# Patient Record
Sex: Male | Born: 1965
Health system: Southern US, Community
[De-identification: ages and names within clinical notes are randomized; demographics above are authoritative.]

## PROBLEM LIST (undated history)

## (undated) DIAGNOSIS — F329 Major depressive disorder, single episode, unspecified: Secondary | ICD-10-CM

## (undated) DIAGNOSIS — M1990 Unspecified osteoarthritis, unspecified site: Secondary | ICD-10-CM

## (undated) DIAGNOSIS — F1910 Other psychoactive substance abuse, uncomplicated: Secondary | ICD-10-CM

## (undated) DIAGNOSIS — C801 Malignant (primary) neoplasm, unspecified: Secondary | ICD-10-CM

## (undated) DIAGNOSIS — Z9889 Other specified postprocedural states: Secondary | ICD-10-CM

## (undated) DIAGNOSIS — F319 Bipolar disorder, unspecified: Secondary | ICD-10-CM

## (undated) DIAGNOSIS — G9340 Encephalopathy, unspecified: Secondary | ICD-10-CM

## (undated) DIAGNOSIS — F1011 Alcohol abuse, in remission: Secondary | ICD-10-CM

## (undated) DIAGNOSIS — R112 Nausea with vomiting, unspecified: Secondary | ICD-10-CM

## (undated) DIAGNOSIS — D696 Thrombocytopenia, unspecified: Secondary | ICD-10-CM

## (undated) DIAGNOSIS — K469 Unspecified abdominal hernia without obstruction or gangrene: Secondary | ICD-10-CM

## (undated) DIAGNOSIS — E785 Hyperlipidemia, unspecified: Secondary | ICD-10-CM

## (undated) DIAGNOSIS — R569 Unspecified convulsions: Secondary | ICD-10-CM

## (undated) DIAGNOSIS — I8500 Esophageal varices without bleeding: Secondary | ICD-10-CM

## (undated) DIAGNOSIS — D649 Anemia, unspecified: Secondary | ICD-10-CM

## (undated) DIAGNOSIS — F32A Depression, unspecified: Secondary | ICD-10-CM

## (undated) DIAGNOSIS — S62109A Fracture of unspecified carpal bone, unspecified wrist, initial encounter for closed fracture: Secondary | ICD-10-CM

## (undated) HISTORY — DX: Wilson's disease: E83.01

## (undated) HISTORY — DX: Unspecified abdominal hernia without obstruction or gangrene: K46.9

## (undated) HISTORY — DX: Other psychoactive substance abuse, uncomplicated: F19.10

## (undated) HISTORY — DX: Alcohol abuse, in remission: F10.11

## (undated) HISTORY — DX: Hyperlipidemia, unspecified: E78.5

## (undated) HISTORY — PX: OTHER SURGICAL HISTORY: SHX169

## (undated) HISTORY — DX: Thrombocytopenia, unspecified: D69.6

## (undated) HISTORY — DX: Depression, unspecified: F32.A

## (undated) HISTORY — DX: Encephalopathy, unspecified: G93.40

## (undated) HISTORY — DX: Bipolar disorder, unspecified: F31.9

## (undated) HISTORY — DX: Major depressive disorder, single episode, unspecified: F32.9

## (undated) HISTORY — DX: Unspecified convulsions: R56.9

## (undated) HISTORY — PX: VASCULAR SURGERY: SHX849

## (undated) HISTORY — PX: ESOPHAGOGASTRODUODENOSCOPY: SHX1529

---

## 2007-12-24 ENCOUNTER — Emergency Department (HOSPITAL_COMMUNITY): Admission: EM | Admit: 2007-12-24 | Discharge: 2007-12-25 | Payer: Self-pay | Admitting: Emergency Medicine

## 2008-11-29 ENCOUNTER — Emergency Department (HOSPITAL_COMMUNITY): Admission: EM | Admit: 2008-11-29 | Discharge: 2008-11-29 | Payer: Self-pay | Admitting: Emergency Medicine

## 2009-06-10 DIAGNOSIS — K7460 Unspecified cirrhosis of liver: Secondary | ICD-10-CM | POA: Insufficient documentation

## 2009-06-10 DIAGNOSIS — K769 Liver disease, unspecified: Secondary | ICD-10-CM | POA: Insufficient documentation

## 2010-09-16 ENCOUNTER — Inpatient Hospital Stay (HOSPITAL_COMMUNITY)
Admission: EM | Admit: 2010-09-16 | Discharge: 2010-09-18 | Payer: Self-pay | Source: Home / Self Care | Attending: Internal Medicine | Admitting: Internal Medicine

## 2010-09-19 LAB — DIFFERENTIAL
Basophils Absolute: 0 10*3/uL (ref 0.0–0.1)
Basophils Absolute: 0 10*3/uL (ref 0.0–0.1)
Basophils Relative: 0 % (ref 0–1)
Basophils Relative: 0 % (ref 0–1)
Eosinophils Absolute: 0 10*3/uL (ref 0.0–0.7)
Eosinophils Absolute: 0.1 10*3/uL (ref 0.0–0.7)
Eosinophils Relative: 0 % (ref 0–5)
Eosinophils Relative: 1 % (ref 0–5)
Lymphocytes Relative: 12 % (ref 12–46)
Lymphocytes Relative: 5 % — ABNORMAL LOW (ref 12–46)
Lymphs Abs: 0.7 10*3/uL (ref 0.7–4.0)
Lymphs Abs: 1.2 10*3/uL (ref 0.7–4.0)
Monocytes Absolute: 1.2 10*3/uL — ABNORMAL HIGH (ref 0.1–1.0)
Monocytes Absolute: 1.6 10*3/uL — ABNORMAL HIGH (ref 0.1–1.0)
Monocytes Relative: 12 % (ref 3–12)
Monocytes Relative: 13 % — ABNORMAL HIGH (ref 3–12)
Neutro Abs: 11.1 10*3/uL — ABNORMAL HIGH (ref 1.7–7.7)
Neutro Abs: 7 10*3/uL (ref 1.7–7.7)
Neutrophils Relative %: 74 % (ref 43–77)
Neutrophils Relative %: 83 % — ABNORMAL HIGH (ref 43–77)

## 2010-09-19 LAB — CBC
HCT: 38.4 % — ABNORMAL LOW (ref 39.0–52.0)
HCT: 42.9 % (ref 39.0–52.0)
Hemoglobin: 13 g/dL (ref 13.0–17.0)
Hemoglobin: 14.6 g/dL (ref 13.0–17.0)
MCH: 33.9 pg (ref 26.0–34.0)
MCH: 34 pg (ref 26.0–34.0)
MCHC: 33.9 g/dL (ref 30.0–36.0)
MCHC: 34 g/dL (ref 30.0–36.0)
MCV: 100 fL (ref 78.0–100.0)
MCV: 100.3 fL — ABNORMAL HIGH (ref 78.0–100.0)
Platelets: 67 10*3/uL — ABNORMAL LOW (ref 150–400)
Platelets: 68 10*3/uL — ABNORMAL LOW (ref 150–400)
RBC: 3.83 MIL/uL — ABNORMAL LOW (ref 4.22–5.81)
RBC: 4.29 MIL/uL (ref 4.22–5.81)
RDW: 15.6 % — ABNORMAL HIGH (ref 11.5–15.5)
RDW: 15.7 % — ABNORMAL HIGH (ref 11.5–15.5)
WBC: 13.4 10*3/uL — ABNORMAL HIGH (ref 4.0–10.5)
WBC: 9.5 10*3/uL (ref 4.0–10.5)

## 2010-09-19 LAB — COMPREHENSIVE METABOLIC PANEL
ALT: 36 U/L (ref 0–53)
AST: 46 U/L — ABNORMAL HIGH (ref 0–37)
Albumin: 2.8 g/dL — ABNORMAL LOW (ref 3.5–5.2)
Alkaline Phosphatase: 111 U/L (ref 39–117)
BUN: 9 mg/dL (ref 6–23)
CO2: 24 mEq/L (ref 19–32)
Calcium: 9.5 mg/dL (ref 8.4–10.5)
Chloride: 106 mEq/L (ref 96–112)
Creatinine, Ser: 0.83 mg/dL (ref 0.4–1.5)
GFR calc Af Amer: >60 mL/min (ref >60)
GFR calc non Af Amer: >60 mL/min (ref >60)
Glucose, Bld: 112 mg/dL — ABNORMAL HIGH (ref 70–99)
Potassium: 3.9 mEq/L (ref 3.5–5.1)
Sodium: 137 mEq/L (ref 135–145)
Total Bilirubin: 3.9 mg/dL — ABNORMAL HIGH (ref 0.3–1.2)
Total Protein: 7.4 g/dL (ref 6.0–8.3)

## 2010-09-19 LAB — URINALYSIS, ROUTINE W REFLEX MICROSCOPIC
Hgb urine dipstick: NEGATIVE
Ketones, ur: 15 mg/dL — AB
Nitrite: POSITIVE — AB
Protein, ur: NEGATIVE mg/dL
Specific Gravity, Urine: 1.038 — ABNORMAL HIGH (ref 1.005–1.030)
Urine Glucose, Fasting: NEGATIVE mg/dL
Urobilinogen, UA: 1 mg/dL (ref 0.0–1.0)
pH: 6 (ref 5.0–8.0)

## 2010-09-19 LAB — URINE MICROSCOPIC-ADD ON

## 2010-09-19 LAB — BASIC METABOLIC PANEL
BUN: 10 mg/dL (ref 6–23)
CO2: 25 mEq/L (ref 19–32)
Calcium: 9 mg/dL (ref 8.4–10.5)
Chloride: 104 mEq/L (ref 96–112)
Creatinine, Ser: 0.9 mg/dL (ref 0.4–1.5)
GFR calc Af Amer: >60 mL/min (ref >60)
GFR calc non Af Amer: >60 mL/min (ref >60)
Glucose, Bld: 112 mg/dL — ABNORMAL HIGH (ref 70–99)
Potassium: 4.7 mEq/L (ref 3.5–5.1)
Sodium: 135 mEq/L (ref 135–145)

## 2010-09-19 LAB — PROTIME-INR
INR: 1.48 (ref 0.00–1.49)
Prothrombin Time: 18.1 seconds — ABNORMAL HIGH (ref 11.6–15.2)

## 2010-09-19 LAB — OCCULT BLOOD, POC DEVICE: Fecal Occult Bld: NEGATIVE

## 2010-09-19 LAB — LIPASE, BLOOD: Lipase: 40 U/L (ref 11–59)

## 2010-09-19 LAB — APTT: aPTT: 32 seconds (ref 24–37)

## 2010-09-23 NOTE — Discharge Summary (Signed)
[name], [name]                ACCOUNT NO.:  [id number]  MEDICAL RECORD NO.:  [id number]          PATIENT TYPE:  INP  LOCATION:  1345                         FACILITY:  [facility]  PHYSICIAN:  Ruthy Dick, MD    DATE OF BIRTH:  02-Sep-1966  DATE OF ADMISSION:  09/16/2010 DATE OF DISCHARGE:  09/18/2010                              DISCHARGE SUMMARY   PRIMARY CARE PHYSICIAN:  The patient does not have primary care physician in the area but the patient's gastroenterologist is in [facility] with the name Dr. Woodfin Ganja.  REASON FOR ADMISSION:  Abdominal pain, nausea, and vomiting.  FINAL DISCHARGE DIAGNOSES: 1. Abdominal pain, nausea and vomiting, likely secondary to ileus     versus incarcerated inguinal hernia. 2. Left inguinal hernia, reducible. 3. Ascites. 4. Wilson's disease. 5. Thrombocytopenia secondary to cirrhosis and a previous medical     treatment of penicillamine. 6. Seizure disorder. 7. Urinary tract infection.  PROCEDURES DONE DURING THIS ADMISSION:  CT scan of the abdomen and pelvis which showed cirrhosis with splenomegaly and significant ascites as well as presence of spontaneous splenorenal shunt, cholelithiasis, and left inguinal hernia.  Abdominal x-ray was read as having air-fluid levels in the nondilated small bowel in the right, questionable for local ileus.  CONSULTS DURING THIS ADMISSION:  None, specifically I offered this to have consult for Gastroenterology and also surgical consult but he refused, and said since his symptoms have improved that he will rather follow with his appointment at [facility], so at this admission, I did not go for this consult.  HISTORY OF PRESENT ILLNESS:  This is a 45 year old gentleman with a history of Wilson's disease, who presented to the Emergency Room with abdominal pain and vomiting.  There was also pain in the left inguinal hernia area.  The patient was admitted and was  treated empirically.  CT scan of the abdomen did not show any particular obstruction.  However, because there was a concern for incarceration of hernia, we offered to have Surgery see this patient, but again he refused because by this time his symptoms were much improved.  He also mentioned the fact that because of ascites, he is not going to be a good candidate for surgery which is what he has been told from [facility] [place] [place].  According to him, he is on the transplant list for his liver; and according to him, the idea was for him to have his ascites either reduced before going for surgery.  Again, as noted his symptoms have improved.  He is able to tolerate diet.  He has been having bowel movements and he has been very anxious to be discharged home.  He says he has no reason to remain in the hospital since he does not want to follow with any surgeon or gastroenterologist here.  Because of this, we feel that he is stable enough to be discharged home.  PHYSICAL EXAMINATION:  VITAL SIGNS:  Temperature is 98.5, pulse 72, respirations 16, blood pressure 105/60, saturating 94% on room air. CHEST:  Clear to auscultation bilaterally. ABDOMEN:  Slightly  distended but not tense.  Bowel sounds present. Ascites present. EXTREMITIES:  No clubbing, no signs, no edema. CARDIOVASCULAR:  Present first and second heart sounds only. CENTRAL NERVOUS SYSTEM:  Nonfocal.  DISCHARGE MEDICATIONS:  For this patient are: 1. Rifaximin 550 mg b.i.d. 2. Lactulose as instructed. 3. Keppra 500 mg b.i.d. 4. Seroquel 100 1 tablet in the morning and 150 mg at bedtime. 5. Aldactone 25 mg 4 tablets in the morning and 2 tablets afternoon. 6. Zinc 50 mg t.i.d. 7. Tylenol 500 mg q.6h. p.r.n. for pain. 8. Omeprazole 40 mg p.o. daily. 9. Augmentin 875 mg p.o. b.i.d. for 3 days.  Total time used for discharging this patient is 40 minutes.  Plan of care discussed at length with the patient and he  voices understand plans to comply.  He is to follow with the [facility] as scheduled.  According to him, this is in about 2 weeks.  He is also to follow with [facility] as also scheduled.  He also plans to call and confirm this appointment.  Total time used for discharging the patient is 40 minutes.  Prior to discharging, we are going to get a KUB of his abdomen and see whether his ileus is resolved as image-wise, otherwise in terms of his symptoms, he has no symptoms whatsoever.  No abdominal pain, no nausea, no vomiting, no diarrhea.  He is eating and tolerating his diet and is having bowel movement.     Ruthy Dick, MD     GU/MEDQ  D:  09/18/2010  T:  09/18/2010  Job:  [id number]  cc:   Dr. Woodfin Ganja [facility].  Electronically Signed by Ruthy Dick  on 09/22/2010 06:54:26 PM

## 2010-11-27 ENCOUNTER — Inpatient Hospital Stay (HOSPITAL_COMMUNITY)
Admission: EM | Admit: 2010-11-27 | Discharge: 2010-11-29 | DRG: 100 | Disposition: A | Discharge status: Home / Self Care | Payer: Medicare HMO | Attending: Internal Medicine | Admitting: Internal Medicine

## 2010-11-27 DIAGNOSIS — E7220 Disorder of urea cycle metabolism, unspecified: Secondary | ICD-10-CM | POA: Diagnosis present

## 2010-11-27 DIAGNOSIS — K7682 Hepatic encephalopathy: Secondary | ICD-10-CM | POA: Diagnosis present

## 2010-11-27 DIAGNOSIS — G40802 Other epilepsy, not intractable, without status epilepticus: Principal | ICD-10-CM | POA: Diagnosis present

## 2010-11-27 DIAGNOSIS — K7290 Hepatic failure, unspecified without coma: Secondary | ICD-10-CM | POA: Diagnosis present

## 2010-11-27 DIAGNOSIS — Z91199 Patient's noncompliance with other medical treatment and regimen due to unspecified reason: Secondary | ICD-10-CM

## 2010-11-27 DIAGNOSIS — D696 Thrombocytopenia, unspecified: Secondary | ICD-10-CM | POA: Diagnosis present

## 2010-11-27 DIAGNOSIS — Z7682 Awaiting organ transplant status: Secondary | ICD-10-CM

## 2010-11-27 DIAGNOSIS — K769 Liver disease, unspecified: Secondary | ICD-10-CM | POA: Diagnosis present

## 2010-11-27 DIAGNOSIS — Z9119 Patient's noncompliance with other medical treatment and regimen: Secondary | ICD-10-CM

## 2010-11-27 DIAGNOSIS — F17200 Nicotine dependence, unspecified, uncomplicated: Secondary | ICD-10-CM | POA: Diagnosis present

## 2010-11-27 LAB — CBC
HCT: 42.1 % (ref 39.0–52.0)
Hemoglobin: 14.2 g/dL (ref 13.0–17.0)
MCH: 33.8 pg (ref 26.0–34.0)
MCHC: 33.7 g/dL (ref 30.0–36.0)
MCV: 100.2 fL — ABNORMAL HIGH (ref 78.0–100.0)
Platelets: 94 10*3/uL — ABNORMAL LOW (ref 150–400)
RBC: 4.2 MIL/uL — ABNORMAL LOW (ref 4.22–5.81)
RDW: 15.4 % (ref 11.5–15.5)
WBC: 9.2 10*3/uL (ref 4.0–10.5)

## 2010-11-27 LAB — DIFFERENTIAL
Basophils Absolute: 0.1 10*3/uL (ref 0.0–0.1)
Basophils Relative: 1 % (ref 0–1)
Eosinophils Absolute: 0.1 10*3/uL (ref 0.0–0.7)
Eosinophils Relative: 1 % (ref 0–5)
Lymphocytes Relative: 22 % (ref 12–46)
Lymphs Abs: 2 10*3/uL (ref 0.7–4.0)
Monocytes Absolute: 1.1 10*3/uL — ABNORMAL HIGH (ref 0.1–1.0)
Monocytes Relative: 12 % (ref 3–12)
Neutro Abs: 5.9 10*3/uL (ref 1.7–7.7)
Neutrophils Relative %: 64 % (ref 43–77)

## 2010-11-27 LAB — COMPREHENSIVE METABOLIC PANEL
ALT: 37 U/L (ref 0–53)
AST: 55 U/L — ABNORMAL HIGH (ref 0–37)
Albumin: 3 g/dL — ABNORMAL LOW (ref 3.5–5.2)
Alkaline Phosphatase: 109 U/L (ref 39–117)
BUN: 11 mg/dL (ref 6–23)
CO2: 14 mEq/L — ABNORMAL LOW (ref 19–32)
Calcium: 9.4 mg/dL (ref 8.4–10.5)
Chloride: 108 mEq/L (ref 96–112)
Creatinine, Ser: 0.82 mg/dL (ref 0.4–1.5)
GFR calc Af Amer: >60 mL/min (ref >60)
GFR calc non Af Amer: >60 mL/min (ref >60)
Glucose, Bld: 106 mg/dL — ABNORMAL HIGH (ref 70–99)
Potassium: 4.1 mEq/L (ref 3.5–5.1)
Sodium: 137 mEq/L (ref 135–145)
Total Bilirubin: 3.1 mg/dL — ABNORMAL HIGH (ref 0.3–1.2)
Total Protein: 7.1 g/dL (ref 6.0–8.3)

## 2010-11-27 LAB — AMMONIA: Ammonia: 243 umol/L — ABNORMAL HIGH (ref 11–35)

## 2010-11-28 LAB — APTT: aPTT: 34 seconds (ref 24–37)

## 2010-11-28 LAB — BASIC METABOLIC PANEL
BUN: 9 mg/dL (ref 6–23)
CO2: 24 mEq/L (ref 19–32)
Calcium: 8.5 mg/dL (ref 8.4–10.5)
Chloride: 108 mEq/L (ref 96–112)
Creatinine, Ser: 0.81 mg/dL (ref 0.4–1.5)
GFR calc Af Amer: >60 mL/min (ref >60)
GFR calc non Af Amer: >60 mL/min (ref >60)
Glucose, Bld: 92 mg/dL (ref 70–99)
Potassium: 3.4 mEq/L — ABNORMAL LOW (ref 3.5–5.1)
Sodium: 137 mEq/L (ref 135–145)

## 2010-11-28 LAB — TSH: TSH: 0.821 u[IU]/mL (ref 0.350–4.500)

## 2010-11-28 LAB — PROTIME-INR
INR: 1.67 — ABNORMAL HIGH (ref 0.00–1.49)
Prothrombin Time: 19.9 seconds — ABNORMAL HIGH (ref 11.6–15.2)

## 2010-11-28 LAB — MAGNESIUM: Magnesium: 1.8 mg/dL (ref 1.5–2.5)

## 2010-11-28 LAB — AMMONIA: Ammonia: 78 umol/L — ABNORMAL HIGH (ref 11–35)

## 2010-11-29 LAB — BASIC METABOLIC PANEL
BUN: 7 mg/dL (ref 6–23)
CO2: 27 mEq/L (ref 19–32)
Calcium: 9 mg/dL (ref 8.4–10.5)
Chloride: 109 mEq/L (ref 96–112)
Creatinine, Ser: 0.85 mg/dL (ref 0.4–1.5)
GFR calc Af Amer: >60 mL/min (ref >60)
GFR calc non Af Amer: >60 mL/min (ref >60)
Glucose, Bld: 94 mg/dL (ref 70–99)
Potassium: 4.3 mEq/L (ref 3.5–5.1)
Sodium: 140 mEq/L (ref 135–145)

## 2010-11-29 LAB — MAGNESIUM: Magnesium: 1.8 mg/dL (ref 1.5–2.5)

## 2010-11-29 LAB — AMMONIA: Ammonia: 35 umol/L (ref 11–35)

## 2010-12-04 NOTE — Discharge Summary (Signed)
[name], [name]                ACCOUNT NO.:  [id number]  MEDICAL RECORD NO.:  [id number]           PATIENT TYPE:  I  LOCATION:  4737                         FACILITY:  [facility]  PHYSICIAN:  Baltazar Najjar, MD     DATE OF BIRTH:  10/06/1965  DATE OF ADMISSION:  11/27/2010 DATE OF DISCHARGE:                              DISCHARGE SUMMARY   FINAL DISCHARGE DIAGNOSES: 1. Seizures. 2. History of seizure disorder. 3. Hepatic encephalopathy. 4. History of Wilson disease. 5. Chronic thrombocytopenia.  CONSULTATION:  [facility].  RADIOLOGY DATA:  None.  BRIEF ADMITTING HISTORY:  Please refer to dictated H and P for more details.  On summary [name] is a 45 year old man with history of Wilson disease and seizure disorder.  He also has history of hepatic failure, follow up at [facility] was brought into the hospital for seizure and confusion.  Apparently, the patient stopped taking his medication for no specific reason.  He denies any financial difficulty or filling his medication.  However, he was noncompliant with his medicine and that precipitated the seizure episode and hyperammonemia.  HOSPITAL COURSE:  In the ED, the patient was found to have ammonia level greater than 200.  He was admitted to the hospital with a diagnosis of seizure and hepatic encephalopathy.  The patient was resumed back on his medicine including lactulose 30 mL q.6 h. and his Keppra and his seizure resolved and his ammonia level came down with lactulose and his last level was 35.  He was also continued on his rifaximin along with lactulose during this hospitalization.  The patient's mental status significantly improved.  He is alert and oriented x3 today.  No flapping tremor noted.  He is stable for discharge to follow with his hepatologist at [facility] as previously arranged.  DISCHARGE MEDICATIONS: 1. Lactulose 10 g/15 mL 30 mL p.o. q.6 h. 2. Lasix 40 mg p.o.  q.a.m. 3. Keppra 500 mg p.o. b.i.d. 4. Milk Thistle 100 mg over-the-counter 1 tablet p.o. q.a.m. 5. Omeprazole 40 mg 1 capsule p.o. daily as needed. 6. Propranolol 10 mg p.o. b.i.d. 7. Seroquel 100 mg 1-1/2 tablets p.o. twice daily. 8. Spironolactone 100 mg p.o. daily.  Instructions for him to take 150     mg, however given the fact that his blood pressure is running in     the low side.  I will discharge him only on 100 mg p.o. daily. 9. Rifaximin 550 mg p.o. twice daily. 10.Zinc 50 mg over-the-counter t.i.d.  DISCHARGE INSTRUCTIONS: 1. The patient counseled extensively with compliance of his     medication.  I explained to him the risk of not taking his medicine     including seizures and even death.  The patient verbalized     understanding. 2. The patient to follow with [facility] as arranged. 3. The patient to report any worsening of his symptom or any new     symptoms to his doctor or come back to the ER.  CONDITION ON DISCHARGE:  Improved.          ______________________________ Baltazar Najjar, MD  SA/MEDQ  D:  11/29/2010  T:  11/29/2010  Job:  [id number]  cc:   Dr. Woodfin Ganja  Electronically Signed by Hannah Beat MD on 12/04/2010 08:48:35 PM

## 2010-12-04 NOTE — Consult Note (Signed)
[name], TRIM                ACCOUNT NO.:  [id number]  MEDICAL RECORD NO.:  [id number]           PATIENT TYPE:  I  LOCATION:  4737                         FACILITY:  [facility]  PHYSICIAN:  Levie Heritage, MD       DATE OF BIRTH:  May 23, 1966  DATE OF CONSULTATION:  11/28/2010 DATE OF DISCHARGE:                                CONSULTATION   REASON OF CONSULTATION:  Seizure.  HISTORY OF PRESENT ILLNESS:  This is a 45 year old male with known Wilson disease, hepatic failure, seizure disorder, and history of EtOH abuse.  The patient is currently on the transplant list at [facility], but has not received a donor at this point in time.  The patient has been on lactulose for hyperammonemia in the past and states he sees Dr. Anne Hahn of [facility] for seizure disorder.  The patient has been on Keppra 500 mg b.i.d. for over 2 years and states that he has been doing well on this medication and has not issues with either sideeffects or financial issues.  Over the past a week or so, the patient has had a period in which he stopped taking his medication and became very confused.  He is unsure why he stopped taking his medication. However, it seems as though his ammonia level was rising which most likely caused confusion and then at that time his medications were not being taken as scheduled.  At some point on November 27, 2010, the patient had a seizure episode that was witnessed by a roommate and was brought to [facility] ED.  After being admitted to [facility] ED, the patient's ammonia level was found to be 243.  The patient had a second seizure while in [facility] ED that lasted approximately a minute.  Unfortunately, the patient cannot describe the seizures and no one is around at this time to describe what was witnessed.  The patient presently is alert and oriented and is back to his baseline and his ammonia has dropped significantly from 243 down to 78.  PAST MEDICAL HISTORY: 1. Wilson  disease. 2. Seizure disorder. 3. Hepatic failure. 4. EtOH beats.  MEDICATIONS:  While in the hospital, the patient has been placed back on his Keppra 500 mg b.i.d., Tylenol, lactulose, Lasix, omeprazole, spironolactone, Seroquel, and propranolol.  ALLERGIES:  No known drug allergies.  FAMILY HISTORY:  The patient has 5 siblings, one sister with Andrey Campanile disease and other siblings are stated to be carriers.  SOCIAL HISTORY:  The patient smokes half pack per day.  Does not drink alcohol.  Does not do illicit drugs.  REVIEW OF SYSTEMS:  Positive for distended abdomen, seizure disorder, confusion, and asterixis.  PHYSICAL EXAMINATION:  VITAL SIGNS:  Blood pressure is 95/57, pulse 65, respiratory rate 17, and temperature 98.6. NEUROLOGIC:  The patient is alert and oriented x3, carries out 2-3 step commands without any difficulty.  Pupils are equal, round, and reactive to light.  Conjugate gaze.  Extraocular movements are intact.  Visual fields grossly intact.  Face symmetrical.  Tongue is midline.  Uvula is midline.  The patient shows no dysarthria, aphasia, or slurred speech.  Facial sensation is full to pinprick and light touch.  Shoulder shrug and head turn was within normal limits.  Coordination, finger-to-nose and heel-to-shin were smooth.  Gait, the patient's gait is slow, but smooth.  Motor, the patient shows 4/5 strength throughout.  He does have slight asterixis when his hands are held out in extension, most likely secondary to his high ammonia levels.  Deep tendon reflexes 2+ throughout.  Downgoing toes.  Drift, the patient shows no drift in the upper lower extremities.  Sensation, the patient has sensations to pinprick, light touch, and vibration throughout and are intact. PULMONARY:  Clear to auscultation bilaterally.  No rhonchi or wheezing. CARDIOVASCULAR:  S1 and S2 is audible. NECK:  Negative bruits and supple.  LABORATORY DATA:  The patient's ammonia has dropped from  243-78.  PT is 19.9 and INR 1.67.  Sodium 137, potassium 3.4, chloride 108, CO2 is 24, BUN 9, creatinine 0.81, and glucose 92.  White blood cell count 9.2, platelets 94, hemoglobin 14.2, and hematocrit 42.1.  IMAGING:  No imaging has been done at this time.  ASSESSMENT:  This is a 45 year old male with known seizure disorder who has been doing well on Keppra 500 mg b.i.d.  The patient had a 1-week history of increasing confusion (high ammonia related due to CLD) which resulted in medical noncompliance. The patient arrived at the emergency department with a ammonia level of 243 which has now dropped to 78.  The patient had one seizure before admitting and one seizure while in the emergency department.  Most likely cause of seizure is non-compliance of antiepileptic medication in the system and high ammonia levels.  RECOMMENDATIONS: 1. Restart Keppra 500 mg b.i.d. IV or p.o.  Continue with treating     underlying hyperammonemia. 2. Follow up with Dr. Anne Hahn as an outpatient for continued     antiepileptic medication reconciliation.       Felicie Morn, PA-C  I have examined the patient and agree with above clinical findings.______________________________ Levie Heritage, MD    DS/MEDQ  D:  11/28/2010  T:  11/29/2010  Job:  [id number]  Electronically Signed by Felicie Morn PA-C on 12/01/2010 02:30:00 PM Electronically Signed by Levie Heritage MD on 12/04/2010 06:38:05 PM

## 2010-12-12 NOTE — H&P (Signed)
[name], [name]                ACCOUNT NO.:  [id number]  MEDICAL RECORD NO.:  [id number]           PATIENT TYPE:  E  LOCATION:  [facility]                         FACILITY:  [facility]  PHYSICIAN:  Homero Fellers, MD   DATE OF BIRTH:  [birth year]  DATE OF ADMISSION:  11/27/2010 DATE OF DISCHARGE:                             HISTORY & PHYSICAL   PRIMARY CARE PHYSICIAN:  Unassigned.  CHIEF COMPLAINTS:  Seizure and confusion.  HISTORY OF PRESENT ILLNESS:  This is a 45 year old Caucasian gentleman with known history of Wilson disease and hepatic failure currently on the transplant list at [facility].  He presented with seizure episode one at home this afternoon and one witnessed in the emergency room.  The seizure episode at home lasted about 3 minutes and one in the emergency room about 1 minute.  He was given Ativan to abort his seizure.  The patient has been confused for the past 2-3 days according to his friend who was also at the bedside and may not have been taking his Keppra and has lactulose as issued.  The patient also had some vomiting episodes once a day and a couple yesterday.  There is no abdominal pain, fever, cough, chest pain, shortness of breath, diarrhea, or constipation.  The patient's last seizure was about a month ago. Currently, the patient appeared to be less confused and actually able to engage in good conversation.  He has not eaten much today because of his vomiting.  His past medical history includes history of Wilson disease, history of seizure disorder, history of hepatic failure, history of previous alcohol abuse.  MEDICATIONS: 1. Tylenol. 2. Lactulose. 3. Lasix 40 mg daily. 4. Omeprazole 40 mg b.i.d. 5. Spironolactone 50 mg daily. 6. __________ frequency unspecified. 7. Propranolol. 8. Seroquel.  ALLERGIES:  None.  SOCIAL HISTORY:  Smokes half pack per day.  Does not drink now but used to drink heavily in the past.  No drug use.  FAMILY  HISTORY:  His sister died of Wilson disease few years ago.  Age unspecified.  No history of coronary artery disease in the family.  Ten- point review of systems is negative except as described above.  PHYSICAL EXAMINATION:  VITAL SIGNS:  Blood pressure 110/58 to 127/72, pulse 89, respiration 20, temperature is 98.4 degrees. GENERAL:  The patient is lying in bed comfortably in no distress. HEENT:  Pallor.  It is anicteric. NECK:  Supple.  No JVD, adenopathy, or thyromegaly. LUNGS:  Clear to auscultation bilaterally.  No wheezing or crackles. HEART:  S1 and S2.  Regular rate and rhythm.  No murmurs, rubs, or gallop. ABDOMEN:  Full, soft, nontender.  Bowel sounds present.  No masses. Questionable ascites. EXTREMITIES:  Trace edema. NEUROLOGIC:  The patient is awake, alert, and oriented x3.  Speech is clear. SKIN:  No rash or lesion.  LABORATORY:  Ammonia level 243.  Sodium is 137, potassium 4.1, BUN 11, creatinine 0.82.  Liver enzymes, AST 55, AST 37, total bilirubin is 3.1, which is elevated.  CBC, white count is 9.2, hemoglobin is 14.2, platelet count is 94,000.  Coagulation profile is  pending.  IMAGING:  Unavailable at this time.  ASSESSMENT: 1. Seizure episodes likely from poor compliance with medication     because of confusion. 2. Hepatic encephalopathy in a patient with history of liver failure     and wheezing disease. 3. Tobacco abuse.  PLAN:  Admit to telemetry.  The patient will be on p.o. lactulose since he is currently awake and able to take by mouth.  He was also given Zofran for his vomiting.  I will also put him on Keppra 750 mg IV loading dose, then 250 mg p.o. b.i.d.  I have discussed with neurology team who will see the patient tomorrow.  I will also order an EEG.  His condition at this time appears fairly stable.     Homero Fellers, MD     FA/MEDQ  D:  11/27/2010  T:  11/27/2010  Job:  [id number]  Electronically Signed by Homero Fellers  on  12/12/2010 09:13:58 PM

## 2010-12-15 LAB — COMPREHENSIVE METABOLIC PANEL
ALT: 62 U/L — ABNORMAL HIGH (ref 0–53)
AST: 48 U/L — ABNORMAL HIGH (ref 0–37)
Albumin: 3.4 g/dL — ABNORMAL LOW (ref 3.5–5.2)
Alkaline Phosphatase: 86 U/L (ref 39–117)
BUN: 9 mg/dL (ref 6–23)
CO2: 26 mEq/L (ref 19–32)
Calcium: 9.1 mg/dL (ref 8.4–10.5)
Chloride: 108 mEq/L (ref 96–112)
Creatinine, Ser: 0.91 mg/dL (ref 0.4–1.5)
GFR calc Af Amer: >60 mL/min (ref >60)
GFR calc non Af Amer: >60 mL/min (ref >60)
Glucose, Bld: 83 mg/dL (ref 70–99)
Potassium: 3.7 mEq/L (ref 3.5–5.1)
Sodium: 140 mEq/L (ref 135–145)
Total Bilirubin: 1.1 mg/dL (ref 0.3–1.2)
Total Protein: 6 g/dL (ref 6.0–8.3)

## 2010-12-15 LAB — URINALYSIS, ROUTINE W REFLEX MICROSCOPIC
Bilirubin Urine: NEGATIVE
Glucose, UA: NEGATIVE mg/dL
Hgb urine dipstick: NEGATIVE
Ketones, ur: NEGATIVE mg/dL
Nitrite: NEGATIVE
Protein, ur: NEGATIVE mg/dL
Specific Gravity, Urine: 1.023 (ref 1.005–1.030)
Urobilinogen, UA: 1 mg/dL (ref 0.0–1.0)
pH: 6 (ref 5.0–8.0)

## 2010-12-15 LAB — POCT CARDIAC MARKERS
CKMB, poc: <1 ng/mL — ABNORMAL LOW (ref 1.0–8.0)
Myoglobin, poc: 164 ng/mL (ref 12–200)
Troponin i, poc: <0.05 ng/mL (ref 0.00–0.09)

## 2010-12-15 LAB — CBC
HCT: 39.8 % (ref 39.0–52.0)
Hemoglobin: 13.5 g/dL (ref 13.0–17.0)
MCHC: 33.8 g/dL (ref 30.0–36.0)
MCV: 92.2 fL (ref 78.0–100.0)
Platelets: 43 10*3/uL — CL (ref 150–400)
RBC: 4.32 MIL/uL (ref 4.22–5.81)
RDW: 15 % (ref 11.5–15.5)
WBC: 4.1 10*3/uL (ref 4.0–10.5)

## 2011-01-16 ENCOUNTER — Ambulatory Visit: Payer: Medicare HMO | Admitting: Internal Medicine

## 2011-01-23 ENCOUNTER — Ambulatory Visit: Payer: Medicare HMO | Admitting: Internal Medicine

## 2011-01-26 ENCOUNTER — Ambulatory Visit: Payer: Medicare HMO | Admitting: Internal Medicine

## 2011-02-03 HISTORY — PX: OTHER SURGICAL HISTORY: SHX169

## 2011-02-16 ENCOUNTER — Ambulatory Visit: Payer: Medicare HMO | Admitting: Internal Medicine

## 2011-02-23 ENCOUNTER — Ambulatory Visit: Payer: Medicare HMO | Admitting: Internal Medicine

## 2011-02-23 DIAGNOSIS — Z09 Encounter for follow-up examination after completed treatment for conditions other than malignant neoplasm: Secondary | ICD-10-CM | POA: Insufficient documentation

## 2011-04-14 ENCOUNTER — Encounter: Payer: Self-pay | Admitting: Internal Medicine

## 2011-04-14 ENCOUNTER — Ambulatory Visit (INDEPENDENT_AMBULATORY_CARE_PROVIDER_SITE_OTHER): Payer: Medicare HMO | Admitting: Internal Medicine

## 2011-04-14 VITALS — BP 90/62 | HR 59 | Temp 97.3°F | Ht 72.0 in | Wt 178.2 lb

## 2011-04-14 DIAGNOSIS — F319 Bipolar disorder, unspecified: Secondary | ICD-10-CM | POA: Insufficient documentation

## 2011-04-14 DIAGNOSIS — Z0000 Encounter for general adult medical examination without abnormal findings: Secondary | ICD-10-CM

## 2011-04-14 NOTE — Patient Instructions (Signed)
Continue all other medications as before Please return in 6 mo with Lab testing done 3-5 days before  

## 2011-04-15 ENCOUNTER — Encounter: Payer: Self-pay | Admitting: Internal Medicine

## 2011-04-15 DIAGNOSIS — R569 Unspecified convulsions: Secondary | ICD-10-CM | POA: Insufficient documentation

## 2011-04-15 DIAGNOSIS — F329 Major depressive disorder, single episode, unspecified: Secondary | ICD-10-CM | POA: Insufficient documentation

## 2011-04-15 DIAGNOSIS — D696 Thrombocytopenia, unspecified: Secondary | ICD-10-CM | POA: Insufficient documentation

## 2011-04-15 DIAGNOSIS — G9340 Encephalopathy, unspecified: Secondary | ICD-10-CM | POA: Insufficient documentation

## 2011-04-15 DIAGNOSIS — K7460 Unspecified cirrhosis of liver: Secondary | ICD-10-CM | POA: Insufficient documentation

## 2011-04-15 DIAGNOSIS — F1910 Other psychoactive substance abuse, uncomplicated: Secondary | ICD-10-CM | POA: Insufficient documentation

## 2011-04-15 DIAGNOSIS — F1011 Alcohol abuse, in remission: Secondary | ICD-10-CM | POA: Insufficient documentation

## 2011-04-15 NOTE — Assessment & Plan Note (Addendum)
Overall doing well, age appropriate education and counseling updated, referrals for preventative services and immunizations addressed, dietary and smoking counseling addressed, most recent labs and ECG reviewed.  I have personally reviewed and have noted: 1) the patient's medical and social history 2) The pt's use of alcohol, tobacco, and illicit drugs 3) The patient's current medications and supplements 4) Functional ability including ADL's, fall risk, home safety risk, hearing and visual impairment 5) Diet and physical activities 6) Evidence for depression or mood disorder 7) The patient's height, weight, and BMI have been recorded in the chart Declines immunizations I have made referrals, and provided counseling and education based on review of the above

## 2011-04-15 NOTE — Progress Notes (Signed)
Subjective:    Patient ID: [name], male    DOB: 1966-03-15, 45 y.o.   MRN: [id number]  HPI Here for wellness and f/u;  Overall doing ok;  Pt denies CP, worsening SOB, DOE, wheezing, orthopnea, PND, worsening LE edema, palpitations, dizziness or syncope.  Pt denies neurological change such as new Headache, facial or extremity weakness.  Pt denies polydipsia, polyuria, or low sugar symptoms. Pt states overall good compliance with treatment and medications, good tolerability, and trying to follow lower cholesterol diet.  Pt denies worsening depressive symptoms, suicidal ideation or panic. No fever, wt loss, night sweats, loss of appetite, or other constitutional symptoms.  Pt states good ability with ADL's, low fall risk, home safety reviewed and adequate, no significant changes in hearing or vision, and occasionally active with exercise. No acute complaints Past Medical History  Diagnosis Date  . UTI (lower urinary tract infection)   . Hernia   . Bipolar affective disorder     [facility]  . Depression   . Seizures     Dr Anne Hahn  . Abuse, drug or alcohol   . Wilson disease   . Encephalopathy   . Thrombocytopenia   . Chronic liver disease and cirrhosis   . History of alcohol abuse    Past Surgical History  Procedure Date  . Left inguinal hernia june 2012    [facility] [place]    reports that he has been smoking.  He does not have any smokeless tobacco history on file. He reports that he does not drink alcohol or use illicit drugs. family history includes Heart disease in his other. Allergies  Allergen Reactions  . Codeine    No current outpatient prescriptions on file prior to visit.   Review of Systems Review of Systems  Constitutional: Negative for diaphoresis, activity change, appetite change and unexpected weight change.  HENT: Negative for hearing loss, ear pain, facial swelling, mouth sores and neck stiffness.   Eyes: Negative for pain, redness and visual  disturbance.  Respiratory: Negative for shortness of breath and wheezing.   Cardiovascular: Negative for chest pain and palpitations.  Gastrointestinal: Negative for diarrhea, blood in stool, abdominal distention and rectal pain.  Genitourinary: Negative for hematuria, flank pain and decreased urine volume.  Musculoskeletal: Negative for myalgias and joint swelling.  Skin: Negative for color change and wound.  Neurological: Negative for syncope and numbness.  Hematological: Negative for adenopathy.  Psychiatric/Behavioral: Negative for hallucinations, self-injury, decreased concentration and agitation.      Objective:   Physical Exam BP 90/62  Pulse 59  Temp(Src) 97.3 F (36.3 C) (Oral)  Ht 6' (1.829 m)  Wt 178 lb 4 oz (80.854 kg)  BMI 24.18 kg/m2  SpO2 99% Physical Exam  VS noted Constitutional: Pt is oriented to person, place, and time. Appears well-developed and well-nourished.  HENT:  Head: Normocephalic and atraumatic.  Right Ear: External ear normal.  Left Ear: External ear normal.  Nose: Nose normal.  Mouth/Throat: Oropharynx is clear and moist.  Eyes: Conjunctivae and EOM are normal. Pupils are equal, round, and reactive to light.  Neck: Normal range of motion. Neck supple. No JVD present. No tracheal deviation present.  Cardiovascular: Normal rate, regular rhythm, normal heart sounds and intact distal pulses.   Pulmonary/Chest: Effort normal and breath sounds normal.  Abdominal: Soft. Bowel sounds are normal. There is no tenderness.  Musculoskeletal: Normal range of motion. Exhibits no edema.  Lymphadenopathy:  Has no cervical adenopathy.  Neurological: Pt is  alert and oriented to person, place, and time. Pt has normal reflexes. No cranial nerve deficit.  Skin: Skin is warm and dry. No rash noted.  Psychiatric:  Has  normal mood and affect. Behavior is normal.        Assessment & Plan:

## 2011-05-30 LAB — DIFFERENTIAL
Basophils Absolute: 0
Basophils Relative: 1
Eosinophils Absolute: 0
Eosinophils Relative: 1
Lymphocytes Relative: 20
Lymphs Abs: 0.9
Monocytes Absolute: 0.5
Monocytes Relative: 11
Neutro Abs: 3
Neutrophils Relative %: 68

## 2011-05-30 LAB — CBC
HCT: 39.3
Hemoglobin: 13.5
MCHC: 34.3
MCV: 90.3
Platelets: 55 — ABNORMAL LOW
RBC: 4.35
RDW: 14.8
WBC: 4.4

## 2011-05-30 LAB — COMPREHENSIVE METABOLIC PANEL
ALT: 58 — ABNORMAL HIGH
AST: 42 — ABNORMAL HIGH
Albumin: 3.8
Alkaline Phosphatase: 76
BUN: 11
CO2: 28
Calcium: 9.3
Chloride: 104
Creatinine, Ser: 0.87
GFR calc Af Amer: >60
GFR calc non Af Amer: >60
Glucose, Bld: 92
Potassium: 4.3
Sodium: 139
Total Bilirubin: 1.3 — ABNORMAL HIGH
Total Protein: 6.9

## 2011-05-30 LAB — APTT: aPTT: 33

## 2011-05-30 LAB — POCT CARDIAC MARKERS
CKMB, poc: <1 — ABNORMAL LOW
Myoglobin, poc: 135
Operator id: 1192
Troponin i, poc: <0.05

## 2011-05-30 LAB — RAPID URINE DRUG SCREEN, HOSP PERFORMED
Amphetamines: NOT DETECTED
Barbiturates: NOT DETECTED
Benzodiazepines: NOT DETECTED
Cocaine: NOT DETECTED
Opiates: NOT DETECTED
Tetrahydrocannabinol: NOT DETECTED

## 2011-05-30 LAB — ETHANOL: Alcohol, Ethyl (B): <5

## 2011-05-30 LAB — PROTIME-INR
INR: 1.2
Prothrombin Time: 15.1

## 2011-05-30 LAB — D-DIMER, QUANTITATIVE: D-Dimer, Quant: 0.4

## 2011-08-03 DIAGNOSIS — K766 Portal hypertension: Secondary | ICD-10-CM | POA: Insufficient documentation

## 2011-08-03 DIAGNOSIS — I8500 Esophageal varices without bleeding: Secondary | ICD-10-CM | POA: Insufficient documentation

## 2011-08-03 DIAGNOSIS — K319 Disease of stomach and duodenum, unspecified: Secondary | ICD-10-CM | POA: Insufficient documentation

## 2011-08-05 ENCOUNTER — Emergency Department (HOSPITAL_COMMUNITY): Payer: Medicare HMO

## 2011-08-05 ENCOUNTER — Encounter (HOSPITAL_COMMUNITY): Payer: Self-pay | Admitting: Neurology

## 2011-08-05 ENCOUNTER — Inpatient Hospital Stay (HOSPITAL_COMMUNITY)
Admission: EM | Admit: 2011-08-05 | Discharge: 2011-08-06 | DRG: 443 | Disposition: A | Discharge status: Home / Self Care | Payer: Medicare HMO | Attending: Internal Medicine | Admitting: Internal Medicine

## 2011-08-05 DIAGNOSIS — F329 Major depressive disorder, single episode, unspecified: Secondary | ICD-10-CM

## 2011-08-05 DIAGNOSIS — D696 Thrombocytopenia, unspecified: Secondary | ICD-10-CM | POA: Diagnosis present

## 2011-08-05 DIAGNOSIS — R569 Unspecified convulsions: Secondary | ICD-10-CM

## 2011-08-05 DIAGNOSIS — K7460 Unspecified cirrhosis of liver: Secondary | ICD-10-CM | POA: Diagnosis present

## 2011-08-05 DIAGNOSIS — G9340 Encephalopathy, unspecified: Secondary | ICD-10-CM

## 2011-08-05 DIAGNOSIS — Z0000 Encounter for general adult medical examination without abnormal findings: Secondary | ICD-10-CM

## 2011-08-05 DIAGNOSIS — F1011 Alcohol abuse, in remission: Secondary | ICD-10-CM

## 2011-08-05 DIAGNOSIS — K7682 Hepatic encephalopathy: Principal | ICD-10-CM | POA: Diagnosis present

## 2011-08-05 DIAGNOSIS — K7290 Hepatic failure, unspecified without coma: Principal | ICD-10-CM

## 2011-08-05 DIAGNOSIS — K769 Liver disease, unspecified: Secondary | ICD-10-CM | POA: Diagnosis present

## 2011-08-05 DIAGNOSIS — Z79899 Other long term (current) drug therapy: Secondary | ICD-10-CM

## 2011-08-05 DIAGNOSIS — F32A Depression, unspecified: Secondary | ICD-10-CM

## 2011-08-05 DIAGNOSIS — F319 Bipolar disorder, unspecified: Secondary | ICD-10-CM

## 2011-08-05 DIAGNOSIS — R4182 Altered mental status, unspecified: Secondary | ICD-10-CM

## 2011-08-05 DIAGNOSIS — F17200 Nicotine dependence, unspecified, uncomplicated: Secondary | ICD-10-CM | POA: Diagnosis present

## 2011-08-05 LAB — URINALYSIS, ROUTINE W REFLEX MICROSCOPIC
Glucose, UA: NEGATIVE mg/dL
Hgb urine dipstick: NEGATIVE
Ketones, ur: 15 mg/dL — AB
Leukocytes, UA: NEGATIVE
Nitrite: NEGATIVE
Protein, ur: NEGATIVE mg/dL
Specific Gravity, Urine: 1.024 (ref 1.005–1.030)
Urobilinogen, UA: 2 mg/dL — ABNORMAL HIGH (ref 0.0–1.0)
pH: 6.5 (ref 5.0–8.0)

## 2011-08-05 LAB — CBC
HCT: 37.9 % — ABNORMAL LOW (ref 39.0–52.0)
Hemoglobin: 12.9 g/dL — ABNORMAL LOW (ref 13.0–17.0)
MCH: 32.7 pg (ref 26.0–34.0)
MCHC: 34 g/dL (ref 30.0–36.0)
MCV: 96.2 fL (ref 78.0–100.0)
Platelets: 36 10*3/uL — ABNORMAL LOW (ref 150–400)
RBC: 3.94 MIL/uL — ABNORMAL LOW (ref 4.22–5.81)
RDW: 14.7 % (ref 11.5–15.5)
WBC: 4 10*3/uL (ref 4.0–10.5)

## 2011-08-05 LAB — COMPREHENSIVE METABOLIC PANEL
ALT: 30 U/L (ref 0–53)
AST: 34 U/L (ref 0–37)
Albumin: 2.8 g/dL — ABNORMAL LOW (ref 3.5–5.2)
Alkaline Phosphatase: 112 U/L (ref 39–117)
BUN: 12 mg/dL (ref 6–23)
CO2: 24 mEq/L (ref 19–32)
Calcium: 9.4 mg/dL (ref 8.4–10.5)
Chloride: 110 mEq/L (ref 96–112)
Creatinine, Ser: 0.77 mg/dL (ref 0.50–1.35)
GFR calc Af Amer: >90 mL/min (ref >90)
GFR calc non Af Amer: >90 mL/min (ref >90)
Glucose, Bld: 94 mg/dL (ref 70–99)
Potassium: 3.9 mEq/L (ref 3.5–5.1)
Sodium: 139 mEq/L (ref 135–145)
Total Bilirubin: 1.8 mg/dL — ABNORMAL HIGH (ref 0.3–1.2)
Total Protein: 5.9 g/dL — ABNORMAL LOW (ref 6.0–8.3)

## 2011-08-05 LAB — DIFFERENTIAL
Basophils Absolute: 0 10*3/uL (ref 0.0–0.1)
Basophils Relative: 1 % (ref 0–1)
Eosinophils Absolute: 0.1 10*3/uL (ref 0.0–0.7)
Eosinophils Relative: 3 % (ref 0–5)
Lymphocytes Relative: 26 % (ref 12–46)
Lymphs Abs: 1 10*3/uL (ref 0.7–4.0)
Monocytes Absolute: 0.4 10*3/uL (ref 0.1–1.0)
Monocytes Relative: 11 % (ref 3–12)
Neutro Abs: 2.5 10*3/uL (ref 1.7–7.7)
Neutrophils Relative %: 59 % (ref 43–77)

## 2011-08-05 LAB — LACTIC ACID, PLASMA: Lactic Acid, Venous: 1.5 mmol/L (ref 0.5–2.2)

## 2011-08-05 LAB — LIPASE, BLOOD: Lipase: 68 U/L — ABNORMAL HIGH (ref 11–59)

## 2011-08-05 LAB — AMMONIA: Ammonia: 113 umol/L — ABNORMAL HIGH (ref 11–60)

## 2011-08-05 MED ORDER — SPIRONOLACTONE 50 MG PO TABS
150.0000 mg | ORAL_TABLET | Freq: Every day | ORAL | Status: DC
  Administered 2011-08-06: 150 mg via ORAL
  Filled 2011-08-05: qty 1

## 2011-08-05 MED ORDER — RIFAXIMIN 550 MG PO TABS
550.0000 mg | ORAL_TABLET | Freq: Two times a day (BID) | ORAL | Status: DC
  Administered 2011-08-06: 550 mg via ORAL
  Filled 2011-08-05 (×3): qty 1

## 2011-08-05 MED ORDER — FUROSEMIDE 40 MG PO TABS
60.0000 mg | ORAL_TABLET | Freq: Two times a day (BID) | ORAL | Status: DC
  Administered 2011-08-06 (×2): 60 mg via ORAL
  Filled 2011-08-05 (×2): qty 1
  Filled 2011-08-05: qty 3
  Filled 2011-08-05: qty 1

## 2011-08-05 MED ORDER — PROPRANOLOL HCL 10 MG PO TABS
10.0000 mg | ORAL_TABLET | Freq: Two times a day (BID) | ORAL | Status: DC
  Administered 2011-08-06 (×2): 10 mg via ORAL
  Filled 2011-08-05 (×3): qty 1

## 2011-08-05 MED ORDER — LEVETIRACETAM 500 MG PO TABS: 500.0000 mg | ORAL_TABLET | Freq: Two times a day (BID) | ORAL | Status: DC

## 2011-08-05 MED ORDER — SODIUM CHLORIDE 0.9 % IV SOLN
INTRAVENOUS | Status: AC
  Administered 2011-08-05: 10 mL/h via INTRAVENOUS

## 2011-08-05 MED ORDER — ZINC GLUCONATE 50 MG PO TABS: 50.0000 mg | ORAL_TABLET | Freq: Three times a day (TID) | ORAL | Status: DC

## 2011-08-05 MED ORDER — LACTULOSE 10 GM/15ML PO SOLN
30.0000 g | Freq: Once | ORAL | Status: AC
  Administered 2011-08-05: 30 g via ORAL
  Filled 2011-08-05: qty 45

## 2011-08-05 MED ORDER — LEVETIRACETAM 500 MG PO TABS
1000.0000 mg | ORAL_TABLET | Freq: Two times a day (BID) | ORAL | Status: DC
  Filled 2011-08-05 (×3): qty 2

## 2011-08-05 NOTE — ED Provider Notes (Signed)
History     CSN: [id number] Arrival date & time: 08/05/2011  6:52 PM   First MD Initiated Contact with Patient 08/05/11 1859      Chief Complaint  Patient presents with  . Altered Mental Status    (Consider location/radiation/quality/duration/timing/severity/associated sxs/prior treatment) Patient is a 45 y.o. male presenting with altered mental status. The history is provided by the patient. No language interpreter was used.  Altered Mental Status This is a new problem. The current episode started today. Associated symptoms include fatigue, a visual change and weakness. Pertinent negatives include no abdominal pain, chest pain, chills, congestion, coughing, diaphoresis, fever, headaches, nausea, numbness, rash, sore throat, swollen glands or vomiting. The treatment provided moderate relief.   Patient is here today from home where he has a caretaker. Caretaker reports that he is not taking his medications today and refusing. Has a history of Wilson's disease bipolar encephalopathy chronic liver cirrhosis alcohol abuse other diagnoses. He is not oriented as to place and time. Oriented to himself intermittently. Sclera appears below as well as the skin. Following commands pupils equal and reactive but vision seems to be blurred at this time.   Past Medical History  Diagnosis Date  . UTI (lower urinary tract infection)   . Hernia   . Bipolar affective disorder     [facility]  . Depression   . Seizures     Dr  Hahn  . Abuse, drug or alcohol   . Wilson disease   . Encephalopathy   . Thrombocytopenia   . Chronic liver disease and cirrhosis   . History of alcohol abuse     Past Surgical History  Procedure Date  . Left inguinal hernia june 2012    [facility]  . Vascular surgery     Family History  Problem Relation Age of Onset  . Heart disease Other     History  Substance Use Topics  . Smoking status: Current Everyday Smoker  . Smokeless tobacco: Not  on file  . Alcohol Use: No      Review of Systems  Unable to perform ROS Constitutional: Positive for fatigue. Negative for fever, chills and diaphoresis.  HENT: Negative for congestion and sore throat.   Respiratory: Negative for cough.   Cardiovascular: Negative for chest pain.  Gastrointestinal: Negative for nausea, vomiting and abdominal pain.  Skin: Negative for rash.  Neurological: Positive for weakness. Negative for numbness and headaches.  Psychiatric/Behavioral: Positive for altered mental status.    Allergies  Codeine  Home Medications   Current Outpatient Rx  Name Route Sig Dispense Refill  . FUROSEMIDE 20 MG PO TABS Oral Take 60 mg by mouth 2 (two) times daily.      [name] LACTULOSE 10 G PO PACK Oral Take 10 g by mouth 3 (three) times daily.      [name] MILK THISTLE 175 MG PO TABS Oral Take 175 mg by mouth 3 (three) times daily.     [name] OMEPRAZOLE 40 MG PO CPDR Oral Take 40 mg by mouth 2 (two) times daily.      [name] POTASSIUM GLUCONATE 595 MG PO TABS Oral Take 595 mg by mouth daily.      [name] PROPRANOLOL HCL 10 MG PO TABS Oral Take 10 mg by mouth 2 (two) times daily.     . QUETIAPINE FUMARATE 100 MG PO TABS Oral Take 100-150 mg by mouth 2 (two) times daily. Take 1 by mouth every morning and 1 and 1/2 by mouth  every evening    . RIFAXIMIN 550 MG PO TABS Oral Take 550 mg by mouth 2 (two) times daily.     [name] SPIRONOLACTONE 100 MG PO TABS Oral Take 150 mg by mouth daily.     [name] ZINC GLUCONATE 50 MG PO TABS Oral Take 50 mg by mouth 3 (three) times daily.     [name] LEVETIRACETAM 1000 MG PO TABS Oral Take 1,000 mg by mouth 2 (two) times daily.      [name] LEVETIRACETAM 500 MG PO TABS Oral Take 500 mg by mouth 2 (two) times daily.        BP 107/58  Pulse 61  Temp(Src) 97.5 F (36.4 C) (Oral)  Resp 18  SpO2 98%  Physical Exam  Nursing note and vitals reviewed. Constitutional: He appears well-developed and well-nourished. No distress.  Eyes: Pupils are equal, round, and reactive to light.    Neck: Normal range of motion.  Cardiovascular: Normal rate, normal heart sounds and intact distal pulses.   Pulmonary/Chest: Effort normal. No respiratory distress. He has no wheezes.  Abdominal: Soft. Bowel sounds are normal. He exhibits no distension and no mass. There is no tenderness. There is no rebound and no guarding.  Musculoskeletal: Normal range of motion. He exhibits no edema and no tenderness.  Neurological: He is alert. He has normal reflexes. A cranial nerve deficit is present. Coordination abnormal.       Disoriented PEARL follows commands. Otherwise neuro intact.  Skin: Skin is warm and dry. He is not diaphoretic.  Psychiatric:       Unable to answer simple yes and no questions.  Oriented to self intermitantly only.     ED Course  Procedures (including critical care time)  Labs Reviewed  CBC - Abnormal; Notable for the following:    RBC 3.94 (*)    Hemoglobin 12.9 (*)    HCT 37.9 (*)    Platelets 36 (*) PLATELET COUNT CONFIRMED BY SMEAR   All other components within normal limits  COMPREHENSIVE METABOLIC PANEL - Abnormal; Notable for the following:    Total Protein 5.9 (*)    Albumin 2.8 (*)    Total Bilirubin 1.8 (*)    All other components within normal limits  LIPASE, BLOOD - Abnormal; Notable for the following:    Lipase 68 (*)    All other components within normal limits  AMMONIA - Abnormal; Notable for the following:    Ammonia 113 (*)    All other components within normal limits  DIFFERENTIAL  LACTIC ACID, PLASMA  URINALYSIS, ROUTINE W REFLEX MICROSCOPIC   Dg Chest 2 View  08/05/2011  *RADIOLOGY REPORT*  Clinical Data: Altered mental status  CHEST - 2 VIEW  Comparison: 12/24/2007  Findings:The heart, mediastinal, and hilar contours are within normal limits and stable.  Low lung volumes.  Lungs appear clear. No acute bony abnormality.  IMPRESSION: Low lung volumes.  No acute findings.  Original Report Authenticated By: Britta Mccreedy, M.D.   Ct Head Wo  Contrast  08/05/2011  *RADIOLOGY REPORT*  Clinical Data: Altered mental status, history of Wilson's disease.  CT HEAD WITHOUT CONTRAST  Technique:  Contiguous axial images were obtained from the base of the skull through the vertex without contrast.  Comparison: 11/29/2008 CT  Findings: There is no evidence for acute infarction, intracranial hemorrhage, mass lesion, hydrocephalus, or extra-axial fluid. There is  mild premature cerebral and cerebellar atrophy.  The calvarium is intact.  Sinuses and mastoids are clear.  No change from  priors.  IMPRESSION: Mild atrophy.  No acute intracranial findings.  Original Report Authenticated By: Elsie Stain, M.D.     No diagnosis found.    MDM  Increasingly altered mental status through out the day today.  No taking his meds.  [facility] sent him here for evaluation.  Needs to be admitted for altered mental status.  Suspect encephalopathy due to his wilsons disease.    Labs Reviewed  CBC - Abnormal; Notable for the following:    RBC 3.94 (*)    Hemoglobin 12.9 (*)    HCT 37.9 (*)    Platelets 36 (*) PLATELET COUNT CONFIRMED BY SMEAR   All other components within normal limits  COMPREHENSIVE METABOLIC PANEL - Abnormal; Notable for the following:    Total Protein 5.9 (*)    Albumin 2.8 (*)    Total Bilirubin 1.8 (*)    All other components within normal limits  LIPASE, BLOOD - Abnormal; Notable for the following:    Lipase 68 (*)    All other components within normal limits  AMMONIA - Abnormal; Notable for the following:    Ammonia 113 (*)    All other components within normal limits  DIFFERENTIAL  LACTIC ACID, PLASMA  URINALYSIS, ROUTINE W REFLEX MICROSCOPIC         Jethro Bastos, NP 08/06/11 [phone]

## 2011-08-05 NOTE — ED Notes (Signed)
Pt is responsive, alert, oriented to person only. Pt response is inappropriate. Unable to answer questions. Pt skin is jaundiced. PT appears lethargic. Pt denying feeling any different today.

## 2011-08-05 NOTE — H&P (Signed)
DATE OF ADMISSION:  08/05/2011  PCP:    Oliver Barre, MD, MD   Chief Complaint:   HPI: [name] is an 45 y.o. male with a history of Wilson's Disease and Cirrhosis who was brought to the Emergency Department  Due to progressive decline noticed by his caretaker.  Patient had increased lethargy over the day and was unable to take his lactulose therapy.  He was brought to the emergency department for further evaluation and found to have an Elevated Ammonia level of 113.     Review of Systems: Unable to obtain from the patient.       Past Medical History  Diagnosis Date  . UTI (lower urinary tract infection)   . Hernia   . Bipolar affective disorder     [facility]  . Depression   . Seizures     Dr Anne Hahn  . Abuse, drug or alcohol   . Wilson disease   . Encephalopathy   . Thrombocytopenia   . Chronic liver disease and cirrhosis   . History of alcohol abuse     Past Surgical History  Procedure Date  . Left inguinal hernia june 2012    [facility]  . Vascular surgery     Medications:  HOME MEDS: Prior to Admission medications   Medication Sig Start Date End Date Taking? Authorizing Provider  furosemide (LASIX) 20 MG tablet Take 60 mg by mouth 2 (two) times daily.     Yes Historical Provider, MD  lactulose (CEPHULAC) 10 G packet Take 10 g by mouth 3 (three) times daily.     Yes Historical Provider, MD  milk thistle 175 MG tablet Take 175 mg by mouth 3 (three) times daily.    Yes Historical Provider, MD  omeprazole (PRILOSEC) 40 MG capsule Take 40 mg by mouth 2 (two) times daily.     Yes Historical Provider, MD  potassium gluconate 595 MG TABS Take 595 mg by mouth daily.     Yes Historical Provider, MD  propranolol (INDERAL) 10 MG tablet Take 10 mg by mouth 2 (two) times daily.    Yes Historical Provider, MD  QUEtiapine (SEROQUEL) 100 MG tablet Take 100-150 mg by mouth 2 (two) times daily. Take 1 by mouth every morning and 1 and 1/2 by mouth every  evening   Yes Historical Provider, MD  rifaximin (XIFAXAN) 550 MG TABS Take 550 mg by mouth 2 (two) times daily.    Yes Historical Provider, MD  spironolactone (ALDACTONE) 100 MG tablet Take 150 mg by mouth daily.    Yes Historical Provider, MD  zinc gluconate 50 MG tablet Take 50 mg by mouth 3 (three) times daily.    Yes Historical Provider, MD  levETIRAcetam (KEPPRA) 1000 MG tablet Take 1,000 mg by mouth 2 (two) times daily.      Historical Provider, MD  levETIRAcetam (KEPPRA) 500 MG tablet Take 500 mg by mouth 2 (two) times daily.      Historical Provider, MD    Allergies:  Allergies  Allergen Reactions  . Codeine     Social History:   reports that he has been smoking.  He does not have any smokeless tobacco history on file. He reports that he does not drink alcohol or use illicit drugs.  Family History: Family History  Problem Relation Age of Onset  . Heart disease Other      Physical Exam:  GEN:   45 year old well nourished and well developed Caucasian male  examined  and in no acute distress; cooperative with exam.   Filed Vitals:   08/05/11 1858 08/05/11 2151  BP: 110/53 107/58  Pulse: 62 61  Temp: 97.5 F (36.4 C) 97.5 F (36.4 C)  TempSrc: Oral Oral  Resp: 18 18  SpO2: 97% 98%   Blood pressure 107/58, pulse 61, temperature 97.5 F (36.4 C), temperature source Oral, resp. rate 18, SpO2 98.00%. PSYCH: He is alert and oriented x 1; does not appear anxious does not appear depressed; blank affect. HEENT: Normocephalic and Atraumatic, Mucous membranes pink; PERRLA; EOM intact; Fundi:  Benign;  No scleral icterus, Nares: Patent, Oropharynx: Clear, Fair Dentition, Neck:  FROM, no cervical lymphadenopathy nor thyromegaly or carotid bruit; no JVD; Breasts:  +Gynecomastia CHEST WALL: No tenderness CHEST: Normal respiration, clear to auscultation bilaterally HEART: Regular rate and rhythm; no murmurs rubs or gallops BACK: No kyphosis or scoliosis; no CVA tenderness ABDOMEN:  Positive Bowel Sounds, soft non-tender; no masses, no organomegaly. Rectal Exam: Not done EXTREMITIES: No bone or joint deformity; age-appropriate arthropathy of the hands and knees; no cyanosis, clubbing or edema; no ulcerations. Genitalia: not examined PULSES: 2+ and symmetric SKIN: Normal hydration no rash or ulceration CNS: Cranial nerves 2-12 grossly intact no focal neurologic deficit   Labs & Imaging Results for orders placed during the hospital encounter of 08/05/11 (from the past 48 hour(s))  CBC     Status: Abnormal   Collection Time   08/05/11  8:03 PM      Component Value Range Comment   WBC 4.0  4.0 - 10.5 (K/uL)    RBC 3.94 (*) 4.22 - 5.81 (MIL/uL)    Hemoglobin 12.9 (*) 13.0 - 17.0 (g/dL)    HCT 16.1 (*) 09.6 - 52.0 (%)    MCV 96.2  78.0 - 100.0 (fL)    MCH 32.7  26.0 - 34.0 (pg)    MCHC 34.0  30.0 - 36.0 (g/dL)    RDW 04.5  40.9 - 81.1 (%)    Platelets 36 (*) 150 - 400 (K/uL) PLATELET COUNT CONFIRMED BY SMEAR  DIFFERENTIAL     Status: Normal   Collection Time   08/05/11  8:03 PM      Component Value Range Comment   Neutrophils Relative 59  43 - 77 (%)    Lymphocytes Relative 26  12 - 46 (%)    Monocytes Relative 11  3 - 12 (%)    Eosinophils Relative 3  0 - 5 (%)    Basophils Relative 1  0 - 1 (%)    Neutro Abs 2.5  1.7 - 7.7 (K/uL)    Lymphs Abs 1.0  0.7 - 4.0 (K/uL)    Monocytes Absolute 0.4  0.1 - 1.0 (K/uL)    Eosinophils Absolute 0.1  0.0 - 0.7 (K/uL)    Basophils Absolute 0.0  0.0 - 0.1 (K/uL)    RBC Morphology ELLIPTOCYTES     COMPREHENSIVE METABOLIC PANEL     Status: Abnormal   Collection Time   08/05/11  8:03 PM      Component Value Range Comment   Sodium 139  135 - 145 (mEq/L)    Potassium 3.9  3.5 - 5.1 (mEq/L)    Chloride 110  96 - 112 (mEq/L)    CO2 24  19 - 32 (mEq/L)    Glucose, Bld 94  70 - 99 (mg/dL)    BUN 12  6 - 23 (mg/dL)    Creatinine, Ser 9.14  0.50 - 1.35 (  mg/dL)    Calcium 9.4  8.4 - 10.5 (mg/dL)    Total Protein 5.9 (*) 6.0 - 8.3  (g/dL)    Albumin 2.8 (*) 3.5 - 5.2 (g/dL)    AST 34  0 - 37 (U/L)    ALT 30  0 - 53 (U/L)    Alkaline Phosphatase 112  39 - 117 (U/L)    Total Bilirubin 1.8 (*) 0.3 - 1.2 (mg/dL)    GFR calc non Af Amer >90  >90 (mL/min)    GFR calc Af Amer >90  >90 (mL/min)   LIPASE, BLOOD     Status: Abnormal   Collection Time   08/05/11  8:03 PM      Component Value Range Comment   Lipase 68 (*) 11 - 59 (U/L)   LACTIC ACID, PLASMA     Status: Normal   Collection Time   08/05/11  8:04 PM      Component Value Range Comment   Lactic Acid, Venous 1.5  0.5 - 2.2 (mmol/L)   AMMONIA     Status: Abnormal   Collection Time   08/05/11  8:10 PM      Component Value Range Comment   Ammonia 113 (*) 11 - 60 (umol/L)   URINALYSIS, ROUTINE W REFLEX MICROSCOPIC     Status: Abnormal   Collection Time   08/05/11 10:32 PM      Component Value Range Comment   Color, Urine AMBER (*) YELLOW  BIOCHEMICALS MAY BE AFFECTED BY COLOR   APPearance CLOUDY (*) CLEAR     Specific Gravity, Urine 1.024  1.005 - 1.030     pH 6.5  5.0 - 8.0     Glucose, UA NEGATIVE  NEGATIVE (mg/dL)    Hgb urine dipstick NEGATIVE  NEGATIVE     Bilirubin Urine SMALL (*) NEGATIVE     Ketones, ur 15 (*) NEGATIVE (mg/dL)    Protein, ur NEGATIVE  NEGATIVE (mg/dL)    Urobilinogen, UA 2.0 (*) 0.0 - 1.0 (mg/dL)    Nitrite NEGATIVE  NEGATIVE     Leukocytes, UA NEGATIVE  NEGATIVE  MICROSCOPIC NOT DONE ON URINES WITH NEGATIVE PROTEIN, BLOOD, LEUKOCYTES, NITRITE, OR GLUCOSE <1000 mg/dL.   Dg Chest 2 View  08/05/2011  *RADIOLOGY REPORT*  Clinical Data: Altered mental status  CHEST - 2 VIEW  Comparison: 12/24/2007  Findings:The heart, mediastinal, and hilar contours are within normal limits and stable.  Low lung volumes.  Lungs appear clear. No acute bony abnormality.  IMPRESSION: Low lung volumes.  No acute findings.  Original Report Authenticated By: Britta Mccreedy, M.D.   Ct Head Wo Contrast  08/05/2011  *RADIOLOGY REPORT*  Clinical Data: Altered mental  status, history of Wilson's disease.  CT HEAD WITHOUT CONTRAST  Technique:  Contiguous axial images were obtained from the base of the skull through the vertex without contrast.  Comparison: 11/29/2008 CT  Findings: There is no evidence for acute infarction, intracranial hemorrhage, mass lesion, hydrocephalus, or extra-axial fluid. There is  mild premature cerebral and cerebellar atrophy.  The calvarium is intact.  Sinuses and mastoids are clear.  No change from priors.  IMPRESSION: Mild atrophy.  No acute intracranial findings.  Original Report Authenticated By: Elsie Stain, M.D.      Assessment/Plan: Present on Admission:  .Hepatic encephalopathy .Wilson disease .Thrombocytopenia .Chronic liver disease and cirrhosis .Encephalopathy   Plan:   23 Hour Observation Status, Administer lactulose PO and reconcile medications, monitor and perform neurologic checks.  NPO if further decline  or obtundation.  SCDs for DVT prophylaxis.  Other plans as per orders.    CODE STATUS:      FULL CODE      Yomara Toothman C 08/05/2011, 11:17 PM

## 2011-08-05 NOTE — ED Notes (Signed)
PER EMS- Pt comes from home, altered mental status since today per caregiver. Follows commands, easily distractable.  HX of Wilson's Disease usually seen at [facility]. Normally has no deficits, normally alert and oriented x 4. CBG 91, Stroke screen neg, NSR-64, 124/72, 100% RA

## 2011-08-05 NOTE — ED Notes (Signed)
EMS reports per pt caregiver, pt hasn't had any of his medications today.

## 2011-08-05 NOTE — ED Notes (Signed)
Patient transported to X-ray 

## 2011-08-06 ENCOUNTER — Inpatient Hospital Stay (HOSPITAL_COMMUNITY): Payer: Medicare HMO

## 2011-08-06 ENCOUNTER — Encounter (HOSPITAL_COMMUNITY): Payer: Self-pay | Admitting: *Deleted

## 2011-08-06 LAB — BASIC METABOLIC PANEL
BUN: 12 mg/dL (ref 6–23)
CO2: 23 mEq/L (ref 19–32)
Calcium: 9.3 mg/dL (ref 8.4–10.5)
Chloride: 107 mEq/L (ref 96–112)
Creatinine, Ser: 0.71 mg/dL (ref 0.50–1.35)
GFR calc Af Amer: >90 mL/min (ref >90)
GFR calc non Af Amer: >90 mL/min (ref >90)
Glucose, Bld: 82 mg/dL (ref 70–99)
Potassium: 4 mEq/L (ref 3.5–5.1)
Sodium: 137 mEq/L (ref 135–145)

## 2011-08-06 LAB — CBC
HCT: 37 % — ABNORMAL LOW (ref 39.0–52.0)
Hemoglobin: 12.6 g/dL — ABNORMAL LOW (ref 13.0–17.0)
MCH: 33.1 pg (ref 26.0–34.0)
MCHC: 34.1 g/dL (ref 30.0–36.0)
MCV: 97.1 fL (ref 78.0–100.0)
Platelets: 33 10*3/uL — ABNORMAL LOW (ref 150–400)
RBC: 3.81 MIL/uL — ABNORMAL LOW (ref 4.22–5.81)
RDW: 14.9 % (ref 11.5–15.5)
WBC: 3.7 10*3/uL — ABNORMAL LOW (ref 4.0–10.5)

## 2011-08-06 LAB — AMMONIA: Ammonia: 52 umol/L (ref 11–60)

## 2011-08-06 MED ORDER — ACETAMINOPHEN 650 MG RE SUPP: 650.0000 mg | Freq: Four times a day (QID) | RECTAL | Status: DC | PRN

## 2011-08-06 MED ORDER — LEVETIRACETAM 750 MG PO TABS: 1500.0000 mg | ORAL_TABLET | Freq: Two times a day (BID) | ORAL | Status: DC

## 2011-08-06 MED ORDER — SODIUM CHLORIDE 0.9 % IV SOLN
INTRAVENOUS | Status: DC
  Administered 2011-08-06: 04:00:00 via INTRAVENOUS

## 2011-08-06 MED ORDER — ACETAMINOPHEN 325 MG PO TABS: 650.0000 mg | ORAL_TABLET | Freq: Four times a day (QID) | ORAL | Status: DC | PRN

## 2011-08-06 MED ORDER — OXYCODONE HCL 5 MG PO TABS: 5.0000 mg | ORAL_TABLET | ORAL | Status: DC | PRN

## 2011-08-06 MED ORDER — ZINC SULFATE 220 (50 ZN) MG PO CAPS
220.0000 mg | ORAL_CAPSULE | Freq: Every day | ORAL | Status: DC
  Administered 2011-08-06: 220 mg via ORAL
  Filled 2011-08-06: qty 1

## 2011-08-06 MED ORDER — FUROSEMIDE 20 MG PO TABS: 60.0000 mg | ORAL_TABLET | Freq: Two times a day (BID) | ORAL | Status: DC

## 2011-08-06 MED ORDER — ONDANSETRON HCL 4 MG PO TABS: 4.0000 mg | ORAL_TABLET | Freq: Four times a day (QID) | ORAL | Status: DC | PRN

## 2011-08-06 MED ORDER — HYDROMORPHONE HCL PF 1 MG/ML IJ SOLN: 0.5000 mg | INTRAMUSCULAR | Status: DC | PRN

## 2011-08-06 MED ORDER — LACTULOSE 10 GM/15ML PO SOLN
10.0000 g | Freq: Three times a day (TID) | ORAL | Status: DC
  Administered 2011-08-06: 10 g via ORAL
  Filled 2011-08-06 (×5): qty 15

## 2011-08-06 MED ORDER — SPIRONOLACTONE 50 MG PO TABS: 150.0000 mg | ORAL_TABLET | Freq: Every day | ORAL | Status: DC

## 2011-08-06 MED ORDER — LACTULOSE 10 GM/15ML PO SOLN: 10.0000 g | Freq: Three times a day (TID) | ORAL | Status: DC

## 2011-08-06 MED ORDER — ONDANSETRON HCL 4 MG/2ML IJ SOLN: 4.0000 mg | Freq: Four times a day (QID) | INTRAMUSCULAR | Status: DC | PRN

## 2011-08-06 MED ORDER — LEVETIRACETAM 750 MG PO TABS
1500.0000 mg | ORAL_TABLET | Freq: Two times a day (BID) | ORAL | Status: DC
  Administered 2011-08-06: 1500 mg via ORAL
  Filled 2011-08-06 (×3): qty 2

## 2011-08-06 NOTE — ED Notes (Signed)
Pt still per norm from assuming care at 19:00. pt able to follow commands and move extremities with some slowness per norm from 19:00. Pt still alert to person, place and time. Pt able to sit up with command. Pt aware of transfer and admission.

## 2011-08-06 NOTE — Plan of Care (Signed)
Problem: Phase I Progression Outcomes Goal: OOB as tolerated unless otherwise ordered Outcome: Not Applicable Date Met:  08/06/11 Pt is on bed rest

## 2011-08-06 NOTE — ED Provider Notes (Signed)
Medical screening examination/treatment/procedure(s) were conducted as a shared visit with non-physician practitioner(s) and myself.  I personally evaluated the patient during the encounter  Hx Wilson's disease follows at [facility].  AMS x 1 day and not taking meds.  No fever or abdominal pain.  Oriented x2 and follows commands.  +asterixis  Glynn Octave, MD 08/06/11 1005

## 2011-08-06 NOTE — Discharge Summary (Signed)
Patient ID: [name] MRN: [id number] DOB/AGE: 01-12-1966 45 y.o.  Admit date: 08/05/2011 Discharge date: 08/06/2011  Primary Care Physician:  Oliver Barre, MD, MD  Discharge Diagnoses:    Present on Admission:  .Hepatic encephalopathy .Wilson disease .Thrombocytopenia .Chronic liver disease and cirrhosis .Encephalopathy  Principal Problem:  *Hepatic encephalopathy Active Problems:  Wilson disease  Encephalopathy  Thrombocytopenia  Chronic liver disease and cirrhosis   Current Discharge Medication List    START taking these medications   Details  lactulose (CHRONULAC) 10 GM/15ML solution Take 15 mLs (10 g total) by mouth 4 (four) times daily -  with meals and at bedtime. Qty: 240 mL, Refills: 3    oxyCODONE (OXY IR/ROXICODONE) 5 MG immediate release tablet Take 1 tablet (5 mg total) by mouth every 4 (four) hours as needed. Qty: 30 tablet, Refills: 0      CONTINUE these medications which have CHANGED   Details  !! furosemide (LASIX) 20 MG tablet Take 3 tablets (60 mg total) by mouth 2 (two) times daily. Qty: 62 tablet, Refills: 3    levETIRAcetam (KEPPRA) 750 MG tablet Take 2 tablets (1,500 mg total) by mouth 2 (two) times daily. Qty: 62 tablet, Refills: 3    !! spironolactone (ALDACTONE) 50 MG tablet Take 3 tablets (150 mg total) by mouth daily. Qty: 31 tablet, Refills: 3     !! - Potential duplicate medications found. Please discuss with provider.    CONTINUE these medications which have NOT CHANGED   Details  !! furosemide (LASIX) 20 MG tablet Take 60 mg by mouth 2 (two) times daily.      lactulose (CEPHULAC) 10 G packet Take 10 g by mouth 3 (three) times daily.      milk thistle 175 MG tablet Take 175 mg by mouth 3 (three) times daily.     omeprazole (PRILOSEC) 40 MG capsule Take 40 mg by mouth 2 (two) times daily.      potassium gluconate 595 MG TABS Take 595 mg by mouth daily.      propranolol (INDERAL) 10 MG tablet Take 10 mg by mouth 2 (two) times  daily.     QUEtiapine (SEROQUEL) 100 MG tablet Take 100-150 mg by mouth 2 (two) times daily. Take 1 by mouth every morning and 1 and 1/2 by mouth every evening    rifaximin (XIFAXAN) 550 MG TABS Take 550 mg by mouth 2 (two) times daily.     !! spironolactone (ALDACTONE) 100 MG tablet Take 150 mg by mouth daily.     zinc gluconate 50 MG tablet Take 50 mg by mouth 3 (three) times daily.      !! - Potential duplicate medications found. Please discuss with provider.      Disposition and Follow-up: Pt will need to follow up with PCP in 2-3 weeks. Please obtain CBC to follow up on platelets number. Pt's baseline appears to be anywhere from 60-90. Also, during the hospitalization abdominal US was ordered and completed and pt was given physician contact information to obtain results which we will also send to PCP. Please note that pt's dose of Keppra was increased to 1500 mg BID and I am not sure if this was done by PCP or in ED and pt was not sure either. Please review the medications and ensure that pt is on correct dose.    Consults: none  Significant Diagnostic Studies:   Abdominal US:  IMPRESSION:  Changes of cirrhosis with associated splenomegaly.  Cholelithiasis.  Gallbladder wall thickening, likely  related to liver disease.  CXR: no acute findings except low lung volumes/ ? Atelectasis  CT head: no acute intracranial findings  Brief H and P:  [name] is an 45 y.o. male with a history of Wilson's Disease and Cirrhosis who was brought to the Emergency Department Due to progressive decline noticed by his caretaker. Patient had increased lethargy over the day and was unable to take his lactulose therapy. He was brought to the emergency department for further evaluation and found to have an Elevated Ammonia level of 113.   Physical Exam on Discharge:  Filed Vitals:   08/06/11 0045 08/06/11 0130 08/06/11 0208 08/06/11 0700  BP: 116/65 126/78  104/61  Pulse: 65 61  61  Temp:   98 F (36.7 C)  98.2 F (36.8 C)  TempSrc:  Oral  Oral  Resp:  20  20  Height:   6' (1.829 m)   Weight:   83.915 kg (185 lb)   SpO2: 97% 98%  95%    No intake or output data in the 24 hours ending 08/06/11 1053  General: Alert, awake, oriented x3, in no acute distress. HEENT: No bruits, no goiter. Heart: Regular rate and rhythm, without murmurs, rubs, gallops. Lungs: Clear to auscultation bilaterally. Abdomen: Soft, nontender, nondistended, positive bowel sounds. Extremities: No clubbing cyanosis or edema with positive pedal pulses. Neuro: Grossly intact, nonfocal.  CBC:    Component Value Date/Time   WBC 3.7* 08/06/2011 0530   HGB 12.6* 08/06/2011 0530   HCT 37.0* 08/06/2011 0530   PLT 33* 08/06/2011 0530   MCV 97.1 08/06/2011 0530   NEUTROABS 2.5 08/05/2011 2003   LYMPHSABS 1.0 08/05/2011 2003   MONOABS 0.4 08/05/2011 2003   EOSABS 0.1 08/05/2011 2003   BASOSABS 0.0 08/05/2011 2003    Basic Metabolic Panel:    Component Value Date/Time   NA 137 08/06/2011 0530   K 4.0 08/06/2011 0530   CL 107 08/06/2011 0530   CO2 23 08/06/2011 0530   BUN 12 08/06/2011 0530   CREATININE 0.71 08/06/2011 0530   GLUCOSE 82 08/06/2011 0530   CALCIUM 9.3 08/06/2011 0530    Hospital Course:  Principal Problem:  *Hepatic encephalopathy - clinically improving - ammonia levels trending down - no focal neurologic deficits noted on today's examination  Active Problems:  Wilson disease - continue treatment as noted above   Encephalopathy - resolved   Thrombocytopenia - no active signs of bleeding, will need follow up CBC in an outpatient setting   Chronic liver disease and cirrhosis - continue treatment with Lactulose, Lasix, Spironolactone  Time spent on Discharge: Over 30 minutes  Signed: MAGICK-Candice Lunney 08/06/2011, 10:53 AM

## 2011-08-07 NOTE — Progress Notes (Signed)
Retro ur ins review, debbie Evee Liska rn,bsn

## 2011-08-07 NOTE — Progress Notes (Signed)
Pt discharged home in stable condition with all belongings and family. Medications and discharge instructions reviewed prior to discharge.  

## 2011-08-16 ENCOUNTER — Telehealth: Payer: Self-pay | Admitting: *Deleted

## 2011-08-16 ENCOUNTER — Encounter: Payer: Self-pay | Admitting: Internal Medicine

## 2011-08-16 ENCOUNTER — Ambulatory Visit (INDEPENDENT_AMBULATORY_CARE_PROVIDER_SITE_OTHER): Payer: Medicare HMO | Admitting: Internal Medicine

## 2011-08-16 VITALS — BP 102/60 | HR 59 | Temp 98.8°F

## 2011-08-16 DIAGNOSIS — J209 Acute bronchitis, unspecified: Secondary | ICD-10-CM

## 2011-08-16 DIAGNOSIS — F17200 Nicotine dependence, unspecified, uncomplicated: Secondary | ICD-10-CM

## 2011-08-16 DIAGNOSIS — J069 Acute upper respiratory infection, unspecified: Secondary | ICD-10-CM

## 2011-08-16 MED ORDER — DOXYCYCLINE HYCLATE 100 MG PO TABS: 100.0000 mg | ORAL_TABLET | Freq: Two times a day (BID) | ORAL | Status: AC

## 2011-08-16 MED ORDER — BENZONATATE 100 MG PO CAPS: 100.0000 mg | ORAL_CAPSULE | Freq: Three times a day (TID) | ORAL | Status: AC | PRN

## 2011-08-16 MED ORDER — HYDROCODONE-HOMATROPINE 5-1.5 MG/5ML PO SYRP: 5.0000 mL | ORAL_SOLUTION | Freq: Four times a day (QID) | ORAL | Status: AC | PRN

## 2011-08-16 NOTE — Telephone Encounter (Signed)
No narcotic will be covered by insurance - can try tessalon perles - erx done

## 2011-08-16 NOTE — Telephone Encounter (Signed)
Notified pt with md response...08/16/11@2 :47pm/LMB

## 2011-08-16 NOTE — Patient Instructions (Signed)
It was good to see you today. Doxy antibiotics and prescription cough syrup - Your prescription(s) have been submitted to your pharmacy. Please take as directed and contact our office if you believe you are having problem(s) with the medication(s). Continue to work on giving up the cigarettes as you have begun doing - if you need any help in this endeavor, please call us for assistance as needed Use warm salt water gargle for sore throat symptoms as needed  Call if symptoms worse or unimproved in next 10-14 days

## 2011-08-16 NOTE — Progress Notes (Signed)
  Subjective:    HPI  complains of cold symptoms  Onset >1 week ago, wax/wane symptoms  - started in head, now predominate chest symptoms  Initially associated with rhinorrhea, sneezing, sore throat, mild headache and low grade fever Now associated with with productive cough, myalgias, sinus pressure and mild-mod chest congestion No relief with OTC meds Precipitated by sick contacts  Past Medical History  Diagnosis Date  . Hernia   . Bipolar affective disorder     [facility]  . Depression   . Seizures     Dr Anne Hahn  . Abuse, drug or alcohol   . Wilson disease   . Encephalopathy   . Thrombocytopenia   . Chronic liver disease and cirrhosis   . History of alcohol abuse     Review of Systems Constitutional: No night sweats, no unexpected weight change Pulmonary: No pleurisy or hemoptysis Cardiovascular: No chest pain or palpitations     Objective:   Physical Exam BP 102/60  Pulse 59  Temp(Src) 98.8 F (37.1 C) (Oral)  SpO2 98% GEN: mildly ill appearing and audible chest congestion HENT: NCAT, no sinus tenderness bilaterally, nares with clear discharge, oropharynx mild erythema, no exudate Eyes: Vision grossly intact, no conjunctivitis Lungs: B scattered rhonchi, but no wheeze, no increased work of breathing Cardiovascular: Regular rate and rhythm, no bilateral edema  CHEST - 2 VIEW 08/05/2011: Comparison: 12/24/2007  Findings:The heart, mediastinal, and hilar contours are within  normal limits and stable. Low lung volumes. Lungs appear clear.  No acute bony abnormality.  IMPRESSION:  Low lung volumes. No acute findings.      Assessment & Plan:  Viral URI - improved Cough, early bronchitis smoker   Empiric antibiotics prescribed due to symptom duration greater than 7 days Prescription cough suppression - new prescriptions done Symptomatic care with Tylenol or Advil, hydration and rest -  salt gargle advised as needed Advised cessation - pt  recently purchased Blu e-cig and working on same for new year 2013 - 5 minutes today spent counseling patient on unhealthy effects of continued tobacco abuse and encouragement of cessation including medical options available to help the patient quit smoking.

## 2011-08-16 NOTE — Telephone Encounter (Signed)
Pt call back and stated cough syrup md rx his insurance will not cover. Requesting something else...08/16/11@11 :45am/LMB

## 2011-10-06 ENCOUNTER — Other Ambulatory Visit (INDEPENDENT_AMBULATORY_CARE_PROVIDER_SITE_OTHER): Payer: Medicare Other

## 2011-10-06 DIAGNOSIS — Z0000 Encounter for general adult medical examination without abnormal findings: Secondary | ICD-10-CM

## 2011-10-06 DIAGNOSIS — J45909 Unspecified asthma, uncomplicated: Secondary | ICD-10-CM

## 2011-10-06 DIAGNOSIS — K219 Gastro-esophageal reflux disease without esophagitis: Secondary | ICD-10-CM

## 2011-10-06 DIAGNOSIS — Z79899 Other long term (current) drug therapy: Secondary | ICD-10-CM

## 2011-10-06 DIAGNOSIS — Z125 Encounter for screening for malignant neoplasm of prostate: Secondary | ICD-10-CM

## 2011-10-06 LAB — BASIC METABOLIC PANEL
BUN: 11 mg/dL (ref 6–23)
CO2: 26 mEq/L (ref 19–32)
Calcium: 8.6 mg/dL (ref 8.4–10.5)
Chloride: 101 mEq/L (ref 96–112)
Creatinine, Ser: 1 mg/dL (ref 0.4–1.5)
GFR: 90.14 mL/min (ref >60.00)
Glucose, Bld: 117 mg/dL — ABNORMAL HIGH (ref 70–99)
Potassium: 3.3 mEq/L — ABNORMAL LOW (ref 3.5–5.1)
Sodium: 134 mEq/L — ABNORMAL LOW (ref 135–145)

## 2011-10-06 LAB — URINALYSIS, ROUTINE W REFLEX MICROSCOPIC
Bilirubin Urine: NEGATIVE
Hgb urine dipstick: NEGATIVE
Ketones, ur: NEGATIVE
Leukocytes, UA: NEGATIVE
Nitrite: NEGATIVE
Specific Gravity, Urine: 1.015 (ref 1.000–1.030)
Total Protein, Urine: NEGATIVE
Urine Glucose: NEGATIVE
Urobilinogen, UA: 0.2 (ref 0.0–1.0)
pH: 6 (ref 5.0–8.0)

## 2011-10-06 LAB — CBC WITH DIFFERENTIAL/PLATELET
Basophils Absolute: 0.1 10*3/uL (ref 0.0–0.1)
Basophils Relative: 1.3 % (ref 0.0–3.0)
Eosinophils Absolute: 0.1 10*3/uL (ref 0.0–0.7)
Eosinophils Relative: 1.2 % (ref 0.0–5.0)
HCT: 40.3 % (ref 39.0–52.0)
Hemoglobin: 13.8 g/dL (ref 13.0–17.0)
Lymphocytes Relative: 18.5 % (ref 12.0–46.0)
Lymphs Abs: 1.3 10*3/uL (ref 0.7–4.0)
MCHC: 34.1 g/dL (ref 30.0–36.0)
MCV: 98.5 fl (ref 78.0–100.0)
Monocytes Absolute: 0.5 10*3/uL (ref 0.1–1.0)
Monocytes Relative: 7.5 % (ref 3.0–12.0)
Neutro Abs: 4.8 10*3/uL (ref 1.4–7.7)
Neutrophils Relative %: 71.5 % (ref 43.0–77.0)
Platelets: 53 10*3/uL — ABNORMAL LOW (ref 150.0–400.0)
RBC: 4.1 Mil/uL — ABNORMAL LOW (ref 4.22–5.81)
RDW: 15.5 % — ABNORMAL HIGH (ref 11.5–14.6)
WBC: 6.8 10*3/uL (ref 4.5–10.5)

## 2011-10-06 LAB — HEPATIC FUNCTION PANEL
ALT: 36 U/L (ref 0–53)
AST: 42 U/L — ABNORMAL HIGH (ref 0–37)
Albumin: 3.1 g/dL — ABNORMAL LOW (ref 3.5–5.2)
Alkaline Phosphatase: 140 U/L — ABNORMAL HIGH (ref 39–117)
Bilirubin, Direct: 0.5 mg/dL — ABNORMAL HIGH (ref 0.0–0.3)
Total Bilirubin: 2.3 mg/dL — ABNORMAL HIGH (ref 0.3–1.2)
Total Protein: 6.1 g/dL (ref 6.0–8.3)

## 2011-10-06 LAB — LIPID PANEL
Cholesterol: 169 mg/dL (ref 0–200)
HDL: 51.2 mg/dL (ref >39.00)
LDL Cholesterol: 93 mg/dL (ref 0–99)
Total CHOL/HDL Ratio: 3
Triglycerides: 124 mg/dL (ref 0.0–149.0)
VLDL: 24.8 mg/dL (ref 0.0–40.0)

## 2011-10-06 LAB — PSA: PSA: 0.11 ng/mL (ref 0.10–4.00)

## 2011-10-06 LAB — TSH: TSH: 2.12 u[IU]/mL (ref 0.35–5.50)

## 2011-10-19 ENCOUNTER — Encounter: Payer: Self-pay | Admitting: Internal Medicine

## 2011-10-19 ENCOUNTER — Ambulatory Visit (INDEPENDENT_AMBULATORY_CARE_PROVIDER_SITE_OTHER): Payer: Medicare Other | Admitting: Internal Medicine

## 2011-10-19 VITALS — BP 110/70 | HR 56 | Temp 97.0°F | Ht 72.0 in | Wt 199.5 lb

## 2011-10-19 DIAGNOSIS — E876 Hypokalemia: Secondary | ICD-10-CM

## 2011-10-19 DIAGNOSIS — R7302 Impaired glucose tolerance (oral): Secondary | ICD-10-CM

## 2011-10-19 DIAGNOSIS — R7309 Other abnormal glucose: Secondary | ICD-10-CM

## 2011-10-19 DIAGNOSIS — L259 Unspecified contact dermatitis, unspecified cause: Secondary | ICD-10-CM

## 2011-10-19 DIAGNOSIS — L309 Dermatitis, unspecified: Secondary | ICD-10-CM

## 2011-10-19 DIAGNOSIS — D696 Thrombocytopenia, unspecified: Secondary | ICD-10-CM

## 2011-10-19 DIAGNOSIS — Z0000 Encounter for general adult medical examination without abnormal findings: Secondary | ICD-10-CM

## 2011-10-19 MED ORDER — TRIAMCINOLONE ACETONIDE 0.1 % EX CREA: TOPICAL_CREAM | Freq: Two times a day (BID) | CUTANEOUS | Status: AC

## 2011-10-19 MED ORDER — POTASSIUM CHLORIDE ER 10 MEQ PO TBCR: 10.0000 meq | EXTENDED_RELEASE_TABLET | Freq: Two times a day (BID) | ORAL | Status: DC

## 2011-10-19 NOTE — Patient Instructions (Signed)
Take all new medications as prescribed - the new potassium, and the cream for the rash Continue all other medications as before Please keep your appointments with your specialists as you have planned - [facility] Please return in 1 year for your yearly visit, or sooner if needed, with Lab testing done 3-5 days before

## 2011-10-19 NOTE — Progress Notes (Signed)
Subjective:    Patient ID: [name], male    DOB: 15-Jul-1966, 46 y.o.   MRN: [id number]  HPI  Here to f/u; overall doing ok,  Pt denies chest pain, increased sob or doe, wheezing, orthopnea, PND, increased LE swelling, palpitations, dizziness or syncope.  Pt denies new neurological symptoms such as new headache, or facial or extremity weakness or numbness   Pt denies polydipsia, polyuria, or low sugar symptoms such as weakness or confusion improved with po intake.  Pt states overall good compliance with meds, trying to follow lower cholesterol diet, wt overall stable but little exercise however.  Was able to get labs done from last visit  In august about a wk ago. No overt bleeding or bruising.  Does have itchy rash to distal leg, no pain, fever, drainage Past Medical History  Diagnosis Date  . Hernia   . Bipolar affective disorder     [facility]  . Depression   . Seizures     Dr Anne Hahn  . Abuse, drug or alcohol   . Wilson disease   . Encephalopathy   . Thrombocytopenia   . Chronic liver disease and cirrhosis   . History of alcohol abuse    Past Surgical History  Procedure Date  . Left inguinal hernia june 2012    [facility]  . Vascular surgery     reports that he has been smoking.  He does not have any smokeless tobacco history on file. He reports that he drinks alcohol. He reports that he does not use illicit drugs. family history includes Heart disease in his other. Allergies  Allergen Reactions  . Codeine    Current Outpatient Prescriptions on File Prior to Visit  Medication Sig Dispense Refill  . furosemide (LASIX) 20 MG tablet Take 3 tablets (60 mg total) by mouth 2 (two) times daily.  62 tablet  3  . milk thistle 175 MG tablet Take 175 mg by mouth 3 (three) times daily.       [name] omeprazole (PRILOSEC) 40 MG capsule Take 40 mg by mouth 2 (two) times daily.        . propranolol (INDERAL) 10 MG tablet Take 10 mg by mouth 2 (two) times daily.       .  rifaximin (XIFAXAN) 550 MG TABS Take 550 mg by mouth 2 (two) times daily.       [name] spironolactone (ALDACTONE) 50 MG tablet Take 3 tablets (150 mg total) by mouth daily.  31 tablet  3  . zinc gluconate 50 MG tablet Take 50 mg by mouth 3 (three) times daily.       . benzonatate (TESSALON PERLES) 100 MG capsule Take 1 capsule (100 mg total) by mouth 3 (three) times daily as needed for cough.  30 capsule  0  . lactulose (CHRONULAC) 10 GM/15ML solution Take 15 mLs (10 g total) by mouth 4 (four) times daily -  with meals and at bedtime.  240 mL  3  . levETIRAcetam (KEPPRA) 750 MG tablet Take 2 tablets (1,500 mg total) by mouth 2 (two) times daily.  62 tablet  3  . oxyCODONE (OXY IR/ROXICODONE) 5 MG immediate release tablet Take 1 tablet (5 mg total) by mouth every 4 (four) hours as needed.  30 tablet  0  . potassium gluconate 595 MG TABS Take 595 mg by mouth daily.        . QUEtiapine (SEROQUEL) 100 MG tablet Take 100-150 mg by mouth 2 (two)  times daily. Take 1 by mouth every morning and 1 and 1/2 by mouth every evening       Review of Systems Review of Systems  Constitutional: Negative for diaphoresis and unexpected weight change.  HENT: Negative for drooling and tinnitus.   Eyes: Negative for photophobia and visual disturbance.  Respiratory: Negative for choking and stridor.   Gastrointestinal: Negative for vomiting and blood in stool.  Genitourinary: Negative for hematuria and decreased urine volume.     Objective:   Physical Exam BP 110/70  Pulse 56  Temp(Src) 97 F (36.1 C) (Oral)  Ht 6' (1.829 m)  Wt 199 lb 8 oz (90.493 kg)  BMI 27.06 kg/m2  SpO2 98% Physical Exam  VS noted, not ill appearing Constitutional: Pt appears well-developed and well-nourished.  HENT: Head: Normocephalic.  Right Ear: External ear normal.  Left Ear: External ear normal.  Eyes: Conjunctivae and EOM are normal. Pupils are equal, round, and reactive to light.  Neck: Normal range of motion. Neck supple.    Cardiovascular: Normal rate and regular rhythm.   Pulmonary/Chest: Effort normal and breath sounds normal.  Neurological: Pt is alert. No cranial nerve deficit.  Skin: Skin is warm. No erythema. scaly rash approx 4 cm area right distal lateral leg above the ankle Psychiatric: Pt behavior is normal. Thought content normal.     Assessment & Plan:

## 2011-10-21 ENCOUNTER — Encounter: Payer: Self-pay | Admitting: Internal Medicine

## 2011-10-21 DIAGNOSIS — L309 Dermatitis, unspecified: Secondary | ICD-10-CM | POA: Insufficient documentation

## 2011-10-21 DIAGNOSIS — E876 Hypokalemia: Secondary | ICD-10-CM | POA: Insufficient documentation

## 2011-10-21 NOTE — Assessment & Plan Note (Signed)
stable overall by hx and exam, most recent data reviewed with pt, and pt to continue medical treatment as before,e  Lab Results  Component Value Date   WBC 6.8 10/06/2011   HGB 13.8 10/06/2011   HCT 40.3 10/06/2011   MCV 98.5 10/06/2011   PLT 53.0* 10/06/2011

## 2011-10-21 NOTE — Assessment & Plan Note (Signed)
Mild, to add klor con asd ,  to f/u any worsening symptoms or concerns Lab Results  Component Value Date   WBC 6.8 10/06/2011   HGB 13.8 10/06/2011   HCT 40.3 10/06/2011   PLT 53.0* 10/06/2011   GLUCOSE 117* 10/06/2011   CHOL 169 10/06/2011   TRIG 124.0 10/06/2011   HDL 51.20 10/06/2011   LDLCALC 93 10/06/2011   ALT 36 10/06/2011   AST 42* 10/06/2011   NA 134* 10/06/2011   K 3.3* 10/06/2011   CL 101 10/06/2011   CREATININE 1.0 10/06/2011   BUN 11 10/06/2011   CO2 26 10/06/2011   TSH 2.12 10/06/2011   PSA 0.11 10/06/2011   INR 1.67* 11/28/2010

## 2011-10-21 NOTE — Assessment & Plan Note (Signed)
stable overall by hx and exam, most recent data reviewed with pt, and pt to continue medical treatment as before Lab Results  Component Value Date   WBC 6.8 10/06/2011   HGB 13.8 10/06/2011   HCT 40.3 10/06/2011   PLT 53.0* 10/06/2011   GLUCOSE 117* 10/06/2011   CHOL 169 10/06/2011   TRIG 124.0 10/06/2011   HDL 51.20 10/06/2011   LDLCALC 93 10/06/2011   ALT 36 10/06/2011   AST 42* 10/06/2011   NA 134* 10/06/2011   K 3.3* 10/06/2011   CL 101 10/06/2011   CREATININE 1.0 10/06/2011   BUN 11 10/06/2011   CO2 26 10/06/2011   TSH 2.12 10/06/2011   PSA 0.11 10/06/2011   INR 1.67* 11/28/2010

## 2011-10-21 NOTE — Assessment & Plan Note (Signed)
Mild to mod, for triam cr course,  to f/u any worsening symptoms or concerns

## 2012-05-01 ENCOUNTER — Other Ambulatory Visit: Payer: Self-pay

## 2012-05-01 MED ORDER — POTASSIUM CHLORIDE ER 10 MEQ PO TBCR: 10.0000 meq | EXTENDED_RELEASE_TABLET | Freq: Two times a day (BID) | ORAL | Status: DC

## 2012-07-29 ENCOUNTER — Other Ambulatory Visit: Payer: Self-pay | Admitting: Internal Medicine

## 2013-01-07 ENCOUNTER — Ambulatory Visit (INDEPENDENT_AMBULATORY_CARE_PROVIDER_SITE_OTHER): Payer: Medicare Other

## 2013-01-07 ENCOUNTER — Encounter: Payer: Self-pay | Admitting: Internal Medicine

## 2013-01-07 ENCOUNTER — Ambulatory Visit (INDEPENDENT_AMBULATORY_CARE_PROVIDER_SITE_OTHER): Payer: Medicare Other | Admitting: Internal Medicine

## 2013-01-07 VITALS — BP 108/70 | HR 55 | Temp 97.1°F | Ht 72.0 in | Wt 190.2 lb

## 2013-01-07 DIAGNOSIS — Z0000 Encounter for general adult medical examination without abnormal findings: Secondary | ICD-10-CM

## 2013-01-07 DIAGNOSIS — R7302 Impaired glucose tolerance (oral): Secondary | ICD-10-CM

## 2013-01-07 DIAGNOSIS — N529 Male erectile dysfunction, unspecified: Secondary | ICD-10-CM | POA: Insufficient documentation

## 2013-01-07 DIAGNOSIS — R7309 Other abnormal glucose: Secondary | ICD-10-CM

## 2013-01-07 DIAGNOSIS — F319 Bipolar disorder, unspecified: Secondary | ICD-10-CM

## 2013-01-07 LAB — PSA: PSA: 0.5 ng/mL (ref 0.10–4.00)

## 2013-01-07 LAB — URINALYSIS, ROUTINE W REFLEX MICROSCOPIC
Bilirubin Urine: NEGATIVE
Hgb urine dipstick: NEGATIVE
Ketones, ur: NEGATIVE
Leukocytes, UA: NEGATIVE
Nitrite: NEGATIVE
Specific Gravity, Urine: 1.01 (ref 1.000–1.030)
Total Protein, Urine: NEGATIVE
Urine Glucose: NEGATIVE
Urobilinogen, UA: 0.2 (ref 0.0–1.0)
pH: 7 (ref 5.0–8.0)

## 2013-01-07 LAB — TSH: TSH: 1.36 u[IU]/mL (ref 0.35–5.50)

## 2013-01-07 LAB — CBC WITH DIFFERENTIAL/PLATELET
Basophils Absolute: 0.1 10*3/uL (ref 0.0–0.1)
Basophils Relative: 1.1 % (ref 0.0–3.0)
Eosinophils Absolute: 0.2 10*3/uL (ref 0.0–0.7)
Eosinophils Relative: 2.9 % (ref 0.0–5.0)
HCT: 41.6 % (ref 39.0–52.0)
Hemoglobin: 14.5 g/dL (ref 13.0–17.0)
Lymphocytes Relative: 16.7 % (ref 12.0–46.0)
Lymphs Abs: 1 10*3/uL (ref 0.7–4.0)
MCHC: 34.8 g/dL (ref 30.0–36.0)
MCV: 95.7 fl (ref 78.0–100.0)
Monocytes Absolute: 0.6 10*3/uL (ref 0.1–1.0)
Monocytes Relative: 11.1 % (ref 3.0–12.0)
Neutro Abs: 3.9 10*3/uL (ref 1.4–7.7)
Neutrophils Relative %: 68.2 % (ref 43.0–77.0)
Platelets: 46 10*3/uL — CL (ref 150.0–400.0)
RBC: 4.35 Mil/uL (ref 4.22–5.81)
RDW: 16.8 % — ABNORMAL HIGH (ref 11.5–14.6)
WBC: 5.7 10*3/uL (ref 4.5–10.5)

## 2013-01-07 LAB — BASIC METABOLIC PANEL
BUN: 10 mg/dL (ref 6–23)
CO2: 28 mEq/L (ref 19–32)
Calcium: 9.1 mg/dL (ref 8.4–10.5)
Chloride: 103 mEq/L (ref 96–112)
Creatinine, Ser: 0.8 mg/dL (ref 0.4–1.5)
GFR: 112.57 mL/min (ref >60.00)
Glucose, Bld: 98 mg/dL (ref 70–99)
Potassium: 4.3 mEq/L (ref 3.5–5.1)
Sodium: 135 mEq/L (ref 135–145)

## 2013-01-07 LAB — HEPATIC FUNCTION PANEL
ALT: 50 U/L (ref 0–53)
AST: 53 U/L — ABNORMAL HIGH (ref 0–37)
Albumin: 3.1 g/dL — ABNORMAL LOW (ref 3.5–5.2)
Alkaline Phosphatase: 139 U/L — ABNORMAL HIGH (ref 39–117)
Bilirubin, Direct: 0.6 mg/dL — ABNORMAL HIGH (ref 0.0–0.3)
Total Bilirubin: 3.2 mg/dL — ABNORMAL HIGH (ref 0.3–1.2)
Total Protein: 5.8 g/dL — ABNORMAL LOW (ref 6.0–8.3)

## 2013-01-07 LAB — LIPID PANEL
Cholesterol: 213 mg/dL — ABNORMAL HIGH (ref 0–200)
HDL: 66.2 mg/dL (ref >39.00)
Total CHOL/HDL Ratio: 3
Triglycerides: 89 mg/dL (ref 0.0–149.0)
VLDL: 17.8 mg/dL (ref 0.0–40.0)

## 2013-01-07 LAB — TESTOSTERONE: Testosterone: 874.74 ng/dL (ref 350.00–890.00)

## 2013-01-07 LAB — HEMOGLOBIN A1C: Hgb A1c MFr Bld: 4.4 % — ABNORMAL LOW (ref 4.6–6.5)

## 2013-01-07 MED ORDER — SILDENAFIL CITRATE 100 MG PO TABS: 50.0000 mg | ORAL_TABLET | Freq: Every day | ORAL | Status: DC | PRN

## 2013-01-07 MED ORDER — TADALAFIL 20 MG PO TABS: 20.0000 mg | ORAL_TABLET | Freq: Every day | ORAL | Status: DC | PRN

## 2013-01-07 NOTE — Assessment & Plan Note (Signed)

## 2013-01-07 NOTE — Patient Instructions (Addendum)
Please take all new medication as prescribed - you have the 3 free presction and the coupon for the viagra;  You are also given the 3 free cialis 20 mg prescription as well to try (you would need to print off the free coupon for that one at home) Please continue all other medications as before Please have the pharmacy call with any other refills you may need. Please go to the LAB in the Basement (turn left off the elevator) for the tests to be done today You will be contacted by phone if any changes need to be made immediately.  Otherwise, you will receive a letter about your results with an explanation, but please check with MyChart first. Thank you for enrolling in MyChart. Please follow the instructions below to securely access your online medical record. MyChart allows you to send messages to your doctor, view your test results, renew your prescriptions, schedule appointments, and more. To Log into My Chart online, please go by [employer] or [employer] to [email or web address], or download the MyChart App from the [employer] .  Your Username is: [email or web address] ([email or web address]) Please return in 1 year for your yearly visit, or sooner if needed, with Lab testing done 3-5 days before

## 2013-01-07 NOTE — Progress Notes (Signed)
Subjective:    Patient ID: [name], male    DOB: 21-Nov-1965, 47 y.o.   MRN: [id number]  HPI  Here for wellness and f/u;  Overall doing ok;  Pt denies CP, worsening SOB, DOE, wheezing, orthopnea, PND, worsening LE edema, palpitations, dizziness or syncope.  Pt denies neurological change such as new headache, facial or extremity weakness.  Pt denies polydipsia, polyuria, or low sugar symptoms. Pt states overall good compliance with treatment and medications, good tolerability, and has been trying to follow lower cholesterol diet.  Pt denies worsening depressive symptoms, suicidal ideation or panic. No fever, night sweats, wt loss, loss of appetite, or other constitutional symptoms.  Pt states good ability with ADL's, has low fall risk, home safety reviewed and adequate, no other significant changes in hearing or vision, and only occasionally active with exercise. Recently involved in a new more regular sexual relationship, but with ED problems with not being able to keep an erection.  Past Medical History  Diagnosis Date  . Hernia   . Bipolar affective disorder     [facility]  . Depression   . Seizures     Dr Anne Hahn  . Abuse, drug or alcohol   . Wilson disease   . Encephalopathy   . Thrombocytopenia   . Chronic liver disease and cirrhosis   . History of alcohol abuse    Past Surgical History  Procedure Laterality Date  . Left inguinal hernia  june 2012    [facility]  . Vascular surgery      reports that he has been smoking.  He does not have any smokeless tobacco history on file. He reports that  drinks alcohol. He reports that he does not use illicit drugs. family history includes Heart disease in his other. Allergies  Allergen Reactions  . Codeine    Current Outpatient Prescriptions on File Prior to Visit  Medication Sig Dispense Refill  . KLOR-CON 10 10 MEQ tablet TAKE 1 TABLET BY MOUTH TWICE DAILY  90 tablet  0  . levETIRAcetam (KEPPRA) 1000 MG tablet  Take 1,000 mg by mouth 2 (two) times daily.      . milk thistle 175 MG tablet Take 175 mg by mouth 3 (three) times daily.       [name] omeprazole (PRILOSEC) 40 MG capsule Take 40 mg by mouth 2 (two) times daily.        . potassium gluconate 595 MG TABS Take 595 mg by mouth daily.        . propranolol (INDERAL) 10 MG tablet Take 10 mg by mouth 2 (two) times daily.       . QUEtiapine (SEROQUEL) 100 MG tablet Take 100-150 mg by mouth 2 (two) times daily. Take 1 by mouth every morning and 1 and 1/2 by mouth every evening      . QUEtiapine (SEROQUEL) 100 MG tablet Take 1 and 1/2 by mouth every evening      . rifaximin (XIFAXAN) 550 MG TABS Take 550 mg by mouth 2 (two) times daily.       [name] zinc gluconate 50 MG tablet Take 50 mg by mouth 3 (three) times daily.       . furosemide (LASIX) 20 MG tablet Take 3 tablets (60 mg total) by mouth 2 (two) times daily.  62 tablet  3  . lactulose (CHRONULAC) 10 GM/15ML solution Take 15 mLs (10 g total) by mouth 4 (four) times daily -  with meals and at bedtime.  240 mL  3  . levETIRAcetam (KEPPRA) 750 MG tablet Take 2 tablets (1,500 mg total) by mouth 2 (two) times daily.  62 tablet  3  . oxyCODONE (OXY IR/ROXICODONE) 5 MG immediate release tablet Take 1 tablet (5 mg total) by mouth every 4 (four) hours as needed.  30 tablet  0  . spironolactone (ALDACTONE) 50 MG tablet Take 3 tablets (150 mg total) by mouth daily.  31 tablet  3   No current facility-administered medications on file prior to visit.   Review of Systems Constitutional: Negative for diaphoresis, activity change, appetite change or unexpected weight change.  HENT: Negative for hearing loss, ear pain, facial swelling, mouth sores and neck stiffness.   Eyes: Negative for pain, redness and visual disturbance.  Respiratory: Negative for shortness of breath and wheezing.   Cardiovascular: Negative for chest pain and palpitations.  Gastrointestinal: Negative for diarrhea, blood in stool, abdominal distention or  other pain Genitourinary: Negative for hematuria, flank pain or change in urine volume.  Musculoskeletal: Negative for myalgias and joint swelling.  Skin: Negative for color change and wound.  Neurological: Negative for syncope and numbness. other than noted Hematological: Negative for adenopathy.  Psychiatric/Behavioral: Negative for hallucinations, self-injury, decreased concentration and agitation.      Objective:   Physical Exam BP 108/70  Pulse 55  Temp(Src) 97.1 F (36.2 C) (Oral)  Ht 6' (1.829 m)  Wt 190 lb 4 oz (86.297 kg)  BMI 25.8 kg/m2  SpO2 97% VS noted,  Constitutional: Pt is oriented to person, place, and time. Appears well-developed and well-nourished.  Head: Normocephalic and atraumatic.  Right Ear: External ear normal.  Left Ear: External ear normal.  Nose: Nose normal.  Mouth/Throat: Oropharynx is clear and moist.  Eyes: Conjunctivae and EOM are normal. Pupils are equal, round, and reactive to light.  Neck: Normal range of motion. Neck supple. No JVD present. No tracheal deviation present.  Cardiovascular: Normal rate, regular rhythm, normal heart sounds and intact distal pulses.   Pulmonary/Chest: Effort normal and breath sounds normal.  Abdominal: Soft. Bowel sounds are normal. There is no tenderness. No HSM  Musculoskeletal: Normal range of motion. Exhibits no edema.  Lymphadenopathy:  Has no cervical adenopathy.  Neurological: Pt is alert and oriented to person, place, and time. Pt has normal reflexes. No cranial nerve deficit.  Skin: Skin is warm and dry. No rash noted.  Psychiatric:  Has  normal mood and affect. Behavior is normal.     Assessment & Plan:

## 2013-01-08 LAB — LDL CHOLESTEROL, DIRECT: Direct LDL: 117.8 mg/dL

## 2013-01-12 NOTE — Assessment & Plan Note (Signed)
tx prn,  to f/u any worsening symptoms or concerns

## 2013-01-12 NOTE — Assessment & Plan Note (Signed)
Asympt, for wt control,  to f/u any worsening symptoms or concerns

## 2013-04-07 ENCOUNTER — Telehealth: Payer: Self-pay | Admitting: Neurology

## 2013-04-07 MED ORDER — LEVETIRACETAM 1000 MG PO TABS: 1000.0000 mg | ORAL_TABLET | Freq: Two times a day (BID) | ORAL | Status: DC

## 2013-04-07 NOTE — Telephone Encounter (Signed)
Rx sent 

## 2013-05-06 ENCOUNTER — Other Ambulatory Visit: Payer: Self-pay | Admitting: Neurology

## 2013-06-03 ENCOUNTER — Encounter: Payer: Self-pay | Admitting: Internal Medicine

## 2013-06-03 ENCOUNTER — Ambulatory Visit (INDEPENDENT_AMBULATORY_CARE_PROVIDER_SITE_OTHER): Payer: Medicare Other | Admitting: Internal Medicine

## 2013-06-03 VITALS — BP 90/58 | HR 50 | Temp 97.1°F | Ht 72.0 in | Wt 193.4 lb

## 2013-06-03 DIAGNOSIS — K769 Liver disease, unspecified: Secondary | ICD-10-CM

## 2013-06-03 DIAGNOSIS — E876 Hypokalemia: Secondary | ICD-10-CM

## 2013-06-03 DIAGNOSIS — K7460 Unspecified cirrhosis of liver: Secondary | ICD-10-CM

## 2013-06-03 DIAGNOSIS — R7302 Impaired glucose tolerance (oral): Secondary | ICD-10-CM

## 2013-06-03 DIAGNOSIS — R7309 Other abnormal glucose: Secondary | ICD-10-CM

## 2013-06-03 MED ORDER — FUROSEMIDE 20 MG PO TABS: 60.0000 mg | ORAL_TABLET | Freq: Two times a day (BID) | ORAL | Status: DC

## 2013-06-03 MED ORDER — POTASSIUM CHLORIDE ER 10 MEQ PO TBCR: EXTENDED_RELEASE_TABLET | ORAL | Status: DC

## 2013-06-03 MED ORDER — SPIRONOLACTONE 50 MG PO TABS: ORAL_TABLET | ORAL | Status: DC

## 2013-06-03 MED ORDER — FUROSEMIDE 20 MG PO TABS: ORAL_TABLET | ORAL | Status: DC

## 2013-06-03 NOTE — Assessment & Plan Note (Signed)
For f/u lab in 2 wks

## 2013-06-03 NOTE — Progress Notes (Signed)
Subjective:    Patient ID: [name], male    DOB: 12-02-1965, 47 y.o.   MRN: [id number]  HPI Here to f/u, asking for re-start diuretics (off x 2 mo) that he usually takes after being taken off the [facility] transplant list as his condition improved, but then unable to contact (lost the phone number) of the other [facility] clinic representative to which his care was referred; does have a f/u appt scheduled, but did not want to wait until then as LE edema and wt have increased.  Has chronic liver dz, fluid retention, now feels some ascites likely developing but not tense.  Pt denies fever, wt loss, night sweats, loss of appetite, or other constitutional symptoms Past Medical History  Diagnosis Date  . Hernia   . Bipolar affective disorder     [facility]  . Depression   . Seizures     Dr Anne Hahn  . Abuse, drug or alcohol   . Wilson disease   . Encephalopathy   . Thrombocytopenia   . Chronic liver disease and cirrhosis   . History of alcohol abuse    Past Surgical History  Procedure Laterality Date  . Left inguinal hernia  june 2012    [facility]  . Vascular surgery      reports that he has been smoking.  He does not have any smokeless tobacco history on file. He reports that  drinks alcohol. He reports that he does not use illicit drugs. family history includes Heart disease in his other. Allergies  Allergen Reactions  . Codeine    Current Outpatient Prescriptions on File Prior to Visit  Medication Sig Dispense Refill  . levETIRAcetam (KEPPRA) 1000 MG tablet TAKE 1 TABLET BY MOUTH TWICE DAILY  60 tablet  0  . milk thistle 175 MG tablet Take 175 mg by mouth 3 (three) times daily.       [name] omeprazole (PRILOSEC) 40 MG capsule Take 40 mg by mouth 2 (two) times daily.        . potassium gluconate 595 MG TABS Take 595 mg by mouth daily.        . propranolol (INDERAL) 10 MG tablet Take 10 mg by mouth 2 (two) times daily.       . QUEtiapine (SEROQUEL) 100 MG tablet Take  100-150 mg by mouth 2 (two) times daily. Take 1 by mouth every morning and 1 and 1/2 by mouth every evening      . QUEtiapine (SEROQUEL) 100 MG tablet Take 1 and 1/2 by mouth every evening      . rifaximin (XIFAXAN) 550 MG TABS Take 550 mg by mouth 2 (two) times daily.       . sildenafil (VIAGRA) 100 MG tablet Take 0.5-1 tablets (50-100 mg total) by mouth daily as needed for erectile dysfunction.  5 tablet  11  . tadalafil (CIALIS) 20 MG tablet Take 1 tablet (20 mg total) by mouth daily as needed for erectile dysfunction.  3 tablet  0  . zinc gluconate 50 MG tablet Take 50 mg by mouth 3 (three) times daily.       [name] lactulose (CHRONULAC) 10 GM/15ML solution Take 15 mLs (10 g total) by mouth 4 (four) times daily -  with meals and at bedtime.  240 mL  3  . oxyCODONE (OXY IR/ROXICODONE) 5 MG immediate release tablet Take 1 tablet (5 mg total) by mouth every 4 (four) hours as needed.  30 tablet  0  No current facility-administered medications on file prior to visit.   Review of Systems  Constitutional: Negative for unexpected weight change, or unusual diaphoresis  HENT: Negative for tinnitus.   Eyes: Negative for photophobia and visual disturbance.  Respiratory: Negative for choking and stridor.   Gastrointestinal: Negative for vomiting and blood in stool.  Genitourinary: Negative for hematuria and decreased urine volume.  Musculoskeletal: Negative for acute joint swelling Skin: Negative for color change and wound.  Neurological: Negative for tremors and numbness other than noted  Psychiatric/Behavioral: Negative for decreased concentration or  hyperactivity.       Objective:   Physical Exam BP 90/58  Pulse 50  Temp(Src) 97.1 F (36.2 C)  Ht 6' (1.829 m)  Wt 193 lb 6 oz (87.714 kg)  BMI 26.22 kg/m2  SpO2 97% VS noted,  Constitutional: Pt appears well-developed and well-nourished.  HENT: Head: NCAT.  Right Ear: External ear normal.  Left Ear: External ear normal.  Eyes: Conjunctivae  and EOM are normal. Pupils are equal, round, and reactive to light.  Neck: Normal range of motion. Neck supple.  Cardiovascular: Normal rate and regular rhythm.   Pulmonary/Chest: Effort normal and breath sounds normal.  Abd:  Soft, NT, non-distended, + BS Neurological: Pt is alert. Not confused  Skin: Skin is warm. No erythema. 2-3+ bilat LE edema to waist Psychiatric: Pt behavior is normal. Thought content normal.     Assessment & Plan:

## 2013-06-03 NOTE — Assessment & Plan Note (Signed)
With fluid retention, to re-start diuretic with careful attention to blood pressure,  to f/u any worsening symptoms or concerns, f/u GI clinic at [facility] as planned

## 2013-06-03 NOTE — Assessment & Plan Note (Signed)
For /fu bmet in2 wks

## 2013-06-03 NOTE — Patient Instructions (Signed)
Your medications were refilled today as requested - the furosemide, aldactone, and potassium  Please return in 2 wks for LAB only - the "bmet" to make sure the medications are not hurting you  Please continue all other medications as before, and refills have been done if requested. Please have the pharmacy call with any other refills you may need.  Please keep your appointments with your specialists as you have planned - the [facility]

## 2013-06-25 ENCOUNTER — Other Ambulatory Visit: Payer: Self-pay | Admitting: Neurology

## 2013-07-10 ENCOUNTER — Other Ambulatory Visit: Payer: Self-pay

## 2013-07-24 ENCOUNTER — Encounter (HOSPITAL_COMMUNITY): Payer: Self-pay | Admitting: Emergency Medicine

## 2013-07-24 ENCOUNTER — Inpatient Hospital Stay (HOSPITAL_COMMUNITY): Payer: No Typology Code available for payment source

## 2013-07-24 ENCOUNTER — Inpatient Hospital Stay (HOSPITAL_COMMUNITY)
Admission: EM | Admit: 2013-07-24 | Discharge: 2013-08-04 | DRG: 493 | Disposition: A | Discharge status: Home Health | Payer: No Typology Code available for payment source | Attending: Orthopaedic Surgery | Admitting: Orthopaedic Surgery

## 2013-07-24 ENCOUNTER — Emergency Department (HOSPITAL_COMMUNITY): Payer: No Typology Code available for payment source

## 2013-07-24 DIAGNOSIS — R569 Unspecified convulsions: Secondary | ICD-10-CM | POA: Diagnosis present

## 2013-07-24 DIAGNOSIS — D6959 Other secondary thrombocytopenia: Secondary | ICD-10-CM | POA: Diagnosis present

## 2013-07-24 DIAGNOSIS — F319 Bipolar disorder, unspecified: Secondary | ICD-10-CM | POA: Diagnosis present

## 2013-07-24 DIAGNOSIS — K7460 Unspecified cirrhosis of liver: Secondary | ICD-10-CM | POA: Diagnosis present

## 2013-07-24 DIAGNOSIS — E871 Hypo-osmolality and hyponatremia: Secondary | ICD-10-CM | POA: Diagnosis not present

## 2013-07-24 DIAGNOSIS — S82109A Unspecified fracture of upper end of unspecified tibia, initial encounter for closed fracture: Principal | ICD-10-CM | POA: Diagnosis present

## 2013-07-24 DIAGNOSIS — D62 Acute posthemorrhagic anemia: Secondary | ICD-10-CM | POA: Diagnosis not present

## 2013-07-24 DIAGNOSIS — Z7982 Long term (current) use of aspirin: Secondary | ICD-10-CM

## 2013-07-24 DIAGNOSIS — Z23 Encounter for immunization: Secondary | ICD-10-CM

## 2013-07-24 DIAGNOSIS — K7030 Alcoholic cirrhosis of liver without ascites: Secondary | ICD-10-CM | POA: Diagnosis present

## 2013-07-24 DIAGNOSIS — F329 Major depressive disorder, single episode, unspecified: Secondary | ICD-10-CM

## 2013-07-24 DIAGNOSIS — D696 Thrombocytopenia, unspecified: Secondary | ICD-10-CM

## 2013-07-24 DIAGNOSIS — F32A Depression, unspecified: Secondary | ICD-10-CM

## 2013-07-24 DIAGNOSIS — S82209A Unspecified fracture of shaft of unspecified tibia, initial encounter for closed fracture: Secondary | ICD-10-CM | POA: Diagnosis present

## 2013-07-24 DIAGNOSIS — N390 Urinary tract infection, site not specified: Secondary | ICD-10-CM | POA: Diagnosis present

## 2013-07-24 DIAGNOSIS — S82143A Displaced bicondylar fracture of unspecified tibia, initial encounter for closed fracture: Secondary | ICD-10-CM

## 2013-07-24 DIAGNOSIS — S82409A Unspecified fracture of shaft of unspecified fibula, initial encounter for closed fracture: Secondary | ICD-10-CM | POA: Diagnosis present

## 2013-07-24 DIAGNOSIS — Z79899 Other long term (current) drug therapy: Secondary | ICD-10-CM

## 2013-07-24 DIAGNOSIS — S82141A Displaced bicondylar fracture of right tibia, initial encounter for closed fracture: Secondary | ICD-10-CM

## 2013-07-24 DIAGNOSIS — F17200 Nicotine dependence, unspecified, uncomplicated: Secondary | ICD-10-CM | POA: Diagnosis present

## 2013-07-24 DIAGNOSIS — G40909 Epilepsy, unspecified, not intractable, without status epilepticus: Secondary | ICD-10-CM | POA: Diagnosis present

## 2013-07-24 DIAGNOSIS — K769 Liver disease, unspecified: Secondary | ICD-10-CM

## 2013-07-24 LAB — URINALYSIS, ROUTINE W REFLEX MICROSCOPIC
Bilirubin Urine: NEGATIVE
Glucose, UA: NEGATIVE mg/dL
Hgb urine dipstick: NEGATIVE
Ketones, ur: NEGATIVE mg/dL
Leukocytes, UA: NEGATIVE
Nitrite: NEGATIVE
Protein, ur: NEGATIVE mg/dL
Specific Gravity, Urine: 1.021 (ref 1.005–1.030)
Urobilinogen, UA: 2 mg/dL — ABNORMAL HIGH (ref 0.0–1.0)
pH: 7 (ref 5.0–8.0)

## 2013-07-24 LAB — COMPREHENSIVE METABOLIC PANEL
ALT: 40 U/L (ref 0–53)
AST: 43 U/L — ABNORMAL HIGH (ref 0–37)
Albumin: 2.8 g/dL — ABNORMAL LOW (ref 3.5–5.2)
Alkaline Phosphatase: 118 U/L — ABNORMAL HIGH (ref 39–117)
BUN: 13 mg/dL (ref 6–23)
CO2: 23 mEq/L (ref 19–32)
Calcium: 9.3 mg/dL (ref 8.4–10.5)
Chloride: 104 mEq/L (ref 96–112)
Creatinine, Ser: 0.76 mg/dL (ref 0.50–1.35)
GFR calc Af Amer: >90 mL/min (ref >90)
GFR calc non Af Amer: >90 mL/min (ref >90)
Glucose, Bld: 153 mg/dL — ABNORMAL HIGH (ref 70–99)
Potassium: 4.3 mEq/L (ref 3.5–5.1)
Sodium: 134 mEq/L — ABNORMAL LOW (ref 135–145)
Total Bilirubin: 2.7 mg/dL — ABNORMAL HIGH (ref 0.3–1.2)
Total Protein: 6 g/dL (ref 6.0–8.3)

## 2013-07-24 LAB — CBC
HCT: 39.1 % (ref 39.0–52.0)
Hemoglobin: 13.6 g/dL (ref 13.0–17.0)
MCH: 34.2 pg — ABNORMAL HIGH (ref 26.0–34.0)
MCHC: 34.8 g/dL (ref 30.0–36.0)
MCV: 98.2 fL (ref 78.0–100.0)
Platelets: 58 10*3/uL — ABNORMAL LOW (ref 150–400)
RBC: 3.98 MIL/uL — ABNORMAL LOW (ref 4.22–5.81)
RDW: 15.4 % (ref 11.5–15.5)
WBC: 6.7 10*3/uL (ref 4.0–10.5)

## 2013-07-24 LAB — PROTIME-INR
INR: 1.41 (ref 0.00–1.49)
Prothrombin Time: 16.9 seconds — ABNORMAL HIGH (ref 11.6–15.2)

## 2013-07-24 LAB — APTT: aPTT: 34 seconds (ref 24–37)

## 2013-07-24 LAB — TYPE AND SCREEN
ABO/RH(D): A POS
Antibody Screen: NEGATIVE

## 2013-07-24 LAB — ABO/RH: ABO/RH(D): A POS

## 2013-07-24 MED ORDER — METHOCARBAMOL 100 MG/ML IJ SOLN
500.0000 mg | Freq: Four times a day (QID) | INTRAVENOUS | Status: DC | PRN
  Filled 2013-07-24: qty 5

## 2013-07-24 MED ORDER — MORPHINE SULFATE 2 MG/ML IJ SOLN
0.5000 mg | INTRAMUSCULAR | Status: DC | PRN
  Administered 2013-07-24: 0.5 mg via INTRAVENOUS
  Filled 2013-07-24: qty 1

## 2013-07-24 MED ORDER — MORPHINE SULFATE 4 MG/ML IJ SOLN
4.0000 mg | Freq: Once | INTRAMUSCULAR | Status: AC
  Administered 2013-07-24: 4 mg via INTRAVENOUS
  Filled 2013-07-24: qty 1

## 2013-07-24 MED ORDER — OXYCODONE HCL 5 MG PO TABS
5.0000 mg | ORAL_TABLET | ORAL | Status: DC | PRN
  Administered 2013-07-24 – 2013-07-26 (×5): 10 mg via ORAL
  Filled 2013-07-24 (×7): qty 2

## 2013-07-24 MED ORDER — METHOCARBAMOL 500 MG PO TABS
500.0000 mg | ORAL_TABLET | Freq: Four times a day (QID) | ORAL | Status: DC | PRN
  Administered 2013-07-24 – 2013-07-25 (×3): 500 mg via ORAL
  Filled 2013-07-24 (×3): qty 1

## 2013-07-24 MED ORDER — ONDANSETRON HCL 4 MG/2ML IJ SOLN
4.0000 mg | Freq: Once | INTRAMUSCULAR | Status: AC
  Administered 2013-07-24: 4 mg via INTRAVENOUS
  Filled 2013-07-24: qty 2
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
## 2013-07-24 NOTE — ED Notes (Addendum)
Pt concderned about getting his evening dose of Kepra for siezure condition
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
## 2013-07-24 NOTE — ED Notes (Signed)
Pt has been talking on his cell phone since arrival to room.
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
## 2013-07-24 NOTE — ED Notes (Signed)
Pt taken to xray 

## 2013-07-24 NOTE — Progress Notes (Signed)
Orthopedic Tech Progress Note Patient Details:  [name] October 06, 1965 [id number]  Ortho Devices Type of Ortho Device: Knee Immobilizer Ortho Device/Splint Location: RLE Ortho Device/Splint Interventions: Ordered;Application   Jennye Moccasin 07/24/2013, 10:04 PM

## 2013-07-24 NOTE — ED Notes (Signed)
Roommate Dan Maker asked to be emergency contact and to be called for updates and any changes to plan of care.

## 2013-07-25 ENCOUNTER — Inpatient Hospital Stay (HOSPITAL_COMMUNITY): Payer: No Typology Code available for payment source

## 2013-07-25 ENCOUNTER — Encounter (HOSPITAL_COMMUNITY)
Admission: EM | Disposition: A | Discharge status: Home Health | Payer: Self-pay | Source: Home / Self Care | Attending: Orthopaedic Surgery

## 2013-07-25 ENCOUNTER — Encounter (HOSPITAL_COMMUNITY): Payer: No Typology Code available for payment source | Admitting: Anesthesiology

## 2013-07-25 ENCOUNTER — Inpatient Hospital Stay (HOSPITAL_COMMUNITY): Payer: No Typology Code available for payment source | Admitting: Anesthesiology

## 2013-07-25 DIAGNOSIS — S82109A Unspecified fracture of upper end of unspecified tibia, initial encounter for closed fracture: Secondary | ICD-10-CM | POA: Diagnosis present

## 2013-07-25 DIAGNOSIS — S82209A Unspecified fracture of shaft of unspecified tibia, initial encounter for closed fracture: Secondary | ICD-10-CM | POA: Diagnosis present

## 2013-07-25 DIAGNOSIS — F319 Bipolar disorder, unspecified: Secondary | ICD-10-CM | POA: Diagnosis present

## 2013-07-25 DIAGNOSIS — N390 Urinary tract infection, site not specified: Secondary | ICD-10-CM | POA: Diagnosis present

## 2013-07-25 DIAGNOSIS — E871 Hypo-osmolality and hyponatremia: Secondary | ICD-10-CM | POA: Diagnosis not present

## 2013-07-25 DIAGNOSIS — Z7982 Long term (current) use of aspirin: Secondary | ICD-10-CM | POA: Diagnosis not present

## 2013-07-25 DIAGNOSIS — G40909 Epilepsy, unspecified, not intractable, without status epilepticus: Secondary | ICD-10-CM | POA: Diagnosis present

## 2013-07-25 DIAGNOSIS — D6959 Other secondary thrombocytopenia: Secondary | ICD-10-CM | POA: Diagnosis present

## 2013-07-25 DIAGNOSIS — K7030 Alcoholic cirrhosis of liver without ascites: Secondary | ICD-10-CM | POA: Diagnosis present

## 2013-07-25 DIAGNOSIS — M79609 Pain in unspecified limb: Secondary | ICD-10-CM | POA: Diagnosis present

## 2013-07-25 DIAGNOSIS — F17200 Nicotine dependence, unspecified, uncomplicated: Secondary | ICD-10-CM | POA: Diagnosis present

## 2013-07-25 DIAGNOSIS — D62 Acute posthemorrhagic anemia: Secondary | ICD-10-CM | POA: Diagnosis not present

## 2013-07-25 DIAGNOSIS — Z23 Encounter for immunization: Secondary | ICD-10-CM | POA: Diagnosis not present

## 2013-07-25 DIAGNOSIS — Z79899 Other long term (current) drug therapy: Secondary | ICD-10-CM | POA: Diagnosis not present

## 2013-07-25 HISTORY — PX: OPEN REDUCTION INTERNAL FIXATION (ORIF) TIBIA/FIBULA FRACTURE: SHX5992

## 2013-07-25 LAB — SURGICAL PCR SCREEN
MRSA, PCR: NEGATIVE
Staphylococcus aureus: NEGATIVE

## 2013-07-25 SURGERY — OPEN REDUCTION INTERNAL FIXATION (ORIF) TIBIA/FIBULA FRACTURE
Anesthesia: General | Site: Leg Lower | Laterality: Right | Wound class: Clean

## 2013-07-25 MED ORDER — OXYCODONE HCL 5 MG PO TABS: 5.0000 mg | ORAL_TABLET | Freq: Once | ORAL | Status: AC | PRN

## 2013-07-25 MED ORDER — INFLUENZA VAC SPLIT QUAD 0.5 ML IM SUSP
0.5000 mL | INTRAMUSCULAR | Status: AC
  Administered 2013-07-28: 0.5 mL via INTRAMUSCULAR
  Filled 2013-07-25: qty 0.5

## 2013-07-25 MED ORDER — PROPRANOLOL HCL 10 MG PO TABS
10.0000 mg | ORAL_TABLET | Freq: Two times a day (BID) | ORAL | Status: DC
  Administered 2013-07-25 (×2): 5 mg via ORAL
  Administered 2013-07-26 – 2013-08-04 (×18): 10 mg via ORAL
  Filled 2013-07-25 (×22): qty 1

## 2013-07-25 MED ORDER — ROCURONIUM BROMIDE 100 MG/10ML IV SOLN
INTRAVENOUS | Status: DC | PRN
  Administered 2013-07-25: 10 mg via INTRAVENOUS
  Administered 2013-07-25: 40 mg via INTRAVENOUS

## 2013-07-25 MED ORDER — MIDAZOLAM HCL 5 MG/5ML IJ SOLN
INTRAMUSCULAR | Status: DC | PRN
  Administered 2013-07-25: 1 mg via INTRAVENOUS

## 2013-07-25 MED ORDER — SUCCINYLCHOLINE CHLORIDE 20 MG/ML IJ SOLN
INTRAMUSCULAR | Status: DC | PRN
  Administered 2013-07-25: 100 mg via INTRAVENOUS

## 2013-07-25 MED ORDER — GLYCOPYRROLATE 0.2 MG/ML IJ SOLN
INTRAMUSCULAR | Status: DC | PRN
  Administered 2013-07-25: 0.4 mg via INTRAVENOUS

## 2013-07-25 MED ORDER — CEFAZOLIN SODIUM-DEXTROSE 2-3 GM-% IV SOLR
INTRAVENOUS | Status: DC | PRN
  Administered 2013-07-25: 2 g via INTRAVENOUS

## 2013-07-25 MED ORDER — PNEUMOCOCCAL VAC POLYVALENT 25 MCG/0.5ML IJ INJ
0.5000 mL | INJECTION | INTRAMUSCULAR | Status: AC
  Administered 2013-07-28: 0.5 mL via INTRAMUSCULAR
  Filled 2013-07-25: qty 0.5

## 2013-07-25 MED ORDER — 0.9 % SODIUM CHLORIDE (POUR BTL) OPTIME
TOPICAL | Status: DC | PRN
  Administered 2013-07-25: 1000 mL

## 2013-07-25 MED ORDER — LIDOCAINE HCL (CARDIAC) 20 MG/ML IV SOLN
INTRAVENOUS | Status: DC | PRN
  Administered 2013-07-25: 80 mg via INTRAVENOUS

## 2013-07-25 MED ORDER — SPIRONOLACTONE 50 MG PO TABS
150.0000 mg | ORAL_TABLET | Freq: Every day | ORAL | Status: DC
  Administered 2013-07-26 – 2013-08-04 (×10): 150 mg via ORAL
  Filled 2013-07-25 (×11): qty 1

## 2013-07-25 MED ORDER — FENTANYL CITRATE 0.05 MG/ML IJ SOLN
INTRAMUSCULAR | Status: DC | PRN
  Administered 2013-07-25 (×2): 100 ug via INTRAVENOUS
  Administered 2013-07-25: 50 ug via INTRAVENOUS

## 2013-07-25 MED ORDER — HYDROMORPHONE HCL PF 1 MG/ML IJ SOLN
0.5000 mg | INTRAMUSCULAR | Status: DC | PRN
  Administered 2013-07-25: 0.5 mg via INTRAVENOUS
  Administered 2013-07-25 (×2): 1 mg via INTRAVENOUS
  Administered 2013-07-25 (×2): 0.5 mg via INTRAVENOUS
  Administered 2013-07-26 (×3): 1 mg via INTRAVENOUS
  Filled 2013-07-25 (×7): qty 1

## 2013-07-25 MED ORDER — LEVETIRACETAM 500 MG PO TABS
1000.0000 mg | ORAL_TABLET | Freq: Two times a day (BID) | ORAL | Status: DC
  Administered 2013-07-25 – 2013-08-04 (×21): 1000 mg via ORAL
  Filled 2013-07-25 (×23): qty 2

## 2013-07-25 MED ORDER — HYDROMORPHONE HCL PF 1 MG/ML IJ SOLN: 0.2500 mg | INTRAMUSCULAR | Status: DC | PRN

## 2013-07-25 MED ORDER — ZINC SULFATE 220 (50 ZN) MG PO CAPS
220.0000 mg | ORAL_CAPSULE | Freq: Three times a day (TID) | ORAL | Status: DC
  Administered 2013-07-25 – 2013-08-04 (×30): 220 mg via ORAL
  Filled 2013-07-25 (×33): qty 1

## 2013-07-25 MED ORDER — OXYCODONE HCL 5 MG/5ML PO SOLN: 5.0000 mg | Freq: Once | ORAL | Status: AC | PRN

## 2013-07-25 MED ORDER — HYDROMORPHONE HCL PF 1 MG/ML IJ SOLN
INTRAMUSCULAR | Status: AC
  Filled 2013-07-25: qty 1

## 2013-07-25 MED ORDER — RIFAXIMIN 550 MG PO TABS
550.0000 mg | ORAL_TABLET | Freq: Two times a day (BID) | ORAL | Status: DC
  Administered 2013-07-25 – 2013-08-04 (×20): 550 mg via ORAL
  Filled 2013-07-25 (×22): qty 1

## 2013-07-25 MED ORDER — ONDANSETRON HCL 4 MG/2ML IJ SOLN
INTRAMUSCULAR | Status: DC | PRN
  Administered 2013-07-25: 4 mg via INTRAVENOUS

## 2013-07-25 MED ORDER — POTASSIUM CHLORIDE CRYS ER 10 MEQ PO TBCR
10.0000 meq | EXTENDED_RELEASE_TABLET | Freq: Two times a day (BID) | ORAL | Status: DC
  Administered 2013-07-25: 10 meq via ORAL
  Filled 2013-07-25 (×2): qty 1

## 2013-07-25 MED ORDER — ONDANSETRON HCL 4 MG/2ML IJ SOLN: 4.0000 mg | Freq: Four times a day (QID) | INTRAMUSCULAR | Status: DC | PRN

## 2013-07-25 MED ORDER — NEOSTIGMINE METHYLSULFATE 1 MG/ML IJ SOLN
INTRAMUSCULAR | Status: DC | PRN
  Administered 2013-07-25: 3.5 mg via INTRAVENOUS

## 2013-07-25 MED ORDER — FUROSEMIDE 40 MG PO TABS
60.0000 mg | ORAL_TABLET | Freq: Two times a day (BID) | ORAL | Status: DC
  Filled 2013-07-25 (×3): qty 1

## 2013-07-25 MED ORDER — PROPOFOL 10 MG/ML IV BOLUS
INTRAVENOUS | Status: DC | PRN
  Administered 2013-07-25: 150 mg via INTRAVENOUS

## 2013-07-25 MED ORDER — DIPHENHYDRAMINE HCL 25 MG PO CAPS
25.0000 mg | ORAL_CAPSULE | Freq: Four times a day (QID) | ORAL | Status: DC | PRN
  Administered 2013-07-25: 25 mg via ORAL
  Filled 2013-07-25 (×2): qty 1

## 2013-07-25 MED ORDER — CEFAZOLIN SODIUM-DEXTROSE 2-3 GM-% IV SOLR
INTRAVENOUS | Status: AC
  Filled 2013-07-25: qty 50

## 2013-07-25 MED ORDER — LACTATED RINGERS IV SOLN
INTRAVENOUS | Status: DC
  Administered 2013-07-25 (×4): via INTRAVENOUS

## 2013-07-25 SURGICAL SUPPLY — 55 items
BANDAGE ELASTIC 4 VELCRO ST LF (GAUZE/BANDAGES/DRESSINGS) ×2 IMPLANT
BANDAGE ELASTIC 6 VELCRO ST LF (GAUZE/BANDAGES/DRESSINGS) ×2 IMPLANT
BANDAGE ESMARK 6X9 LF (GAUZE/BANDAGES/DRESSINGS) ×1 IMPLANT
BIT DRILL 3.5 QC 155 (BIT) ×2 IMPLANT
BIT DRILL 3.5 QC 7IN (BIT) ×2 IMPLANT
BIT DRILL QC 2.7 6.3IN  SHORT (BIT) ×1
BIT DRILL QC 2.7 6.3IN SHORT (BIT) ×1 IMPLANT
BNDG ESMARK 6X9 LF (GAUZE/BANDAGES/DRESSINGS) ×2
BONE CHIP PRESERV 20CC (Bone Implant) ×2 IMPLANT
CANISTER SUCTION 2500CC (MISCELLANEOUS) ×2 IMPLANT
CLOTH BEACON ORANGE TIMEOUT ST (SAFETY) IMPLANT
COVER MAYO STAND STRL (DRAPES) ×2 IMPLANT
CUFF TOURNIQUET SINGLE 34IN LL (TOURNIQUET CUFF) ×2 IMPLANT
DRAPE C-ARM 42X72 X-RAY (DRAPES) ×2 IMPLANT
DRAPE C-ARMOR (DRAPES) ×2 IMPLANT
DRAPE EXTREMITY T 121X128X90 (DRAPE) ×2 IMPLANT
DRAPE POUCH INSTRU U-SHP 10X18 (DRAPES) ×2 IMPLANT
DRAPE U-SHAPE 47X51 STRL (DRAPES) ×2 IMPLANT
DRAPE UTILITY XL STRL (DRAPES) ×4 IMPLANT
DURAPREP 26ML APPLICATOR (WOUND CARE) ×2 IMPLANT
ELECT REM PT RETURN 9FT ADLT (ELECTROSURGICAL) ×2
ELECTRODE REM PT RTRN 9FT ADLT (ELECTROSURGICAL) ×1 IMPLANT
GAUZE XEROFORM 1X8 LF (GAUZE/BANDAGES/DRESSINGS) ×4 IMPLANT
GAUZE XEROFORM 5X9 LF (GAUZE/BANDAGES/DRESSINGS) ×2 IMPLANT
GLOVE SURG SS PI 7.5 STRL IVOR (GLOVE) ×4 IMPLANT
GOWN STRL REIN XL XLG (GOWN DISPOSABLE) ×4 IMPLANT
K-WIRE 2.0 (WIRE) ×5
K-WIRE TROCAR PT 2.0 150MM (WIRE) ×5
KIT BASIN OR (CUSTOM PROCEDURE TRAY) ×2 IMPLANT
KWIRE TROCAR PT 2.0 150MM (WIRE) ×5 IMPLANT
MANIFOLD NEPTUNE II (INSTRUMENTS) IMPLANT
NS IRRIG 1000ML POUR BTL (IV SOLUTION) ×2 IMPLANT
PACK TOTAL JOINT (CUSTOM PROCEDURE TRAY) ×2 IMPLANT
PAD CAST 4YDX4 CTTN HI CHSV (CAST SUPPLIES) ×2 IMPLANT
PADDING CAST COTTON 4X4 STRL (CAST SUPPLIES) ×2
PLATE 10HOLE (Plate) ×2 IMPLANT
PLATE TIBIA PROX 13 HOLE R 4.5 (Plate) ×2 IMPLANT
PUTTY BONE DBX 5CC MIX (Putty) ×2 IMPLANT
SCREW 4.5X30 (Screw) ×6 IMPLANT
SCREW 4.5X38MM (Screw) ×2 IMPLANT
SCREW 6.5X85MM (Screw) ×2 IMPLANT
SCREW CANN LOCK 5.7X75MM (Screw) ×4 IMPLANT
SCREW CANN LOCK 5.7X80MM (Screw) ×2 IMPLANT
SCREW LOCKING 5.7X60 (Screw) ×2 IMPLANT
SCREW NL 3.5X14 (Screw) ×2 IMPLANT
SCREW NON LOCK 3.5X12 (Screw) ×6 IMPLANT
SPONGE GAUZE 4X4 12PLY (GAUZE/BANDAGES/DRESSINGS) ×2 IMPLANT
STAPLER SKIN PROX WIDE 3.9 (STAPLE) ×4 IMPLANT
SUT PDS AB 0 CT 36 (SUTURE) ×2 IMPLANT
SUT VIC AB 0 CT1 27 (SUTURE) ×2
SUT VIC AB 0 CT1 27XBRD ANTBC (SUTURE) ×2 IMPLANT
SUT VIC AB 2-0 CT1 27 (SUTURE) ×2
SUT VIC AB 2-0 CT1 TAPERPNT 27 (SUTURE) ×2 IMPLANT
TOWEL OR 17X26 10 PK STRL BLUE (TOWEL DISPOSABLE) ×4 IMPLANT
WATER STERILE IRR 1000ML POUR (IV SOLUTION) IMPLANT
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
## 2013-07-25 NOTE — ED Provider Notes (Signed)
I saw and evaluated the patient, reviewed the resident's note and I agree with the findings and plan.  Jonpaul Lumm T Meghana Tullo, MD 07/25/13 1539 

## 2013-07-25 NOTE — Progress Notes (Signed)
UR completed. Gerik Coberly RN CCM Case Mgmt 

## 2013-07-25 NOTE — Preoperative (Signed)
Beta Blockers   Reason not to administer Beta Blockers:Not Applicable 

## 2013-07-25 NOTE — Anesthesia Procedure Notes (Signed)
Procedure Name: Intubation Date/Time: 07/25/2013 8:24 PM Performed by: Molli Hazard Pre-anesthesia Checklist: Patient identified, Emergency Drugs available, Suction available and Patient being monitored Patient Re-evaluated:Patient Re-evaluated prior to inductionOxygen Delivery Method: Circle system utilized Preoxygenation: Pre-oxygenation with 100% oxygen Intubation Type: IV induction and Rapid sequence Laryngoscope Size: Miller and 2 Grade View: Grade II Tube type: Oral Tube size: 7.5 mm Number of attempts: 1 Airway Equipment and Method: Stylet Placement Confirmation: ETT inserted through vocal cords under direct vision,  positive ETCO2 and breath sounds checked- equal and bilateral Secured at: 22 cm Tube secured with: Tape Dental Injury: Teeth and Oropharynx as per pre-operative assessment

## 2013-07-25 NOTE — Anesthesia Preprocedure Evaluation (Signed)
Anesthesia Evaluation  Patient identified by MRN, date of birth, ID band Patient awake    Reviewed: Allergy & Precautions, H&P , NPO status , Patient's Chart, lab work & pertinent test results  Airway Mallampati: II  Neck ROM: full    Dental   Pulmonary Current Smoker,          Cardiovascular     Neuro/Psych Seizures -,  Depression Bipolar Disorder    GI/Hepatic (+)     substance abuse  alcohol use, Hepatitis -Wilson's disease   Endo/Other    Renal/GU      Musculoskeletal   Abdominal   Peds  Hematology   Anesthesia Other Findings   Reproductive/Obstetrics                           Anesthesia Physical Anesthesia Plan  ASA: II  Anesthesia Plan: General   Post-op Pain Management:    Induction: Intravenous  Airway Management Planned: Oral ETT  Additional Equipment:   Intra-op Plan:   Post-operative Plan: Extubation in OR  Informed Consent: I have reviewed the patients History and Physical, chart, labs and discussed the procedure including the risks, benefits and alternatives for the proposed anesthesia with the patient or authorized representative who has indicated his/her understanding and acceptance.     Plan Discussed with: CRNA, Anesthesiologist and Surgeon  Anesthesia Plan Comments:         Anesthesia Quick Evaluation

## 2013-07-25 NOTE — Progress Notes (Signed)
Orthopedic Tech Progress Note Patient Details:  [name] [birth year] [id number]  Patient ID: [name], male   DOB: 11-29-1965, 47 y.o.   MRN: [id number]   [name] 07/25/2013, 2:14 PMTrapeze bar

## 2013-07-25 NOTE — Progress Notes (Signed)
Report given to Lisa, RN.

## 2013-07-25 NOTE — Transfer of Care (Signed)
Immediate Anesthesia Transfer of Care Note  Patient: [name]  Procedure(s) Performed: Procedure(s): OPEN REDUCTION INTERNAL FIXATION (ORIF) RIGHT TIBIAL PLATEAU, TIBIAL SHAFT AND FIBULA FRACTURES, POSSIBLE FASCIOTOMIES (Right)  Patient Location: PACU  Anesthesia Type:General  Level of Consciousness: awake, sedated and patient cooperative  Airway & Oxygen Therapy: Patient Spontanous Breathing and Patient connected to nasal cannula oxygen  Post-op Assessment: Report given to PACU RN, Post -op Vital signs reviewed and stable and Patient moving all extremities X 4  Post vital signs: Reviewed and stable  Complications: No apparent anesthesia complications

## 2013-07-25 NOTE — Progress Notes (Signed)
I have spoken with Dr. Roda Shutters who has kindly agreed to see patient in am and discuss definitive surgical intervention

## 2013-07-25 NOTE — Anesthesia Postprocedure Evaluation (Signed)
Anesthesia Post Note  Patient: Charley Russey  Procedure(s) Performed: Procedure(s) (LRB): OPEN REDUCTION INTERNAL FIXATION (ORIF) RIGHT TIBIAL PLATEAU, TIBIAL SHAFT AND FIBULA FRACTURES, POSSIBLE FASCIOTOMIES (Right)  Anesthesia type: General  Patient location: PACU  Post pain: Pain level controlled and Adequate analgesia  Post assessment: Post-op Vital signs reviewed, Patient's Cardiovascular Status Stable, Respiratory Function Stable, Patent Airway and Pain level controlled  Last Vitals:  Filed Vitals:   07/25/13 2330  BP: 136/64  Pulse: 67  Temp:   Resp: 9    Post vital signs: Reviewed and stable  Level of consciousness: awake, alert  and oriented  Complications: No apparent anesthesia complications

## 2013-07-25 NOTE — Brief Op Note (Signed)
   Brief Op Note  Date of Surgery: 07/25/2013  Preoperative Diagnosis: Right tibia fracture Right fibula fracture  Postoperative Diagnosis: same  Procedure: Procedure(s): OPEN REDUCTION INTERNAL FIXATION (ORIF) RIGHT TIBIAL PLATEAU, TIBIAL SHAFT AND FIBULA FRACTURES, POSSIBLE FASCIOTOMIES  Implants: Katrinka Blazing and Nephew  Surgeons: Surgeon(s): Naiping Glee Arvin, MD  Anesthesia: General  Drains: none  Estimated Blood Loss: * No blood loss amount entered *  Complications: None  Condition to PACU: Stable  Naiping Glee Arvin, MD [facility] 07/25/2013 11:06 PM

## 2013-07-26 DIAGNOSIS — K769 Liver disease, unspecified: Secondary | ICD-10-CM

## 2013-07-26 DIAGNOSIS — D696 Thrombocytopenia, unspecified: Secondary | ICD-10-CM

## 2013-07-26 DIAGNOSIS — R569 Unspecified convulsions: Secondary | ICD-10-CM

## 2013-07-26 DIAGNOSIS — S82109A Unspecified fracture of upper end of unspecified tibia, initial encounter for closed fracture: Principal | ICD-10-CM

## 2013-07-26 LAB — COMPREHENSIVE METABOLIC PANEL
ALT: 31 U/L (ref 0–53)
AST: 47 U/L — ABNORMAL HIGH (ref 0–37)
Albumin: 2.2 g/dL — ABNORMAL LOW (ref 3.5–5.2)
Alkaline Phosphatase: 95 U/L (ref 39–117)
BUN: 12 mg/dL (ref 6–23)
CO2: 25 mEq/L (ref 19–32)
Calcium: 8.7 mg/dL (ref 8.4–10.5)
Chloride: 102 mEq/L (ref 96–112)
Creatinine, Ser: 0.72 mg/dL (ref 0.50–1.35)
GFR calc Af Amer: >90 mL/min (ref >90)
GFR calc non Af Amer: >90 mL/min (ref >90)
Glucose, Bld: 105 mg/dL — ABNORMAL HIGH (ref 70–99)
Potassium: 4.7 mEq/L (ref 3.5–5.1)
Sodium: 131 mEq/L — ABNORMAL LOW (ref 135–145)
Total Bilirubin: 3.2 mg/dL — ABNORMAL HIGH (ref 0.3–1.2)
Total Protein: 4.8 g/dL — ABNORMAL LOW (ref 6.0–8.3)

## 2013-07-26 LAB — CBC WITH DIFFERENTIAL/PLATELET
Basophils Absolute: 0 10*3/uL (ref 0.0–0.1)
Basophils Relative: 0 % (ref 0–1)
Eosinophils Absolute: 0.1 10*3/uL (ref 0.0–0.7)
Eosinophils Relative: 1 % (ref 0–5)
HCT: 29.9 % — ABNORMAL LOW (ref 39.0–52.0)
Hemoglobin: 10.4 g/dL — ABNORMAL LOW (ref 13.0–17.0)
Lymphocytes Relative: 14 % (ref 12–46)
Lymphs Abs: 1.1 10*3/uL (ref 0.7–4.0)
MCH: 34.1 pg — ABNORMAL HIGH (ref 26.0–34.0)
MCHC: 34.8 g/dL (ref 30.0–36.0)
MCV: 98 fL (ref 78.0–100.0)
Monocytes Absolute: 1.8 10*3/uL — ABNORMAL HIGH (ref 0.1–1.0)
Monocytes Relative: 24 % — ABNORMAL HIGH (ref 3–12)
Neutro Abs: 4.6 10*3/uL (ref 1.7–7.7)
Neutrophils Relative %: 61 % (ref 43–77)
Platelets: 45 10*3/uL — ABNORMAL LOW (ref 150–400)
RBC: 3.05 MIL/uL — ABNORMAL LOW (ref 4.22–5.81)
RDW: 15.2 % (ref 11.5–15.5)
WBC: 7.5 10*3/uL (ref 4.0–10.5)

## 2013-07-26 MED ORDER — HYDROCODONE-ACETAMINOPHEN 5-325 MG PO TABS
1.0000 | ORAL_TABLET | ORAL | Status: DC | PRN
  Administered 2013-07-26 – 2013-08-04 (×34): 2 via ORAL
  Filled 2013-07-26 (×35): qty 2

## 2013-07-26 MED ORDER — SPIRONOLACTONE 50 MG PO TABS: 150.0000 mg | ORAL_TABLET | Freq: Every day | ORAL | Status: DC

## 2013-07-26 MED ORDER — DIPHENHYDRAMINE HCL 12.5 MG/5ML PO ELIX: 25.0000 mg | ORAL_SOLUTION | ORAL | Status: DC | PRN

## 2013-07-26 MED ORDER — MAGNESIUM CITRATE PO SOLN: 1.0000 | Freq: Once | ORAL | Status: AC | PRN

## 2013-07-26 MED ORDER — ONDANSETRON HCL 4 MG PO TABS: 4.0000 mg | ORAL_TABLET | Freq: Four times a day (QID) | ORAL | Status: DC | PRN

## 2013-07-26 MED ORDER — CEFAZOLIN SODIUM-DEXTROSE 2-3 GM-% IV SOLR
2.0000 g | Freq: Four times a day (QID) | INTRAVENOUS | Status: AC
  Administered 2013-07-26 (×3): 2 g via INTRAVENOUS
  Filled 2013-07-26 (×3): qty 50

## 2013-07-26 MED ORDER — ONDANSETRON HCL 4 MG/2ML IJ SOLN: 4.0000 mg | Freq: Four times a day (QID) | INTRAMUSCULAR | Status: DC | PRN

## 2013-07-26 MED ORDER — ASPIRIN EC 325 MG PO TBEC
325.0000 mg | DELAYED_RELEASE_TABLET | Freq: Two times a day (BID) | ORAL | Status: DC
  Administered 2013-07-26 – 2013-08-04 (×19): 325 mg via ORAL
  Filled 2013-07-26 (×21): qty 1

## 2013-07-26 MED ORDER — POTASSIUM CHLORIDE CRYS ER 10 MEQ PO TBCR
10.0000 meq | EXTENDED_RELEASE_TABLET | Freq: Two times a day (BID) | ORAL | Status: DC
  Administered 2013-07-26 – 2013-08-04 (×19): 10 meq via ORAL
  Filled 2013-07-26 (×20): qty 1

## 2013-07-26 MED ORDER — METOCLOPRAMIDE HCL 10 MG PO TABS: 5.0000 mg | ORAL_TABLET | Freq: Three times a day (TID) | ORAL | Status: DC | PRN

## 2013-07-26 MED ORDER — METOCLOPRAMIDE HCL 5 MG/ML IJ SOLN: 5.0000 mg | Freq: Three times a day (TID) | INTRAMUSCULAR | Status: DC | PRN

## 2013-07-26 MED ORDER — MORPHINE SULFATE 2 MG/ML IJ SOLN: 1.0000 mg | INTRAMUSCULAR | Status: DC | PRN

## 2013-07-26 MED ORDER — SENNA 8.6 MG PO TABS
1.0000 | ORAL_TABLET | Freq: Two times a day (BID) | ORAL | Status: DC
  Administered 2013-07-26 – 2013-08-04 (×16): 8.6 mg via ORAL
  Filled 2013-07-26 (×20): qty 1

## 2013-07-26 MED ORDER — RIFAXIMIN 550 MG PO TABS
550.0000 mg | ORAL_TABLET | Freq: Two times a day (BID) | ORAL | Status: DC
  Administered 2013-07-26: 550 mg via ORAL

## 2013-07-26 MED ORDER — METHOCARBAMOL 100 MG/ML IJ SOLN: 500.0000 mg | Freq: Four times a day (QID) | INTRAVENOUS | Status: DC | PRN

## 2013-07-26 MED ORDER — METHOCARBAMOL 500 MG PO TABS
500.0000 mg | ORAL_TABLET | Freq: Four times a day (QID) | ORAL | Status: DC | PRN
  Administered 2013-07-26 – 2013-08-04 (×31): 500 mg via ORAL
  Filled 2013-07-26 (×34): qty 1

## 2013-07-26 MED ORDER — LEVETIRACETAM 500 MG PO TABS
1000.0000 mg | ORAL_TABLET | Freq: Two times a day (BID) | ORAL | Status: DC
  Administered 2013-07-26: 1000 mg via ORAL

## 2013-07-26 MED ORDER — ZINC GLUCONATE 50 MG PO TABS: 50.0000 mg | ORAL_TABLET | Freq: Three times a day (TID) | ORAL | Status: DC

## 2013-07-26 MED ORDER — POLYETHYLENE GLYCOL 3350 17 G PO PACK
17.0000 g | PACK | Freq: Every day | ORAL | Status: DC | PRN
  Administered 2013-08-01: 17 g via ORAL
  Filled 2013-07-26: qty 1

## 2013-07-26 MED ORDER — SODIUM CHLORIDE 0.9 % IV SOLN
INTRAVENOUS | Status: DC
  Administered 2013-07-26: 05:00:00 via INTRAVENOUS

## 2013-07-26 MED ORDER — SORBITOL 70 % SOLN: 30.0000 mL | Freq: Every day | Status: DC | PRN

## 2013-07-26 MED ORDER — PROPRANOLOL HCL 10 MG PO TABS
10.0000 mg | ORAL_TABLET | Freq: Two times a day (BID) | ORAL | Status: DC
  Administered 2013-07-26: 10 mg via ORAL
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
## 2013-07-26 NOTE — Evaluation (Signed)
Physical Therapy Evaluation Patient Details Name: [name] MRN: [id number] DOB: 07-29-1966 Today's Date: 07/26/2013 Time: 1191-4782 PT Time Calculation (min): 28 min  PT Assessment / Plan / Recommendation History of Present Illness  Closed right lateral tibial plateau fracture with oblique proximal third tibial shaft fracture and minimally displaced distal fibular shaft fracture..  Underwent ORIF 11/21.  Clinical Impression  Patient s/p ORIF right Tib/Fib fractures.  Patient with several comorbidities that may impact progress.  Patient did well with OOB, however, was limited by anxiety and pain.  Patient will benefit from PT to progress mobility and independence.  Patient with limited support at home and will benefit from CIR to maximize progress before d/c home.      PT Assessment  Patient needs continued PT services    Follow Up Recommendations  CIR    Does the patient have the potential to tolerate intense rehabilitation      Barriers to Discharge Decreased caregiver support      Equipment Recommendations  Rolling walker with 5" wheels;3in1 (PT)    Recommendations for Other Services     Frequency Min 6X/week    Precautions / Restrictions Precautions Required Braces or Orthoses: Knee Immobilizer - Right Knee Immobilizer - Right: On at all times Restrictions Weight Bearing Restrictions: Yes RLE Weight Bearing: Non weight bearing   Pertinent Vitals/Pain Pain 6/10 during mobility in right leg.  Patient was pre-medicated prior to therapy      Mobility  Bed Mobility Bed Mobility: Supine to Sit Supine to Sit: 4: Min assist;HOB elevated Details for Bed Mobility Assistance: assistance needed for right LE Transfers Transfers: Sit to Stand;Stand to Sit Sit to Stand: 3: Mod assist;With armrests;From bed Stand to Sit: 3: Mod assist;With upper extremity assist;To chair/3-in-1 Details for Transfer Assistance: required max encouragement; cueing for sequencing and hand  placement Ambulation/Gait Ambulation/Gait Assistance: 4: Min assist Ambulation Distance (Feet): 5 Feet Assistive device: Rolling walker Ambulation/Gait Assistance Details: patient shaky with rolling walker, very anxious about using RW; did well with NWB Gait Pattern: Step-to pattern    Exercises     PT Diagnosis: Difficulty walking;Acute pain  PT Problem List: Decreased strength;Decreased activity tolerance;Decreased knowledge of use of DME;Decreased mobility;Decreased knowledge of precautions;Pain PT Treatment Interventions: Gait training;DME instruction;Stair training;Functional mobility training;Therapeutic activities;Therapeutic exercise;Patient/family education     PT Goals(Current goals can be found in the care plan section) Acute Rehab PT Goals Patient Stated Goal: be safe PT Goal Formulation: With patient Time For Goal Achievement: 08/02/13 Potential to Achieve Goals: Good  Visit Information  Last PT Received On: 07/26/13 Assistance Needed: +1 History of Present Illness: Closed right lateral tibial plateau fracture with oblique proximal third tibial shaft fracture and minimally displaced distal fibular shaft fracture..  Underwent ORIF 11/21.       Prior Functioning  Home Living Family/patient expects to be discharged to:: Private residence Living Arrangements: Alone Available Help at Discharge: Friend(s);Available PRN/intermittently Type of Home: House Home Access: Stairs to enter [employer] of Steps: 3 Entrance Stairs-Rails: None Home Layout: One level Home Equipment: None Additional Comments: has roommate that can build ramp; patient reports he had a large network of friends that can assist as needed at discharge. Prior Function Level of Independence: Independent Communication Communication: No difficulties    Cognition  Cognition Arousal/Alertness: Awake/alert Behavior During Therapy: Anxious Overall Cognitive Status: Within Functional Limits  for tasks assessed    Extremity/Trunk Assessment Upper Extremity Assessment Upper Extremity Assessment: Defer to OT evaluation Lower Extremity Assessment Lower Extremity  Assessment: RLE deficits/detail RLE: Unable to fully assess due to pain;Unable to fully assess due to immobilization   Balance Balance Balance Assessed: No  End of Session PT - End of Session Equipment Utilized During Treatment: Gait belt Activity Tolerance: Patient tolerated treatment well Patient left: in chair;with call bell/phone within reach  GP     Olivia Canter, [place] [phone] 07/26/2013, 12:27 PM

## 2013-07-26 NOTE — Progress Notes (Signed)
OT Cancellation Note  Patient Details Name: [name] MRN: [id number] DOB: 1966-07-03   Cancelled Treatment:    Reason Eval/Treat Not Completed: Fatigue/lethargy limiting ability to participate - attempted to see x 2.  First attempt, pt states pain 10/10.  Returned after pt had pain medication, and pt stated he is too fatigued, and just can't do another thing - "I need my rest".  Pt became irritable when encouraged to work with OT stating that what he did with PT should be enough for a day.  Will re-attempt eval tomorrow.  Pt voiced that he doesn't have assistance at home, and that he needs an inpatient rehab stay.  Explained SNF and CIR options to pt.   Jeani Hawking M [phone] 07/26/2013, 4:25 PM

## 2013-07-26 NOTE — Care Management Note (Unsigned)
    Page 1 of 1   07/26/2013     1:25:14 PM   CARE MANAGEMENT NOTE 07/26/2013  Patient:  RYDEN, WAINER   Account Number:  [id number]  Date Initiated:  07/25/2013  Documentation initiated by:  [facility]  Subjective/Objective Assessment:   Right tibial plateau fracture  Right tibial shaft fracture   Right distal fibula fracture  scheduled surgery 07/24/2013     Action/Plan:   waiting PT/OT evaluation- rec CIR   Anticipated DC Date:  07/28/2013   Anticipated DC Plan:  IP REHAB FACILITY      DC Planning Services  CM consult      Choice offered to / List presented to:             Status of service:  In process, will continue to follow Medicare Important Message given?   (If response is "NO", the following Medicare IM given date fields will be blank) Date Medicare IM given:   Date Additional Medicare IM given:    Discharge Disposition:    Per UR Regulation:    If discussed at Long Length of Stay Meetings, dates discussed:    Comments:  07/26/13 13:21 Letha Cape RN ,BSN [phone] patient has a room mate that lives with him.  Per physical therapy recs CIR.  Will need CIR consult if this is anticipated disposition.  RN states pateint is doing really well, if he conts to do well phsical therapy may rec hh. NCM will continue to follow for dc needs.
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
## 2013-07-26 NOTE — Progress Notes (Signed)
   Subjective:  Patient reports pain as moderate.  No events  Objective:   VITALS:   Filed Vitals:   07/26/13 0015 07/26/13 0223 07/26/13 0614 07/26/13 0800  BP: 134/85 127/63 115/51   Pulse: 73 87 65   Temp:  98.5 F (36.9 C) 99.5 F (37.5 C)   TempSrc:  Oral Oral   Resp:  18 18 18   Height:      Weight:      SpO2: 100% 99% 98% 98%    Neurologically intact Neurovascular intact Sensation intact distally Intact pulses distally Dorsiflexion/Plantar flexion intact Incision: dressing C/D/I and moderate drainage No cellulitis present Compartment soft   Lab Results  Component Value Date   WBC 6.7 07/24/2013   HGB 13.6 07/24/2013   HCT 39.1 07/24/2013   MCV 98.2 07/24/2013   PLT 58* 07/24/2013     Assessment/Plan: 1 Day Post-Op   Problem List Items Addressed This Visit     Musculoskeletal and Integument   *Tibial plateau fracture, tibial shaft fracture - Primary      Advance diet Up with therapy Foley out this am DVT ppx - asa Will consult hospitalist for assistance with management of comorbidities Pain control Discharge planning   Cheral Almas 07/26/2013, 10:05 AM [phone]

## 2013-07-27 LAB — COMPREHENSIVE METABOLIC PANEL
ALT: 29 U/L (ref 0–53)
AST: 64 U/L — ABNORMAL HIGH (ref 0–37)
Albumin: 2.1 g/dL — ABNORMAL LOW (ref 3.5–5.2)
Alkaline Phosphatase: 94 U/L (ref 39–117)
BUN: 11 mg/dL (ref 6–23)
CO2: 26 mEq/L (ref 19–32)
Calcium: 8.4 mg/dL (ref 8.4–10.5)
Chloride: 100 mEq/L (ref 96–112)
Creatinine, Ser: 0.69 mg/dL (ref 0.50–1.35)
GFR calc Af Amer: >90 mL/min (ref >90)
GFR calc non Af Amer: >90 mL/min (ref >90)
Glucose, Bld: 97 mg/dL (ref 70–99)
Potassium: 4.2 mEq/L (ref 3.5–5.1)
Sodium: 130 mEq/L — ABNORMAL LOW (ref 135–145)
Total Bilirubin: 2.5 mg/dL — ABNORMAL HIGH (ref 0.3–1.2)
Total Protein: 4.6 g/dL — ABNORMAL LOW (ref 6.0–8.3)

## 2013-07-27 NOTE — Progress Notes (Signed)
Inpatient rehab consult is pending Monday. Medpay primary with [employer] secondary. [phone]

## 2013-07-27 NOTE — Evaluation (Signed)
Occupational Therapy Evaluation Patient Details Name: [name] MRN: [id number] DOB: 1966-02-16 Today's Date: 07/27/2013 Time: 4098-1191 OT Time Calculation (min): 27 min  OT Assessment / Plan / Recommendation History of present illness Closed right lateral tibial plateau fracture with oblique proximal third tibial shaft fracture and minimally displaced distal fibular shaft fracture..  Underwent ORIF 11/21.   Clinical Impression   Pt presents with below problem list. Pt independent with ADLs, PTA. Pt will benefit from acute OT to increase independence prior to d/c. Recommending CIR for additional rehab prior to d/c home.     OT Assessment  Patient needs continued OT Services    Follow Up Recommendations  CIR    Barriers to Discharge      Equipment Recommendations  3 in 1 bedside comode;Other (comment) (tub equipment tbd)    Recommendations for Other Services Rehab consult  Frequency  Min 2X/week    Precautions / Restrictions Precautions Precautions: Fall Required Braces or Orthoses: Other Brace/Splint (Bledsoe brace) Knee Immobilizer - Right: On at all times Restrictions Weight Bearing Restrictions: Yes RLE Weight Bearing: Non weight bearing   Pertinent Vitals/Pain Pain 7/10. Repositioned.     ADL  Eating/Feeding: Independent Where Assessed - Eating/Feeding: Chair Grooming: Set up Where Assessed - Grooming: Supported sitting Upper Body Bathing: Set up Where Assessed - Upper Body Bathing: Supported sitting Lower Body Bathing: Moderate assistance Where Assessed - Lower Body Bathing: Supported sit to stand Upper Body Dressing: Set up Where Assessed - Upper Body Dressing: Supported sitting Lower Body Dressing: Moderate assistance Where Assessed - Lower Body Dressing: Supported sit to Pharmacist, hospital: Minimal Dentist Method: Sit to Barista: Bedside commode Toileting - Clothing Manipulation and Hygiene: Moderate  assistance (assisted with gown) Where Assessed - Toileting Clothing Manipulation and Hygiene: Sit to stand from 3-in-1 or toilet Tub/Shower Transfer Method: Not assessed Equipment Used: Gait belt;Rolling walker Transfers/Ambulation Related to ADLs: Min A for transfers and pt arms shaky with ambulation ADL Comments: Educated to stand in front of bed/chair with walker in front when pulling up LB clothing. Educated on dressing technique. Educated on use of 3 in 1 over toilet.  Pt practiced donning underwear and able to don/doff left sock.     OT Diagnosis: Acute pain  OT Problem List: Decreased strength;Pain;Decreased knowledge of precautions;Decreased knowledge of use of DME or AE;Decreased range of motion;Decreased activity tolerance;Impaired balance (sitting and/or standing) OT Treatment Interventions: Self-care/ADL training;Therapeutic exercise;DME and/or AE instruction;Therapeutic activities;Patient/family education;Balance training   OT Goals(Current goals can be found in the care plan section) Acute Rehab OT Goals Patient Stated Goal: get back to riding his bike OT Goal Formulation: With patient Time For Goal Achievement: 08/03/13 Potential to Achieve Goals: Good ADL Goals Pt Will Perform Grooming: with supervision;standing Pt Will Perform Lower Body Bathing: with supervision;sit to/from stand Pt Will Perform Lower Body Dressing: with supervision;sit to/from stand Pt Will Transfer to Toilet: ambulating;with supervision (3 in 1 over commode) Pt Will Perform Toileting - Clothing Manipulation and hygiene: with modified independence;sit to/from stand  Visit Information  Last OT Received On: 07/27/13 Assistance Needed: +1 History of Present Illness: Closed right lateral tibial plateau fracture with oblique proximal third tibial shaft fracture and minimally displaced distal fibular shaft fracture..  Underwent ORIF 11/21.       Prior Functioning     Home Living Family/patient expects  to be discharged to:: Private residence Living Arrangements: Alone Available Help at Discharge: Friend(s);Available PRN/intermittently Type of Home: House Home Access:  Stairs to enter [employer] of Steps: 3 Entrance Stairs-Rails: None Home Layout: One level Home Equipment: None Additional Comments: Per PT eval-has roommate that can build ramp; patient reports he had a large network of friends that can assist as needed at discharge. Prior Function Level of Independence: Independent Communication Communication: No difficulties         Vision/Perception     Cognition  Cognition Arousal/Alertness: Awake/alert Behavior During Therapy: WFL for tasks assessed/performed Overall Cognitive Status: Within Functional Limits for tasks assessed    Extremity/Trunk Assessment Upper Extremity Assessment Upper Extremity Assessment: Overall WFL for tasks assessed Lower Extremity Assessment Lower Extremity Assessment: Defer to PT evaluation     Mobility Bed Mobility Bed Mobility: Supine to Sit;Sit to Supine;Scooting to HOB Supine to Sit: 4: Min guard;With rails Sit to Supine: 4: Min assist Scooting to [facility]: 5: Supervision Details for Bed Mobility Assistance: Cues for technique to scoot to [facility] and be sure not to put weight thought RLE. Pt hooked foot to help assist RLE onto bed.  Transfers Transfers: Sit to Stand;Stand to Sit Sit to Stand: 4: Min assist;With upper extremity assist;From bed;From chair/3-in-1 Stand to Sit: 4: Min assist;With upper extremity assist;To bed;To chair/3-in-1 Details for Transfer Assistance: Assisted in holding RLE when going to sitting position.         Balance     End of Session OT - End of Session Equipment Utilized During Treatment: Gait belt;Rolling walker;Other (comment) (Bledsoe brace) Activity Tolerance: Patient tolerated treatment well Patient left: in bed;with call bell/phone within reach Nurse Communication: Mobility status  GO      Earlie Raveling OTR/L [phone] 07/27/2013, 5:43 PM
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
## 2013-07-27 NOTE — Progress Notes (Signed)
   Subjective:  Patient reports pain as moderate.  Reports pain is improved.  Objective:   VITALS:   Filed Vitals:   07/26/13 2000 07/26/13 2157 07/27/13 0601 07/27/13 0800  BP:  96/47 113/52   Pulse:  67 68   Temp:  98.8 F (37.1 C) 98.8 F (37.1 C)   TempSrc:  Oral Oral   Resp: 18 18 18 18   Height:      Weight:      SpO2: 99% 98% 96% 96%    Neurologically intact Neurovascular intact Sensation intact distally Intact pulses distally Dorsiflexion/Plantar flexion intact Incision: dressing C/D/I and no drainage No cellulitis present Compartment soft   Lab Results  Component Value Date   WBC 7.5 07/26/2013   HGB 10.4* 07/26/2013   HCT 29.9* 07/26/2013   MCV 98.0 07/26/2013   PLT 45* 07/26/2013     Assessment/Plan: 2 Days Post-Op   Problem List Items Addressed This Visit     Digestive   Chronic liver disease and cirrhosis     Musculoskeletal and Integument   *Tibial plateau fracture, tibial shaft fracture - Primary     Hematopoietic and Hemostatic   Thrombocytopenia     Other   Seizures   Relevant Medications      levETIRAcetam (KEPPRA) 1000 MG tablet      levETIRAcetam (KEPPRA) tablet 1,000 mg      Advance diet Up with therapy DVT ppx - SCDs, ambulation, asa NWB RLE Dressings changed Bledsoe at all times Pain control Rehab consult placed   Cheral Almas 07/27/2013, 10:34 AM [phone]

## 2013-07-27 NOTE — Progress Notes (Addendum)
Clinical Social Work Department BRIEF PSYCHOSOCIAL ASSESSMENT 07/27/2013  Patient:  [name], [name]     Account Number:  [id number]     Admit date:  07/24/2013  Clinical Social Worker:  Hendricks Milo  Date/Time:  07/27/2013 10:52 AM  Referred by:  Physician  Date Referred:  07/27/2013 Referred for  SNF Placement   Other Referral:   Interview type:  Patient Other interview type:    PSYCHOSOCIAL DATA Living Status:  OTHER Admitted from facility:   Level of care:   Primary support name:  Amada Jupiter [phone] (home) Primary support relationship to patient:  SIBLING Degree of support available:   Amada Jupiter is patient's brother and lives in [place], [place]. Patient reported that Amada Jupiter is supportive as he can be living in [place].    CURRENT CONCERNS  Other Concerns:    SOCIAL WORK ASSESSMENT / PLAN Clinical Social Worker (CSW) met with patient to discuss D/C plan. CSW explained that Physical Therapy is recommending inpatient rehab at [facility] (CIR). CSW explained the difference between CIR and SNF. Patient was agreeable to SNF search in [facility] as a back up to CIR. Patient lives in [place] with a roommate.    Assessment/plan status:  Psychosocial Support/Ongoing Assessment of Needs Other assessment/ plan:   Information/referral to community resources:   CSW gave patient SNF list.    PATIENT'S/FAMILY'S RESPONSE TO PLAN OF CARE: Patient thanked CSW for explaining the different rehab options.

## 2013-07-27 NOTE — Progress Notes (Signed)
Clinical Social Work Department CLINICAL SOCIAL WORK PLACEMENT NOTE 07/27/2013  Patient:  [name], [name]  Account Number:  [id number] Admit date:  07/24/2013  Clinical Social Worker:  Jetta Lout, Theresia Majors  Date/time:  07/27/2013 10:58 AM  Clinical Social Work is seeking post-discharge placement for this patient at the following level of care:   SKILLED NURSING   (*CSW will update this form in Epic as items are completed)   07/27/2013  Patient/family provided with [facility] Department of Clinical Social Work's list of facilities offering this level of care within the geographic area requested by the patient (or if unable, by the patient's family).  07/27/2013  Patient/family informed of their freedom to choose among providers that offer the needed level of care, that participate in Medicare, Medicaid or managed care program needed by the patient, have an available bed and are willing to accept the patient.  07/27/2013  Patient/family informed of [facility]' ownership interest in [facility], as well as of the fact that they are under no obligation to receive care at this facility.  PASARR submitted to EDS on  PASARR number received from EDS on   FL2 transmitted to all facilities in geographic area requested by pt/family on  07/27/2013 FL2 transmitted to all facilities within larger geographic area on   Patient informed that his/her managed care company has contracts with or will negotiate with  certain facilities, including the following:     Patient/family informed of bed offers received:   Patient chooses bed at  Physician recommends and patient chooses bed at    Patient to be transferred to  on   Patient to be transferred to facility by   The following physician request were entered in Epic:   Additional Comments: CSW put 30 day note on shadow chart for MD to sign so it can be submitted with the PASARR.

## 2013-07-28 LAB — COMPREHENSIVE METABOLIC PANEL
ALT: 28 U/L (ref 0–53)
AST: 58 U/L — ABNORMAL HIGH (ref 0–37)
Albumin: 1.9 g/dL — ABNORMAL LOW (ref 3.5–5.2)
Alkaline Phosphatase: 94 U/L (ref 39–117)
BUN: 11 mg/dL (ref 6–23)
CO2: 26 mEq/L (ref 19–32)
Calcium: 8.2 mg/dL — ABNORMAL LOW (ref 8.4–10.5)
Chloride: 99 mEq/L (ref 96–112)
Creatinine, Ser: 0.74 mg/dL (ref 0.50–1.35)
GFR calc Af Amer: >90 mL/min (ref >90)
GFR calc non Af Amer: >90 mL/min (ref >90)
Glucose, Bld: 107 mg/dL — ABNORMAL HIGH (ref 70–99)
Potassium: 4.2 mEq/L (ref 3.5–5.1)
Sodium: 129 mEq/L — ABNORMAL LOW (ref 135–145)
Total Bilirubin: 2.1 mg/dL — ABNORMAL HIGH (ref 0.3–1.2)
Total Protein: 4.3 g/dL — ABNORMAL LOW (ref 6.0–8.3)

## 2013-07-28 MED ORDER — ASPIRIN EC 325 MG PO TBEC: 325.0000 mg | DELAYED_RELEASE_TABLET | Freq: Two times a day (BID) | ORAL | Status: DC

## 2013-07-28 MED ORDER — OXYCODONE HCL 5 MG PO TABS: 5.0000 mg | ORAL_TABLET | ORAL | Status: DC | PRN

## 2013-07-28 MED ORDER — PROMETHAZINE HCL 25 MG PO TABS: 25.0000 mg | ORAL_TABLET | Freq: Four times a day (QID) | ORAL | Status: DC | PRN

## 2013-07-28 MED ORDER — MENTHOL 3 MG MT LOZG: 1.0000 | LOZENGE | OROMUCOSAL | Status: DC | PRN

## 2013-07-28 MED ORDER — PHENOL 1.4 % MT LIQD
1.0000 | OROMUCOSAL | Status: DC | PRN
  Filled 2013-07-28: qty 177
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
## 2013-07-28 NOTE — Progress Notes (Signed)
Subjective:  Patient reports pain as moderate.  Reports pain is improved.  Objective:   VITALS:   Filed Vitals:   07/27/13 1900 07/27/13 2000 07/27/13 2107 07/28/13 0523  BP: 124/55  130/97 102/46  Pulse: 74  74 73  Temp: 99.1 F (37.3 C)  98.3 F (36.8 C) 98.3 F (36.8 C)  TempSrc: Oral  Oral Oral  Resp: 18 18 18 18   Height:      Weight:      SpO2: 96% 95% 95% 96%    Neurologically intact Neurovascular intact Sensation intact distally Intact pulses distally Dorsiflexion/Plantar flexion intact Incision: dressing C/D/I and no drainage No cellulitis present Compartment soft   Lab Results  Component Value Date   WBC 7.5 07/26/2013   HGB 10.4* 07/26/2013   HCT 29.9* 07/26/2013   MCV 98.0 07/26/2013   PLT 45* 07/26/2013     Assessment/Plan: 3 Days Post-Op   Problem List Items Addressed This Visit     Digestive   Chronic liver disease and cirrhosis     Musculoskeletal and Integument   *Tibial plateau fracture, tibial shaft fracture - Primary   Relevant Orders      Non weight bearing     Hematopoietic and Hemostatic   Thrombocytopenia     Other   Seizures   Relevant Medications      levETIRAcetam (KEPPRA) 1000 MG tablet      levETIRAcetam (KEPPRA) tablet 1,000 mg      Advance diet Up with therapy DVT ppx - SCDs, ambulation, asa NWB RLE Bledsoe at all times Pain control Rehab consult pending   Cheral Almas 07/28/2013, 8:10 AM [phone]

## 2013-07-28 NOTE — Progress Notes (Signed)
Physical Therapy Treatment Patient Details Name: [name] MRN: [id number] DOB: 1966/02/07 Today's Date: 07/28/2013 Time: 4098-1191 PT Time Calculation (min): 29 min  PT Assessment / Plan / Recommendation  History of Present Illness Closed right lateral tibial plateau fracture with oblique proximal third tibial shaft fracture and minimally displaced distal fibular shaft fracture..  Underwent ORIF 11/21.   PT Comments   Pt admitted with above. Pt currently with functional limitations due to balance and endurance deficits. Pt will benefit from skilled PT to increase their independence and safety with mobility to allow discharge to the venue listed below.   Follow Up Recommendations  CIR                 Equipment Recommendations  Rolling walker with 5" wheels;3in1 (PT)        Frequency Min 6X/week   Progress towards PT Goals Progress towards PT goals: Progressing toward goals  Plan Current plan remains appropriate    Precautions / Restrictions Precautions Precautions: Fall Required Braces or Orthoses: Other Brace/Splint (Bledsoe brace) Knee Immobilizer - Right: On at all times Restrictions Weight Bearing Restrictions: Yes RLE Weight Bearing: Non weight bearing   Pertinent Vitals/Pain VSS, No pain per pt    Mobility  Bed Mobility Bed Mobility: Supine to Sit;Sit to Supine;Scooting to HOB Supine to Sit: 4: Min guard;With rails Details for Bed Mobility Assistance: Pt hooked foot to help assist RLE off bed.  Transfers Transfers: Sit to Stand;Stand to Sit Sit to Stand: 4: Min assist;With upper extremity assist;From bed;From chair/3-in-1 Stand to Sit: 4: Min assist;With upper extremity assist;To bed;To chair/3-in-1 Details for Transfer Assistance: Assisted in holding RLE when xcoming up from and going to sitting position.  Ambulation/Gait Ambulation/Gait Assistance: 4: Min assist Ambulation Distance (Feet): 48 Feet Assistive device: Rolling walker Ambulation/Gait  Assistance Details: Maintains NWB.  Cues for walker position as well as gait.  Still with shakiness especially as pt goes further and fatigues.  Still with anxiety as well which worsens as he fatigues as well   Gait Pattern: Step-to pattern Gait velocity: decreased Stairs: No Wheelchair Mobility Wheelchair Mobility: No    Exercises General Exercises - Lower Extremity Quad Sets: AROM;Strengthening;Right;10 reps;Seated Hip ABduction/ADduction: AAROM;Strengthening;Right;10 reps;Seated   PT Goals (current goals can now be found in the care plan section)    Visit Information  Last PT Received On: 07/28/13 Assistance Needed: +1 History of Present Illness: Closed right lateral tibial plateau fracture with oblique proximal third tibial shaft fracture and minimally displaced distal fibular shaft fracture..  Underwent ORIF 11/21.    Subjective Data  Subjective: "I feel so much better."   Cognition  Cognition Arousal/Alertness: Awake/alert Behavior During Therapy: WFL for tasks assessed/performed Overall Cognitive Status: Within Functional Limits for tasks assessed    Balance  Static Standing Balance Static Standing - Balance Support: Bilateral upper extremity supported;During functional activity Static Standing - Level of Assistance: 4: Min assist Static Standing - Comment/# of Minutes: 3  End of Session PT - End of Session Equipment Utilized During Treatment: Gait belt Activity Tolerance: Patient tolerated treatment well Patient left: in chair;with call bell/phone within reach Nurse Communication: Mobility status        INGOLD,Mattie Nordell 07/28/2013, 10:14 AM [facility] [phone] (pager)

## 2013-07-29 ENCOUNTER — Encounter (HOSPITAL_COMMUNITY): Payer: Self-pay | Admitting: Orthopaedic Surgery

## 2013-07-29 DIAGNOSIS — S82409A Unspecified fracture of shaft of unspecified fibula, initial encounter for closed fracture: Secondary | ICD-10-CM

## 2013-07-29 DIAGNOSIS — S82839A Other fracture of upper and lower end of unspecified fibula, initial encounter for closed fracture: Secondary | ICD-10-CM

## 2013-07-29 DIAGNOSIS — S82109A Unspecified fracture of upper end of unspecified tibia, initial encounter for closed fracture: Secondary | ICD-10-CM

## 2013-07-29 DIAGNOSIS — N390 Urinary tract infection, site not specified: Secondary | ICD-10-CM | POA: Diagnosis present

## 2013-07-29 LAB — URINE MICROSCOPIC-ADD ON

## 2013-07-29 LAB — URINALYSIS, ROUTINE W REFLEX MICROSCOPIC
Glucose, UA: NEGATIVE mg/dL
Hgb urine dipstick: NEGATIVE
Ketones, ur: NEGATIVE mg/dL
Nitrite: NEGATIVE
Protein, ur: NEGATIVE mg/dL
Specific Gravity, Urine: 1.028 (ref 1.005–1.030)
Urobilinogen, UA: 2 mg/dL — ABNORMAL HIGH (ref 0.0–1.0)
pH: 6 (ref 5.0–8.0)

## 2013-07-29 MED ORDER — FUROSEMIDE 20 MG PO TABS
60.0000 mg | ORAL_TABLET | Freq: Two times a day (BID) | ORAL | Status: DC
  Administered 2013-07-29: 40 mg via ORAL
  Administered 2013-07-30 – 2013-08-04 (×11): 60 mg via ORAL
  Filled 2013-07-29 (×15): qty 1

## 2013-07-29 MED ORDER — PHENAZOPYRIDINE HCL 100 MG PO TABS
100.0000 mg | ORAL_TABLET | Freq: Three times a day (TID) | ORAL | Status: AC
  Administered 2013-07-30 – 2013-07-31 (×6): 100 mg via ORAL
  Filled 2013-07-29 (×7): qty 1

## 2013-07-29 MED ORDER — CIPROFLOXACIN HCL 500 MG PO TABS
500.0000 mg | ORAL_TABLET | Freq: Two times a day (BID) | ORAL | Status: DC
  Administered 2013-07-29: 500 mg via ORAL
  Filled 2013-07-29 (×2): qty 1

## 2013-07-29 MED ORDER — DEXTROSE 5 % IV SOLN
1.0000 g | INTRAVENOUS | Status: DC
  Administered 2013-07-29 – 2013-08-01 (×4): 1 g via INTRAVENOUS
  Filled 2013-07-29 (×5): qty 10

## 2013-07-29 NOTE — Consult Note (Signed)
Medical Consultation   [name]  ZOX:[id number]  DOB: 1966-08-06  DOA: 07/24/2013  PCP: Oliver Barre, MD  Requesting physician: Dr Roda Shutters  Reason for consultation: Dysuria  History of Present Illness: Patient is a 47 year old male with history of bipolar disorder, liver disease, Wilson disease, history of seizure disorder currently and admitted under orthopedic service with a right tibial plateau fracture, right fibular fracture. Patient underwent ORIF right tibial shaft, tibial plateau and fibula fractures. Medicine service was consulted today for complaints of dysuria, UTI by the patient. The patient reports that he is frustrated with dysuria and bothers him with the physical therapy as well, having lower abdominal cramping but no flank pain or CP angle tenderness. Denies any fevers or chills or any hematuria. He did have history of UTIs x3 in the past and has been usually treated for 5 days to week with antibiotics.   Allergies:   Allergies  Allergen Reactions  . Codeine Nausea And Vomiting      Past Medical History  Diagnosis Date  . Hernia   . Bipolar affective disorder     [facility]  . Depression   . Seizures     Dr Anne Hahn  . Abuse, drug or alcohol   . Wilson disease   . Encephalopathy   . Thrombocytopenia   . Chronic liver disease and cirrhosis   . History of alcohol abuse     Past Surgical History  Procedure Laterality Date  . Left inguinal hernia  june 2012    [facility]  . Vascular surgery    . Open reduction internal fixation (orif) tibia/fibula fracture Right 07/25/2013    Procedure: OPEN REDUCTION INTERNAL FIXATION (ORIF) RIGHT TIBIAL PLATEAU, TIBIAL SHAFT AND FIBULA FRACTURES, POSSIBLE FASCIOTOMIES;  Surgeon: Cheral Almas, MD;  Location: [facility] OR;  Service: Orthopedics;  Laterality: Right;    Social History:  reports that he has been smoking.  He does not have any smokeless tobacco history on file. He reports that he drinks  alcohol. He reports that he does not use illicit drugs.  Family History  Problem Relation Age of Onset  . Heart disease Other     Review of Systems:  Review of Systems:  Constitutional: Denies fever, chills, diaphoresis, appetite change and fatigue.  HEENT: Denies photophobia, eye pain, redness, hearing loss, ear pain, congestion, sore throat, rhinorrhea, sneezing, mouth sores, trouble swallowing, neck pain, neck stiffness and tinnitus.   Respiratory: Denies SOB, DOE, cough, chest tightness,  and wheezing.   Cardiovascular: Denies chest pain, palpitations and leg swelling.  Gastrointestinal: Denies nausea, vomiting, abdominal pain, diarrhea, constipation, blood in stool and abdominal distention.  Genitourinary: Please see history of present illness  Musculoskeletal: Denies myalgias, back pain, joint swelling, arthralgias and gait problem.  Skin: Denies pallor, rash and wound.  Neurological: Denies dizziness, seizures, syncope, weakness, light-headedness, numbness and headaches.  Hematological: Denies adenopathy. Easy bruising, personal or family bleeding history  Psychiatric/Behavioral: Denies suicidal ideation, mood changes, confusion, nervousness, sleep disturbance and agitation   Physical Exam: Blood pressure 109/50, pulse 70, temperature 97.8 F (36.6 C), temperature source Oral, resp. rate 18, height 6' (1.829 m), weight 79.379 kg (175 lb), SpO2 98.00%.  General: Alert and awake, oriented x3, not in any acute distress. HEENT: normocephalic, atraumatic, anicteric sclera, pupils reactive to light and accommodation, EOMI, oropharynx clear CVS: S1-S2 clear, no murmur rubs or gallops Chest: clear to auscultation bilaterally, no wheezing, rales or rhonchi Abdomen: soft nontender, nondistended, normal bowel sounds, no  organomegaly, no CVAT Extremities: no cyanosis, clubbing or edema noted bilaterally Neuro: Cranial nerves II-XII intact, no focal neurological deficits Psych: alert and  oriented, stable mood and affect Skin: no rashes or lesions  Labs on Admission:  Basic Metabolic Panel:  Recent Labs Lab 07/27/13 0410 07/28/13 0300  NA 130* 129*  K 4.2 4.2  CL 100 99  CO2 26 26  GLUCOSE 97 107*  BUN 11 11  CREATININE 0.69 0.74  CALCIUM 8.4 8.2*   Liver Function Tests:  Recent Labs Lab 07/27/13 0410 07/28/13 0300  AST 64* 58*  ALT 29 28  ALKPHOS 94 94  BILITOT 2.5* 2.1*  PROT 4.6* 4.3*  ALBUMIN 2.1* 1.9*   No results found for this basename: LIPASE, AMYLASE,  in the last 168 hours No results found for this basename: AMMONIA,  in the last 168 hours CBC:  Recent Labs Lab 07/24/13 2118 07/26/13 1135  WBC 6.7 7.5  NEUTROABS  --  4.6  HGB 13.6 10.4*  HCT 39.1 29.9*  MCV 98.2 98.0  PLT 58* 45*   Cardiac Enzymes: No results found for this basename: CKTOTAL, CKMB, CKMBINDEX, TROPONINI,  in the last 168 hours BNP: No components found with this basename: POCBNP,  CBG: No results found for this basename: GLUCAP,  in the last 168 hours  Inpatient Medications:   Scheduled Meds: . aspirin EC  325 mg Oral BID  . cefTRIAXone (ROCEPHIN)  IV  1 g Intravenous Q24H  . furosemide  60 mg Oral BID  . levETIRAcetam  1,000 mg Oral BID  . [START ON 07/30/2013] phenazopyridine  100 mg Oral TID WC  . potassium chloride  10 mEq Oral BID  . propranolol  10 mg Oral BID  . rifaximin  550 mg Oral BID  . senna  1 tablet Oral BID  . spironolactone  150 mg Oral Daily  . zinc sulfate  220 mg Oral TID   Continuous Infusions: . sodium chloride 125 mL/hr at 07/26/13 0436  . lactated ringers 20 mL/hr at 07/25/13 1830     Radiological Exams on Admission: No results found.  Impression/Recommendations Principal Problem:   Tibial plateau fracture, tibial shaft fracture  - Per orthopedic service  Active Problems:    Seizures - Continue Keppra     Wilson disease And  Chronic liver disease and cirrhosis - Continue Lasix, Aldactone, rifaximin, Inderal  -   monitor sodium closely, may need to decrease Lasix if sodium starts to trend down further     UTI (urinary tract infection) - Obtain urine culture and sensitivities, I started the patient on IV Rocephin. He does not have any clear CV angle tenderness on examination, no fevers or leukocytosis , my suspicion for pyelonephritis is low - Follow urine cultures, and adjust antibiotics accordingly, will treat for 7 days. - Started on Pyridium for symptomatic treatment for dysuria    Thank you for this consultation. [facility] hospitalist service will follow patient   Time Spent on Admission: 45 mins  Ashlley Booher M.D. [facility] 07/29/2013, 6:39 PM

## 2013-07-29 NOTE — Progress Notes (Signed)
Patient began c/o burning when he voided. An U/A was sent to lab. Patient also stated that he was experiencing increased fullness around his liver and he wanted his lasix re-ordered. Dr. Roda Shutters was made aware and new orders received. Will continue to monitor.

## 2013-07-29 NOTE — Progress Notes (Signed)
Physical Therapy Treatment Patient Details Name: [name] MRN: [id number] DOB: 1965/09/30 Today's Date: 07/29/2013 Time: 4098-1191 PT Time Calculation (min): 16 min  PT Assessment / Plan / Recommendation  History of Present Illness Closed right lateral tibial plateau fracture with oblique proximal third tibial shaft fracture and minimally displaced distal fibular shaft fracture..  Underwent ORIF 11/21.   PT Comments   Pt very pleasant & willing to participate in therapy.  Does well maintaining NWBing with activity.  Required decreased assist for mobility today.     Follow Up Recommendations  CIR     Does the patient have the potential to tolerate intense rehabilitation     Barriers to Discharge        Equipment Recommendations  Rolling walker with 5" wheels;3in1 (PT)    Recommendations for Other Services    Frequency Min 6X/week   Progress towards PT Goals Progress towards PT goals: Progressing toward goals  Plan Current plan remains appropriate    Precautions / Restrictions Precautions Precautions: Fall Required Braces or Orthoses: Other Brace/Splint (hinged brace) Knee Immobilizer - Right: On at all times Restrictions Weight Bearing Restrictions: Yes RLE Weight Bearing: Non weight bearing   Pertinent Vitals/Pain 4/10 Rt LE.  Premedicated.  Repositioned for comfort.      Mobility  Bed Mobility Bed Mobility: Supine to Sit;Sitting - Scoot to Edge of Bed Supine to Sit: 6: Modified independent (Device/Increase time) Sitting - Scoot to Edge of Bed: 6: Modified independent (Device/Increase time) Transfers Transfers: Sit to Stand;Stand to Sit Sit to Stand: 4: Min guard;With upper extremity assist;From bed Stand to Sit: 4: Min assist;With upper extremity assist;With armrests;To chair/3-in-1 Details for Transfer Assistance: Pt demonstrated safe hand placement.  (A) required for RLE management with stand>sit Ambulation/Gait Ambulation/Gait Assistance: 4: Min  guard Ambulation Distance (Feet): 60 Feet Assistive device: Rolling walker Ambulation/Gait Assistance Details: Maintains 100% NWBing.  Slightly unsteady with inital steps but improved as distance increased.  C/o UE fatigue but overall did well.   Gait Pattern: Step-to pattern Gait velocity: decreased Stairs: No Wheelchair Mobility Wheelchair Mobility: No      PT Goals (current goals can now be found in the care plan section) Acute Rehab PT Goals PT Goal Formulation: With patient Time For Goal Achievement: 08/02/13 Potential to Achieve Goals: Good  Visit Information  Last PT Received On: 07/29/13 Assistance Needed: +1 History of Present Illness: Closed right lateral tibial plateau fracture with oblique proximal third tibial shaft fracture and minimally displaced distal fibular shaft fracture..  Underwent ORIF 11/21.    Subjective Data      Cognition  Cognition Arousal/Alertness: Awake/alert Behavior During Therapy: WFL for tasks assessed/performed Overall Cognitive Status: Within Functional Limits for tasks assessed    Balance     End of Session PT - End of Session Activity Tolerance: Patient tolerated treatment well Patient left: in chair;with call bell/phone within reach Nurse Communication: Mobility status   GP     Lara Mulch 07/29/2013, 8:54 AM   Verdell Face, PTA [phone] 07/29/2013

## 2013-07-29 NOTE — Progress Notes (Signed)
Patient is functionally too high level for inpt rehab, minguard 60 feet with RW today. If he does not have 24/7 assist at home, recommend SNF. [phone]

## 2013-07-29 NOTE — Treatment Plan (Signed)
UA c/w UTI Will start cipro 500 BID  N. Glee Arvin, MD [facility] [phone] 4:22 PM

## 2013-07-29 NOTE — Progress Notes (Signed)
Covering Clinical Child psychotherapist (CSW) following for SNF as a back-up. Pt pasarr is pending. 30day note placed in shadow chart on 11/23 and needs to be signed by MD. Currently pt does not have any confirmed SNF bed offers.  Theresia Bough, MSW, LCSW [phone]

## 2013-07-30 NOTE — Social Work (Signed)
30 day note signed by MD and faxed to [facility] for [facility]- awaiting [facility] # for SNF transfer- patient has rec'd only 1 SNF bed offer as of now at [facility].    Reece Levy, MSW, Theresia Majors [phone]

## 2013-07-30 NOTE — Progress Notes (Signed)
[name],PT Acute Rehabilitation [phone] (pager)  

## 2013-07-30 NOTE — Progress Notes (Addendum)
Physical Therapy Treatment Patient Details Name: [name] MRN: [id number] DOB: [birth year] Today's Date: 07/30/2013 Time: 4098-1191 PT Time Calculation (min): 15 min  PT Assessment / Plan / Recommendation  History of Present Illness Closed right lateral tibial plateau fracture with oblique proximal third tibial shaft fracture and minimally displaced distal fibular shaft fracture..  Underwent ORIF 11/21.   PT Comments   Pt states he did not get much rest last night & is tired today as well as c/o of nausea but agreeable to participate in therapy.  Pt ambulated to bathroom & then to door of room; distance limited due to pt c/o feeling weak/tired & nausea.  Per chart review, pt too high level for CIR.  He states he does not have adequate (A)/(S) at home therefore he will need SNF at d/c.  Pt agreeable.  D/c plans updated.     Follow Up Recommendations  SNF     Does the patient have the potential to tolerate intense rehabilitation     Barriers to Discharge        Equipment Recommendations  Rolling walker with 5" wheels;3in1 (PT)    Recommendations for Other Services    Frequency Min 6X/week   Progress towards PT Goals Progress towards PT goals: Progressing toward goals  Plan Discharge plan needs to be updated    Precautions / Restrictions Precautions Precautions: Fall Required Braces or Orthoses: Other Brace/Splint (bledsoe brace) Knee Immobilizer - Right: On at all times Restrictions Weight Bearing Restrictions: Yes RLE Weight Bearing: Non weight bearing       Mobility  Bed Mobility Bed Mobility: Supine to Sit;Sitting - Scoot to Edge of Bed;Sit to Supine Supine to Sit: 4: Min assist;HOB flat Sitting - Scoot to Edge of Bed: 6: Modified independent (Device/Increase time) Sit to Supine: 6: Modified independent (Device/Increase time);HOB flat Details for Bed Mobility Assistance: Min (A) to lift RLE off pillows today.   Transfers Transfers: Sit to Stand;Stand to Sit Sit to  Stand: 4: Min guard;With upper extremity assist;From bed Stand to Sit: 5: Supervision;With upper extremity assist;To chair/3-in-1 Details for Transfer Assistance: Pt slightly shakey with initial standing but no physical (A) needed.   Ambulation/Gait Ambulation/Gait Assistance: 4: Min guard Ambulation Distance (Feet): 30 Feet Assistive device: Rolling walker Ambulation/Gait Assistance Details: Distance limited due to fatigue & nausea.  Pt a little more unsteady but did not require physical (A).   Gait Pattern: Step-to pattern Gait velocity: decreased Stairs: No Wheelchair Mobility Wheelchair Mobility: No    Exercises General Exercises - Lower Extremity Ankle Circles/Pumps: AROM;Both;10 reps Quad Sets: AROM;Strengthening;Both;10 reps Straight Leg Raises: AAROM;Strengthening;Right;5 reps    PT Goals (current goals can now be found in the care plan section) Acute Rehab PT Goals PT Goal Formulation: With patient Time For Goal Achievement: 08/02/13 Potential to Achieve Goals: Good  Visit Information  Last PT Received On: 07/30/13 Assistance Needed: +1 History of Present Illness: Closed right lateral tibial plateau fracture with oblique proximal third tibial shaft fracture and minimally displaced distal fibular shaft fracture..  Underwent ORIF 11/21.    Subjective Data      Cognition  Cognition Arousal/Alertness: Awake/alert Behavior During Therapy: WFL for tasks assessed/performed Overall Cognitive Status: Within Functional Limits for tasks assessed    Balance  Static Standing Balance Static Standing - Balance Support: Right upper extremity supported;During functional activity Static Standing - Level of Assistance: 5: Stand by assistance Static Standing - Comment/# of Minutes: ~3 mins + 1 min (urinating at toilet + handwashing)  End of Session PT - End of Session Activity Tolerance: Patient tolerated treatment well Patient left: with call bell/phone within reach;in bed Nurse  Communication: Mobility status   GP     Lara Mulch 07/30/2013, 11:05 AM   Verdell Face, PTA [phone] 07/30/2013

## 2013-07-30 NOTE — Progress Notes (Signed)
[facility] PROGRESS NOTE  [name] ZOX:[id number] DOB: 1966-05-04 DOA: 07/24/2013 PCP: Oliver Barre, MD  Assessment/Plan:  Seizures  - Continue Keppra  Wilson disease And Chronic liver disease and cirrhosis  - Continue Lasix, Aldactone, rifaximin, Inderal  - monitor sodium closely, may need to decrease Lasix if sodium starts to trend down further  UTI (urinary tract infection)  - she was started on rocephin and she can be discharged on keflex. Follow urine cultures and narrow the antibiotics.  - Started on Pyridium for symptomatic treatment for dysuria     Antibiotics:  rocephin  HPI/Subjective:   Objective: Filed Vitals:   07/30/13 1200  BP:   Pulse:   Temp:   Resp: 18    Intake/Output Summary (Last 24 hours) at 07/30/13 1459 Last data filed at 07/30/13 0900  Gross per 24 hour  Intake    810 ml  Output    900 ml  Net    -90 ml   Filed Weights   07/24/13 1631  Weight: 79.379 kg (175 lb)    Exam:   General:  Alert afebrile comfortable  Cardiovascular: s1s2  Respiratory: CTAB  Abdomen: soft NT ND BS+  Musculoskeletal: no pedal edema.   Data Reviewed: Basic Metabolic Panel:  Recent Labs Lab 07/24/13 2118 07/26/13 0520 07/27/13 0410 07/28/13 0300  NA 134* 131* 130* 129*  K 4.3 4.7 4.2 4.2  CL 104 102 100 99  CO2 23 25 26 26   GLUCOSE 153* 105* 97 107*  BUN 13 12 11 11   CREATININE 0.76 0.72 0.69 0.74  CALCIUM 9.3 8.7 8.4 8.2*   Liver Function Tests:  Recent Labs Lab 07/24/13 2118 07/26/13 0520 07/27/13 0410 07/28/13 0300  AST 43* 47* 64* 58*  ALT 40 31 29 28   ALKPHOS 118* 95 94 94  BILITOT 2.7* 3.2* 2.5* 2.1*  PROT 6.0 4.8* 4.6* 4.3*  ALBUMIN 2.8* 2.2* 2.1* 1.9*   No results found for this basename: LIPASE, AMYLASE,  in the last 168 hours No results found for this basename: AMMONIA,  in the last 168 hours CBC:  Recent Labs Lab 07/24/13 2118 07/26/13 1135  WBC 6.7 7.5  NEUTROABS  --  4.6  HGB 13.6 10.4*  HCT 39.1  29.9*  MCV 98.2 98.0  PLT 58* 45*   Cardiac Enzymes: No results found for this basename: CKTOTAL, CKMB, CKMBINDEX, TROPONINI,  in the last 168 hours BNP (last 3 results) No results found for this basename: PROBNP,  in the last 8760 hours CBG: No results found for this basename: GLUCAP,  in the last 168 hours  Recent Results (from the past 240 hour(s))  SURGICAL PCR SCREEN     Status: None   Collection Time    07/25/13 10:22 AM      Result Value Range Status   MRSA, PCR NEGATIVE  NEGATIVE Final   Staphylococcus aureus NEGATIVE  NEGATIVE Final   Comment:            The Xpert SA Assay (FDA     approved for NASAL specimens     in patients over 69 years of age),     is one component of     a comprehensive surveillance     program.  Test performance has     been validated by [employer] for patients greater     than or equal to 65 year old.     It is not intended     to diagnose  infection nor to     guide or monitor treatment.     Studies: No results found.  Scheduled Meds: . aspirin EC  325 mg Oral BID  . cefTRIAXone (ROCEPHIN)  IV  1 g Intravenous Q24H  . furosemide  60 mg Oral BID  . levETIRAcetam  1,000 mg Oral BID  . phenazopyridine  100 mg Oral TID WC  . potassium chloride  10 mEq Oral BID  . propranolol  10 mg Oral BID  . rifaximin  550 mg Oral BID  . senna  1 tablet Oral BID  . spironolactone  150 mg Oral Daily  . zinc sulfate  220 mg Oral TID   Continuous Infusions: . sodium chloride 125 mL/hr at 07/26/13 0436  . lactated ringers 20 mL/hr at 07/25/13 1830    Principal Problem:   Tibial plateau fracture, tibial shaft fracture Active Problems:   Bipolar affective disorder   Seizures   Wilson disease   Chronic liver disease and cirrhosis   UTI (urinary tract infection)    Time spent: 15 min    [name]  [facility] Pager [phone]. If 7PM-7AM, please contact night-coverage at [email or web address], password [facility] 07/30/2013, 2:59 PM  LOS: 6  days

## 2013-07-30 NOTE — Progress Notes (Signed)
Occupational Therapy Treatment Patient Details Name: [name] MRN: [id number] DOB: 03/04/1966 Today's Date: 07/30/2013 Time: 4098-1191 OT Time Calculation (min): 20 min  OT Assessment / Plan / Recommendation  History of present illness Closed right lateral tibial plateau fracture with oblique proximal third tibial shaft fracture and minimally displaced distal fibular shaft fracture..  Underwent ORIF 11/21.   OT comments  This 47 yo making progress, will continue to benefit from follow up OT now at SNF level.  Follow Up Recommendations  SNF       Equipment Recommendations  3 in 1 bedside comode       Frequency Min 2X/week   Progress towards OT Goals Progress towards OT goals: Progressing toward goals  Plan Discharge plan needs to be updated    Precautions / Restrictions Precautions Precautions: Fall Required Braces or Orthoses: Other Brace/Splint Knee Immobilizer - Right: On at all times Other Brace/Splint: bledsoe brace RLE Restrictions Weight Bearing Restrictions: Yes RLE Weight Bearing: Non weight bearing   Pertinent Vitals/Pain 3/10 RLE    ADL  Lower Body Dressing: Set up;Supervision/safety (with AE) Where Assessed - Lower Body Dressing: Unsupported sitting      OT Goals(current goals can now be found in the care plan section)    Visit Information  Last OT Received On: 07/30/13 Assistance Needed: +1 History of Present Illness: Closed right lateral tibial plateau fracture with oblique proximal third tibial shaft fracture and minimally displaced distal fibular shaft fracture..  Underwent ORIF 11/21.          Cognition  Cognition Arousal/Alertness: Awake/alert Behavior During Therapy: WFL for tasks assessed/performed Overall Cognitive Status: Within Functional Limits for tasks assessed    Mobility  Bed Mobility Bed Mobility: Supine to Sit;Sitting - Scoot to [place] of Bed;Sit to Supine;Scooting to [facility] Supine to Sit: 6: Modified independent  (Device/Increase time);With rails;HOB elevated Sitting - Scoot to Edge of Bed: 6: Modified independent (Device/Increase time) Sit to Supine: 4: Min assist;With rail;HOB elevated (RLE only) Scooting to [facility]: 6: Modified independent (Device/Increase time);With trapeze          End of Session OT - End of Session Equipment Utilized During Treatment:  (AE) Activity Tolerance: Patient tolerated treatment well Patient left: in bed;with call bell/phone within reach       Evette Georges [phone] 07/30/2013, 2:38 PM

## 2013-07-31 DIAGNOSIS — R569 Unspecified convulsions: Secondary | ICD-10-CM

## 2013-07-31 DIAGNOSIS — K769 Liver disease, unspecified: Secondary | ICD-10-CM

## 2013-07-31 LAB — BASIC METABOLIC PANEL
BUN: 17 mg/dL (ref 6–23)
CO2: 22 mEq/L (ref 19–32)
Calcium: 8.3 mg/dL — ABNORMAL LOW (ref 8.4–10.5)
Chloride: 104 mEq/L (ref 96–112)
Creatinine, Ser: 0.79 mg/dL (ref 0.50–1.35)
GFR calc Af Amer: >90 mL/min (ref >90)
GFR calc non Af Amer: >90 mL/min (ref >90)
Glucose, Bld: 98 mg/dL (ref 70–99)
Potassium: 4 mEq/L (ref 3.5–5.1)
Sodium: 133 mEq/L — ABNORMAL LOW (ref 135–145)

## 2013-07-31 NOTE — Progress Notes (Signed)
Pt's current IV access lost d/t infiltration. Pt currently receiving IV antibiotics. Pt very agitated and upset. Expressed frustration with previous nurses' attempts at establishing IV access. Pt also very upset with lab techs; stated that they "blew his vein" at last lab draw. Pt refused unit nurses' attempt at a restart. Pt has had multiple prior hospitalizations and IVs. Pt requested IV team to reestablish access. Will contact IV team. [name], RN 07/31/2013 9:16 PM

## 2013-07-31 NOTE — Progress Notes (Signed)
[facility] PROGRESS NOTE  [name] YQM:[id number] DOB: 1966-05-22 DOA: 07/24/2013 PCP: Oliver Barre, MD  Assessment/Plan:  Seizures  - Continue Keppra  Wilson disease And Chronic liver disease and cirrhosis  - Continue Lasix, Aldactone, rifaximin, Inderal  - monitor sodium closely, may need to decrease Lasix if sodium starts to trend down further  UTI (urinary tract infection)  - he was started on rocephin and she can be discharged on keflex. Follow urine cultures and narrow the antibiotics.  - Started on Pyridium for symptomatic treatment for dysuria     Antibiotics:  rocephin  HPI/Subjective:  burning urination resolved.  Objective: Filed Vitals:   07/31/13 1140  BP:   Pulse:   Temp:   Resp: 18    Intake/Output Summary (Last 24 hours) at 07/31/13 1159 Last data filed at 07/31/13 1049  Gross per 24 hour  Intake    630 ml  Output   3425 ml  Net  -2795 ml   Filed Weights   07/24/13 1631  Weight: 79.379 kg (175 lb)    Exam:   General:  Alert afebrile comfortable  Cardiovascular: s1s2  Respiratory: CTAB  Abdomen: soft NT ND BS+  Musculoskeletal: no pedal edema.   Data Reviewed: Basic Metabolic Panel:  Recent Labs Lab 07/24/13 2118 07/26/13 0520 07/27/13 0410 07/28/13 0300 07/31/13 0335  NA 134* 131* 130* 129* 133*  K 4.3 4.7 4.2 4.2 4.0  CL 104 102 100 99 104  CO2 23 25 26 26 22   GLUCOSE 153* 105* 97 107* 98  BUN 13 12 11 11 17   CREATININE 0.76 0.72 0.69 0.74 0.79  CALCIUM 9.3 8.7 8.4 8.2* 8.3*   Liver Function Tests:  Recent Labs Lab 07/24/13 2118 07/26/13 0520 07/27/13 0410 07/28/13 0300  AST 43* 47* 64* 58*  ALT 40 31 29 28   ALKPHOS 118* 95 94 94  BILITOT 2.7* 3.2* 2.5* 2.1*  PROT 6.0 4.8* 4.6* 4.3*  ALBUMIN 2.8* 2.2* 2.1* 1.9*   No results found for this basename: LIPASE, AMYLASE,  in the last 168 hours No results found for this basename: AMMONIA,  in the last 168 hours CBC:  Recent Labs Lab 07/24/13 2118  07/26/13 1135  WBC 6.7 7.5  NEUTROABS  --  4.6  HGB 13.6 10.4*  HCT 39.1 29.9*  MCV 98.2 98.0  PLT 58* 45*   Cardiac Enzymes: No results found for this basename: CKTOTAL, CKMB, CKMBINDEX, TROPONINI,  in the last 168 hours BNP (last 3 results) No results found for this basename: PROBNP,  in the last 8760 hours CBG: No results found for this basename: GLUCAP,  in the last 168 hours  Recent Results (from the past 240 hour(s))  SURGICAL PCR SCREEN     Status: None   Collection Time    07/25/13 10:22 AM      Result Value Range Status   MRSA, PCR NEGATIVE  NEGATIVE Final   Staphylococcus aureus NEGATIVE  NEGATIVE Final   Comment:            The Xpert SA Assay (FDA     approved for NASAL specimens     in patients over 32 years of age),     is one component of     a comprehensive surveillance     program.  Test performance has     been validated by [employer] for patients greater     than or equal to 26 year old.  It is not intended     to diagnose infection nor to     guide or monitor treatment.     Studies: No results found.  Scheduled Meds: . aspirin EC  325 mg Oral BID  . cefTRIAXone (ROCEPHIN)  IV  1 g Intravenous Q24H  . furosemide  60 mg Oral BID  . levETIRAcetam  1,000 mg Oral BID  . phenazopyridine  100 mg Oral TID WC  . potassium chloride  10 mEq Oral BID  . propranolol  10 mg Oral BID  . rifaximin  550 mg Oral BID  . senna  1 tablet Oral BID  . spironolactone  150 mg Oral Daily  . zinc sulfate  220 mg Oral TID   Continuous Infusions: . sodium chloride 125 mL/hr at 07/26/13 0436  . lactated ringers 20 mL/hr at 07/25/13 1830    Principal Problem:   Tibial plateau fracture, tibial shaft fracture Active Problems:   Bipolar affective disorder   Seizures   Wilson disease   Chronic liver disease and cirrhosis   UTI (urinary tract infection)    Time spent: 15 min    [name]  [facility] Pager [phone]. If 7PM-7AM, please  contact night-coverage at [email or web address], password [facility] 07/31/2013, 11:59 AM  LOS: 7 days

## 2013-08-01 NOTE — Progress Notes (Signed)
Occupational Therapy Treatment Patient Details Name: Thai Burgueno MRN: [id number] DOB: 04-18-1966 Today's Date: 08/01/2013 Time: 4098-1191 OT Time Calculation (min): 24 min  OT Assessment / Plan / Recommendation  History of present illness Closed right lateral tibial plateau fracture with oblique proximal third tibial shaft fracture and minimally displaced distal fibular shaft fracture..  Underwent ORIF 11/21.   OT comments  Pt is making progress, but is really concerned how he will manage some BADLs and all IADLS at home. Pt is at times still unsteady when up on his feet. Will benefit from continued OT at SNF. Pt is going to talk to his landlord about taking down his shower doors and putting up a shower curtain so that when he does go home this will allow him the opportunity to get in the shower with his NWB'ing status.  Follow Up Recommendations  SNF       Equipment Recommendations  3 in 1 bedside comode       Frequency Min 2X/week   Progress towards OT Goals Progress towards OT goals: Progressing toward goals  Plan Discharge plan needs to be updated    Precautions / Restrictions Precautions Precautions: Fall Required Braces or Orthoses: Other Brace/Splint Knee Immobilizer - Right: On at all times Other Brace/Splint: bledsoe brace RLE Restrictions Weight Bearing Restrictions: Yes RLE Weight Bearing: Non weight bearing       ADL  Lower Body Dressing: Set up;Supervision/safety (to doff pants with reacher; decreased problem solving) Toilet Transfer: Hydrographic surveyor Method: Sit to Barista: Raised toilet seat with arms (or 3-in-1 over toilet) Equipment Used: Reacher;Rolling walker (LE brace) Transfers/Ambulation Related to ADLs: Min guard A (at time he is still unsteady when he is up with RW      OT Goals(current goals can now be found in the care plan section)    Visit Information  Last OT Received On: 08/01/13 Assistance Needed:  +1 History of Present Illness: Closed right lateral tibial plateau fracture with oblique proximal third tibial shaft fracture and minimally displaced distal fibular shaft fracture..  Underwent ORIF 11/21.          Cognition  Cognition Arousal/Alertness: Awake/alert Behavior During Therapy: WFL for tasks assessed/performed Overall Cognitive Status: Within Functional Limits for tasks assessed    Mobility  Bed Mobility Bed Mobility: Supine to Sit;Sitting - Scoot to Edge of Bed Supine to Sit: 6: Modified independent (Device/Increase time);HOB flat Sitting - Scoot to Edge of Bed: 6: Modified independent (Device/Increase time) Details for Bed Mobility Assistance: Pt uses UE's to manage RLE to EOB/OOB Transfers Transfers: Sit to Stand;Stand to Sit Sit to Stand: 4: Min guard;With upper extremity assist;From chair/3-in-1 Stand to Sit: 4: Min guard;With upper extremity assist;With armrests;To bed Details for Transfer Assistance: Pt demonstrates safe hand placement.            End of Session OT - End of Session Equipment Utilized During Treatment: Rolling walker Activity Tolerance: Patient tolerated treatment well Patient left: in chair;with call bell/phone within reach       Evette Georges [phone] 08/01/2013, 3:50 PM

## 2013-08-01 NOTE — Progress Notes (Signed)
Physical Therapy Treatment Patient Details Name: [name] MRN: [id number] DOB: 11/09/1965 Today's Date: 08/01/2013 Time: 5621-3086 PT Time Calculation (min): 28 min  PT Assessment / Plan / Recommendation  History of Present Illness Closed right lateral tibial plateau fracture with oblique proximal third tibial shaft fracture and minimally displaced distal fibular shaft fracture..  Underwent ORIF 11/21.   PT Comments   Pt increased ambulation distance today.  Cont's to require min guard for overall mobility as he is slightly unsteady with ambulation.  Incr time due to discussing with Pt Re: d/c plans.  Pt states he does have roommates that he lives with but on Dec 1st he will no longer be living with them & he will not have adequate assist/supervision.     Follow Up Recommendations  SNF     Does the patient have the potential to tolerate intense rehabilitation     Barriers to Discharge        Equipment Recommendations  Rolling walker with 5" wheels;3in1 (PT)    Recommendations for Other Services    Frequency Min 6X/week   Progress towards PT Goals Progress towards PT goals: Progressing toward goals  Plan      Precautions / Restrictions Precautions Precautions: Fall Required Braces or Orthoses: Other Brace/Splint Knee Immobilizer - Right: On at all times Other Brace/Splint: bledsoe brace RLE Restrictions RLE Weight Bearing: Non weight bearing       Mobility  Bed Mobility Bed Mobility: Supine to Sit;Sitting - Scoot to Edge of Bed Supine to Sit: 6: Modified independent (Device/Increase time);HOB flat Sitting - Scoot to Edge of Bed: 6: Modified independent (Device/Increase time) Details for Bed Mobility Assistance: Pt uses UE's to manage RLE to EOB/OOB Transfers Transfers: Sit to Stand;Stand to Sit Sit to Stand: 4: Min guard;With upper extremity assist;From bed Stand to Sit: 5: Supervision;With upper extremity assist;To chair/3-in-1 Details for Transfer Assistance:  Pt demonstrates safe hand placement.   Ambulation/Gait Ambulation/Gait Assistance: 4: Min guard Ambulation Distance (Feet): 80 Feet Assistive device: Rolling walker Ambulation/Gait Assistance Details: Pt cont's to be mildly unsteady but does not require physical (A).    Gait Pattern: Step-to pattern Gait velocity: decreased General Gait Details: UE's shakey Stairs: No Wheelchair Mobility Wheelchair Mobility: No      PT Goals (current goals can now be found in the care plan section) Acute Rehab PT Goals PT Goal Formulation: With patient Time For Goal Achievement: 08/02/13 Potential to Achieve Goals: Good  Visit Information  Last PT Received On: 08/01/13 Assistance Needed: +1 History of Present Illness: Closed right lateral tibial plateau fracture with oblique proximal third tibial shaft fracture and minimally displaced distal fibular shaft fracture..  Underwent ORIF 11/21.    Subjective Data      Cognition  Cognition Arousal/Alertness: Awake/alert Behavior During Therapy: WFL for tasks assessed/performed Overall Cognitive Status: Within Functional Limits for tasks assessed    Balance     End of Session PT - End of Session Activity Tolerance: Patient tolerated treatment well Patient left: in chair;with call bell/phone within reach Nurse Communication: Mobility status;Patient requests pain meds   GP     Lara Mulch 08/01/2013, 2:43 PM   Verdell Face, PTA [phone] 08/01/2013

## 2013-08-01 NOTE — Progress Notes (Signed)
[facility] PROGRESS NOTE  [name] ZOX:[id number] DOB: 1965-12-01 DOA: 07/24/2013 PCP: Oliver Barre, MD  Assessment/Plan:  Seizures  - Continue Keppra  Wilson disease And Chronic liver disease and cirrhosis  - Continue Lasix, Aldactone, rifaximin, Inderal  - monitor sodium closely, may need to decrease Lasix if sodium starts to trend down further  UTI (urinary tract infection)  - he was started on rocephin and he can be discharged on keflex. Follow urine cultures and narrow the antibiotics.  - Started on Pyridium for symptomatic treatment for dysuria     Antibiotics:  rocephin  HPI/Subjective:  burning urination resolved.  Comfortable. No new complaints.  Objective: Filed Vitals:   08/01/13 1307  BP: 119/54  Pulse: 76  Temp: 97.4 F (36.3 C)  Resp:     Intake/Output Summary (Last 24 hours) at 08/01/13 1830 Last data filed at 08/01/13 1504  Gross per 24 hour  Intake    680 ml  Output   1550 ml  Net   -870 ml   Filed Weights   07/24/13 1631  Weight: 79.379 kg (175 lb)    Exam:   General:  Alert afebrile comfortable  Cardiovascular: s1s2  Respiratory: CTAB  Abdomen: soft NT ND BS+  Musculoskeletal: no pedal edema.   Data Reviewed: Basic Metabolic Panel:  Recent Labs Lab 07/26/13 0520 07/27/13 0410 07/28/13 0300 07/31/13 0335  NA 131* 130* 129* 133*  K 4.7 4.2 4.2 4.0  CL 102 100 99 104  CO2 25 26 26 22   GLUCOSE 105* 97 107* 98  BUN 12 11 11 17   CREATININE 0.72 0.69 0.74 0.79  CALCIUM 8.7 8.4 8.2* 8.3*   Liver Function Tests:  Recent Labs Lab 07/26/13 0520 07/27/13 0410 07/28/13 0300  AST 47* 64* 58*  ALT 31 29 28   ALKPHOS 95 94 94  BILITOT 3.2* 2.5* 2.1*  PROT 4.8* 4.6* 4.3*  ALBUMIN 2.2* 2.1* 1.9*   No results found for this basename: LIPASE, AMYLASE,  in the last 168 hours No results found for this basename: AMMONIA,  in the last 168 hours CBC:  Recent Labs Lab 07/26/13 1135  WBC 7.5  NEUTROABS 4.6  HGB  10.4*  HCT 29.9*  MCV 98.0  PLT 45*   Cardiac Enzymes: No results found for this basename: CKTOTAL, CKMB, CKMBINDEX, TROPONINI,  in the last 168 hours BNP (last 3 results) No results found for this basename: PROBNP,  in the last 8760 hours CBG: No results found for this basename: GLUCAP,  in the last 168 hours  Recent Results (from the past 240 hour(s))  SURGICAL PCR SCREEN     Status: None   Collection Time    07/25/13 10:22 AM      Result Value Range Status   MRSA, PCR NEGATIVE  NEGATIVE Final   Staphylococcus aureus NEGATIVE  NEGATIVE Final   Comment:            The Xpert SA Assay (FDA     approved for NASAL specimens     in patients over 6 years of age),     is one component of     a comprehensive surveillance     program.  Test performance has     been validated by [employer] for patients greater     than or equal to 77 year old.     It is not intended     to diagnose infection nor to     guide or  monitor treatment.     Studies: No results found.  Scheduled Meds: . aspirin EC  325 mg Oral BID  . cefTRIAXone (ROCEPHIN)  IV  1 g Intravenous Q24H  . furosemide  60 mg Oral BID  . levETIRAcetam  1,000 mg Oral BID  . potassium chloride  10 mEq Oral BID  . propranolol  10 mg Oral BID  . rifaximin  550 mg Oral BID  . senna  1 tablet Oral BID  . spironolactone  150 mg Oral Daily  . zinc sulfate  220 mg Oral TID   Continuous Infusions: . sodium chloride 125 mL/hr at 07/26/13 0436  . lactated ringers 20 mL/hr at 07/25/13 1830    Principal Problem:   Tibial plateau fracture, tibial shaft fracture Active Problems:   Bipolar affective disorder   Seizures   Wilson disease   Chronic liver disease and cirrhosis   UTI (urinary tract infection)    Time spent: 15 min    [name]  [facility] Pager [phone]. If 7PM-7AM, please contact night-coverage at [email or web address], password [facility] 08/01/2013, 6:30 PM  LOS: 8 days

## 2013-08-02 DIAGNOSIS — N390 Urinary tract infection, site not specified: Secondary | ICD-10-CM

## 2013-08-02 MED ORDER — DEXTROSE 5 % IV SOLN
1.0000 g | Freq: Once | INTRAVENOUS | Status: AC
  Administered 2013-08-02: 1 g via INTRAVENOUS
  Filled 2013-08-02: qty 10

## 2013-08-02 NOTE — Progress Notes (Signed)
Physical Therapy Treatment Patient Details Name: [name] MRN: [id number] DOB: October 08, 1965 Today's Date: 08/02/2013 Time: 4098-1191 PT Time Calculation (min): 23 min  PT Assessment / Plan / Recommendation  History of Present Illness Closed right lateral tibial plateau fracture with oblique proximal third tibial shaft fracture and minimally displaced distal fibular shaft fracture..  Underwent ORIF 11/21.   PT Comments   Pt now intends to go home post acute stay. Discharge plan updated and goals updated today. Stair education intiated today, needs additional practice prior to discharge home with prn supervision.   Follow Up Recommendations  Home health PT;Supervision - Intermittent     Equipment Recommendations  Rolling walker with 5" wheels;3in1 (PT)    Frequency Min 6X/week   Progress towards PT Goals Progress towards PT goals: Progressing toward goals;Goals met and updated - see care plan  Plan Discharge plan needs to be updated    Precautions / Restrictions Precautions Precautions: Fall Required Braces or Orthoses: Other Brace/Splint Knee Immobilizer - Right: On at all times Other Brace/Splint: bledsoe brace RLE Restrictions Weight Bearing Restrictions: Yes RLE Weight Bearing: Non weight bearing    Mobility  Bed Mobility Supine to Sit: 6: Modified independent (Device/Increase time);HOB flat Sitting - Scoot to Edge of Bed: 6: Modified independent (Device/Increase time) Sit to Supine: 6: Modified independent (Device/Increase time);HOB flat Scooting to HOB: 6: Modified independent (Device/Increase time) Details for Bed Mobility Assistance: Pt uses UE's to manage RLE to EOB/OOB Transfers Sit to Stand: 5: Supervision;From bed;From chair/3-in-1;With upper extremity assist Stand to Sit: 5: Supervision;To bed;To chair/3-in-1;With upper extremity assist Details for Transfer Assistance: Pt demonstrates safe hand placement.   Ambulation/Gait Ambulation/Gait Assistance: 5:  Supervision Ambulation Distance (Feet): 80 Feet Assistive device: Rolling walker Ambulation/Gait Assistance Details: maintains NWB throughout gait without issues. no shaking noted today with increased steadiness with gait. Gait Pattern: Step-to pattern Gait velocity: decreased Stairs: Yes Stairs Assistance: 4: Min guard Stair Management Technique: Step to pattern;Backwards;With walker Number of Stairs: 1      PT Goals (current goals can now be found in the care plan section) Acute Rehab PT Goals Patient Stated Goal: get back to riding his bike PT Goal Formulation: With patient Time For Goal Achievement: 08/09/13 Potential to Achieve Goals: Good  Visit Information  Last PT Received On: 08/02/13 Assistance Needed: +1 History of Present Illness: Closed right lateral tibial plateau fracture with oblique proximal third tibial shaft fracture and minimally displaced distal fibular shaft fracture..  Underwent ORIF 11/21.    Subjective Data  Patient Stated Goal: get back to riding his bike   Cognition  Cognition Arousal/Alertness: Awake/alert Behavior During Therapy: WFL for tasks assessed/performed Overall Cognitive Status: Within Functional Limits for tasks assessed    End of Session PT - End of Session Equipment Utilized During Treatment: Gait belt Activity Tolerance: Patient tolerated treatment well Patient left: in bed;with call bell/phone within reach Nurse Communication: Mobility status   GP     Sallyanne Kuster 08/02/2013, 11:49 AM  Sallyanne Kuster, PTA Office- [phone]

## 2013-08-02 NOTE — Progress Notes (Signed)
Patient refuses IV fluids to kept at [facility]. He wants the fluids to be disconnected at night and restarted during the daytime or only as needed.

## 2013-08-02 NOTE — Progress Notes (Signed)
[facility] PROGRESS NOTE  [name] ZOX:[id number] DOB: [birth year] DOA: 07/24/2013 PCP: Oliver Barre, MD  Assessment/Plan:  Seizures  - Continue Keppra  Wilson disease And Chronic liver disease and cirrhosis  - Continue Lasix, Aldactone, rifaximin, Inderal  - monitor sodium closely, may need to decrease Lasix if sodium starts to trend down further  UTI (urinary tract infection)  - he was started on rocephin and he completed 5 days of IV antibiotics. Unfortunately urine cultures were not done. And his symptoms resolved. Hence we will discontinue rocephin.  - Started on Pyridium for symptomatic treatment for dysuria     Antibiotics:  rocephin  HPI/Subjective:  burning urination resolved.  Comfortable. No new complaints.  Objective: Filed Vitals:   08/02/13 1404  BP: 130/60  Pulse: 72  Temp: 98.3 F (36.8 C)  Resp:     Intake/Output Summary (Last 24 hours) at 08/02/13 1612 Last data filed at 08/02/13 1414  Gross per 24 hour  Intake   1520 ml  Output   2600 ml  Net  -1080 ml   Filed Weights   07/24/13 1631  Weight: 79.379 kg (175 lb)    Exam:   General:  Alert afebrile comfortable  Cardiovascular: s1s2  Respiratory: CTAB  Abdomen: soft NT ND BS+  Musculoskeletal: no pedal edema.   Data Reviewed: Basic Metabolic Panel:  Recent Labs Lab 07/27/13 0410 07/28/13 0300 07/31/13 0335  NA 130* 129* 133*  K 4.2 4.2 4.0  CL 100 99 104  CO2 26 26 22   GLUCOSE 97 107* 98  BUN 11 11 17   CREATININE 0.69 0.74 0.79  CALCIUM 8.4 8.2* 8.3*   Liver Function Tests:  Recent Labs Lab 07/27/13 0410 07/28/13 0300  AST 64* 58*  ALT 29 28  ALKPHOS 94 94  BILITOT 2.5* 2.1*  PROT 4.6* 4.3*  ALBUMIN 2.1* 1.9*   No results found for this basename: LIPASE, AMYLASE,  in the last 168 hours No results found for this basename: AMMONIA,  in the last 168 hours CBC: No results found for this basename: WBC, NEUTROABS, HGB, HCT, MCV, PLT,  in the last 168  hours Cardiac Enzymes: No results found for this basename: CKTOTAL, CKMB, CKMBINDEX, TROPONINI,  in the last 168 hours BNP (last 3 results) No results found for this basename: PROBNP,  in the last 8760 hours CBG: No results found for this basename: GLUCAP,  in the last 168 hours  Recent Results (from the past 240 hour(s))  SURGICAL PCR SCREEN     Status: None   Collection Time    07/25/13 10:22 AM      Result Value Range Status   MRSA, PCR NEGATIVE  NEGATIVE Final   Staphylococcus aureus NEGATIVE  NEGATIVE Final   Comment:            The Xpert SA Assay (FDA     approved for NASAL specimens     in patients over 76 years of age),     is one component of     a comprehensive surveillance     program.  Test performance has     been validated by [employer] for patients greater     than or equal to 53 year old.     It is not intended     to diagnose infection nor to     guide or monitor treatment.     Studies: No results found.  Scheduled Meds: . aspirin EC  325 mg  Oral BID  . cefTRIAXone (ROCEPHIN)  IV  1 g Intravenous Q24H  . furosemide  60 mg Oral BID  . levETIRAcetam  1,000 mg Oral BID  . potassium chloride  10 mEq Oral BID  . propranolol  10 mg Oral BID  . rifaximin  550 mg Oral BID  . senna  1 tablet Oral BID  . spironolactone  150 mg Oral Daily  . zinc sulfate  220 mg Oral TID   Continuous Infusions: . sodium chloride 125 mL/hr at 07/26/13 0436  . lactated ringers 20 mL/hr at 07/25/13 1830    Principal Problem:   Tibial plateau fracture, tibial shaft fracture Active Problems:   Bipolar affective disorder   Seizures   Wilson disease   Chronic liver disease and cirrhosis   UTI (urinary tract infection)    Time spent: 15 min    Christiona Siddique  [facility] Pager [phone]. If 7PM-7AM, please contact night-coverage at [email or web address], password [facility] 08/02/2013, 4:12 PM  LOS: 9 days

## 2013-08-02 NOTE — Progress Notes (Signed)
Agree with goal updates and discharge plan. 08/02/2013 Corlis Hove, PT [phone]

## 2013-08-03 NOTE — Progress Notes (Signed)
[facility] PROGRESS NOTE  Philip Kotlyar ZOX:[id number] DOB: 1966/08/15 DOA: 07/24/2013 PCP: Oliver Barre, MD  Assessment/Plan:  Seizures  - Continue Keppra  Wilson disease And Chronic liver disease and cirrhosis  - Continue Lasix, Aldactone, rifaximin, Inderal   UTI (urinary tract infection)  - he was started on rocephin and he completed 5 days of IV antibiotics. Unfortunately urine cultures were not done. And his symptoms resolved. Hence we will discontinue rocephin.  - Started on Pyridium for symptomatic treatment for dysuria    Will sign off, please call for questions.     Antibiotics:  rocephin  HPI/Subjective:  burning urination resolved.  Comfortable. No new complaints.  Objective: Filed Vitals:   08/03/13 1547  BP:   Pulse:   Temp:   Resp: 16    Intake/Output Summary (Last 24 hours) at 08/03/13 1642 Last data filed at 08/03/13 1630  Gross per 24 hour  Intake   1440 ml  Output   2170 ml  Net   -730 ml   Filed Weights   07/24/13 1631  Weight: 79.379 kg (175 lb)    Exam:   General:  Alert afebrile comfortable  Cardiovascular: s1s2  Respiratory: CTAB  Abdomen: soft NT ND BS+  Musculoskeletal: no pedal edema.   Data Reviewed: Basic Metabolic Panel:  Recent Labs Lab 07/28/13 0300 07/31/13 0335  NA 129* 133*  K 4.2 4.0  CL 99 104  CO2 26 22  GLUCOSE 107* 98  BUN 11 17  CREATININE 0.74 0.79  CALCIUM 8.2* 8.3*   Liver Function Tests:  Recent Labs Lab 07/28/13 0300  AST 58*  ALT 28  ALKPHOS 94  BILITOT 2.1*  PROT 4.3*  ALBUMIN 1.9*   No results found for this basename: LIPASE, AMYLASE,  in the last 168 hours No results found for this basename: AMMONIA,  in the last 168 hours CBC: No results found for this basename: WBC, NEUTROABS, HGB, HCT, MCV, PLT,  in the last 168 hours Cardiac Enzymes: No results found for this basename: CKTOTAL, CKMB, CKMBINDEX, TROPONINI,  in the last 168 hours BNP (last 3 results) No results  found for this basename: PROBNP,  in the last 8760 hours CBG: No results found for this basename: GLUCAP,  in the last 168 hours  Recent Results (from the past 240 hour(s))  SURGICAL PCR SCREEN     Status: None   Collection Time    07/25/13 10:22 AM      Result Value Range Status   MRSA, PCR NEGATIVE  NEGATIVE Final   Staphylococcus aureus NEGATIVE  NEGATIVE Final   Comment:            The Xpert SA Assay (FDA     approved for NASAL specimens     in patients over 57 years of age),     is one component of     a comprehensive surveillance     program.  Test performance has     been validated by [employer] for patients greater     than or equal to 74 year old.     It is not intended     to diagnose infection nor to     guide or monitor treatment.     Studies: No results found.  Scheduled Meds: . aspirin EC  325 mg Oral BID  . furosemide  60 mg Oral BID  . levETIRAcetam  1,000 mg Oral BID  . potassium chloride  10 mEq Oral  BID  . propranolol  10 mg Oral BID  . rifaximin  550 mg Oral BID  . senna  1 tablet Oral BID  . spironolactone  150 mg Oral Daily  . zinc sulfate  220 mg Oral TID   Continuous Infusions: . sodium chloride 125 mL/hr at 07/26/13 0436  . lactated ringers 20 mL/hr at 07/25/13 1830    Principal Problem:   Tibial plateau fracture, tibial shaft fracture Active Problems:   Bipolar affective disorder   Seizures   Wilson disease   Chronic liver disease and cirrhosis   UTI (urinary tract infection)    Time spent: 15 min    Mcdaniel Ohms  [facility] Pager [phone]. If 7PM-7AM, please contact night-coverage at [email or web address], password [facility] 08/03/2013, 4:42 PM  LOS: 10 days

## 2013-08-04 ENCOUNTER — Other Ambulatory Visit: Payer: Self-pay | Admitting: Internal Medicine

## 2013-08-04 MED ORDER — HYDROCODONE-ACETAMINOPHEN 5-325 MG PO TABS: 1.0000 | ORAL_TABLET | Freq: Four times a day (QID) | ORAL | Status: DC | PRN

## 2013-08-04 MED ORDER — ASPIRIN EC 325 MG PO TBEC: 325.0000 mg | DELAYED_RELEASE_TABLET | Freq: Two times a day (BID) | ORAL | Status: DC

## 2013-08-04 MED ORDER — METHOCARBAMOL 500 MG PO TABS: 500.0000 mg | ORAL_TABLET | Freq: Four times a day (QID) | ORAL | Status: DC | PRN

## 2013-08-04 NOTE — Progress Notes (Signed)
   Subjective:  Patient reports pain as mild.  No events  Objective:   VITALS:   Filed Vitals:   08/03/13 1531 08/03/13 1547 08/03/13 2202 08/04/13 0634  BP: 105/53  117/43 101/47  Pulse: 73  73 70  Temp: 97.6 F (36.4 C)  98.7 F (37.1 C) 98.2 F (36.8 C)  TempSrc: Oral  Oral Oral  Resp: 20 16 16 19   Height:      Weight:      SpO2: 100% 100% 100% 100%    Neurologically intact Neurovascular intact Sensation intact distally Intact pulses distally Dorsiflexion/Plantar flexion intact Incision: no drainage No cellulitis present Compartment soft   Lab Results  Component Value Date   WBC 7.5 07/26/2013   HGB 10.4* 07/26/2013   HCT 29.9* 07/26/2013   MCV 98.0 07/26/2013   PLT 45* 07/26/2013     Assessment/Plan: 10 Days Post-Op   Problem List Items Addressed This Visit     Digestive   Wilson disease   Chronic liver disease and cirrhosis     Musculoskeletal and Integument   *Tibial plateau fracture, tibial shaft fracture - Primary   Relevant Orders      Non weight bearing     Genitourinary   UTI (urinary tract infection)   Relevant Medications      rifaximin (XIFAXAN) tablet 550 mg      influenza vac split quadrivalent PF (FLUARIX) injection 0.5 mL (Completed)      pneumococcal 23 valent vaccine (PNU-IMMUNE) injection 0.5 mL (Completed)      ceFAZolin (ANCEF) IVPB 2 g/50 mL premix (Completed)      phenazopyridine (PYRIDIUM) tablet 100 mg (Completed)     Hematopoietic and Hemostatic   Thrombocytopenia     Other   Bipolar affective disorder   Depression   Seizures   Relevant Medications      levETIRAcetam (KEPPRA) 1000 MG tablet      levETIRAcetam (KEPPRA) tablet 1,000 mg      Up with therapy Up with PT/OT DVT ppx - SCDs, ambulation, aspirin NWB right and lower extremity Finished abx for UTI Pain control Discharge planning   Cheral Almas 08/04/2013, 7:58 AM [phone]

## 2013-08-04 NOTE — Progress Notes (Signed)
Physical Therapy Treatment Patient Details Name: [name] MRN: [id number] DOB: [birth year] Today's Date: 08/04/2013 Time: 4098-1191 PT Time Calculation (min): 26 min  PT Assessment / Plan / Recommendation  History of Present Illness Closed right lateral tibial plateau fracture with oblique proximal third tibial shaft fracture and minimally displaced distal fibular shaft fracture..  Underwent ORIF 11/21.   PT Comments   Pt overall moves well.  Stair education cont'd today with pt requiring min (A) for balance & RW management.  Cont with current POC to maximize functional mobility & safety prior to d/c home.        Home health PT;Supervision - Intermittent     Does the patient have the potential to tolerate intense rehabilitation     Barriers to Discharge        Equipment Recommendations  Rolling walker with 5" wheels;3in1 (PT)    Recommendations for Other Services    Frequency Min 6X/week   Progress towards PT Goals Progress towards PT goals: Progressing toward goals  Plan      Precautions / Restrictions Precautions Precautions: Fall Knee Immobilizer - Right: On at all times Other Brace/Splint: bledsoe brace RLE Restrictions RLE Weight Bearing: Non weight bearing       Mobility  Bed Mobility Bed Mobility: Supine to Sit;Sitting - Scoot to Edge of Bed Supine to Sit: 6: Modified independent (Device/Increase time);HOB flat Sitting - Scoot to Edge of Bed: 6: Modified independent (Device/Increase time) Details for Bed Mobility Assistance: Pt uses UE's to manage RLE to EOB/OOB Transfers Transfers: Sit to Stand;Stand to Sit Sit to Stand: 6: Modified independent (Device/Increase time);With upper extremity assist;From bed Stand to Sit: 6: Modified independent (Device/Increase time);With upper extremity assist;With armrests;To chair/3-in-1 Details for Transfer Assistance: Pt demonstrates safe hand placement.   Ambulation/Gait Ambulation/Gait Assistance: 5:  Supervision Ambulation Distance (Feet): 60 Feet Assistive device: Rolling walker Gait Pattern: Step-to pattern Gait velocity: decreased Stairs: Yes Stairs Assistance: 4: Min assist Stairs Assistance Details (indicate cue type and reason): (A) for balance & to bring RW up onto step Stair Management Technique: Step to pattern;Backwards;With walker Number of Stairs: 1 (2x's) Wheelchair Mobility Wheelchair Mobility: No       PT Goals (current goals can now be found in the care plan section) Acute Rehab PT Goals PT Goal Formulation: With patient Time For Goal Achievement: 08/09/13 Potential to Achieve Goals: Good  Visit Information  Last PT Received On: 08/04/13 Assistance Needed: +1 History of Present Illness: Closed right lateral tibial plateau fracture with oblique proximal third tibial shaft fracture and minimally displaced distal fibular shaft fracture..  Underwent ORIF 11/21.    Subjective Data      Cognition  Cognition Arousal/Alertness: Awake/alert Behavior During Therapy: WFL for tasks assessed/performed Overall Cognitive Status: Within Functional Limits for tasks assessed    Balance     End of Session PT - End of Session Activity Tolerance: Patient tolerated treatment well Patient left: in chair;with call bell/phone within reach Nurse Communication: Mobility status   GP     Lara Mulch 08/04/2013, 11:51 AM  Verdell Face, PTA [phone] 08/04/2013

## 2013-08-04 NOTE — Progress Notes (Signed)
Occupational Therapy Treatment Patient Details Name: [name] MRN: [id number] DOB: August 18, 1966 Today's Date: 08/04/2013 Time: 2130-8657 OT Time Calculation (min): 27 min  OT Assessment / Plan / Recommendation  History of present illness Closed right lateral tibial plateau fracture with oblique proximal third tibial shaft fracture and minimally displaced distal fibular shaft fracture..  Underwent ORIF 11/21.   OT comments  Pt. Awake and agreeable to eob but declines oob secondary to c/o pain in RLE. Reviewed use of A/E for lb adls.  Answered pt. Questions for toileting and grooming.  States he feels he is ready for d/c.                 Equipment Recommendations  3 in 1 bedside comode        Frequency Min 2X/week   Progress towards OT Goals Progress towards OT goals: Progressing toward goals  Plan Discharge plan remains appropriate    Precautions / Restrictions Precautions Precautions: Fall Required Braces or Orthoses: Other Brace/Splint Knee Immobilizer - Right: On at all times Other Brace/Splint: bledsoe brace RLE Restrictions RLE Weight Bearing: Non weight bearing   Pertinent Vitals/Pain 7/10, rn notified and came to room to provide pain meds for pt.    ADL  Lower Body Bathing: Simulated;Minimal assistance Where Assessed - Lower Body Bathing: Unsupported sitting Lower Body Dressing: Simulated;Supervision/safety Where Assessed - Lower Body Dressing: Unsupported sitting Transfers/Ambulation Related to ADLs: pt. declined oob stating he felt comfortable with the process and was in too much pain to complete requested tasks ADL Comments: reviewed use of a/e ie: reacher for assistance with LB dressing. pt. in shorts and states he used reacher to put them on earlier.  states he will be using urinals at home secondary to frequent urination due to lasix.  declned further practice with adls     OT Goals(current goals can now be found in the care plan section)    Visit  Information  Last OT Received On: 08/04/13 Assistance Needed: +1 History of Present Illness: Closed right lateral tibial plateau fracture with oblique proximal third tibial shaft fracture and minimally displaced distal fibular shaft fracture..  Underwent ORIF 11/21.                 Cognition  Cognition Arousal/Alertness: Awake/alert Behavior During Therapy: WFL for tasks assessed/performed Overall Cognitive Status: Within Functional Limits for tasks assessed    Mobility  Bed Mobility Bed Mobility: Supine to Sit;Sitting - Scoot to Edge of Bed Supine to Sit: 6: Modified independent (Device/Increase time);HOB flat Sitting - Scoot to Edge of Bed: 6: Modified independent (Device/Increase time) Details for Bed Mobility Assistance: pt. able to get in/eob with hob flat and no rails with use of B UES to manage RLE  Transfers Sit to Stand: 6: Modified independent (Device/Increase time);With upper extremity assist;From bed Stand to Sit: 6: Modified independent (Device/Increase time);With upper extremity assist;With armrests;To chair/3-in-1 Details for Transfer Assistance: pt. declined transfers               End of Session OT - End of Session Activity Tolerance: Patient tolerated treatment well Patient left: in bed;with call bell/phone within reach;with nursing/sitter in room       Robet Leu, COTA/L 08/04/2013, 2:54 PM

## 2013-08-04 NOTE — Care Management Note (Signed)
08/04/13  10:57  Joel Peper, RN BSN CM spoke with patient concerning Home Health and DME needs. Choice offered. Contacted [place] with [phone].Spoke with Jill Alexanders requesting rolling walker and 3in1 be delivered to patient's room. Patient informed that doctor plans to discharge him today and that he should arrange for help at home and transportation.
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
## 2013-11-06 ENCOUNTER — Other Ambulatory Visit: Payer: Self-pay | Admitting: Orthopaedic Surgery

## 2013-11-06 DIAGNOSIS — R609 Edema, unspecified: Secondary | ICD-10-CM

## 2013-11-06 DIAGNOSIS — M25561 Pain in right knee: Secondary | ICD-10-CM

## 2013-11-06 DIAGNOSIS — R531 Weakness: Secondary | ICD-10-CM

## 2013-11-20 ENCOUNTER — Encounter: Payer: Self-pay | Admitting: Neurology

## 2013-11-27 ENCOUNTER — Ambulatory Visit
Admission: RE | Admit: 2013-11-27 | Discharge: 2013-11-27 | Disposition: A | Discharge status: Home / Self Care | Payer: Commercial Managed Care - HMO | Source: Ambulatory Visit | Attending: Orthopaedic Surgery | Admitting: Orthopaedic Surgery

## 2013-11-27 DIAGNOSIS — R609 Edema, unspecified: Secondary | ICD-10-CM

## 2013-11-27 DIAGNOSIS — R531 Weakness: Secondary | ICD-10-CM

## 2013-11-27 DIAGNOSIS — M25561 Pain in right knee: Secondary | ICD-10-CM

## 2013-11-28 ENCOUNTER — Ambulatory Visit: Payer: Self-pay | Admitting: Neurology

## 2013-12-10 ENCOUNTER — Telehealth: Payer: Self-pay | Admitting: Internal Medicine

## 2013-12-10 NOTE — Telephone Encounter (Signed)
Received a referral from [facility] Dr. Rosaland Lao. Drue Novel, MD  Address: [address] [place], [place] [address]  [phone] Authorization [phone] visit 1 start date 11-22-2013 end 02-26-2014 For service MRI 45859 mri abdomen/w/o contrast for dx 571.5 cirrhosis of liver   Fax:to  [phone]

## 2014-01-19 ENCOUNTER — Other Ambulatory Visit: Payer: Self-pay | Admitting: Neurology

## 2014-04-30 ENCOUNTER — Telehealth: Payer: Self-pay

## 2014-04-30 ENCOUNTER — Other Ambulatory Visit (INDEPENDENT_AMBULATORY_CARE_PROVIDER_SITE_OTHER): Payer: Commercial Managed Care - HMO

## 2014-04-30 ENCOUNTER — Ambulatory Visit (INDEPENDENT_AMBULATORY_CARE_PROVIDER_SITE_OTHER): Payer: Commercial Managed Care - HMO | Admitting: Internal Medicine

## 2014-04-30 ENCOUNTER — Encounter: Payer: Self-pay | Admitting: Internal Medicine

## 2014-04-30 VITALS — BP 130/80 | HR 74 | Temp 98.1°F | Wt 169.1 lb

## 2014-04-30 DIAGNOSIS — R109 Unspecified abdominal pain: Secondary | ICD-10-CM

## 2014-04-30 DIAGNOSIS — E871 Hypo-osmolality and hyponatremia: Secondary | ICD-10-CM

## 2014-04-30 DIAGNOSIS — R197 Diarrhea, unspecified: Secondary | ICD-10-CM

## 2014-04-30 DIAGNOSIS — R1030 Lower abdominal pain, unspecified: Secondary | ICD-10-CM

## 2014-04-30 DIAGNOSIS — Z23 Encounter for immunization: Secondary | ICD-10-CM

## 2014-04-30 DIAGNOSIS — R7309 Other abnormal glucose: Secondary | ICD-10-CM

## 2014-04-30 DIAGNOSIS — R7302 Impaired glucose tolerance (oral): Secondary | ICD-10-CM

## 2014-04-30 LAB — URINALYSIS, ROUTINE W REFLEX MICROSCOPIC
Hgb urine dipstick: NEGATIVE
Leukocytes, UA: NEGATIVE
Nitrite: NEGATIVE
Specific Gravity, Urine: 1.01 (ref 1.000–1.030)
Total Protein, Urine: NEGATIVE
Urine Glucose: NEGATIVE
Urobilinogen, UA: 2 — AB (ref 0.0–1.0)
pH: 6.5 (ref 5.0–8.0)

## 2014-04-30 LAB — CBC WITH DIFFERENTIAL/PLATELET
Basophils Absolute: 0.1 10*3/uL (ref 0.0–0.1)
Basophils Relative: 1.2 % (ref 0.0–3.0)
Eosinophils Absolute: 0.1 10*3/uL (ref 0.0–0.7)
Eosinophils Relative: 1.7 % (ref 0.0–5.0)
HCT: 38.9 % — ABNORMAL LOW (ref 39.0–52.0)
Hemoglobin: 13.3 g/dL (ref 13.0–17.0)
Lymphocytes Relative: 14.6 % (ref 12.0–46.0)
Lymphs Abs: 0.8 10*3/uL (ref 0.7–4.0)
MCHC: 34.1 g/dL (ref 30.0–36.0)
MCV: 98.4 fl (ref 78.0–100.0)
Monocytes Absolute: 0.7 10*3/uL (ref 0.1–1.0)
Monocytes Relative: 13.3 % — ABNORMAL HIGH (ref 3.0–12.0)
Neutro Abs: 3.5 10*3/uL (ref 1.4–7.7)
Neutrophils Relative %: 69.2 % (ref 43.0–77.0)
Platelets: 49 10*3/uL — CL (ref 150.0–400.0)
RBC: 3.95 Mil/uL — ABNORMAL LOW (ref 4.22–5.81)
RDW: 15.8 % — ABNORMAL HIGH (ref 11.5–15.5)
WBC: 5.1 10*3/uL (ref 4.0–10.5)

## 2014-04-30 LAB — PROTIME-INR
INR: 1.4 ratio — ABNORMAL HIGH (ref 0.8–1.0)
Prothrombin Time: 15.4 s — ABNORMAL HIGH (ref 9.6–13.1)

## 2014-04-30 MED ORDER — POTASSIUM CHLORIDE ER 10 MEQ PO TBCR: 10.0000 meq | EXTENDED_RELEASE_TABLET | Freq: Two times a day (BID) | ORAL | Status: DC

## 2014-04-30 MED ORDER — METRONIDAZOLE 250 MG PO TABS
250.0000 mg | ORAL_TABLET | Freq: Three times a day (TID) | ORAL | Status: DC
Start: 2014-04-30 — End: 2014-09-16

## 2014-04-30 NOTE — Progress Notes (Signed)
Pre visit review using our clinic review tool, if applicable. No additional management support is needed unless otherwise documented below in the visit note. 

## 2014-04-30 NOTE — Patient Instructions (Addendum)
You had the flu shot today  Please take all new medication as prescribed- the flagyl antibiotic  You can also take OTC immodium as needed  Please continue all other medications as before, and refills have been done if requested.  Please have the pharmacy call with any other refills you may need.  Please keep your appointments with your specialists as you may have planned  Please go to the LAB in the Basement (turn left off the elevator) for the tests to be done today  You will be contacted by phone if any changes need to be made immediately.  Otherwise, you will receive a letter about your results with an explanation, but please check with MyChart first.  Please remember to sign up for MyChart if you have not done so, as this will be important to you in the future with finding out test results, communicating by private email, and scheduling acute appointments online when needed.
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
## 2014-04-30 NOTE — Telephone Encounter (Signed)
Critical Lab Platelets 49

## 2014-05-01 ENCOUNTER — Other Ambulatory Visit: Payer: Self-pay | Admitting: Internal Medicine

## 2014-05-01 LAB — BASIC METABOLIC PANEL
BUN: 9 mg/dL (ref 6–23)
CO2: 30 mEq/L (ref 19–32)
Calcium: 9.4 mg/dL (ref 8.4–10.5)
Chloride: 100 mEq/L (ref 96–112)
Creatinine, Ser: 0.6 mg/dL (ref 0.4–1.5)
GFR: 148.7 mL/min (ref >60.00)
Glucose, Bld: 91 mg/dL (ref 70–99)
Potassium: 2.9 mEq/L — ABNORMAL LOW (ref 3.5–5.1)
Sodium: 133 mEq/L — ABNORMAL LOW (ref 135–145)

## 2014-05-01 LAB — SEDIMENTATION RATE: Sed Rate: 12 mm/hr (ref 0–22)

## 2014-05-01 LAB — HEPATIC FUNCTION PANEL
ALT: 24 U/L (ref 0–53)
AST: 32 U/L (ref 0–37)
Albumin: 3.1 g/dL — ABNORMAL LOW (ref 3.5–5.2)
Alkaline Phosphatase: 135 U/L — ABNORMAL HIGH (ref 39–117)
Bilirubin, Direct: 0.8 mg/dL — ABNORMAL HIGH (ref 0.0–0.3)
Total Bilirubin: 4 mg/dL — ABNORMAL HIGH (ref 0.2–1.2)
Total Protein: 6.1 g/dL (ref 6.0–8.3)

## 2014-05-01 LAB — AMMONIA: Ammonia: 43 umol/L — ABNORMAL HIGH (ref 11–35)

## 2014-05-01 LAB — LIPASE: Lipase: 40 U/L (ref 11.0–59.0)

## 2014-05-01 MED ORDER — POTASSIUM CHLORIDE ER 10 MEQ PO TBCR: 10.0000 meq | EXTENDED_RELEASE_TABLET | Freq: Three times a day (TID) | ORAL | Status: DC

## 2014-05-02 LAB — URINE CULTURE
Colony Count: NO GROWTH
Organism ID, Bacteria: NO GROWTH

## 2014-05-04 ENCOUNTER — Ambulatory Visit: Payer: Self-pay | Admitting: Neurology

## 2014-05-04 ENCOUNTER — Telehealth: Payer: Self-pay | Admitting: Neurology

## 2014-05-04 NOTE — Telephone Encounter (Signed)
This patient did not show for a revisit appointment today. 

## 2014-05-06 DIAGNOSIS — E871 Hypo-osmolality and hyponatremia: Secondary | ICD-10-CM | POA: Insufficient documentation

## 2014-05-06 NOTE — Assessment & Plan Note (Signed)
stable overall by history and exam, recent data reviewed with pt, and pt to continue medical treatment as before,  to f/u any worsening symptoms or concerns Lab Results  Component Value Date   HGBA1C 4.4* 01/07/2013

## 2014-05-06 NOTE — Assessment & Plan Note (Signed)
With fatigue, o/w asympt, for f/u lab

## 2014-05-06 NOTE — Assessment & Plan Note (Signed)
Also for ua, r/o recurrent uti,  to f/u any worsening symptoms or concerns

## 2014-05-06 NOTE — Assessment & Plan Note (Signed)
?   Etiology , for stool studies, empiric flagyl asd, immodium prn,  to f/u any worsening symptoms or concerns

## 2014-05-25 ENCOUNTER — Telehealth: Payer: Self-pay | Admitting: *Deleted

## 2014-05-25 NOTE — Telephone Encounter (Signed)
Called patient to offer appointment for 11:30 this am. Patient was @ work, he does have 1/16 appointment scheduled.

## 2014-06-09 ENCOUNTER — Other Ambulatory Visit: Payer: Self-pay | Admitting: Internal Medicine

## 2014-06-09 NOTE — Telephone Encounter (Signed)
Patient informed of MD instructions. 

## 2014-06-09 NOTE — Telephone Encounter (Signed)
I have not been the previous prescriber for this medication  Pt has chronic liver dz, and likely needs this, but  Please have pt contact his previous provider for this medication

## 2014-06-09 NOTE — Telephone Encounter (Signed)
Called left message to call back 

## 2014-09-16 ENCOUNTER — Encounter: Payer: Self-pay | Admitting: Neurology

## 2014-09-16 ENCOUNTER — Ambulatory Visit (INDEPENDENT_AMBULATORY_CARE_PROVIDER_SITE_OTHER): Payer: Commercial Managed Care - HMO | Admitting: Neurology

## 2014-09-16 VITALS — BP 114/64 | HR 69 | Ht 72.0 in | Wt 180.0 lb

## 2014-09-16 DIAGNOSIS — R569 Unspecified convulsions: Secondary | ICD-10-CM | POA: Diagnosis not present

## 2014-09-16 MED ORDER — LEVETIRACETAM 1000 MG PO TABS: 1000.0000 mg | ORAL_TABLET | Freq: Two times a day (BID) | ORAL | Status: DC

## 2014-09-16 NOTE — Patient Instructions (Signed)
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
## 2014-10-22 ENCOUNTER — Ambulatory Visit (INDEPENDENT_AMBULATORY_CARE_PROVIDER_SITE_OTHER): Payer: Commercial Managed Care - HMO | Admitting: Internal Medicine

## 2014-10-22 ENCOUNTER — Encounter: Payer: Self-pay | Admitting: Internal Medicine

## 2014-10-22 VITALS — BP 120/74 | HR 64 | Temp 98.1°F | Wt 174.0 lb

## 2014-10-22 DIAGNOSIS — K4090 Unilateral inguinal hernia, without obstruction or gangrene, not specified as recurrent: Secondary | ICD-10-CM | POA: Diagnosis not present

## 2014-10-22 NOTE — Assessment & Plan Note (Signed)
Very large, minimally tender however, assoc with swollen left testicle, but afeb, no GU or GI symptoms, for gen surgury referral

## 2014-10-22 NOTE — Patient Instructions (Signed)
Please continue all other medications as before, and refills have been done if requested.  Please have the pharmacy call with any other refills you may need.  Please keep your appointments with your specialists as you may have planned  You will be contacted regarding the referral for: General Surgury 

## 2014-10-22 NOTE — Progress Notes (Signed)
Pre visit review using our clinic review tool, if applicable. No additional management support is needed unless otherwise documented below in the visit note. 

## 2014-10-22 NOTE — Progress Notes (Signed)
Subjective:    Patient ID: [name], male    DOB: 1965-12-14, 49 y.o.   MRN: [id number]  HPI  Here tp f/u at urging of girlfriend, with increase size of herniation, now quite large, but minimal pain and discomfort and Denies urinary symptoms such as dysuria, frequency, urgency, flank pain, hematuria or n/v, fever, chills.  Denies worsening reflux, abd pain, dysphagia, n/v, bowel change or blood.  Has hx of larger hernia same site repaired at [facility] several yrs ago, but would like to stay local if possible. Past Medical History  Diagnosis Date  . Hernia   . Bipolar affective disorder     [place]  . Depression   . Seizures     Dr Jannifer Franklin  . Abuse, drug or alcohol   . Wilson disease   . Encephalopathy   . Thrombocytopenia   . Chronic liver disease and cirrhosis   . History of alcohol abuse   . Dyslipidemia    Past Surgical History  Procedure Laterality Date  . Left inguinal hernia  june 2012    [place]  . Vascular surgery    . Open reduction internal fixation (orif) tibia/fibula fracture Right 07/25/2013    Procedure: OPEN REDUCTION INTERNAL FIXATION (ORIF) RIGHT TIBIAL PLATEAU, TIBIAL SHAFT AND FIBULA FRACTURES, POSSIBLE FASCIOTOMIES;  Surgeon: Marianna Payment, MD;  Location: [facility];  Service: Orthopedics;  Laterality: Right;    reports that he has been smoking.  He has never used smokeless tobacco. He reports that he does not drink alcohol or use illicit drugs. family history includes Dementia in his mother; Diabetes in his father; Glaucoma in his brother; Heart disease in his other. Allergies  Allergen Reactions  . Codeine Nausea And Vomiting   Current Outpatient Prescriptions on File Prior to Visit  Medication Sig Dispense Refill  . acetaminophen (TYLENOL) 500 MG tablet Take 500 mg by mouth every 6 (six) hours as needed. Take 3 tabs as needed for pain.    . furosemide (LASIX) 20 MG tablet Take 60 mg by mouth 2 (two) times daily.    [name] lactulose  (CEPHULAC) 10 G packet Take by mouth.    . levETIRAcetam (KEPPRA) 1000 MG tablet Take 1 tablet (1,000 mg total) by mouth 2 (two) times daily. 180 tablet 3  . potassium chloride (K-DUR,KLOR-CON) 10 MEQ tablet Take 10 mEq by mouth 2 (two) times daily.    . promethazine (PHENERGAN) 25 MG tablet Take 1 tablet (25 mg total) by mouth every 6 (six) hours as needed for nausea. 30 tablet 1  . propranolol (INDERAL) 10 MG tablet Take 10 mg by mouth. Take one half tab two times daily.    [name] spironolactone (ALDACTONE) 100 MG tablet Take 150 mg by mouth daily. Takes 1.5 tablets    . zinc gluconate 50 MG tablet Take 50 mg by mouth 3 (three) times daily.      No current facility-administered medications on file prior to visit.   Review of Systems All otherwise neg per pt     Objective:   Physical Exam BP 120/74 mmHg  Pulse 64  Temp(Src) 98.1 F (36.7 C) (Oral)  Wt 174 lb (78.926 kg) VS noted,  Constitutional: Pt appears well-developed, well-nourished.  HENT: Head: NCAT.  Right Ear: External ear normal.  Left Ear: External ear normal.  Eyes: . Pupils are equal, round, and reactive to light. Conjunctivae and EOM are normal Neck: Normal range of motion. Neck supple.  Cardiovascular: Normal rate  and regular rhythm.   Pulmonary/Chest: Effort normal and breath sounds without rales or wheezing.  Abd:  Soft, NT, ND, + BS Massive 3+ prob left inguinal hernia causing testicle displacement and extending to distal scrotum Neurological: Pt is alert. Not confused , motor grossly intact Skin: Skin is warm. No rash Psychiatric: Pt behavior is normal. No agitation.      Assessment & Plan:

## 2014-10-23 ENCOUNTER — Telehealth: Payer: Self-pay | Admitting: Internal Medicine

## 2014-10-23 NOTE — Telephone Encounter (Signed)
emmi emailed °

## 2014-12-08 ENCOUNTER — Other Ambulatory Visit: Payer: Self-pay | Admitting: General Surgery

## 2014-12-08 DIAGNOSIS — N5089 Other specified disorders of the male genital organs: Secondary | ICD-10-CM

## 2014-12-08 DIAGNOSIS — N508 Other specified disorders of male genital organs: Secondary | ICD-10-CM | POA: Diagnosis not present

## 2014-12-09 NOTE — Addendum Note (Signed)
Addended by: Odis Hollingshead on: 12/09/2014 09:10 AM   Modules accepted: Orders

## 2014-12-23 ENCOUNTER — Other Ambulatory Visit: Payer: Self-pay | Admitting: *Deleted

## 2014-12-23 DIAGNOSIS — N5089 Other specified disorders of the male genital organs: Secondary | ICD-10-CM

## 2014-12-24 ENCOUNTER — Ambulatory Visit
Admission: RE | Admit: 2014-12-24 | Discharge: 2014-12-24 | Disposition: A | Discharge status: Home / Self Care | Payer: Commercial Managed Care - HMO | Source: Ambulatory Visit | Attending: General Surgery | Admitting: General Surgery

## 2014-12-24 DIAGNOSIS — N433 Hydrocele, unspecified: Secondary | ICD-10-CM | POA: Diagnosis not present

## 2014-12-24 DIAGNOSIS — K7460 Unspecified cirrhosis of liver: Secondary | ICD-10-CM | POA: Diagnosis not present

## 2014-12-24 DIAGNOSIS — K6389 Other specified diseases of intestine: Secondary | ICD-10-CM | POA: Diagnosis not present

## 2014-12-24 DIAGNOSIS — K766 Portal hypertension: Secondary | ICD-10-CM | POA: Diagnosis not present

## 2014-12-24 MED ORDER — IOPAMIDOL (ISOVUE-300) INJECTION 61%
100.0000 mL | Freq: Once | INTRAVENOUS | Status: AC | PRN
  Administered 2014-12-24: 100 mL via INTRAVENOUS

## 2014-12-30 ENCOUNTER — Other Ambulatory Visit: Payer: Self-pay

## 2014-12-30 DIAGNOSIS — R160 Hepatomegaly, not elsewhere classified: Secondary | ICD-10-CM

## 2014-12-30 NOTE — Addendum Note (Signed)
Addended by: Odis Hollingshead on: 12/30/2014 02:00 PM   Modules accepted: Orders

## 2015-01-05 DIAGNOSIS — K7460 Unspecified cirrhosis of liver: Secondary | ICD-10-CM | POA: Diagnosis not present

## 2015-01-05 DIAGNOSIS — R160 Hepatomegaly, not elsewhere classified: Secondary | ICD-10-CM | POA: Diagnosis not present

## 2015-01-05 DIAGNOSIS — N508 Other specified disorders of male genital organs: Secondary | ICD-10-CM | POA: Diagnosis not present

## 2015-01-05 DIAGNOSIS — K769 Liver disease, unspecified: Secondary | ICD-10-CM | POA: Diagnosis not present

## 2015-01-15 ENCOUNTER — Ambulatory Visit (HOSPITAL_COMMUNITY): Admission: RE | Admit: 2015-01-15 | Payer: Commercial Managed Care - HMO | Source: Ambulatory Visit

## 2015-01-15 ENCOUNTER — Other Ambulatory Visit (HOSPITAL_COMMUNITY): Payer: Self-pay | Admitting: General Surgery

## 2015-01-15 DIAGNOSIS — R160 Hepatomegaly, not elsewhere classified: Secondary | ICD-10-CM

## 2015-01-19 DIAGNOSIS — M25561 Pain in right knee: Secondary | ICD-10-CM | POA: Diagnosis not present

## 2015-01-20 ENCOUNTER — Other Ambulatory Visit (HOSPITAL_COMMUNITY): Payer: Self-pay | Admitting: Orthopaedic Surgery

## 2015-01-21 ENCOUNTER — Ambulatory Visit (HOSPITAL_COMMUNITY)
Admission: RE | Admit: 2015-01-21 | Discharge: 2015-01-21 | Disposition: A | Discharge status: Home / Self Care | Payer: Commercial Managed Care - HMO | Source: Ambulatory Visit | Attending: General Surgery | Admitting: General Surgery

## 2015-01-21 DIAGNOSIS — N62 Hypertrophy of breast: Secondary | ICD-10-CM | POA: Diagnosis not present

## 2015-01-21 DIAGNOSIS — K8020 Calculus of gallbladder without cholecystitis without obstruction: Secondary | ICD-10-CM | POA: Insufficient documentation

## 2015-01-21 DIAGNOSIS — R160 Hepatomegaly, not elsewhere classified: Secondary | ICD-10-CM | POA: Insufficient documentation

## 2015-01-21 DIAGNOSIS — K7460 Unspecified cirrhosis of liver: Secondary | ICD-10-CM | POA: Diagnosis not present

## 2015-01-21 DIAGNOSIS — R161 Splenomegaly, not elsewhere classified: Secondary | ICD-10-CM | POA: Insufficient documentation

## 2015-01-21 DIAGNOSIS — K7689 Other specified diseases of liver: Secondary | ICD-10-CM | POA: Diagnosis not present

## 2015-01-21 DIAGNOSIS — K766 Portal hypertension: Secondary | ICD-10-CM | POA: Diagnosis not present

## 2015-01-21 MED ORDER — GADOXETATE DISODIUM 0.25 MMOL/ML IV SOLN
8.0000 mL | Freq: Once | INTRAVENOUS | Status: AC | PRN
  Administered 2015-01-21: 8 mL via INTRAVENOUS

## 2015-01-21 NOTE — H&P (Signed)
PREOPERATIVE H&P  Chief Complaint: Right knee symptomatic hardware  HPI: [name] is a 49 y.o. male who presents for surgical treatment of Right knee symptomatic hardware.  He denies any changes in medical history.  Past Medical History  Diagnosis Date  . Hernia   . Bipolar affective disorder     [place]  . Depression   . Seizures     Dr Jannifer Franklin  . Abuse, drug or alcohol   . Wilson disease   . Encephalopathy   . Thrombocytopenia   . Chronic liver disease and cirrhosis   . History of alcohol abuse   . Dyslipidemia    Past Surgical History  Procedure Laterality Date  . Left inguinal hernia  june 2012    [place]  . Vascular surgery    . Open reduction internal fixation (orif) tibia/fibula fracture Right 07/25/2013    Procedure: OPEN REDUCTION INTERNAL FIXATION (ORIF) RIGHT TIBIAL PLATEAU, TIBIAL SHAFT AND FIBULA FRACTURES, POSSIBLE FASCIOTOMIES;  Surgeon: Marianna Payment, MD;  Location: [facility];  Service: Orthopedics;  Laterality: Right;   History   Social History  . Marital Status: Single    Spouse Name: N/A  . Number of Children: 2  . Years of Education: HS   Occupational History  . disabled    Social History Main Topics  . Smoking status: Current Every Day Smoker  . Smokeless tobacco: Never Used  . Alcohol Use: No     Comment: 3-4 bottles a week, quit drinking 2005  . Drug Use: No  . Sexual Activity: Yes   Other Topics Concern  . Not on file   Social History Narrative   Patient is right handed.   Patient drinks some caffeine daily.   Family History  Problem Relation Age of Onset  . Heart disease Other   . Diabetes Father   . Dementia Mother   . Glaucoma Brother    Allergies  Allergen Reactions  . Codeine Nausea And Vomiting   Prior to Admission medications   Medication Sig Start Date End Date Taking? Authorizing Provider  acetaminophen (TYLENOL) 500 MG tablet Take 500 mg by mouth every 6 (six) hours as needed  (pain). Take 3 tabs as needed for pain.   Yes Historical Provider, MD  furosemide (LASIX) 20 MG tablet Take 60 mg by mouth 2 (two) times daily.   Yes Historical Provider, MD  lactulose (CEPHULAC) 10 G packet Take 10 g by mouth daily as needed (toxin buildup).  07/03/11  Yes Historical Provider, MD  levETIRAcetam (KEPPRA) 1000 MG tablet Take 1 tablet (1,000 mg total) by mouth 2 (two) times daily. 09/16/14  Yes Kathrynn Ducking, MD  MILK THISTLE PO Take 1 tablet by mouth 2 (two) times daily.   Yes Historical Provider, MD  potassium chloride (K-DUR) 10 MEQ tablet Take 10 mEq by mouth 2 (two) times daily. 01/01/15  Yes Historical Provider, MD  promethazine (PHENERGAN) 25 MG tablet Take 1 tablet (25 mg total) by mouth every 6 (six) hours as needed for nausea. 07/28/13  Yes Leandrew Koyanagi, MD  propranolol (INDERAL) 10 MG tablet Take 10 mg by mouth 2 (two) times daily.    Yes Historical Provider, MD  spironolactone (ALDACTONE) 100 MG tablet Take 150 mg by mouth daily. Takes 1.5 tablets   Yes Historical Provider, MD  zinc gluconate 50 MG tablet Take 50 mg by mouth 3 (three) times daily.    Yes Historical Provider, MD  Positive ROS: All other systems have been reviewed and were otherwise negative with the exception of those mentioned in the HPI and as above.  Physical Exam: General: Alert, no acute distress Cardiovascular: No pedal edema Respiratory: No cyanosis, no use of accessory musculature GI: abdomen soft Skin: No lesions in the area of chief complaint Neurologic: Sensation intact distally Psychiatric: Patient is competent for consent with normal mood and affect Lymphatic: no lymphedema  MUSCULOSKELETAL: exam stable  Assessment: Right knee symptomatic hardware  Plan: Plan for Procedure(s): HARDWARE REMOVAL RIGHT LEG  The risks benefits and alternatives were discussed with the patient including but not limited to the risks of nonoperative treatment, versus surgical intervention including  infection, bleeding, nerve injury,  blood clots, cardiopulmonary complications, morbidity, mortality, among others, and they were willing to proceed.   Marianna Payment, MD   01/21/2015 6:46 PM

## 2015-01-21 NOTE — Progress Notes (Signed)
I was unable to reach patient by phone.  I left  A message on voice mail.  I instructed the patient to arrive at [facility] entrance at 8:00  , nothing to eat or drink after midnight.   I instructed the patient to take the following medications in the am with just enough water to get them down:Keppra, Propanol.  Tylenol if needed I asked patient to not wear any lotions, powders, cologne, jewelry, piercing, make-up or nail polish and to not shave..  I asked the patient to call [phone], in the am if there were any questions or problems.

## 2015-01-22 ENCOUNTER — Encounter (HOSPITAL_COMMUNITY): Payer: Self-pay | Admitting: Certified Registered"

## 2015-01-22 ENCOUNTER — Ambulatory Visit (HOSPITAL_COMMUNITY): Admission: RE | Admit: 2015-01-22 | Payer: Commercial Managed Care - HMO | Source: Ambulatory Visit

## 2015-01-22 ENCOUNTER — Encounter (HOSPITAL_COMMUNITY)
Admission: RE | Disposition: A | Discharge status: Home / Self Care | Payer: Self-pay | Source: Ambulatory Visit | Attending: Orthopaedic Surgery

## 2015-01-22 ENCOUNTER — Ambulatory Visit (HOSPITAL_COMMUNITY): Payer: Commercial Managed Care - HMO | Admitting: Certified Registered"

## 2015-01-22 ENCOUNTER — Ambulatory Visit (HOSPITAL_COMMUNITY)
Admission: RE | Admit: 2015-01-22 | Discharge: 2015-01-22 | Disposition: A | Discharge status: Home / Self Care | Payer: Commercial Managed Care - HMO | Source: Ambulatory Visit | Attending: Orthopaedic Surgery | Admitting: Orthopaedic Surgery

## 2015-01-22 DIAGNOSIS — Z419 Encounter for procedure for purposes other than remedying health state, unspecified: Secondary | ICD-10-CM

## 2015-01-22 DIAGNOSIS — G8918 Other acute postprocedural pain: Secondary | ICD-10-CM | POA: Diagnosis not present

## 2015-01-22 DIAGNOSIS — F17200 Nicotine dependence, unspecified, uncomplicated: Secondary | ICD-10-CM | POA: Insufficient documentation

## 2015-01-22 DIAGNOSIS — Z472 Encounter for removal of internal fixation device: Secondary | ICD-10-CM | POA: Diagnosis not present

## 2015-01-22 DIAGNOSIS — F319 Bipolar disorder, unspecified: Secondary | ICD-10-CM | POA: Insufficient documentation

## 2015-01-22 DIAGNOSIS — M25561 Pain in right knee: Secondary | ICD-10-CM | POA: Diagnosis not present

## 2015-01-22 DIAGNOSIS — R569 Unspecified convulsions: Secondary | ICD-10-CM | POA: Insufficient documentation

## 2015-01-22 DIAGNOSIS — D696 Thrombocytopenia, unspecified: Secondary | ICD-10-CM | POA: Diagnosis not present

## 2015-01-22 DIAGNOSIS — K7460 Unspecified cirrhosis of liver: Secondary | ICD-10-CM | POA: Diagnosis not present

## 2015-01-22 DIAGNOSIS — T849XXA Unspecified complication of internal orthopedic prosthetic device, implant and graft, initial encounter: Secondary | ICD-10-CM | POA: Insufficient documentation

## 2015-01-22 DIAGNOSIS — T84498D Other mechanical complication of other internal orthopedic devices, implants and grafts, subsequent encounter: Secondary | ICD-10-CM | POA: Diagnosis not present

## 2015-01-22 DIAGNOSIS — E785 Hyperlipidemia, unspecified: Secondary | ICD-10-CM | POA: Insufficient documentation

## 2015-01-22 DIAGNOSIS — Z79899 Other long term (current) drug therapy: Secondary | ICD-10-CM | POA: Insufficient documentation

## 2015-01-22 HISTORY — PX: HARDWARE REMOVAL: SHX979

## 2015-01-22 LAB — COMPREHENSIVE METABOLIC PANEL
ALT: 28 U/L (ref 17–63)
AST: 31 U/L (ref 15–41)
Albumin: 2.6 g/dL — ABNORMAL LOW (ref 3.5–5.0)
Alkaline Phosphatase: 113 U/L (ref 38–126)
Anion gap: 4 — ABNORMAL LOW (ref 5–15)
BUN: 13 mg/dL (ref 6–20)
CO2: 25 mmol/L (ref 22–32)
Calcium: 9.1 mg/dL (ref 8.9–10.3)
Chloride: 107 mmol/L (ref 101–111)
Creatinine, Ser: 0.8 mg/dL (ref 0.61–1.24)
GFR calc Af Amer: >60 mL/min (ref >60)
GFR calc non Af Amer: >60 mL/min (ref >60)
Glucose, Bld: 94 mg/dL (ref 65–99)
Potassium: 4.9 mmol/L (ref 3.5–5.1)
Sodium: 136 mmol/L (ref 135–145)
Total Bilirubin: 2.5 mg/dL — ABNORMAL HIGH (ref 0.3–1.2)
Total Protein: 5.5 g/dL — ABNORMAL LOW (ref 6.5–8.1)

## 2015-01-22 LAB — CBC
HCT: 39 % (ref 39.0–52.0)
Hemoglobin: 12.8 g/dL — ABNORMAL LOW (ref 13.0–17.0)
MCH: 32.9 pg (ref 26.0–34.0)
MCHC: 32.8 g/dL (ref 30.0–36.0)
MCV: 100.3 fL — ABNORMAL HIGH (ref 78.0–100.0)
Platelets: 46 10*3/uL — ABNORMAL LOW (ref 150–400)
RBC: 3.89 MIL/uL — ABNORMAL LOW (ref 4.22–5.81)
RDW: 16.1 % — ABNORMAL HIGH (ref 11.5–15.5)
WBC: 4.4 10*3/uL (ref 4.0–10.5)

## 2015-01-22 SURGERY — REMOVAL, HARDWARE
Anesthesia: General | Site: Leg Lower | Laterality: Right

## 2015-01-22 MED ORDER — HYDROMORPHONE HCL 1 MG/ML IJ SOLN: 0.2500 mg | INTRAMUSCULAR | Status: DC | PRN

## 2015-01-22 MED ORDER — CEFAZOLIN SODIUM-DEXTROSE 2-3 GM-% IV SOLR
2.0000 g | INTRAVENOUS | Status: AC
  Administered 2015-01-22: 2 g via INTRAVENOUS
  Filled 2015-01-22: qty 50

## 2015-01-22 MED ORDER — PROPOFOL 10 MG/ML IV BOLUS
INTRAVENOUS | Status: DC | PRN
  Administered 2015-01-22: 150 mg via INTRAVENOUS

## 2015-01-22 MED ORDER — MEPERIDINE HCL 25 MG/ML IJ SOLN: 6.2500 mg | INTRAMUSCULAR | Status: DC | PRN

## 2015-01-22 MED ORDER — SODIUM CHLORIDE 0.9 % IR SOLN
Status: DC | PRN
  Administered 2015-01-22: 1000 mL

## 2015-01-22 MED ORDER — PROMETHAZINE HCL 25 MG/ML IJ SOLN
INTRAMUSCULAR | Status: AC
  Filled 2015-01-22: qty 1

## 2015-01-22 MED ORDER — ONDANSETRON HCL 4 MG/2ML IJ SOLN
INTRAMUSCULAR | Status: AC
  Filled 2015-01-22: qty 2

## 2015-01-22 MED ORDER — FENTANYL CITRATE (PF) 100 MCG/2ML IJ SOLN
50.0000 ug | INTRAMUSCULAR | Status: DC | PRN
  Administered 2015-01-22: 50 ug via INTRAVENOUS
  Filled 2015-01-22: qty 2

## 2015-01-22 MED ORDER — OXYCODONE HCL 5 MG PO TABS: 5.0000 mg | ORAL_TABLET | ORAL | Status: DC | PRN

## 2015-01-22 MED ORDER — MIDAZOLAM HCL 2 MG/2ML IJ SOLN
1.0000 mg | INTRAMUSCULAR | Status: DC | PRN
  Administered 2015-01-22: 2 mg via INTRAVENOUS
  Filled 2015-01-22: qty 2

## 2015-01-22 MED ORDER — KETOROLAC TROMETHAMINE 30 MG/ML IJ SOLN: 30.0000 mg | Freq: Once | INTRAMUSCULAR | Status: DC | PRN

## 2015-01-22 MED ORDER — FENTANYL CITRATE (PF) 250 MCG/5ML IJ SOLN
INTRAMUSCULAR | Status: AC
  Filled 2015-01-22: qty 5

## 2015-01-22 MED ORDER — FENTANYL CITRATE (PF) 100 MCG/2ML IJ SOLN
INTRAMUSCULAR | Status: DC | PRN
  Administered 2015-01-22 (×2): 25 ug via INTRAVENOUS

## 2015-01-22 MED ORDER — MIDAZOLAM HCL 2 MG/2ML IJ SOLN
INTRAMUSCULAR | Status: AC
  Filled 2015-01-22: qty 2

## 2015-01-22 MED ORDER — BUPIVACAINE-EPINEPHRINE (PF) 0.5% -1:200000 IJ SOLN
INTRAMUSCULAR | Status: DC | PRN
  Administered 2015-01-22: 40 mL

## 2015-01-22 MED ORDER — LACTATED RINGERS IV SOLN
INTRAVENOUS | Status: DC
  Administered 2015-01-22 (×2): via INTRAVENOUS

## 2015-01-22 MED ORDER — LIDOCAINE HCL (CARDIAC) 20 MG/ML IV SOLN
INTRAVENOUS | Status: DC | PRN
  Administered 2015-01-22: 80 mg via INTRAVENOUS

## 2015-01-22 MED ORDER — ONDANSETRON HCL 4 MG/2ML IJ SOLN
INTRAMUSCULAR | Status: DC | PRN
  Administered 2015-01-22: 4 mg via INTRAVENOUS

## 2015-01-22 MED ORDER — PROMETHAZINE HCL 25 MG/ML IJ SOLN
6.2500 mg | INTRAMUSCULAR | Status: DC | PRN
  Administered 2015-01-22: 6.25 mg via INTRAVENOUS

## 2015-01-22 SURGICAL SUPPLY — 61 items
BANDAGE ELASTIC 3 VELCRO ST LF (GAUZE/BANDAGES/DRESSINGS) IMPLANT
BANDAGE ELASTIC 6 VELCRO ST LF (GAUZE/BANDAGES/DRESSINGS) ×2 IMPLANT
BLADE SURG 10 STRL SS (BLADE) ×2 IMPLANT
BNDG COHESIVE 1X5 TAN STRL LF (GAUZE/BANDAGES/DRESSINGS) IMPLANT
BNDG COHESIVE 4X5 TAN STRL (GAUZE/BANDAGES/DRESSINGS) ×2 IMPLANT
BNDG COHESIVE 6X5 TAN STRL LF (GAUZE/BANDAGES/DRESSINGS) ×4 IMPLANT
BNDG CONFORM 3 STRL LF (GAUZE/BANDAGES/DRESSINGS) IMPLANT
BNDG GAUZE STRTCH 6 (GAUZE/BANDAGES/DRESSINGS) ×6 IMPLANT
CORDS BIPOLAR (ELECTRODE) IMPLANT
COVER SURGICAL LIGHT HANDLE (MISCELLANEOUS) ×2 IMPLANT
CUFF TOURNIQUET SINGLE 24IN (TOURNIQUET CUFF) IMPLANT
CUFF TOURNIQUET SINGLE 34IN LL (TOURNIQUET CUFF) ×4 IMPLANT
CUFF TOURNIQUET SINGLE 44IN (TOURNIQUET CUFF) IMPLANT
DRAPE EXTREMITY BILATERAL (DRAPE) IMPLANT
DRAPE IMP U-DRAPE 54X76 (DRAPES) IMPLANT
DRAPE INCISE IOBAN 66X45 STRL (DRAPES) ×8 IMPLANT
DRAPE SURG 17X23 STRL (DRAPES) IMPLANT
DRAPE U-SHAPE 47X51 STRL (DRAPES) ×2 IMPLANT
DRSG MEPILEX BORDER 4X4 (GAUZE/BANDAGES/DRESSINGS) ×2 IMPLANT
DRSG MEPILEX BORDER 4X8 (GAUZE/BANDAGES/DRESSINGS) ×2 IMPLANT
DURAPREP 26ML APPLICATOR (WOUND CARE) ×2 IMPLANT
ELECT CAUTERY BLADE 6.4 (BLADE) ×2 IMPLANT
ELECT REM PT RETURN 9FT ADLT (ELECTROSURGICAL)
ELECTRODE REM PT RTRN 9FT ADLT (ELECTROSURGICAL) IMPLANT
FACESHIELD WRAPAROUND (MASK) IMPLANT
GAUZE SPONGE 4X4 12PLY STRL (GAUZE/BANDAGES/DRESSINGS) ×4 IMPLANT
GAUZE XEROFORM 1X8 LF (GAUZE/BANDAGES/DRESSINGS) ×2 IMPLANT
GAUZE XEROFORM 5X9 LF (GAUZE/BANDAGES/DRESSINGS) ×2 IMPLANT
GLOVE NEODERM STRL 7.5 LF PF (GLOVE) ×2 IMPLANT
GLOVE SURG NEODERM 7.5  LF PF (GLOVE) ×2
GOWN STRL REIN XL XLG (GOWN DISPOSABLE) ×4 IMPLANT
HANDPIECE INTERPULSE COAX TIP (DISPOSABLE)
KIT BASIN OR (CUSTOM PROCEDURE TRAY) ×2 IMPLANT
KIT ROOM TURNOVER OR (KITS) ×2 IMPLANT
MANIFOLD NEPTUNE II (INSTRUMENTS) ×2 IMPLANT
NS IRRIG 1000ML POUR BTL (IV SOLUTION) ×4 IMPLANT
PACK ORTHO EXTREMITY (CUSTOM PROCEDURE TRAY) ×2 IMPLANT
PAD ABD 8X10 STRL (GAUZE/BANDAGES/DRESSINGS) ×2 IMPLANT
PAD ARMBOARD 7.5X6 YLW CONV (MISCELLANEOUS) ×4 IMPLANT
PADDING CAST ABS 4INX4YD NS (CAST SUPPLIES) ×2
PADDING CAST ABS COTTON 4X4 ST (CAST SUPPLIES) ×2 IMPLANT
PADDING CAST COTTON 6X4 STRL (CAST SUPPLIES) ×2 IMPLANT
SET HNDPC FAN SPRY TIP SCT (DISPOSABLE) IMPLANT
SPONGE LAP 18X18 X RAY DECT (DISPOSABLE) ×4 IMPLANT
STAPLER VISISTAT 35W (STAPLE) ×2 IMPLANT
STOCKINETTE IMPERVIOUS 9X36 MD (GAUZE/BANDAGES/DRESSINGS) ×2 IMPLANT
SUT ETHILON 2 0 FS 18 (SUTURE) ×6 IMPLANT
SUT ETHILON 2 0 PSLX (SUTURE) ×2 IMPLANT
SUT ETHILON 3 0 PS 1 (SUTURE) ×4 IMPLANT
SUT VIC AB 2-0 CT1 36 (SUTURE) ×2 IMPLANT
SUT VIC AB 2-0 FS1 27 (SUTURE) ×4 IMPLANT
SYR CONTROL 10ML LL (SYRINGE) IMPLANT
TOWEL OR 17X24 6PK STRL BLUE (TOWEL DISPOSABLE) ×2 IMPLANT
TOWEL OR 17X26 10 PK STRL BLUE (TOWEL DISPOSABLE) ×2 IMPLANT
TUBE ANAEROBIC SPECIMEN COL (MISCELLANEOUS) IMPLANT
TUBE CONNECTING 12X1/4 (SUCTIONS) ×2 IMPLANT
TUBE FEEDING 5FR 15 INCH (TUBING) IMPLANT
TUBING CYSTO DISP (UROLOGICAL SUPPLIES) ×2 IMPLANT
UNDERPAD 30X30 INCONTINENT (UNDERPADS AND DIAPERS) ×4 IMPLANT
WATER STERILE IRR 1000ML POUR (IV SOLUTION) ×2 IMPLANT
YANKAUER SUCT BULB TIP NO VENT (SUCTIONS) ×2 IMPLANT

## 2015-01-22 NOTE — Anesthesia Postprocedure Evaluation (Signed)
Anesthesia Post Note  Patient: [name]  Procedure(s) Performed: Procedure(s) (LRB): HARDWARE REMOVAL RIGHT LEG (Right)  Anesthesia type: General + adductor and popliteal blocks  Patient location: PACU  Post pain: Pain level controlled  Post assessment: Post-op Vital signs reviewed  Last Vitals: BP 106/54 mmHg  Pulse 54  Temp(Src) 36.4 C (Oral)  Resp 18  Ht 6' (1.829 m)  Wt 175 lb (79.379 kg)  BMI 23.73 kg/m2  SpO2 96%  Post vital signs: Reviewed  Level of consciousness: sedated  Complications: No apparent anesthesia complications

## 2015-01-22 NOTE — Anesthesia Preprocedure Evaluation (Addendum)
Anesthesia Evaluation  Patient identified by MRN, date of birth, ID band Patient awake    Reviewed: Allergy & Precautions, H&P , NPO status , Patient's Chart, lab work & pertinent test results  Airway Mallampati: II  TM Distance: >3 FB Neck ROM: full    Dental no notable dental hx.    Pulmonary Current Smoker,  breath sounds clear to auscultation  Pulmonary exam normal       Cardiovascular Normal cardiovascular examRhythm:Regular Rate:Normal     Neuro/Psych Seizures -,  Depression Bipolar Disorder    GI/Hepatic (+)     substance abuse  alcohol use, Hepatitis -Wilson's disease   Endo/Other    Renal/GU      Musculoskeletal   Abdominal   Peds  Hematology   Anesthesia Other Findings   Reproductive/Obstetrics                           Anesthesia Physical  Anesthesia Plan  ASA: III  Anesthesia Plan: General   Post-op Pain Management:    Induction: Intravenous  Airway Management Planned: Oral ETT  Additional Equipment: None  Intra-op Plan:   Post-operative Plan: Extubation in OR  Informed Consent: I have reviewed the patients History and Physical, chart, labs and discussed the procedure including the risks, benefits and alternatives for the proposed anesthesia with the patient or authorized representative who has indicated his/her understanding and acceptance.   Dental advisory given  Plan Discussed with: CRNA  Anesthesia Plan Comments:         Anesthesia Quick Evaluation

## 2015-01-22 NOTE — Op Note (Signed)
Date of surgery: 01/22/2015  Preoperative diagnosis: Symptomatic right leg hardware  Preoperative diagnosis: Same  Procedure: Removal of deep implant from right leg  Surgeon: Eduard Roux, M.D.  Anesthesia: Gen.  Estimated blood loss: Minimal  Complications: None  Condition to PACU: Stable  Indications for procedure: The patient is a 49 year old gentleman who has symptomatically hardware of his right leg from previous ORIF of his tibial plateau fracture. He had failed conservative treatment and he wished to proceed with removal of the hardware. He was aware the risks benefits alternatives to surgery.  Description of procedure: Patient was identified in the preoperative holding area. The operative site was marked by the surgeon confirmed with the patient. He is brought back to the operating room. His placed supine on table. General anesthesia was induced. Nonsterile tourniquet was placed on the upper right thigh. The right lower stem and was prepped and draped in standard sterile fashion. Timeout was performed.  Preoperative antibiotics were given. The extremity was exsanguinated using Esmarch bandage. The tourniquet was inflated to 300 mmHg. We used the previous incisions to expose the plate. Full-thickness flaps were created. The proximal portion of the plate was first exposed. The 3 rafting screws were taken out sequentially. We then took out the 2 raftering and shaft screws proximally. We then made a separate incision distally to take at the 3 distal nonlocking screws. A Cobb was then slid between the bone and the plate in order to free the plate up. The plate was then pulled out from the proximal wound. The wounds were then thoroughly irrigated and closed in layer fashion using 0 Vicryl for the fascia, 2-0 Vicryl for the subcutaneous cutaneous layer, staples for the skin. Sterile dressings were applied. Tourniquet was deflated. Patient was x-rayed and transferred to the PACU in stable  condition  Disposition: Patient will be weight bear as tolerated to the right lower extremity. We will see him back in the office in 2 weeks for suture removal

## 2015-01-22 NOTE — Discharge Instructions (Signed)
Keep the dressings on until follow up.

## 2015-01-22 NOTE — Anesthesia Procedure Notes (Addendum)
Procedure Name: LMA Insertion Date/Time: 01/22/2015 11:29 AM Performed by: Julian Reil Pre-anesthesia Checklist: Patient identified, Emergency Drugs available, Suction available and Patient being monitored Patient Re-evaluated:Patient Re-evaluated prior to inductionOxygen Delivery Method: Circle system utilized Preoxygenation: Pre-oxygenation with 100% oxygen Intubation Type: IV induction Ventilation: Mask ventilation without difficulty LMA: LMA inserted LMA Size: 4.0 Tube type: Oral Number of attempts: 1 Placement Confirmation: positive ETCO2 and breath sounds checked- equal and bilateral Tube secured with: Tape Dental Injury: Teeth and Oropharynx as per pre-operative assessment    Anesthesia Regional Block:  Adductor canal block  Pre-Anesthetic Checklist: ,, timeout performed, Correct Patient, Correct Site, Correct Laterality, Correct Procedure, Correct Position, site marked, Risks and benefits discussed, Surgical consent,  Pre-op evaluation,  Post-op pain management  Laterality: Right  Prep: chloraprep       Needles:  Injection technique: Single-shot  Needle Type: Stimiplex     Needle Length: 9cm 9 cm Needle Gauge: 21 and 21 G    Additional Needles:  Procedures: ultrasound guided (picture in chart) Adductor canal block Narrative:  Injection made incrementally with aspirations every 5 mL.  Performed by: Personally  Anesthesiologist: Nolon Nations  Additional Notes: BP cuff, EKG monitors applied. Sedation begun. Artery and nerve location verified with U/S and anesthetic injected incrementally, slowly, and after negative aspirations under direct u/s guidance. Good fascial /perineural spread. Tolerated well.   Anesthesia Regional Block:  Popliteal block  Pre-Anesthetic Checklist: ,, timeout performed, Correct Patient, Correct Site, Correct Laterality, Correct Procedure, Correct Position, site marked, Risks and benefits discussed, Surgical consent,  Pre-op  evaluation,  Post-op pain management  Laterality: Right  Prep: chloraprep       Needles:  Injection technique: Single-shot  Needle Type: Stimiplex     Needle Length: 10cm 10 cm Needle Gauge: 21 and 21 G    Additional Needles:  Procedures: ultrasound guided (picture in chart)  Motor weakness within 5 minutes. Popliteal block Narrative:  Injection made incrementally with aspirations every 5 mL.  Performed by: Personally  Anesthesiologist: Nolon Nations  Additional Notes: Nerve located and needle positioned with direct ultrasound guidance. Good perineural spread. Patient tolerated well.

## 2015-01-22 NOTE — Transfer of Care (Signed)
Immediate Anesthesia Transfer of Care Note  Patient: [name]  Procedure(s) Performed: Procedure(s): HARDWARE REMOVAL RIGHT LEG (Right)  Patient Location: PACU  Anesthesia Type:GA combined with regional for post-op pain  Level of Consciousness: awake, alert , oriented and patient cooperative  Airway & Oxygen Therapy: Patient Spontanous Breathing and Patient connected to nasal cannula oxygen  Post-op Assessment: Report given to RN, Post -op Vital signs reviewed and stable and Patient moving all extremities  Post vital signs: Reviewed and stable  Last Vitals:  Filed Vitals:   01/22/15 1050  BP: 113/52  Pulse: 57  Temp:   Resp: 21    Complications: No apparent anesthesia complications

## 2015-01-22 NOTE — H&P (Signed)

## 2015-01-26 ENCOUNTER — Encounter (HOSPITAL_COMMUNITY): Payer: Self-pay | Admitting: Orthopaedic Surgery

## 2015-02-02 DIAGNOSIS — R188 Other ascites: Secondary | ICD-10-CM | POA: Diagnosis not present

## 2015-02-02 DIAGNOSIS — K7460 Unspecified cirrhosis of liver: Secondary | ICD-10-CM | POA: Diagnosis not present

## 2015-02-02 DIAGNOSIS — R161 Splenomegaly, not elsewhere classified: Secondary | ICD-10-CM | POA: Diagnosis not present

## 2015-02-02 DIAGNOSIS — K8020 Calculus of gallbladder without cholecystitis without obstruction: Secondary | ICD-10-CM | POA: Diagnosis not present

## 2015-02-03 HISTORY — PX: OTHER SURGICAL HISTORY: SHX169

## 2015-02-05 DIAGNOSIS — M25561 Pain in right knee: Secondary | ICD-10-CM | POA: Diagnosis not present

## 2015-02-15 DIAGNOSIS — K7460 Unspecified cirrhosis of liver: Secondary | ICD-10-CM | POA: Diagnosis not present

## 2015-02-15 DIAGNOSIS — Z885 Allergy status to narcotic agent status: Secondary | ICD-10-CM | POA: Diagnosis not present

## 2015-02-15 DIAGNOSIS — G40909 Epilepsy, unspecified, not intractable, without status epilepticus: Secondary | ICD-10-CM | POA: Diagnosis not present

## 2015-02-15 DIAGNOSIS — C220 Liver cell carcinoma: Secondary | ICD-10-CM | POA: Diagnosis not present

## 2015-02-16 ENCOUNTER — Encounter: Payer: Self-pay | Admitting: Internal Medicine

## 2015-02-16 NOTE — Telephone Encounter (Signed)
PCC's to see above 

## 2015-02-24 DIAGNOSIS — K766 Portal hypertension: Secondary | ICD-10-CM | POA: Diagnosis not present

## 2015-02-24 DIAGNOSIS — R188 Other ascites: Secondary | ICD-10-CM | POA: Diagnosis not present

## 2015-02-24 DIAGNOSIS — F17200 Nicotine dependence, unspecified, uncomplicated: Secondary | ICD-10-CM | POA: Diagnosis not present

## 2015-02-24 DIAGNOSIS — Z885 Allergy status to narcotic agent status: Secondary | ICD-10-CM | POA: Diagnosis not present

## 2015-02-24 DIAGNOSIS — C220 Liver cell carcinoma: Secondary | ICD-10-CM | POA: Diagnosis not present

## 2015-02-24 DIAGNOSIS — L905 Scar conditions and fibrosis of skin: Secondary | ICD-10-CM | POA: Diagnosis not present

## 2015-02-24 DIAGNOSIS — F1010 Alcohol abuse, uncomplicated: Secondary | ICD-10-CM | POA: Diagnosis not present

## 2015-02-24 DIAGNOSIS — K7469 Other cirrhosis of liver: Secondary | ICD-10-CM | POA: Diagnosis not present

## 2015-02-24 DIAGNOSIS — G9340 Encephalopathy, unspecified: Secondary | ICD-10-CM | POA: Diagnosis not present

## 2015-03-29 DIAGNOSIS — N62 Hypertrophy of breast: Secondary | ICD-10-CM | POA: Diagnosis not present

## 2015-03-29 DIAGNOSIS — R161 Splenomegaly, not elsewhere classified: Secondary | ICD-10-CM | POA: Diagnosis not present

## 2015-03-29 DIAGNOSIS — K766 Portal hypertension: Secondary | ICD-10-CM | POA: Diagnosis not present

## 2015-03-29 DIAGNOSIS — Z79899 Other long term (current) drug therapy: Secondary | ICD-10-CM | POA: Diagnosis not present

## 2015-03-29 DIAGNOSIS — K8020 Calculus of gallbladder without cholecystitis without obstruction: Secondary | ICD-10-CM | POA: Diagnosis not present

## 2015-03-29 DIAGNOSIS — C220 Liver cell carcinoma: Secondary | ICD-10-CM | POA: Diagnosis not present

## 2015-04-28 ENCOUNTER — Ambulatory Visit: Payer: Self-pay | Admitting: Internal Medicine

## 2015-04-30 ENCOUNTER — Ambulatory Visit (INDEPENDENT_AMBULATORY_CARE_PROVIDER_SITE_OTHER)
Admission: RE | Admit: 2015-04-30 | Discharge: 2015-04-30 | Disposition: A | Discharge status: Home / Self Care | Payer: Commercial Managed Care - HMO | Source: Ambulatory Visit | Attending: Internal Medicine | Admitting: Internal Medicine

## 2015-04-30 ENCOUNTER — Ambulatory Visit (INDEPENDENT_AMBULATORY_CARE_PROVIDER_SITE_OTHER): Payer: Commercial Managed Care - HMO | Admitting: Internal Medicine

## 2015-04-30 ENCOUNTER — Other Ambulatory Visit (INDEPENDENT_AMBULATORY_CARE_PROVIDER_SITE_OTHER): Payer: Commercial Managed Care - HMO

## 2015-04-30 ENCOUNTER — Encounter: Payer: Self-pay | Admitting: Internal Medicine

## 2015-04-30 VITALS — BP 102/60 | HR 53 | Temp 97.6°F | Ht 72.0 in | Wt 168.0 lb

## 2015-04-30 DIAGNOSIS — Z0000 Encounter for general adult medical examination without abnormal findings: Secondary | ICD-10-CM

## 2015-04-30 DIAGNOSIS — R079 Chest pain, unspecified: Secondary | ICD-10-CM

## 2015-04-30 DIAGNOSIS — S2232XA Fracture of one rib, left side, initial encounter for closed fracture: Secondary | ICD-10-CM | POA: Diagnosis not present

## 2015-04-30 LAB — CBC WITH DIFFERENTIAL/PLATELET
Basophils Absolute: 0 10*3/uL (ref 0.0–0.1)
Basophils Relative: 0.5 % (ref 0.0–3.0)
Eosinophils Absolute: 0.1 10*3/uL (ref 0.0–0.7)
Eosinophils Relative: 1.8 % (ref 0.0–5.0)
HCT: 40.4 % (ref 39.0–52.0)
Hemoglobin: 13.7 g/dL (ref 13.0–17.0)
Lymphocytes Relative: 22 % (ref 12.0–46.0)
Lymphs Abs: 1.3 10*3/uL (ref 0.7–4.0)
MCHC: 34 g/dL (ref 30.0–36.0)
MCV: 101.2 fl — ABNORMAL HIGH (ref 78.0–100.0)
Monocytes Absolute: 0.7 10*3/uL (ref 0.1–1.0)
Monocytes Relative: 11.9 % (ref 3.0–12.0)
Neutro Abs: 3.9 10*3/uL (ref 1.4–7.7)
Neutrophils Relative %: 63.8 % (ref 43.0–77.0)
Platelets: 59 10*3/uL — ABNORMAL LOW (ref 150.0–400.0)
RBC: 3.99 Mil/uL — ABNORMAL LOW (ref 4.22–5.81)
RDW: 16.5 % — ABNORMAL HIGH (ref 11.5–15.5)
WBC: 6.1 10*3/uL (ref 4.0–10.5)

## 2015-04-30 LAB — BASIC METABOLIC PANEL
BUN: 12 mg/dL (ref 6–23)
CO2: 28 mEq/L (ref 19–32)
Calcium: 9.3 mg/dL (ref 8.4–10.5)
Chloride: 104 mEq/L (ref 96–112)
Creatinine, Ser: 0.84 mg/dL (ref 0.40–1.50)
GFR: 102.38 mL/min (ref >60.00)
Glucose, Bld: 80 mg/dL (ref 70–99)
Potassium: 3.6 mEq/L (ref 3.5–5.1)
Sodium: 139 mEq/L (ref 135–145)

## 2015-04-30 LAB — PSA: PSA: 0.1 ng/mL (ref 0.10–4.00)

## 2015-04-30 LAB — URINALYSIS, ROUTINE W REFLEX MICROSCOPIC
Bilirubin Urine: NEGATIVE
Hgb urine dipstick: NEGATIVE
Ketones, ur: NEGATIVE
Leukocytes, UA: NEGATIVE
Nitrite: NEGATIVE
Specific Gravity, Urine: 1.02 (ref 1.000–1.030)
Total Protein, Urine: NEGATIVE
Urine Glucose: NEGATIVE
Urobilinogen, UA: 4 — AB (ref 0.0–1.0)
pH: 6 (ref 5.0–8.0)

## 2015-04-30 LAB — LIPID PANEL
Cholesterol: 147 mg/dL (ref 0–200)
HDL: 45.7 mg/dL (ref >39.00)
LDL Cholesterol: 80 mg/dL (ref 0–99)
NonHDL: 101.02
Total CHOL/HDL Ratio: 3
Triglycerides: 107 mg/dL (ref 0.0–149.0)
VLDL: 21.4 mg/dL (ref 0.0–40.0)

## 2015-04-30 LAB — HEPATIC FUNCTION PANEL
ALT: 25 U/L (ref 0–53)
AST: 26 U/L (ref 0–37)
Albumin: 3.3 g/dL — ABNORMAL LOW (ref 3.5–5.2)
Alkaline Phosphatase: 123 U/L — ABNORMAL HIGH (ref 39–117)
Bilirubin, Direct: 0.7 mg/dL — ABNORMAL HIGH (ref 0.0–0.3)
Total Bilirubin: 2.8 mg/dL — ABNORMAL HIGH (ref 0.2–1.2)
Total Protein: 6.1 g/dL (ref 6.0–8.3)

## 2015-04-30 LAB — TSH: TSH: 1.41 u[IU]/mL (ref 0.35–4.50)

## 2015-04-30 MED ORDER — OXYCODONE HCL 5 MG PO TABS: 5.0000 mg | ORAL_TABLET | ORAL | Status: DC | PRN

## 2015-04-30 NOTE — Assessment & Plan Note (Signed)

## 2015-04-30 NOTE — Progress Notes (Signed)
Subjective:    Patient ID: [name], male    DOB: [birth year], 49 y.o.   MRN: [id number]  HPI  Here for wellness and f/u;  Overall doing ok;  Pt denies Chest pain, worsening SOB, DOE, wheezing, orthopnea, PND, worsening LE edema, palpitations, dizziness or syncope.  Pt denies neurological change such as new headache, facial or extremity weakness.  Pt denies polydipsia, polyuria, or low sugar symptoms. Pt states overall good compliance with treatment and medications, good tolerability, and has been trying to follow appropriate diet.  Pt denies worsening depressive symptoms, suicidal ideation or panic. No fever, night sweats, wt loss, loss of appetite, or other constitutional symptoms.  Pt states good ability with ADL's, has low fall risk, home safety reviewed and adequate, no other significant changes in hearing or vision, and only occasionally active with exercise.  S/p liver ca mass ablation procedure.  Lost several lbs.   Wt Readings from Last 3 Encounters:  04/30/15 168 lb (76.204 kg)  01/22/15 175 lb (79.379 kg)  10/22/14 174 lb (78.926 kg)  no recent siezure.  Did have a fall with slip in tub, fell to left side on ledge of tub, now with mod to severe pain, worse to breathe, hard to sleep, nothing seems to make better.   Past Medical History  Diagnosis Date  . Hernia   . Bipolar affective disorder     [place]  . Depression   . Seizures     Dr Jannifer Franklin  . Abuse, drug or alcohol   . Wilson disease   . Encephalopathy   . Thrombocytopenia   . Chronic liver disease and cirrhosis   . History of alcohol abuse   . Dyslipidemia    Past Surgical History  Procedure Laterality Date  . Left inguinal hernia  june 2012    [place]  . Vascular surgery    . Open reduction internal fixation (orif) tibia/fibula fracture Right 07/25/2013    Procedure: OPEN REDUCTION INTERNAL FIXATION (ORIF) RIGHT TIBIAL PLATEAU, TIBIAL SHAFT AND FIBULA FRACTURES, POSSIBLE  FASCIOTOMIES;  Surgeon: Marianna Payment, MD;  Location: [place];  Service: Orthopedics;  Laterality: Right;  . Hardware removal Right 01/22/2015    Procedure: HARDWARE REMOVAL RIGHT LEG;  Surgeon: Leandrew Koyanagi, MD;  Location: [place];  Service: Orthopedics;  Laterality: Right;    reports that he has been smoking.  He has never used smokeless tobacco. He reports that he does not drink alcohol or use illicit drugs. family history includes Dementia in his mother; Diabetes in his father; Glaucoma in his brother; Heart disease in his other. Allergies  Allergen Reactions  . Codeine Nausea And Vomiting   Review of Systems Constitutional: Negative for increased diaphoresis, other activity, appetite or siginficant weight change other than noted HENT: Negative for worsening hearing loss, ear pain, facial swelling, mouth sores and neck stiffness.   Eyes: Negative for other worsening pain, redness or visual disturbance.  Respiratory: Negative for shortness of breath and wheezing  Cardiovascular: Negative for chest pain and palpitations.  Gastrointestinal: Negative for diarrhea, blood in stool, abdominal distention or other pain Genitourinary: Negative for hematuria, flank pain or change in urine volume.  Musculoskeletal: Negative for myalgias or other joint complaints.  Skin: Negative for color change and wound or drainage.  Neurological: Negative for syncope and numbness. other than noted Hematological: Negative for adenopathy. or other swelling Psychiatric/Behavioral: Negative for hallucinations, SI, self-injury, decreased concentration or other worsening agitation.  Objective:   Physical Exam BP 102/60 mmHg  Pulse 53  Temp(Src) 97.6 F (36.4 C) (Oral)  Ht 6' (1.829 m)  Wt 168 lb (76.204 kg)  BMI 22.78 kg/m2  SpO2 98%  Walks with cane VS noted,  Constitutional: Pt is oriented to person, place, and time. Appears well-developed and well-nourished, in no significant distress Head:  Normocephalic and atraumatic.  Right Ear: External ear normal.  Left Ear: External ear normal.  Nose: Nose normal.  Mouth/Throat: Oropharynx is clear and moist.  Eyes: Conjunctivae and EOM are normal. Pupils are equal, round, and reactive to light.  Neck: Normal range of motion. Neck supple. No JVD present. No tracheal deviation present or significant neck LA or mass Cardiovascular: Normal rate, regular rhythm, normal heart sounds and intact distal pulses.   Pulmonary/Chest: Effort normal and breath sounds without rales or wheezing  Abdominal: Soft. Bowel sounds are normal. NT. No HSM  Musculoskeletal: Normal range of motion. Exhibits no edema. Marked tender about t8-t11 left lateral rib cage without bruising, but mild diffuse swelling and marked tender noted Lymphadenopathy:  Has no cervical adenopathy.  Neurological: Pt is alert and oriented to person, place, and time. Pt has normal reflexes. No cranial nerve deficit. Motor grossly intact Skin: Skin is warm and dry. No rash noted.  Psychiatric:  Has normal mood and affect. Behavior is normal.     Assessment & Plan:

## 2015-04-30 NOTE — Patient Instructions (Signed)
Please continue all other medications as before, and refills have been done if requested.  Please have the pharmacy call with any other refills you may need.  Please continue your efforts at being more active, low cholesterol diet, and weight control.  You are otherwise up to date with prevention measures today.  Please keep your appointments with your specialists as you may have planned  Please go to the XRAY Department in the Basement (go straight as you get off the elevator) for the x-ray testing  Please go to the LAB in the Basement (turn left off the elevator) for the tests to be done today  You will be contacted by phone if any changes need to be made immediately.  Otherwise, you will receive a letter about your results with an explanation, but please check with MyChart first.  Please remember to sign up for MyChart if you have not done so, as this will be important to you in the future with finding out test results, communicating by private email, and scheduling acute appointments online when needed.  Please return in 6 months, or sooner if needed 

## 2015-04-30 NOTE — Addendum Note (Signed)
Addended by: Biagio Borg on: 04/30/2015 03:48 PM   Modules accepted: Orders

## 2015-04-30 NOTE — Progress Notes (Signed)
Pre visit review using our clinic review tool, if applicable. No additional management support is needed unless otherwise documented below in the visit note. 

## 2015-05-27 DIAGNOSIS — F1011 Alcohol abuse, in remission: Secondary | ICD-10-CM | POA: Insufficient documentation

## 2015-05-27 DIAGNOSIS — G939 Disorder of brain, unspecified: Secondary | ICD-10-CM | POA: Insufficient documentation

## 2015-05-27 DIAGNOSIS — R569 Unspecified convulsions: Secondary | ICD-10-CM

## 2015-05-27 DIAGNOSIS — F319 Bipolar disorder, unspecified: Secondary | ICD-10-CM | POA: Insufficient documentation

## 2015-05-27 DIAGNOSIS — F32A Depression, unspecified: Secondary | ICD-10-CM | POA: Insufficient documentation

## 2015-05-27 DIAGNOSIS — D696 Thrombocytopenia, unspecified: Secondary | ICD-10-CM | POA: Insufficient documentation

## 2015-05-31 DIAGNOSIS — C220 Liver cell carcinoma: Secondary | ICD-10-CM | POA: Diagnosis not present

## 2015-05-31 DIAGNOSIS — K7469 Other cirrhosis of liver: Secondary | ICD-10-CM | POA: Diagnosis not present

## 2015-05-31 DIAGNOSIS — Z1211 Encounter for screening for malignant neoplasm of colon: Secondary | ICD-10-CM | POA: Diagnosis not present

## 2015-06-15 DIAGNOSIS — T84498D Other mechanical complication of other internal orthopedic devices, implants and grafts, subsequent encounter: Secondary | ICD-10-CM | POA: Diagnosis not present

## 2015-06-15 DIAGNOSIS — M25561 Pain in right knee: Secondary | ICD-10-CM | POA: Diagnosis not present

## 2015-06-15 DIAGNOSIS — M1731 Unilateral post-traumatic osteoarthritis, right knee: Secondary | ICD-10-CM | POA: Diagnosis not present

## 2015-06-24 DIAGNOSIS — C220 Liver cell carcinoma: Secondary | ICD-10-CM | POA: Diagnosis not present

## 2015-06-24 DIAGNOSIS — I868 Varicose veins of other specified sites: Secondary | ICD-10-CM | POA: Diagnosis not present

## 2015-06-24 DIAGNOSIS — K7460 Unspecified cirrhosis of liver: Secondary | ICD-10-CM | POA: Diagnosis not present

## 2015-06-24 DIAGNOSIS — R188 Other ascites: Secondary | ICD-10-CM | POA: Diagnosis not present

## 2015-06-24 DIAGNOSIS — I864 Gastric varices: Secondary | ICD-10-CM | POA: Diagnosis not present

## 2015-06-24 DIAGNOSIS — R161 Splenomegaly, not elsewhere classified: Secondary | ICD-10-CM | POA: Diagnosis not present

## 2015-06-24 DIAGNOSIS — K766 Portal hypertension: Secondary | ICD-10-CM | POA: Diagnosis not present

## 2015-06-28 ENCOUNTER — Other Ambulatory Visit: Payer: Self-pay | Admitting: Internal Medicine

## 2015-06-28 ENCOUNTER — Other Ambulatory Visit (HOSPITAL_COMMUNITY): Payer: Self-pay | Admitting: Orthopaedic Surgery

## 2015-07-07 ENCOUNTER — Encounter (HOSPITAL_COMMUNITY): Payer: Self-pay | Admitting: *Deleted

## 2015-07-07 ENCOUNTER — Encounter (HOSPITAL_COMMUNITY): Payer: Self-pay

## 2015-07-07 ENCOUNTER — Encounter (HOSPITAL_COMMUNITY)
Admission: RE | Admit: 2015-07-07 | Discharge: 2015-07-07 | Disposition: A | Discharge status: Home / Self Care | Payer: Commercial Managed Care - HMO | Source: Ambulatory Visit | Attending: Orthopaedic Surgery | Admitting: Orthopaedic Surgery

## 2015-07-07 DIAGNOSIS — F17200 Nicotine dependence, unspecified, uncomplicated: Secondary | ICD-10-CM | POA: Diagnosis not present

## 2015-07-07 DIAGNOSIS — Z01818 Encounter for other preprocedural examination: Secondary | ICD-10-CM | POA: Insufficient documentation

## 2015-07-07 DIAGNOSIS — Z01812 Encounter for preprocedural laboratory examination: Secondary | ICD-10-CM | POA: Insufficient documentation

## 2015-07-07 DIAGNOSIS — Z8505 Personal history of malignant neoplasm of liver: Secondary | ICD-10-CM | POA: Diagnosis not present

## 2015-07-07 DIAGNOSIS — K7460 Unspecified cirrhosis of liver: Secondary | ICD-10-CM | POA: Insufficient documentation

## 2015-07-07 DIAGNOSIS — Z0183 Encounter for blood typing: Secondary | ICD-10-CM | POA: Insufficient documentation

## 2015-07-07 DIAGNOSIS — M179 Osteoarthritis of knee, unspecified: Secondary | ICD-10-CM | POA: Insufficient documentation

## 2015-07-07 DIAGNOSIS — D696 Thrombocytopenia, unspecified: Secondary | ICD-10-CM | POA: Insufficient documentation

## 2015-07-07 DIAGNOSIS — Z79899 Other long term (current) drug therapy: Secondary | ICD-10-CM | POA: Diagnosis not present

## 2015-07-07 DIAGNOSIS — R791 Abnormal coagulation profile: Secondary | ICD-10-CM | POA: Diagnosis not present

## 2015-07-07 HISTORY — DX: Esophageal varices without bleeding: I85.00

## 2015-07-07 LAB — COMPREHENSIVE METABOLIC PANEL
ALT: 26 U/L (ref 17–63)
AST: 36 U/L (ref 15–41)
Albumin: 2.9 g/dL — ABNORMAL LOW (ref 3.5–5.0)
Alkaline Phosphatase: 129 U/L — ABNORMAL HIGH (ref 38–126)
Anion gap: 7 (ref 5–15)
BUN: 9 mg/dL (ref 6–20)
CO2: 23 mmol/L (ref 22–32)
Calcium: 9.1 mg/dL (ref 8.9–10.3)
Chloride: 108 mmol/L (ref 101–111)
Creatinine, Ser: 0.85 mg/dL (ref 0.61–1.24)
GFR calc Af Amer: >60 mL/min (ref >60)
GFR calc non Af Amer: >60 mL/min (ref >60)
Glucose, Bld: 93 mg/dL (ref 65–99)
Potassium: 3.9 mmol/L (ref 3.5–5.1)
Sodium: 138 mmol/L (ref 135–145)
Total Bilirubin: 2.5 mg/dL — ABNORMAL HIGH (ref 0.3–1.2)
Total Protein: 5.8 g/dL — ABNORMAL LOW (ref 6.5–8.1)

## 2015-07-07 LAB — CBC WITH DIFFERENTIAL/PLATELET
Basophils Absolute: 0 10*3/uL (ref 0.0–0.1)
Basophils Relative: 1 %
Eosinophils Absolute: 0.1 10*3/uL (ref 0.0–0.7)
Eosinophils Relative: 2 %
HCT: 40.4 % (ref 39.0–52.0)
Hemoglobin: 13.3 g/dL (ref 13.0–17.0)
Lymphocytes Relative: 23 %
Lymphs Abs: 1.3 10*3/uL (ref 0.7–4.0)
MCH: 33.6 pg (ref 26.0–34.0)
MCHC: 32.9 g/dL (ref 30.0–36.0)
MCV: 102 fL — ABNORMAL HIGH (ref 78.0–100.0)
Monocytes Absolute: 0.7 10*3/uL (ref 0.1–1.0)
Monocytes Relative: 13 %
Neutro Abs: 3.4 10*3/uL (ref 1.7–7.7)
Neutrophils Relative %: 61 %
Platelets: DECREASED 10*3/uL (ref 150–400)
RBC: 3.96 MIL/uL — ABNORMAL LOW (ref 4.22–5.81)
RDW: 15.4 % (ref 11.5–15.5)
WBC: 5.5 10*3/uL (ref 4.0–10.5)

## 2015-07-07 LAB — URINALYSIS, ROUTINE W REFLEX MICROSCOPIC
Bilirubin Urine: NEGATIVE
Glucose, UA: NEGATIVE mg/dL
Hgb urine dipstick: NEGATIVE
Ketones, ur: NEGATIVE mg/dL
Leukocytes, UA: NEGATIVE
Nitrite: NEGATIVE
Protein, ur: NEGATIVE mg/dL
Specific Gravity, Urine: 1.01 (ref 1.005–1.030)
Urobilinogen, UA: 1 mg/dL (ref 0.0–1.0)
pH: 5 (ref 5.0–8.0)

## 2015-07-07 LAB — SURGICAL PCR SCREEN
MRSA, PCR: NEGATIVE
Staphylococcus aureus: NEGATIVE

## 2015-07-07 LAB — PROTIME-INR
INR: 1.49 (ref 0.00–1.49)
Prothrombin Time: 18.1 seconds — ABNORMAL HIGH (ref 11.6–15.2)

## 2015-07-07 LAB — APTT: aPTT: 35 seconds (ref 24–37)

## 2015-07-07 LAB — SEDIMENTATION RATE: Sed Rate: 5 mm/hr (ref 0–16)

## 2015-07-07 LAB — C-REACTIVE PROTEIN: CRP: 0.9 mg/dL (ref <1.0)

## 2015-07-07 NOTE — Progress Notes (Signed)
Pt denies SOB, chest pain, and being under the care of a cardiologist. Pt denies having an echo and cardiac cath but stated that a stress test was done but cannot remember when, where and who completed the study. Pt stated that " everything was fine with the stress test."  Pt chart forwarded to [place], [place], anesthesia, to review abnormal labs ( PT 18.1) and medical clearance note on chart.

## 2015-07-07 NOTE — Pre-Procedure Instructions (Signed)
    [name]  07/07/2015      [facility] [name], [name] AT [address] [place] [address] Phone: [phone] Fax: [place] [phone] - [place], [place] - [address] [facility] OF [address] [place] [address] Phone: [phone] Fax: [phone]    Your procedure is scheduled on Thursday, July 15, 2015  Report to [facility] Admitting at 10:15 A.M.  Call this number if you have problems the morning of surgery:  [phone]   Remember:  Do not eat food or drink liquids after midnight Wednesday, July 14, 2015  Take these medicines the morning of surgery with A SIP OF WATER : levETIRAcetam (KEPPRA),  propranolol (INDERAL), if needed: oxyCODONE (OXY IR/ROXICODONE)  for severe pain,  OR acetaminophen (TYLENOL) for pain Stop taking Aspirin, vitamins and herbal medications such as Milk Thistle. Do not take any NSAIDs ie: Ibuprofen, Advil, Naproxen or any medication containing Aspirin; stop Thursday, July 15, 2015.  Do not wear jewelry, make-up or nail polish.  Do not wear lotions, powders, or perfumes.  You may not wear deodorant.  Do not shave 48 hours prior to surgery.    Do not bring valuables to the hospital.  [facility] is not responsible for any belongings or valuables.  Contacts, dentures or bridgework may not be worn into surgery.  Leave your suitcase in the car.  After surgery it may be brought to your room.  For patients admitted to the hospital, discharge time will be determined by your treatment team.  Patients discharged the day of surgery will not be allowed to drive home.   Name and phone number of your driver:    Special instructions: Shower the night before surgery and the morning of surgery with CHG.  Please read over the following fact sheets that you were given. Pain Booklet, Coughing and Deep  Breathing, Blood Transfusion Information, Total Joint Packet, MRSA Information and Surgical Site Infection Prevention

## 2015-07-08 NOTE — Progress Notes (Addendum)
Anesthesia Chart Review:  Pt is 49 year old male scheduled for R total knee arthroplasty on 07/15/2015 with Dr. Erlinda Hong.   PCP is Dr. Cathlean Cower who has cleared pt medically for surgery.   PMH includes: bipolar affective disorder, seizures, alcohol abuse (reportedly none since 2005), encephalopathy, chronic liver cirrhosis, liver cancer, thrombocytopenia, esophageal varices, Wilson's disease. Current smoker. BMI 23. S/p R leg hardware removal 01/22/15. S/p ORIF R tibial plateau, tibia and fibula fractures 07/25/13.   Medications include: lasix, lactulose, keppra, potassium, propranolol, spironolactone, zinc  Preoperative labs reviewed.  Platelets clumped, but count appears low. Prior results ([place] and care everywhere) indicate pt's platelet count low, most recently was 43 on 05/31/15. PT 18.1. Prior results ( and care everywhere) indicate PT is usually high, most recently was 16.4 on 05/31/15.   Chest x-ray 04/30/15 reviewed. No active cardiopulmonary disease  EKG 01/22/15: sinus bradycardia (49 bpm).   Liver disease is followed by Dr. Rayvon Char (care everywhere), last office visit 05/31/15.   Reviewed case with Dr. Oletta Lamas. Pt will need GI clearance for surgery given thrombocytopenia and elevated PT. Left voicemail for Sherrie in Dr. Phoebe Sharps office with lab results and need for clearance.   Willeen Cass, FNP-BC [facility] Short Stay Surgical Center/Anesthesiology Phone: [phone] 07/08/2015 1:46 PM  Addendum: Received GI clearance from Dr. Rayvon Char. Notes states, "Patient is at low risk for liver decompensation and death within 30 d from an orthopedic surgery. He is at increased risk of bleeding complications with platelets 40-50,000 and INR of 1.47. Likely will need platelets +/- FFP. Would avoid large amounts of normal saline if possible in favor of more oncotic volume expander-(blood product, FFP, albumin). He has no esophageal varices if ______ Lyda Perone ?] is needed for short  time."  Note place on paper chart.   Last CBC showed PLT clumps on 07/07/15. Will get a PLT count on the DOS. T&S already done. Defer decision for PLT infusion, FFP to surgeon and/or anesthesiologist.  Myra Gianotti, PA-C [facility] Short Stay Center/Anesthesiology Phone [phone] 07/14/2015 3:59 PM

## 2015-07-14 MED ORDER — SODIUM CHLORIDE 0.9 % IV SOLN
1000.0000 mg | INTRAVENOUS | Status: AC
  Administered 2015-07-15: 1000 mg via INTRAVENOUS
  Filled 2015-07-14: qty 10

## 2015-07-14 MED ORDER — BUPIVACAINE LIPOSOME 1.3 % IJ SUSP
20.0000 mL | INTRAMUSCULAR | Status: DC
  Filled 2015-07-14: qty 20

## 2015-07-14 MED ORDER — CHLORHEXIDINE GLUCONATE 4 % EX LIQD: 60.0000 mL | Freq: Once | CUTANEOUS | Status: DC

## 2015-07-14 MED ORDER — CEFAZOLIN SODIUM-DEXTROSE 2-3 GM-% IV SOLR
2.0000 g | INTRAVENOUS | Status: AC
  Administered 2015-07-15: 2 g via INTRAVENOUS
  Filled 2015-07-14: qty 50

## 2015-07-15 ENCOUNTER — Inpatient Hospital Stay (HOSPITAL_COMMUNITY): Payer: Commercial Managed Care - HMO | Admitting: Emergency Medicine

## 2015-07-15 ENCOUNTER — Encounter (HOSPITAL_COMMUNITY): Payer: Self-pay | Admitting: General Practice

## 2015-07-15 ENCOUNTER — Inpatient Hospital Stay (HOSPITAL_COMMUNITY): Payer: Commercial Managed Care - HMO | Admitting: Certified Registered Nurse Anesthetist

## 2015-07-15 ENCOUNTER — Inpatient Hospital Stay (HOSPITAL_COMMUNITY)
Admission: RE | Admit: 2015-07-15 | Discharge: 2015-07-17 | DRG: 470 | Disposition: A | Discharge status: Home Health | Payer: Commercial Managed Care - HMO | Source: Ambulatory Visit | Attending: Orthopaedic Surgery | Admitting: Orthopaedic Surgery

## 2015-07-15 ENCOUNTER — Encounter (HOSPITAL_COMMUNITY)
Admission: RE | Disposition: A | Discharge status: Home Health | Payer: Self-pay | Source: Ambulatory Visit | Attending: Orthopaedic Surgery

## 2015-07-15 ENCOUNTER — Inpatient Hospital Stay (HOSPITAL_COMMUNITY): Payer: Commercial Managed Care - HMO

## 2015-07-15 DIAGNOSIS — M1731 Unilateral post-traumatic osteoarthritis, right knee: Secondary | ICD-10-CM | POA: Diagnosis not present

## 2015-07-15 DIAGNOSIS — Z885 Allergy status to narcotic agent status: Secondary | ICD-10-CM

## 2015-07-15 DIAGNOSIS — Z79899 Other long term (current) drug therapy: Secondary | ICD-10-CM

## 2015-07-15 DIAGNOSIS — K7460 Unspecified cirrhosis of liver: Secondary | ICD-10-CM | POA: Diagnosis not present

## 2015-07-15 DIAGNOSIS — M1711 Unilateral primary osteoarthritis, right knee: Principal | ICD-10-CM | POA: Diagnosis present

## 2015-07-15 DIAGNOSIS — Z8505 Personal history of malignant neoplasm of liver: Secondary | ICD-10-CM | POA: Diagnosis not present

## 2015-07-15 DIAGNOSIS — E785 Hyperlipidemia, unspecified: Secondary | ICD-10-CM | POA: Diagnosis present

## 2015-07-15 DIAGNOSIS — D696 Thrombocytopenia, unspecified: Secondary | ICD-10-CM | POA: Diagnosis present

## 2015-07-15 DIAGNOSIS — Z9889 Other specified postprocedural states: Secondary | ICD-10-CM | POA: Diagnosis not present

## 2015-07-15 DIAGNOSIS — R569 Unspecified convulsions: Secondary | ICD-10-CM | POA: Diagnosis present

## 2015-07-15 DIAGNOSIS — F319 Bipolar disorder, unspecified: Secondary | ICD-10-CM | POA: Diagnosis not present

## 2015-07-15 DIAGNOSIS — M179 Osteoarthritis of knee, unspecified: Secondary | ICD-10-CM | POA: Diagnosis not present

## 2015-07-15 DIAGNOSIS — Z96659 Presence of unspecified artificial knee joint: Secondary | ICD-10-CM

## 2015-07-15 DIAGNOSIS — F17210 Nicotine dependence, cigarettes, uncomplicated: Secondary | ICD-10-CM | POA: Diagnosis present

## 2015-07-15 DIAGNOSIS — D62 Acute posthemorrhagic anemia: Secondary | ICD-10-CM | POA: Diagnosis not present

## 2015-07-15 DIAGNOSIS — G8918 Other acute postprocedural pain: Secondary | ICD-10-CM | POA: Diagnosis not present

## 2015-07-15 HISTORY — DX: Other specified postprocedural states: R11.2

## 2015-07-15 HISTORY — PX: TOTAL KNEE ARTHROPLASTY: SHX125

## 2015-07-15 HISTORY — DX: Unspecified osteoarthritis, unspecified site: M19.90

## 2015-07-15 HISTORY — DX: Other specified postprocedural states: Z98.890

## 2015-07-15 HISTORY — DX: Malignant (primary) neoplasm, unspecified: C80.1

## 2015-07-15 LAB — PLATELET COUNT: Platelets: 48 10*3/uL — ABNORMAL LOW (ref 150–400)

## 2015-07-15 SURGERY — ARTHROPLASTY, KNEE, TOTAL
Anesthesia: General | Site: Knee | Laterality: Right

## 2015-07-15 MED ORDER — SODIUM CHLORIDE 0.9 % IR SOLN
Status: DC | PRN
  Administered 2015-07-15: 3000 mL
  Administered 2015-07-15: 1000 mL
  Administered 2015-07-15: 500 mL

## 2015-07-15 MED ORDER — 0.9 % SODIUM CHLORIDE (POUR BTL) OPTIME
TOPICAL | Status: DC | PRN
  Administered 2015-07-15: 1000 mL

## 2015-07-15 MED ORDER — SODIUM CHLORIDE 0.9 % IV SOLN
INTRAVENOUS | Status: DC
Start: 2015-07-15 — End: 2015-07-17
  Administered 2015-07-15 – 2015-07-16 (×2): via INTRAVENOUS

## 2015-07-15 MED ORDER — MORPHINE SULFATE (PF) 2 MG/ML IV SOLN
2.0000 mg | INTRAVENOUS | Status: DC | PRN
  Administered 2015-07-15: 2 mg via INTRAVENOUS
  Filled 2015-07-15: qty 1

## 2015-07-15 MED ORDER — MIDAZOLAM HCL 2 MG/2ML IJ SOLN
INTRAMUSCULAR | Status: AC
  Filled 2015-07-15: qty 4

## 2015-07-15 MED ORDER — OXYCODONE HCL ER 10 MG PO T12A
10.0000 mg | EXTENDED_RELEASE_TABLET | Freq: Two times a day (BID) | ORAL | Status: DC
  Administered 2015-07-15 – 2015-07-17 (×4): 10 mg via ORAL
  Filled 2015-07-15 (×4): qty 1

## 2015-07-15 MED ORDER — OXYCODONE HCL 5 MG PO TABS
ORAL_TABLET | ORAL | Status: AC
  Filled 2015-07-15: qty 2

## 2015-07-15 MED ORDER — ASPIRIN EC 325 MG PO TBEC: 325.0000 mg | DELAYED_RELEASE_TABLET | Freq: Two times a day (BID) | ORAL | Status: DC

## 2015-07-15 MED ORDER — SPIRONOLACTONE 50 MG PO TABS
50.0000 mg | ORAL_TABLET | Freq: Every day | ORAL | Status: DC
  Administered 2015-07-16: 50 mg via ORAL
  Filled 2015-07-15 (×3): qty 1

## 2015-07-15 MED ORDER — ACETAMINOPHEN 650 MG RE SUPP: 650.0000 mg | Freq: Four times a day (QID) | RECTAL | Status: DC | PRN

## 2015-07-15 MED ORDER — FENTANYL CITRATE (PF) 250 MCG/5ML IJ SOLN
INTRAMUSCULAR | Status: AC
  Filled 2015-07-15: qty 5

## 2015-07-15 MED ORDER — METHOCARBAMOL 500 MG PO TABS
500.0000 mg | ORAL_TABLET | Freq: Four times a day (QID) | ORAL | Status: DC | PRN
  Administered 2015-07-16 – 2015-07-17 (×4): 500 mg via ORAL
  Filled 2015-07-15 (×5): qty 1

## 2015-07-15 MED ORDER — MIDAZOLAM HCL 2 MG/2ML IJ SOLN
INTRAMUSCULAR | Status: AC
  Administered 2015-07-15: 2 mg
  Filled 2015-07-15: qty 2

## 2015-07-15 MED ORDER — PROPRANOLOL HCL 10 MG PO TABS
10.0000 mg | ORAL_TABLET | Freq: Two times a day (BID) | ORAL | Status: DC
  Administered 2015-07-16 – 2015-07-17 (×2): 10 mg via ORAL
  Filled 2015-07-15 (×5): qty 1

## 2015-07-15 MED ORDER — MENTHOL 3 MG MT LOZG: 1.0000 | LOZENGE | OROMUCOSAL | Status: DC | PRN

## 2015-07-15 MED ORDER — SODIUM CHLORIDE 0.9 % IV SOLN: Freq: Once | INTRAVENOUS | Status: DC

## 2015-07-15 MED ORDER — MIDAZOLAM HCL 2 MG/2ML IJ SOLN: 2.0000 mg | Freq: Once | INTRAMUSCULAR | Status: DC

## 2015-07-15 MED ORDER — CELECOXIB 200 MG PO CAPS
200.0000 mg | ORAL_CAPSULE | Freq: Two times a day (BID) | ORAL | Status: DC
  Administered 2015-07-16 – 2015-07-17 (×2): 200 mg via ORAL
  Filled 2015-07-15 (×2): qty 1

## 2015-07-15 MED ORDER — SODIUM CHLORIDE 0.9 % IV SOLN
INTRAVENOUS | Status: DC | PRN
  Administered 2015-07-15: 14:00:00 via INTRAVENOUS

## 2015-07-15 MED ORDER — PROPOFOL 10 MG/ML IV BOLUS
INTRAVENOUS | Status: AC
  Filled 2015-07-15: qty 20

## 2015-07-15 MED ORDER — ONDANSETRON HCL 4 MG/2ML IJ SOLN
INTRAMUSCULAR | Status: DC | PRN
  Administered 2015-07-15: 4 mg via INTRAVENOUS

## 2015-07-15 MED ORDER — DEXTROSE 5 % IV SOLN
500.0000 mg | Freq: Four times a day (QID) | INTRAVENOUS | Status: DC | PRN
  Filled 2015-07-15: qty 5

## 2015-07-15 MED ORDER — ACETAMINOPHEN 500 MG PO TABS
1000.0000 mg | ORAL_TABLET | Freq: Four times a day (QID) | ORAL | Status: AC
  Administered 2015-07-16 (×2): 1000 mg via ORAL
  Filled 2015-07-15 (×2): qty 2

## 2015-07-15 MED ORDER — PROPOFOL 10 MG/ML IV BOLUS
INTRAVENOUS | Status: DC | PRN
  Administered 2015-07-15: 150 mg via INTRAVENOUS

## 2015-07-15 MED ORDER — PHENYLEPHRINE HCL 10 MG/ML IJ SOLN
INTRAMUSCULAR | Status: DC | PRN
  Administered 2015-07-15: 40 ug via INTRAVENOUS

## 2015-07-15 MED ORDER — LACTATED RINGERS IV SOLN
INTRAVENOUS | Status: DC
  Administered 2015-07-15 (×2): via INTRAVENOUS

## 2015-07-15 MED ORDER — METOCLOPRAMIDE HCL 5 MG/ML IJ SOLN: 5.0000 mg | Freq: Three times a day (TID) | INTRAMUSCULAR | Status: DC | PRN

## 2015-07-15 MED ORDER — ROCURONIUM BROMIDE 100 MG/10ML IV SOLN
INTRAVENOUS | Status: DC | PRN
  Administered 2015-07-15: 50 mg via INTRAVENOUS

## 2015-07-15 MED ORDER — METOCLOPRAMIDE HCL 5 MG PO TABS: 5.0000 mg | ORAL_TABLET | Freq: Three times a day (TID) | ORAL | Status: DC | PRN

## 2015-07-15 MED ORDER — ACETAMINOPHEN 325 MG PO TABS: 650.0000 mg | ORAL_TABLET | Freq: Four times a day (QID) | ORAL | Status: DC | PRN

## 2015-07-15 MED ORDER — FENTANYL CITRATE (PF) 100 MCG/2ML IJ SOLN
INTRAMUSCULAR | Status: AC
  Filled 2015-07-15: qty 2

## 2015-07-15 MED ORDER — LACTULOSE 10 G PO PACK: 10.0000 g | PACK | Freq: Every day | ORAL | Status: DC | PRN

## 2015-07-15 MED ORDER — ROCURONIUM BROMIDE 50 MG/5ML IV SOLN
INTRAVENOUS | Status: AC
  Filled 2015-07-15: qty 1

## 2015-07-15 MED ORDER — ASPIRIN EC 325 MG PO TBEC
325.0000 mg | DELAYED_RELEASE_TABLET | Freq: Two times a day (BID) | ORAL | Status: DC
  Filled 2015-07-15 (×2): qty 1

## 2015-07-15 MED ORDER — ALUM & MAG HYDROXIDE-SIMETH 200-200-20 MG/5ML PO SUSP: 30.0000 mL | ORAL | Status: DC | PRN

## 2015-07-15 MED ORDER — ALBUMIN HUMAN 5 % IV SOLN
INTRAVENOUS | Status: DC | PRN
  Administered 2015-07-15: 14:00:00 via INTRAVENOUS

## 2015-07-15 MED ORDER — LEVETIRACETAM 500 MG PO TABS
1000.0000 mg | ORAL_TABLET | Freq: Two times a day (BID) | ORAL | Status: DC
  Administered 2015-07-15 – 2015-07-17 (×4): 1000 mg via ORAL
  Filled 2015-07-15 (×4): qty 2

## 2015-07-15 MED ORDER — SUGAMMADEX SODIUM 200 MG/2ML IV SOLN
INTRAVENOUS | Status: AC
  Filled 2015-07-15: qty 2

## 2015-07-15 MED ORDER — ONDANSETRON HCL 4 MG/2ML IJ SOLN: 4.0000 mg | Freq: Four times a day (QID) | INTRAMUSCULAR | Status: DC | PRN

## 2015-07-15 MED ORDER — POTASSIUM CHLORIDE ER 10 MEQ PO TBCR
10.0000 meq | EXTENDED_RELEASE_TABLET | Freq: Two times a day (BID) | ORAL | Status: DC
  Administered 2015-07-15 – 2015-07-17 (×4): 10 meq via ORAL
  Filled 2015-07-15 (×5): qty 1

## 2015-07-15 MED ORDER — ONDANSETRON HCL 4 MG PO TABS: 4.0000 mg | ORAL_TABLET | Freq: Four times a day (QID) | ORAL | Status: DC | PRN

## 2015-07-15 MED ORDER — DIPHENHYDRAMINE HCL 12.5 MG/5ML PO ELIX: 25.0000 mg | ORAL_SOLUTION | ORAL | Status: DC | PRN

## 2015-07-15 MED ORDER — EPHEDRINE SULFATE 50 MG/ML IJ SOLN
INTRAMUSCULAR | Status: DC | PRN
  Administered 2015-07-15 (×3): 5 mg via INTRAVENOUS

## 2015-07-15 MED ORDER — OXYCODONE HCL 5 MG PO TABS: 5.0000 mg | ORAL_TABLET | ORAL | Status: DC | PRN

## 2015-07-15 MED ORDER — SCOPOLAMINE 1 MG/3DAYS TD PT72
MEDICATED_PATCH | TRANSDERMAL | Status: AC
  Filled 2015-07-15: qty 1

## 2015-07-15 MED ORDER — ARTIFICIAL TEARS OP OINT
TOPICAL_OINTMENT | OPHTHALMIC | Status: DC | PRN
  Administered 2015-07-15: 1 via OPHTHALMIC

## 2015-07-15 MED ORDER — SODIUM CHLORIDE 0.9 % IJ SOLN
INTRAMUSCULAR | Status: DC | PRN
  Administered 2015-07-15: 40 mL

## 2015-07-15 MED ORDER — SPIRONOLACTONE 100 MG PO TABS
100.0000 mg | ORAL_TABLET | Freq: Every day | ORAL | Status: DC
  Administered 2015-07-16 – 2015-07-17 (×2): 100 mg via ORAL
  Filled 2015-07-15 (×2): qty 1

## 2015-07-15 MED ORDER — BUPIVACAINE LIPOSOME 1.3 % IJ SUSP
INTRAMUSCULAR | Status: DC | PRN
  Administered 2015-07-15: 20 mL

## 2015-07-15 MED ORDER — ONDANSETRON HCL 4 MG/2ML IJ SOLN: 4.0000 mg | Freq: Once | INTRAMUSCULAR | Status: DC | PRN

## 2015-07-15 MED ORDER — SPIRONOLACTONE 50 MG PO TABS: 50.0000 mg | ORAL_TABLET | Freq: Two times a day (BID) | ORAL | Status: DC

## 2015-07-15 MED ORDER — CEFAZOLIN SODIUM-DEXTROSE 2-3 GM-% IV SOLR
2.0000 g | Freq: Four times a day (QID) | INTRAVENOUS | Status: AC
  Administered 2015-07-15 – 2015-07-16 (×2): 2 g via INTRAVENOUS
  Filled 2015-07-15 (×2): qty 50

## 2015-07-15 MED ORDER — LIDOCAINE HCL (CARDIAC) 20 MG/ML IV SOLN
INTRAVENOUS | Status: AC
  Filled 2015-07-15: qty 5

## 2015-07-15 MED ORDER — FENTANYL CITRATE (PF) 100 MCG/2ML IJ SOLN
100.0000 ug | Freq: Once | INTRAMUSCULAR | Status: AC
  Administered 2015-07-15: 100 ug via INTRAVENOUS
  Filled 2015-07-15: qty 2

## 2015-07-15 MED ORDER — FENTANYL CITRATE (PF) 100 MCG/2ML IJ SOLN
25.0000 ug | INTRAMUSCULAR | Status: DC | PRN
  Administered 2015-07-15 (×3): 50 ug via INTRAVENOUS

## 2015-07-15 MED ORDER — ZINC GLUCONATE 50 MG PO TABS: 50.0000 mg | ORAL_TABLET | Freq: Three times a day (TID) | ORAL | Status: DC

## 2015-07-15 MED ORDER — GLYCOPYRROLATE 0.2 MG/ML IJ SOLN
INTRAMUSCULAR | Status: AC
  Filled 2015-07-15: qty 3

## 2015-07-15 MED ORDER — OXYCODONE HCL ER 10 MG PO T12A: 10.0000 mg | EXTENDED_RELEASE_TABLET | Freq: Two times a day (BID) | ORAL | Status: DC

## 2015-07-15 MED ORDER — FUROSEMIDE 40 MG PO TABS
60.0000 mg | ORAL_TABLET | Freq: Two times a day (BID) | ORAL | Status: DC
  Administered 2015-07-16 – 2015-07-17 (×3): 60 mg via ORAL
  Filled 2015-07-15 (×6): qty 1

## 2015-07-15 MED ORDER — LIDOCAINE HCL (CARDIAC) 20 MG/ML IV SOLN
INTRAVENOUS | Status: DC | PRN
  Administered 2015-07-15: 60 mg via INTRAVENOUS

## 2015-07-15 MED ORDER — ARTIFICIAL TEARS OP OINT
TOPICAL_OINTMENT | OPHTHALMIC | Status: AC
  Filled 2015-07-15: qty 3.5

## 2015-07-15 MED ORDER — PHENOL 1.4 % MT LIQD: 1.0000 | OROMUCOSAL | Status: DC | PRN

## 2015-07-15 MED ORDER — FENTANYL CITRATE (PF) 100 MCG/2ML IJ SOLN
INTRAMUSCULAR | Status: DC | PRN
  Administered 2015-07-15 (×2): 50 ug via INTRAVENOUS
  Administered 2015-07-15: 100 ug via INTRAVENOUS

## 2015-07-15 MED ORDER — OXYCODONE HCL 5 MG PO TABS
5.0000 mg | ORAL_TABLET | ORAL | Status: DC | PRN
  Administered 2015-07-15 – 2015-07-16 (×4): 10 mg via ORAL
  Administered 2015-07-17: 5 mg via ORAL
  Administered 2015-07-17: 10 mg via ORAL
  Filled 2015-07-15 (×6): qty 2

## 2015-07-15 MED ORDER — ONDANSETRON HCL 4 MG/2ML IJ SOLN
INTRAMUSCULAR | Status: AC
  Filled 2015-07-15: qty 2

## 2015-07-15 MED ORDER — POVIDONE-IODINE 10 % EX SOLN
CUTANEOUS | Status: DC | PRN
  Administered 2015-07-15: 1 via TOPICAL

## 2015-07-15 MED ORDER — SUGAMMADEX SODIUM 200 MG/2ML IV SOLN
INTRAVENOUS | Status: DC | PRN
  Administered 2015-07-15: 200 mg via INTRAVENOUS

## 2015-07-15 SURGICAL SUPPLY — 69 items
BANDAGE ELASTIC 6 VELCRO ST LF (GAUZE/BANDAGES/DRESSINGS) ×2 IMPLANT
BANDAGE ESMARK 6X9 LF (GAUZE/BANDAGES/DRESSINGS) ×1 IMPLANT
BASEPLATE TIBIAL SZ7 RT LEGION (Plate) ×2 IMPLANT
BLADE SAG 18X100X1.27 (BLADE) ×2 IMPLANT
BLADE SAW SGTL 13.0X1.19X90.0M (BLADE) ×2 IMPLANT
BNDG ESMARK 6X9 LF (GAUZE/BANDAGES/DRESSINGS) ×2
BONE CEMENT PALACOSE (Orthopedic Implant) ×6 IMPLANT
BOWL SMART MIX CTS (DISPOSABLE) ×2 IMPLANT
CEMENT BONE PALACOSE (Orthopedic Implant) ×3 IMPLANT
CEMENT RESTRICTOR DEPUY SZ 4 (Cement) ×2 IMPLANT
COVER SURGICAL LIGHT HANDLE (MISCELLANEOUS) ×2 IMPLANT
CUFF TOURNIQUET SINGLE 34IN LL (TOURNIQUET CUFF) ×2 IMPLANT
DERMABOND ADVANCED (GAUZE/BANDAGES/DRESSINGS) ×1
DERMABOND ADVANCED .7 DNX12 (GAUZE/BANDAGES/DRESSINGS) ×1 IMPLANT
DRAPE EXTREMITY T 121X128X90 (DRAPE) ×2 IMPLANT
DRAPE IMP U-DRAPE 54X76 (DRAPES) ×2 IMPLANT
DRAPE INCISE IOBAN 66X45 STRL (DRAPES) ×2 IMPLANT
DRAPE ORTHO SPLIT 77X108 STRL (DRAPES) ×2
DRAPE PROXIMA HALF (DRAPES) ×2 IMPLANT
DRAPE SURG 17X11 SM STRL (DRAPES) ×4 IMPLANT
DRAPE SURG ORHT 6 SPLT 77X108 (DRAPES) ×2 IMPLANT
DRSG AQUACEL AG ADV 3.5X14 (GAUZE/BANDAGES/DRESSINGS) ×2 IMPLANT
DRSG PAD ABDOMINAL 8X10 ST (GAUZE/BANDAGES/DRESSINGS) ×2 IMPLANT
DURAPREP 26ML APPLICATOR (WOUND CARE) ×6 IMPLANT
ELECT CAUTERY BLADE 6.4 (BLADE) ×2 IMPLANT
ELECT REM PT RETURN 9FT ADLT (ELECTROSURGICAL) ×2
ELECTRODE REM PT RTRN 9FT ADLT (ELECTROSURGICAL) ×1 IMPLANT
EVACUATOR 1/8 PVC DRAIN (DRAIN) IMPLANT
FACESHIELD WRAPAROUND (MASK) ×2 IMPLANT
FEMORAL GENESIS PRIM LUGS (Orthopedic Implant) ×2 IMPLANT
FEMUR OXINIUM SZ 7 (Knees) ×2 IMPLANT
GAUZE SPONGE 4X4 12PLY STRL (GAUZE/BANDAGES/DRESSINGS) ×2 IMPLANT
GAUZE XEROFORM 5X9 LF (GAUZE/BANDAGES/DRESSINGS) ×2 IMPLANT
GLOVE SURG SYN 7.5  E (GLOVE) ×2
GLOVE SURG SYN 7.5 E (GLOVE) ×2 IMPLANT
GOWN STRL REIN XL XLG (GOWN DISPOSABLE) ×6 IMPLANT
HANDPIECE INTERPULSE COAX TIP (DISPOSABLE) ×1
IMMOBILIZER KNEE 22 UNIV (SOFTGOODS) ×2 IMPLANT
INSERT ARTI HIGH FLEX 7-8 9MM (Knees) ×2 IMPLANT
KIT BASIN OR (CUSTOM PROCEDURE TRAY) ×2 IMPLANT
KIT ROOM TURNOVER OR (KITS) ×2 IMPLANT
MANIFOLD NEPTUNE II (INSTRUMENTS) ×2 IMPLANT
NEEDLE SPNL 18GX3.5 QUINCKE PK (NEEDLE) ×2 IMPLANT
NS IRRIG 1000ML POUR BTL (IV SOLUTION) ×2 IMPLANT
PACK TOTAL JOINT (CUSTOM PROCEDURE TRAY) ×2 IMPLANT
PACK UNIVERSAL I (CUSTOM PROCEDURE TRAY) ×2 IMPLANT
PAD ARMBOARD 7.5X6 YLW CONV (MISCELLANEOUS) ×4 IMPLANT
PAD CAST 4YDX4 CTTN HI CHSV (CAST SUPPLIES) ×1 IMPLANT
PADDING CAST COTTON 4X4 STRL (CAST SUPPLIES) ×1
PADDING CAST COTTON 6X4 STRL (CAST SUPPLIES) ×2 IMPLANT
PEN SKIN MARKING BROAD (MISCELLANEOUS) ×2 IMPLANT
SEALER BIPOLAR AQUA 6.0 (INSTRUMENTS) ×2 IMPLANT
SET HNDPC FAN SPRY TIP SCT (DISPOSABLE) ×1 IMPLANT
SPONGE GAUZE 4X4 12PLY STER LF (GAUZE/BANDAGES/DRESSINGS) ×2 IMPLANT
STAPLER VISISTAT 35W (STAPLE) IMPLANT
STEM CEMENT LEGION 18X160MM (Knees) ×2 IMPLANT
SUCTION FRAZIER TIP 10 FR DISP (SUCTIONS) IMPLANT
SUT ETHILON 2 0 FS 18 (SUTURE) ×4 IMPLANT
SUT VIC AB 0 CT1 27 (SUTURE) ×2
SUT VIC AB 0 CT1 27XBRD ANBCTR (SUTURE) ×2 IMPLANT
SUT VIC AB 1 CT1 27 (SUTURE) ×2
SUT VIC AB 1 CT1 27XBRD ANBCTR (SUTURE) ×2 IMPLANT
SUT VIC AB 2-0 CT1 27 (SUTURE) ×3
SUT VIC AB 2-0 CT1 TAPERPNT 27 (SUTURE) ×3 IMPLANT
SYR 50ML LL SCALE MARK (SYRINGE) ×2 IMPLANT
TOWEL OR 17X24 6PK STRL BLUE (TOWEL DISPOSABLE) ×2 IMPLANT
TOWEL OR 17X26 10 PK STRL BLUE (TOWEL DISPOSABLE) ×2 IMPLANT
WATER STERILE IRR 1000ML POUR (IV SOLUTION) ×4 IMPLANT
WRAP KNEE MAXI GEL POST OP (GAUZE/BANDAGES/DRESSINGS) ×2 IMPLANT

## 2015-07-15 NOTE — H&P (Signed)
PREOPERATIVE H&P  Chief Complaint: right knee degenerative joint disease  HPI: [name] is a 49 y.o. male who presents for surgical treatment of right knee degenerative joint disease.  He denies any changes in medical history.  Past Medical History  Diagnosis Date  . Hernia   . Bipolar affective disorder ([place])     [place]  . Depression   . Seizures ([place])     Dr Jannifer Franklin  . Abuse, drug or alcohol   . Wilson disease   . Encephalopathy   . Thrombocytopenia ([place])   . Chronic liver disease and cirrhosis   . History of alcohol abuse   . Dyslipidemia   . PONV (postoperative nausea and vomiting)   . DJD (degenerative joint disease)     right knee  . Cancer [facility])     liver cancer treated with micrablation  . Esophageal varices ([facility])    Past Surgical History  Procedure Laterality Date  . Left inguinal hernia  june 2012    [place]  . Vascular surgery    . Open reduction internal fixation (orif) tibia/fibula fracture Right 07/25/2013    Procedure: OPEN REDUCTION INTERNAL FIXATION (ORIF) RIGHT TIBIAL PLATEAU, TIBIAL SHAFT AND FIBULA FRACTURES, POSSIBLE FASCIOTOMIES;  Surgeon: Marianna Payment, MD;  Location: [place];  Service: Orthopedics;  Laterality: Right;  . Hardware removal Right 01/22/2015    Procedure: HARDWARE REMOVAL RIGHT LEG;  Surgeon: Leandrew Koyanagi, MD;  Location: [place];  Service: Orthopedics;  Laterality: Right;  . Microablation of liver    . Esophagogastroduodenoscopy     Social History   Social History  . Marital Status: Single    Spouse Name: N/A  . Number of Children: 2  . Years of Education: HS   Occupational History  . disabled    Social History Main Topics  . Smoking status: Current Every Day Smoker -- 0.50 packs/day    Types: Cigarettes  . Smokeless tobacco: Never Used  . Alcohol Use: No     Comment: 3-4 bottles a week, quit drinking 2005  . Drug Use: No     Comment: last use 2005  . Sexual Activity: Yes   Other  Topics Concern  . None   Social History Narrative   Patient is right handed.   Patient drinks some caffeine daily.   Family History  Problem Relation Age of Onset  . Heart disease Other   . Diabetes Father   . Dementia Mother   . Glaucoma Brother    Allergies  Allergen Reactions  . Codeine Nausea And Vomiting    Passed out, was also drinking at the time   Prior to Admission medications   Medication Sig Start Date End Date Taking? Authorizing Provider  acetaminophen (TYLENOL) 500 MG tablet Take 500 mg by mouth every 6 (six) hours as needed (pain). Take 3 tabs as needed for pain.   Yes Historical Provider, MD  furosemide (LASIX) 20 MG tablet Take 60 mg by mouth 2 (two) times daily.   Yes Historical Provider, MD  lactulose (CEPHULAC) 10 G packet Take 10 g by mouth daily as needed (toxin buildup).  07/03/11  Yes Historical Provider, MD  levETIRAcetam (KEPPRA) 1000 MG tablet Take 1 tablet (1,000 mg total) by mouth 2 (two) times daily. 09/16/14  Yes Kathrynn Ducking, MD  MILK THISTLE PO Take 1 tablet by mouth 2 (two) times daily.   Yes Historical Provider, MD  oxyCODONE (OXY IR/ROXICODONE) 5  MG immediate release tablet Take 1-3 tablets (5-15 mg total) by mouth every 4 (four) hours as needed. Patient taking differently: Take 5-15 mg by mouth every 4 (four) hours as needed for severe pain.  04/30/15  Yes Biagio Borg, MD  potassium chloride (K-DUR) 10 MEQ tablet TAKE 1 TABLET BY MOUTH TWICE DAILY 06/28/15  Yes Biagio Borg, MD  propranolol (INDERAL) 10 MG tablet Take 10 mg by mouth 2 (two) times daily.    Yes Historical Provider, MD  spironolactone (ALDACTONE) 100 MG tablet Take 50-100 mg by mouth 2 (two) times daily. 100 mg in the morning and 50 mg in the evening   Yes Historical Provider, MD  zinc gluconate 50 MG tablet Take 50 mg by mouth 3 (three) times daily.    Yes Historical Provider, MD     Positive ROS: All other systems have been reviewed and were otherwise negative with the  exception of those mentioned in the HPI and as above.  Physical Exam: General: Alert, no acute distress Cardiovascular: No pedal edema Respiratory: No cyanosis, no use of accessory musculature GI: abdomen soft Skin: No lesions in the area of chief complaint Neurologic: Sensation intact distally Psychiatric: Patient is competent for consent with normal mood and affect Lymphatic: no lymphedema  MUSCULOSKELETAL: exam stable  Assessment: right knee degenerative joint disease  Plan: Plan for Procedure(s): RIGHT TOTAL KNEE ARTHROPLASTY  The risks benefits and alternatives were discussed with the patient including but not limited to the risks of nonoperative treatment, versus surgical intervention including infection, bleeding, nerve injury,  blood clots, cardiopulmonary complications, morbidity, mortality, among others, and they were willing to proceed.   Marianna Payment, MD   07/15/2015 7:22 AM '

## 2015-07-15 NOTE — Op Note (Signed)
Total Knee Arthroplasty Procedure Note DORSEY HAROLDSON MD:[id number] 07/15/2015   Preoperative diagnosis: Right knee osteoarthritis  Postoperative diagnosis:same  Operative procedure: Right total knee arthroplasty. CPT [phone]  Surgeon: N. Eduard Roux, MD  Assistants: April Green, RNFA  Anesthesia: General  Tourniquet time: less than 2 hrs  Implants used: Smith and Nephew Femur: Legion 7, PS Tibia:Genesis II Cemented 7, 160 stem Patella: 35 mm Polyethylene: 9 mm  Indication: [name] is a 49 y.o. year old male with a history of knee pain. Having failed conservative management, the patient elected to proceed with a total knee arthroplasty.  We have reviewed the risk and benefits of the surgery and they elected to proceed after voicing understanding.  Procedure:  After informed consent was obtained and understanding of the risk were voiced including but not limited to bleeding, infection, damage to surrounding structures including nerves and vessels, blood clots, leg length inequality and the failure to achieve desired results, the operative extremity was marked with verbal confirmation of the patient in the holding area.   The patient was then brought to the operating room and transported to the operating room table in the supine position.  A tourniquet was applied to the operative extremity around the upper thigh. The operative limb was then prepped and draped in the usual sterile fashion and preoperative antibiotics were administered.  A time out was performed prior to the start of surgery confirming the correct extremity, preoperative antibiotic administration, as well as team members, implants and instruments available for the case. Correct surgical site was also confirmed with preoperative radiographs. The limb was then elevated for exsanguination and the tourniquet was inflated. A midline incision was made and a standard medial parapatellar approach was performed.  The patella  was prepared and sized to a 35 mm.  A cover was placed on the patella for protection from retractors.  We then turned our attention to the femur. Posterior cruciate ligament was sacrificed. Start site was drilled in the femur and the intramedullary distal femoral cutting guide was placed, set at 5 degrees valgus, taking 9 mm of distal resection. The distal cut was made. Osteophytes were then removed. Next, the proximal tibial cutting guide was placed with appropriate slope, varus/valgus alignment and depth of resection. The proximal tibial cut was made.  There was a residual lateral plateau defect from his posttraumatic disease.  Of note, his bone quality was extremely poor so we felt that he needed revision tibia with a cemented stem.  The tibial canal was sequentially reamed up to a 18 mm stem and 160 mm long.  Gap blocks were then used to assess the extension gap and alignment, and appropriate soft tissue releases were performed. Attention was turned back to the femur, which was sized using the sizing guide to a size 7. Appropriate rotation of the femoral component was determined using epicondylar axis, Whiteside's line, and assessing the flexion gap under ligament tension. The appropriate size 4-in-1 cutting block was placed and cuts were made. Posterior femoral osteophytes and uncapped bone were then removed with the curved osteotome. The tibia was sized for a size 7 component. The femoral box-cutting guide was placed and prepared for a PS femoral component. Trial components were placed, and stability was checked in full extension, mid-flexion, and deep flexion. Proper tibial rotation was determined and marked.  The patella tracked well without a lateral release. Trial components were then removed and tibial preparation performed. A posterior capsular injection comprising of 20 cc  of 1.3% exparel and 40 cc of normal saline was performed for postoperative pain control. The bony surfaces were irrigated with a  pulse lavage and then dried. A cement restrictor was placed in the tibial canal and the bone cement was pressurized down the canal.  Bone cement was vacuum mixed on the back table, and the final components sized above were cemented into place. After cement had finished curing, excess cement was removed. The stability of the construct was re-evaluated throughout a range of motion and found to be acceptable. The trial liner was removed, the knee was copiously irrigated, and the knee was re-evaluated for any excess bone debris. The real polyethylene liner, 9 mm thick, was inserted and checked to ensure the locking mechanism had engaged appropriately. The tourniquet was deflated and hemostasis was achieved. The wound was irrigated with dilute betadine in normal saline, and then again with normal saline. A drain was placed.  Capsular closure was performed with a #1 vicryl, subcutaneous fat closed with a 2.0 vicryl suture, then subcutaneous tissue closed with interrupted 2.0 vicryl suture. The skin was then closed with a 2.0 nylon. A sterile dressing was applied.   The patient was awakened in the operating room and taken to recovery in stable condition. All sponge, needle, and instrument counts were correct at the end of the case.  Position: supine  Complications: none.  Time Out: performed   Drains/Packing: 1 HVAC  Estimated blood loss: 100 cc  Returned to Recovery Room: in good condition.   Antibiotics: yes   Mechanical VTE (DVT) Prophylaxis: sequential compression devices, TED thigh-high  Chemical VTE (DVT) Prophylaxis: aspirin  Fluid Replacement  Crystalloid: see anesthesia record Blood: none  FFP: none   Specimens Removed: 1 to pathology   Sponge and Instrument Count Correct? yes   PACU: portable radiograph - knee AP and Lateral   Admission: inpatient status, start PT & OT POD#1  Plan/RTC: Return in 2 weeks for wound check.   Weight Bearing/Load Lower Extremity: full   N. Eduard Roux, MD [place] 4:05 PM

## 2015-07-15 NOTE — Anesthesia Procedure Notes (Addendum)
Anesthesia Regional Block:  Adductor canal block  Pre-Anesthetic Checklist: ,, timeout performed, Correct Patient, Correct Site, Correct Laterality, Correct Procedure, Correct Position, site marked, Risks and benefits discussed,  Surgical consent,  Pre-op evaluation,  At surgeon's request and post-op pain management  Laterality: Right  Prep: chloraprep       Needles:  Injection technique: Single-shot  Needle Type: Echogenic Stimulator Needle     Needle Length: 9cm 9 cm Needle Gauge: 21 and 21 G    Additional Needles:  Procedures: ultrasound guided (picture in chart) Adductor canal block Narrative:  Start time: 07/15/2015 12:20 PM End time: 07/15/2015 12:25 PM Injection made incrementally with aspirations every 5 mL.  Performed by: Personally   Additional Notes:  30 cc 0.5% Marcaine with 1:200 Epi injected easily   Procedure Name: Intubation Date/Time: 07/15/2015 1:35 PM Performed by: Willeen Cass P Pre-anesthesia Checklist: Patient identified, Emergency Drugs available, Suction available, Patient being monitored and Timeout performed Patient Re-evaluated:Patient Re-evaluated prior to inductionOxygen Delivery Method: Circle system utilized Preoxygenation: Pre-oxygenation with 100% oxygen Intubation Type: IV induction Ventilation: Mask ventilation without difficulty Laryngoscope Size: Mac and 4 Grade View: Grade I Tube type: Oral Tube size: 7.0 mm Airway Equipment and Method: Stylet Placement Confirmation: ETT inserted through vocal cords under direct vision,  positive ETCO2 and breath sounds checked- equal and bilateral Secured at: 23 cm Tube secured with: Tape Dental Injury: Teeth and Oropharynx as per pre-operative assessment

## 2015-07-15 NOTE — Anesthesia Preprocedure Evaluation (Signed)
Anesthesia Evaluation  Patient identified by MRN, date of birth, ID band Patient awake    Reviewed: Allergy & Precautions, NPO status , Patient's Chart, lab work & pertinent test results  Airway Mallampati: II  TM Distance: >3 FB Neck ROM: Full    Dental  (+) Teeth Intact, Dental Advisory Given   Pulmonary Current Smoker,    breath sounds clear to auscultation       Cardiovascular  Rhythm:Regular Rate:Normal     Neuro/Psych    GI/Hepatic   Endo/Other    Renal/GU      Musculoskeletal   Abdominal   Peds  Hematology   Anesthesia Other Findings   Reproductive/Obstetrics                             Anesthesia Physical Anesthesia Plan  ASA: III  Anesthesia Plan: General   Post-op Pain Management: GA combined w/ Regional for post-op pain   Induction: Intravenous  Airway Management Planned: Oral ETT  Additional Equipment:   Intra-op Plan:   Post-operative Plan: Extubation in OR  Informed Consent: I have reviewed the patients History and Physical, chart, labs and discussed the procedure including the risks, benefits and alternatives for the proposed anesthesia with the patient or authorized representative who has indicated his/her understanding and acceptance.   Dental advisory given  Plan Discussed with: CRNA and Anesthesiologist  Anesthesia Plan Comments:         Anesthesia Quick Evaluation

## 2015-07-15 NOTE — Progress Notes (Signed)
Orthopedic Tech Progress Note Patient Details:  [name] Jun 27, 1966 [id number] Applied ohf to bed, start time for cpm 5:00 pm CPM Right Knee Right Knee Flexion (Degrees): 90 Right Knee Extension (Degrees): 0   Braulio Bosch 07/15/2015, 5:09 PM

## 2015-07-15 NOTE — Anesthesia Postprocedure Evaluation (Signed)
  Anesthesia Post-op Note  Patient: [name]  Procedure(s) Performed: Procedure(s): RIGHT TOTAL KNEE ARTHROPLASTY (Right)  Patient Location: PACU  Anesthesia Type:General  Level of Consciousness: awake  Airway and Oxygen Therapy: Patient Spontanous Breathing  Post-op Pain: mild  Post-op Assessment: Post-op Vital signs reviewed LLE Motor Response: Responds to commands   RLE Motor Response: Responds to commands   L Sensory Level: S1-Sole of foot, small toes R Sensory Level: S1-Sole of foot, small toes  Post-op Vital Signs: Reviewed  Last Vitals:  Filed Vitals:   07/15/15 1805  BP: 116/58  Pulse: 58  Temp: 36.4 C  Resp: 14    Complications: No apparent anesthesia complications

## 2015-07-15 NOTE — Transfer of Care (Signed)
Immediate Anesthesia Transfer of Care Note  Patient: [name]  Procedure(s) Performed: Procedure(s): RIGHT TOTAL KNEE ARTHROPLASTY (Right)  Patient Location: PACU  Anesthesia Type:GA combined with regional for post-op pain  Level of Consciousness: awake and alert   Airway & Oxygen Therapy: Patient Spontanous Breathing and Patient connected to nasal cannula oxygen  Post-op Assessment: Report given to RN, Post -op Vital signs reviewed and stable and Patient moving all extremities X 4  Post vital signs: Reviewed and stable  Last Vitals:  Filed Vitals:   07/15/15 1633  BP:   Pulse:   Temp: 36.6 C  Resp:     Complications: No apparent anesthesia complications

## 2015-07-16 LAB — CBC WITH DIFFERENTIAL/PLATELET
Basophils Absolute: 0 10*3/uL (ref 0.0–0.1)
Basophils Relative: 0 %
Eosinophils Absolute: 0.1 10*3/uL (ref 0.0–0.7)
Eosinophils Relative: 2 %
HCT: 28.3 % — ABNORMAL LOW (ref 39.0–52.0)
Hemoglobin: 9.5 g/dL — ABNORMAL LOW (ref 13.0–17.0)
Lymphocytes Relative: 12 %
Lymphs Abs: 0.6 10*3/uL — ABNORMAL LOW (ref 0.7–4.0)
MCH: 34.1 pg — ABNORMAL HIGH (ref 26.0–34.0)
MCHC: 33.6 g/dL (ref 30.0–36.0)
MCV: 101.4 fL — ABNORMAL HIGH (ref 78.0–100.0)
Monocytes Absolute: 0.6 10*3/uL (ref 0.1–1.0)
Monocytes Relative: 11 %
Neutro Abs: 4.1 10*3/uL (ref 1.7–7.7)
Neutrophils Relative %: 75 %
Platelets: 36 10*3/uL — ABNORMAL LOW (ref 150–400)
RBC: 2.79 MIL/uL — ABNORMAL LOW (ref 4.22–5.81)
RDW: 15.6 % — ABNORMAL HIGH (ref 11.5–15.5)
WBC: 5.5 10*3/uL (ref 4.0–10.5)

## 2015-07-16 LAB — BASIC METABOLIC PANEL
Anion gap: 4 — ABNORMAL LOW (ref 5–15)
BUN: 11 mg/dL (ref 6–20)
CO2: 21 mmol/L — ABNORMAL LOW (ref 22–32)
Calcium: 8.2 mg/dL — ABNORMAL LOW (ref 8.9–10.3)
Chloride: 109 mmol/L (ref 101–111)
Creatinine, Ser: 0.75 mg/dL (ref 0.61–1.24)
GFR calc Af Amer: >60 mL/min (ref >60)
GFR calc non Af Amer: >60 mL/min (ref >60)
Glucose, Bld: 97 mg/dL (ref 65–99)
Potassium: 4.3 mmol/L (ref 3.5–5.1)
Sodium: 134 mmol/L — ABNORMAL LOW (ref 135–145)

## 2015-07-16 LAB — CBC
HCT: 29.7 % — ABNORMAL LOW (ref 39.0–52.0)
Hemoglobin: 9.8 g/dL — ABNORMAL LOW (ref 13.0–17.0)
MCH: 33.4 pg (ref 26.0–34.0)
MCHC: 33 g/dL (ref 30.0–36.0)
MCV: 101.4 fL — ABNORMAL HIGH (ref 78.0–100.0)
Platelets: 39 10*3/uL — ABNORMAL LOW (ref 150–400)
RBC: 2.93 MIL/uL — ABNORMAL LOW (ref 4.22–5.81)
RDW: 15.6 % — ABNORMAL HIGH (ref 11.5–15.5)
WBC: 6.4 10*3/uL (ref 4.0–10.5)

## 2015-07-16 LAB — TYPE AND SCREEN
ABO/RH(D): A POS
Antibody Screen: NEGATIVE

## 2015-07-16 LAB — PREPARE PLATELET PHERESIS: Unit division: 0

## 2015-07-16 LAB — PREPARE FRESH FROZEN PLASMA
Unit division: 0
Unit division: 0

## 2015-07-16 MED ORDER — SODIUM CHLORIDE 0.9 % IV SOLN
Freq: Once | INTRAVENOUS | Status: AC
  Administered 2015-07-16: 11:00:00 via INTRAVENOUS

## 2015-07-16 NOTE — Discharge Instructions (Signed)

## 2015-07-16 NOTE — Evaluation (Addendum)
Physical Therapy Evaluation Patient Details Name: [name] MRN: [id number] DOB: 1965-09-13 Today's Date: 07/16/2015   History of Present Illness  49 y.o. male admitted to [facility] on 07/15/15 for elective R TKA.  Pt with significant PMHx of bipolar, depression, seizure, encepholopathy, Wilson disease, thrombocytopenia, chronic liver disease/cirrhosis, h/o ETOH abuse, DJD, CA (liver), and ORIF R tib/fib fx.  Clinical Impression  Pt is POD #1 and is moving well, min assist overall and was able to progress gait into the hallway.  Pt will likely progress well enough to return home with limited assistance and home therapy, but may need at least two sessions tomorrow (and possibly more into Sunday).  He has very limited assistance at discharge (the person he is renting a room in his house to will check in on him).   PT to follow acutely for deficits listed below.       Follow Up Recommendations Home health PT;Supervision - Intermittent    Equipment Recommendations  Rolling walker with 5" wheels    Recommendations for Other Services   NA    Precautions / Restrictions Precautions Precautions: Knee Restrictions Weight Bearing Restrictions: Yes RLE Weight Bearing: Weight bearing as tolerated      Mobility  Bed Mobility Overal bed mobility: Needs Assistance Bed Mobility: Supine to Sit     Supine to sit: Min assist     General bed mobility comments: Min assist to help progress right leg over EOB.    Transfers Overall transfer level: Needs assistance Equipment used: Rolling walker (2 wheeled) Transfers: Sit to/from Stand Sit to Stand: Min assist         General transfer comment: Min assist to support trunk when going to stand.  Verbal cues for hand placement.    Ambulation/Gait Ambulation/Gait assistance: Min assist;+2 safety/equipment (chair to follow for safety) Ambulation Distance (Feet): 35 Feet Assistive device: Rolling walker (2 wheeled) Gait Pattern/deviations:  Step-to pattern;Antalgic Gait velocity: decreased Gait velocity interpretation: Below normal speed for age/gender General Gait Details: Verbal cues for upright posture, to relax shoulders and to put more weight through operative leg.          Balance Overall balance assessment: Needs assistance Sitting-balance support: Feet supported;No upper extremity supported Sitting balance-Leahy Scale: Good     Standing balance support: Bilateral upper extremity supported Standing balance-Leahy Scale: Poor Standing balance comment: RW and external assist from PT                             Pertinent Vitals/Pain Pain Assessment: 0-10 Pain Score: 5  Pain Location: right knee Pain Descriptors / Indicators: Aching;Burning Pain Intervention(s): Limited activity within patient's tolerance;Monitored during session;Repositioned    Home Living Family/patient expects to be discharged to:: Private residence Living Arrangements: Alone Available Help at Discharge: Friend(s);Available PRN/intermittently Type of Home: House Home Access: Stairs to enter Entrance Stairs-Rails: Psychiatric nurse of Steps: 3 Home Layout: One level Home Equipment: Tub bench;Bedside commode;Cane - single point      Prior Function Level of Independence: Independent               Hand Dominance   Dominant Hand: Right    Extremity/Trunk Assessment   Upper Extremity Assessment: Defer to OT evaluation           Lower Extremity Assessment: RLE deficits/detail RLE Deficits / Details: right leg with normal post op pain and weakness.  Pt with at least 3/5 ankle DF, 2/5 knee,  2+/5 hip flexion    Cervical / Trunk Assessment: Normal  Communication   Communication: No difficulties  Cognition Arousal/Alertness: Awake/alert Behavior During Therapy: WFL for tasks assessed/performed Overall Cognitive Status: Within Functional Limits for tasks assessed                          Exercises Total Joint Exercises Ankle Circles/Pumps: AROM;Both;10 reps;Seated Quad Sets: AROM;Right;10 reps;Supine Heel Slides: AAROM;Right;10 reps;Supine Goniometric ROM: 12-63 (supine in recliner chair)      Assessment/Plan    PT Assessment Patient needs continued PT services  PT Diagnosis Difficulty walking;Abnormality of gait;Generalized weakness;Acute pain   PT Problem List Decreased strength;Decreased range of motion;Decreased activity tolerance;Decreased balance;Decreased mobility;Decreased knowledge of use of DME;Pain;Decreased knowledge of precautions  PT Treatment Interventions DME instruction;Gait training;Stair training;Functional mobility training;Therapeutic activities;Therapeutic exercise;Balance training;Neuromuscular re-education;Patient/family education;Manual techniques;Modalities   PT Goals (Current goals can be found in the Care Plan section) Acute Rehab PT Goals Patient Stated Goal: to be safet to go home alone PT Goal Formulation: With patient Time For Goal Achievement: 07/23/15 Potential to Achieve Goals: Good    Frequency 7X/week   Barriers to discharge Decreased caregiver support pt lives alone and has not arranged for anyone to help him at discharge.        End of Session Equipment Utilized During Treatment: Gait belt Activity Tolerance: Patient limited by pain Patient left: in chair;with call bell/phone within reach           Time: 1038-1105 PT Time Calculation (min) (ACUTE ONLY): 27 min   Charges:   PT Evaluation $Initial PT Evaluation Tier I: 1 Procedure PT Treatments $Gait Training: 8-22 mins 1 Therapeutic exercise       Kaelin Holford B. Rivka Baune, PT, DPT [phone]   07/16/2015, 2:28 PM

## 2015-07-16 NOTE — Progress Notes (Signed)
Physical Therapy Treatment Patient Details Name: [name] MRN: [id number] DOB: 1966/07/04 Today's Date: 07/16/2015    History of Present Illness 49 y.o. male admitted to [facility] on 07/15/15 for elective R TKA.  Pt with significant PMHx of bipolar, depression, seizure, encepholopathy, Wilson disease, thrombocytopenia, chronic liver disease/cirrhosis, h/o ETOH abuse, DJD, CA (liver), and ORIF R tib/fib fx.    PT Comments    The pt has progressed since last session and showed improvements in ambulation, pain level, and ability to bear weight.  He was able to ambulate for a further distance without c/o pain or needing a rest break.  He seems a little confused by HEP but can perform exercises when directed to and is shown which one he is doing (according to HEP).  Plan to practice stairs tomorrow, as well as finish reviewing sitting exercises.   He was instructed to don CPM after dinner (per pt request) and to keep on for a few hours this evening.      Follow Up Recommendations  Home health PT;Supervision - Intermittent     Equipment Recommendations  Rolling walker with 5" wheels       Precautions / Restrictions Precautions Precautions: Knee Restrictions Weight Bearing Restrictions: Yes RLE Weight Bearing: Weight bearing as tolerated    Mobility  Bed Mobility Overal bed mobility: Needs Assistance Bed Mobility: Supine to Sit     Supine to sit: Supervision     General bed mobility comments: Verbal cues for L leg to assist R leg to EOB.  Pt. able to go to EOB by himself with [facility] not elevated.  Supervision for safety and technique.    Transfers Overall transfer level: Needs assistance Equipment used: Rolling walker (2 wheeled) Transfers: Sit to/from Stand Sit to Stand: Min guard         General transfer comment: Min guard for balance and control when standing.  Pt. had his personal walker which was lower than the RW used before and he said it felt much better.  Cues to put  weight on R foot once standing.    Ambulation/Gait Ambulation/Gait assistance: Min guard Ambulation Distance (Feet): 200 Feet Assistive device: Rolling walker (2 wheeled) Gait Pattern/deviations: Step-to pattern;Decreased stance time - right;Antalgic Gait velocity: decreased Gait velocity interpretation: Below normal speed for age/gender General Gait Details: Verbal cues for heel to toe gait pattern and for posture/relaxation of shoulders.  No c/o pain or fatique.       Balance Overall balance assessment: Needs assistance Sitting-balance support: Bilateral upper extremity supported;Feet supported Sitting balance-Leahy Scale: Good     Standing balance support: Bilateral upper extremity supported Standing balance-Leahy Scale: Fair Standing balance comment: RW for support                    Cognition Arousal/Alertness: Awake/alert Behavior During Therapy: WFL for tasks assessed/performed Overall Cognitive Status: Within Functional Limits for tasks assessed                      Exercises Total Joint Exercises Ankle Circles/Pumps: AROM;Both;10 reps;Seated Quad Sets: AROM;Right;10 reps;Supine Short Arc Quad: Strengthening;Right;10 reps;Supine;Other (comment) (5 sec hold) Heel Slides: AROM;AAROM;Right;10 reps;Supine Hip ABduction/ADduction: AROM;Right;10 reps;Supine Straight Leg Raises: AROM;AAROM;Right;10 reps;Supine Goniometric ROM: 12-63 (supine in recliner chair)        Pertinent Vitals/Pain Pain Assessment: No/denies pain Pain Score: 5  Pain Location: right knee Pain Descriptors / Indicators: Aching;Burning Pain Intervention(s): Limited activity within patient's tolerance;Monitored during session;Repositioned    Home  Living Family/patient expects to be discharged to:: Private residence Living Arrangements: Alone Available Help at Discharge: Friend(s);Available PRN/intermittently Type of Home: House Home Access: Stairs to enter Entrance Stairs-Rails:  Right;Left Home Layout: One level Home Equipment: Tub bench;Bedside commode;Cane - single point      Prior Function Level of Independence: Independent          PT Goals (current goals can now be found in the care plan section) Acute Rehab PT Goals Patient Stated Goal: to be safe to go home alone PT Goal Formulation: With patient Time For Goal Achievement: 07/23/15 Potential to Achieve Goals: Good Progress towards PT goals: Progressing toward goals    Frequency  7X/week    PT Plan Current plan remains appropriate       End of Session Equipment Utilized During Treatment: Gait belt Activity Tolerance: Patient tolerated treatment well Patient left: in chair;with call bell/phone within reach     Time: 1422-1451 PT Time Calculation (min) (ACUTE ONLY): 29 min  Charges: 1 Gait, [place], [place] - Office  07/16/2015, 3:06 PM

## 2015-07-16 NOTE — Progress Notes (Signed)
   Subjective:  Patient reports pain as moderate.    Objective:   VITALS:   Filed Vitals:   07/15/15 1805 07/15/15 1953 07/16/15 0003 07/16/15 0431  BP: 116/58 99/55 116/48 106/50  Pulse: 58 66 73 61  Temp: 97.6 F (36.4 C) 97.9 F (36.6 C) 100.5 F (38.1 C) 99.3 F (37.4 C)  TempSrc: Oral Oral Oral Oral  Resp: 14 16 16 16   Height:      Weight:      SpO2: 98% 96% 97% 94%    Neurologically intact Neurovascular intact Sensation intact distally Intact pulses distally Dorsiflexion/Plantar flexion intact Incision: dressing C/D/I and no drainage No cellulitis present Compartment soft   Lab Results  Component Value Date   WBC 6.4 07/16/2015   HGB 9.8* 07/16/2015   HCT 29.7* 07/16/2015   MCV 101.4* 07/16/2015   PLT 39* 07/16/2015     Assessment/Plan:  1 Day Post-Op   - Expected postop acute blood loss anemia - will monitor for symptoms - Up with PT/OT - DVT ppx - SCDs, ambulation, aspirin - WBAT operative extremity - HVAC removed - 1 u FFP ordered to be transfused - Pain control - Discharge planning  [name] 07/16/2015, 7:47 AM [phone]

## 2015-07-16 NOTE — Progress Notes (Signed)
Utilization review completed.  

## 2015-07-16 NOTE — Evaluation (Signed)
Occupational Therapy Evaluation Patient Details Name: [name] MRN: [id number] DOB: June 24, 1966 Today's Date: 07/16/2015    History of Present Illness 49 y.o. male admitted to [facility] on 07/15/15 for elective R TKA.  Pt with significant PMHx of bipolar, depression, seizure, encepholopathy, Wilson disease, thrombocytopenia, chronic liver disease/cirrhosis, h/o ETOH abuse, DJD, CA (liver), and ORIF R tib/fib fx.   Clinical Impression   Pt admitted to hospital due to reason stated above. Pt currently with functional limitiations due to the deficits listed below (see OT problem list). Prior to admission pt was independent with ADLs. Pt currently requires supervision to min guard assistance for ADLs. Pt will benefit from skilled OT to increase his independence and safety with ADLs and balance to allow safe discharge home alone.    Follow Up Recommendations  Supervision - Intermittent    Equipment Recommendations  None recommended by OT    Recommendations for Other Services       Precautions / Restrictions Precautions Precautions: Knee;Fall Restrictions Weight Bearing Restrictions: Yes RLE Weight Bearing: Weight bearing as tolerated      Mobility Bed Mobility Overal bed mobility: Modified Independent Bed Mobility: Supine to Sit     Supine to sit: Supervision     General bed mobility comments: Pt used RUE to lift RLE off of bed. pt requires increase time to complete bed mobility  Transfers Overall transfer level: Needs assistance Equipment used: Rolling walker (2 wheeled) Transfers: Sit to/from Stand Sit to Stand: Min guard         General transfer comment: verbal cues for sequence and hand placement    Balance Overall balance assessment: Needs assistance Sitting-balance support: No upper extremity supported;Feet supported Sitting balance-Leahy Scale: Good     Standing balance support: Bilateral upper extremity supported Standing balance-Leahy Scale:  Fair Standing balance comment: requires UE support from rolling walker                            ADL Overall ADL's : Needs assistance/impaired     Grooming: Wash/dry hands;Wash/dry face;Oral care;Supervision/safety;Sitting Grooming Details (indicate cue type and reason): pt also shave while sitting at sink with supervision for safety                 Toilet Transfer: Min guard;Ambulation;RW;BSC Toilet Transfer Details (indicate cue type and reason): BSC positioned over toilet       Tub/Shower Transfer Details (indicate cue type and reason): pt reprots having tub transfer bench to assist with transfer safety Functional mobility during ADLs: Min guard;Rolling walker General ADL Comments: Pt educated in adaptive equipment to assist with LB dressing and bathing. Pt reports having sock aid from previous therapy     Vision     Perception     Praxis      Pertinent Vitals/Pain Pain Assessment: No/denies pain Pain Score: 5  Pain Location: right knee Pain Descriptors / Indicators: Aching;Burning Pain Intervention(s): Premedicated before session     Hand Dominance Right   Extremity/Trunk Assessment Upper Extremity Assessment Upper Extremity Assessment: Overall WFL for tasks assessed   Lower Extremity Assessment Lower Extremity Assessment: Defer to PT evaluation   Cervical / Trunk Assessment Cervical / Trunk Assessment: Normal   Communication Communication Communication: No difficulties   Cognition Arousal/Alertness: Awake/alert Behavior During Therapy: WFL for tasks assessed/performed Overall Cognitive Status: Within Functional Limits for tasks assessed  General Comments       Exercises       Shoulder Instructions      Home Living Family/patient expects to be discharged to:: Private residence Living Arrangements: Alone Available Help at Discharge: Friend(s);Available PRN/intermittently Type of Home: House Home  Access: Stairs to enter [employer] of Steps: 3 Entrance Stairs-Rails: Right;Left Home Layout: One level     Bathroom Shower/Tub: Teacher, early years/pre: Standard Bathroom Accessibility: Yes (walker if you turn the wheels to the inside)   Home Equipment: Tub bench;Bedside commode;Cane - single point;Adaptive equipment Adaptive Equipment: Sock aid        Prior Functioning/Environment Level of Independence: Independent             OT Diagnosis: Generalized weakness;Acute pain   OT Problem List: Decreased strength;Decreased activity tolerance;Impaired balance (sitting and/or standing);Decreased knowledge of use of DME or AE;Pain   OT Treatment/Interventions: Self-care/ADL training;Therapeutic exercise;DME and/or AE instruction;Therapeutic activities;Patient/family education;Balance training    OT Goals(Current goals can be found in the care plan section) Acute Rehab OT Goals Patient Stated Goal: to be able to ride my bike again OT Goal Formulation: With patient Time For Goal Achievement: 07/30/15 Potential to Achieve Goals: Good ADL Goals Pt Will Perform Grooming: with modified independence;standing Pt Will Perform Lower Body Bathing: with modified independence;with adaptive equipment;sit to/from stand Pt Will Perform Lower Body Dressing: with modified independence;with supervision;with adaptive equipment;sit to/from stand Pt Will Transfer to Toilet: with modified independence;ambulating;bedside commode (BSC over toilet) Pt Will Perform Toileting - Clothing Manipulation and hygiene: with modified independence;with adaptive equipment;sit to/from stand (educate on toileting aid) Pt Will Perform Tub/Shower Transfer: Tub transfer;with modified independence;ambulating;rolling walker;tub bench  OT Frequency: Min 2X/week   Barriers to D/C:            Co-evaluation              End of Session Equipment Utilized During Treatment: Gait belt;Rolling  walker  Activity Tolerance: Patient tolerated treatment well Patient left: in bed;with call bell/phone within reach   Time: 1320-1357 OT Time Calculation (min): 37 min Charges:  OT General Charges $OT Visit: 1 Procedure OT Evaluation $Initial OT Evaluation Tier I: 1 Procedure OT Treatments $Self Care/Home Management : 8-22 mins G-Codes:    Lin Landsman 07/21/15, 2:05 PM

## 2015-07-16 NOTE — Care Management Note (Signed)
Case Management Note  Patient Details  Name: [name] MRN: [id number] Date of Birth: 07-15-1966  Subjective/Objective:  49 yr old male s/p right total knee arthroplasty.                  Action/Plan:   Case manager spoke with patient concerning home health and DME needs at discharge. Choice was offered for [facility]. Patient stated he used [facility] in the past and prefers to use them again. Referral was called to [place], [facility]. Patient has a 3in1, rolling walker will be ordered. He will have assistance at discharge.    Expected Discharge Date:     07/17/15             Expected Discharge Plan:   Home with Home Health  In-House Referral:  NA  Discharge planning Services  CM Consult  Post Acute Care Choice:  Durable Medical Equipment, Home Health Choice offered to:  Patient  DME Arranged:  Walker rolling DME Agency:  [place]:  PT [facility] Agency:  [facility]  Status of Service:  Completed, signed off  Medicare Important Message Given:    Date Medicare IM Given:    Medicare IM give by:    Date Additional Medicare IM Given:    Additional Medicare Important Message give by:     If discussed at [place] of Stay Meetings, dates discussed:    Additional Comments:  Ninfa Meeker, RN 07/16/2015, 1:45 PM

## 2015-07-17 LAB — CBC
HCT: 28.6 % — ABNORMAL LOW (ref 39.0–52.0)
Hemoglobin: 9.6 g/dL — ABNORMAL LOW (ref 13.0–17.0)
MCH: 34.2 pg — ABNORMAL HIGH (ref 26.0–34.0)
MCHC: 33.6 g/dL (ref 30.0–36.0)
MCV: 101.8 fL — ABNORMAL HIGH (ref 78.0–100.0)
Platelets: 26 10*3/uL — CL (ref 150–400)
RBC: 2.81 MIL/uL — ABNORMAL LOW (ref 4.22–5.81)
RDW: 15.5 % (ref 11.5–15.5)
WBC: 4.3 10*3/uL (ref 4.0–10.5)

## 2015-07-17 LAB — PREPARE FRESH FROZEN PLASMA: Unit division: 0

## 2015-07-17 NOTE — Progress Notes (Signed)
Subjective: 2 Days Post-Op Procedure(s) (LRB): RIGHT TOTAL KNEE ARTHROPLASTY (Right) Patient reports pain as mild to moderate.  States he has low blood pressure at home and has to be careful getting up slowly. No complaints otherwise. No nose or gum bleeding.  Objective: Vital signs in last 24 hours: Temp:  [98.1 F (36.7 C)-99.1 F (37.3 C)] 99.1 F (37.3 C) (11/12 0605) Pulse Rate:  [60-74] 74 (11/12 0605) Resp:  [14-17] 16 (11/12 0605) BP: (99-114)/(44-53) 114/44 mmHg (11/12 0605) SpO2:  [92 %-94 %] 92 % (11/12 0605)  Intake/Output from previous day: 11/11 0701 - 11/12 0700 In: 1440 [P.O.:1200; Blood:240] Out: 250 [Urine:250] Intake/Output this shift:     Recent Labs  07/16/15 0523 07/16/15 1621 07/17/15 0650  HGB 9.8* 9.5* 9.6*    Recent Labs  07/16/15 1621 07/17/15 0650  WBC 5.5 4.3  RBC 2.79* 2.81*  HCT 28.3* 28.6*  PLT 36* 26*    Recent Labs  07/16/15 0523  NA 134*  K 4.3  CL 109  CO2 21*  BUN 11  CREATININE 0.75  GLUCOSE 97  CALCIUM 8.2*   No results for input(s): LABPT, INR in the last 72 hours.  Sensation intact distally Intact pulses distally Incision: no drainage Compartment soft  Assessment/Plan: 2 Days Post-Op Procedure(s) (LRB): RIGHT TOTAL KNEE ARTHROPLASTY (Right) Up with therapy  Possible discharge to home later today.  Hgb beginning to to stabilize.  Thrombocytopenia chronic on acute has received FFP X 2 units this hospitalization .  Erskine Emery 07/17/2015, 9:53 AM

## 2015-07-17 NOTE — Progress Notes (Signed)
CRITICAL VALUE ALERT  Critical value received:  Platelets 26  Date of notification:  07/17/15  Time of notification:  0828  Critical value read back: yes  Nurse who received alert:  Azzie Glatter, RN  MD notified (1st page):  Dr. Ninfa Linden  Time of first page:  09:03 AM  MD notified (2nd page): n/a  Time of second page: n/a  Responding MD:  Dr. Nelva Nay  Time MD responded:  09:05 AM  No new orders at this time.

## 2015-07-17 NOTE — Progress Notes (Signed)
Physical Therapy Treatment Patient Details Name: [name] MRN: [id number] DOB: 12-27-1965 Today's Date: 07/17/2015    History of Present Illness 49 y.o. male admitted to [facility] on 07/15/15 for elective R TKA.  Pt with significant PMHx of bipolar, depression, seizure, encepholopathy, Wilson disease, thrombocytopenia, chronic liver disease/cirrhosis, h/o ETOH abuse, DJD, CA (liver), and ORIF R tib/fib fx.    PT Comments    Pt making steady progress toward goals. Completed stair education this session. Acute PT to continue during pt's hospital stay.   Follow Up Recommendations  Home health PT;Supervision - Intermittent     Equipment Recommendations  Rolling walker with 5" wheels       Precautions / Restrictions Precautions Precautions: Knee Restrictions Weight Bearing Restrictions: Yes RLE Weight Bearing: Weight bearing as tolerated    Mobility  Bed Mobility Overal bed mobility: Needs Assistance Bed Mobility: Supine to Sit     Supine to sit: Supervision     General bed mobility comments: pt used a belt to advance his right leg to and off the edge of the bed. then he was able to tuck his left leg under the right leg for support so to use his UE's to scoot the remainder of the distance to the edge of the bed. cues for sequenciing and technique provided throughout.                              Transfers   Equipment used: Rolling walker (2 wheeled) Transfers: Sit to/from Stand Sit to Stand: [employer] transfer comment: performed multiple reps through out session with less assistance needed over time and less cues. intially needed cues on hand position and for right leg position with transfers.  Ambulation/Gait Ambulation/Gait assistance: Min guard;Supervision Ambulation Distance (Feet): 300 Feet Assistive device: Rolling walker (2 wheeled) Gait Pattern/deviations: Step-to pattern;Step-through pattern;Antalgic;Narrow base of support Gait  velocity: decreased Gait velocity interpretation: Below normal speed for age/gender General Gait Details: pt intially not placing any weight on right leg (holding it off ground and hopping). with cues pt gradually increased weight bearing on the right leg and then progressed from step to gait pattern to a step through gait pattern.   Stairs Stairs: Yes Stairs assistance: Min assist Stair Management: Backwards;With walker;Step to pattern Number of Stairs: 5 General stair comments: pt's home set up has 2 storm doors that open out and subsequently will block his rail access on both sides at the top. Pt shown and practice the method of using a walker to go up backwards/down forwards with someone else stabilizing the walker.                                         Cognition Arousal/Alertness: Awake/alert Behavior During Therapy: WFL for tasks assessed/performed Overall Cognitive Status: Within Functional Limits for tasks assessed          Pertinent Vitals/Pain Pain Assessment: 0-10 Pain Score: 6  Pain Location: right knee Pain Descriptors / Indicators: Aching;Sore;Discomfort Pain Intervention(s): Premedicated before session;Monitored during session;Repositioned     PT Goals (current goals can now be found in the care plan section) Acute Rehab PT Goals Patient Stated Goal: to be safe to go home alone PT Goal Formulation: With patient Time For Goal Achievement: 07/23/15 Potential to Achieve Goals: Good Progress towards  PT goals: Progressing toward goals    Frequency  7X/week    PT Plan Current plan remains appropriate    End of Session Equipment Utilized During Treatment: Gait belt Activity Tolerance: Patient tolerated treatment well Patient left: in chair;with nursing/sitter in room;with call bell/phone within reach     Time: 1010-1050 PT Time Calculation (min) (ACUTE ONLY): 40 min  Charges:  $Gait Training: 23-37 mins $Therapeutic Activity: 8-22 mins                      Willow Ora 07/17/2015, 11:01 AM   Willow Ora, PTA, CLT Acute Rehab Services [phone] 07/17/2015, 11:02 AM

## 2015-07-17 NOTE — Discharge Summary (Signed)
Patient ID: [name] MRN: [id number] DOB/AGE: 51-Jun-1967 51 y.o.  Admit date: 07/15/2015 Discharge date: 07/17/2015  Admission Diagnoses:  Active Problems:   S/P total knee replacement using cement   Discharge Diagnoses:  Same  Past Medical History  Diagnosis Date  . Hernia   . Bipolar affective disorder ([place])     [place]  . Depression   . Seizures ([place])     Dr Jannifer Franklin  . Abuse, drug or alcohol   . Wilson disease   . Encephalopathy   . Thrombocytopenia ([place])   . Chronic liver disease and cirrhosis   . History of alcohol abuse   . Dyslipidemia   . PONV (postoperative nausea and vomiting)   . DJD (degenerative joint disease)     right knee  . Cancer [facility])     liver cancer treated with micrablation  . Esophageal varices (H[facility])     Surgeries: Procedure(s): RIGHT TOTAL KNEE ARTHROPLASTY on 07/15/2015   Consultants:  PT/OT  Discharged Condition: Improved  Hospital Course: EMRIC NICOLO is an 49 y.o. male who was admitted 07/15/2015 for operative treatment of<principal problem not specified>. Patient has severe unremitting pain that affects sleep, daily activities, and work/hobbies. After pre-op clearance the patient was taken to the operating room on 07/15/2015 and underwent  Procedure(s): RIGHT TOTAL KNEE ARTHROPLASTY.    Patient was given perioperative antibiotics: Anti-infectives    Start     Dose/Rate Route Frequency Ordered Stop   07/15/15 2000  ceFAZolin (ANCEF) IVPB 2 g/50 mL premix     2 g 100 mL/hr over 30 Minutes Intravenous Every 6 hours 07/15/15 1819 07/16/15 0328   07/15/15 1200  ceFAZolin (ANCEF) IVPB 2 g/50 mL premix     2 g 100 mL/hr over 30 Minutes Intravenous To ShortStay Surgical 07/14/15 1336 07/15/15 1355       Patient was given sequential compression devices, early ambulation, and chemoprophylaxis to prevent DVT.  Patient benefited maximally from hospital stay and there were no complications.    Recent vital  signs: Patient Vitals for the past 24 hrs:  BP Temp Temp src Pulse Resp SpO2  07/17/15 0905 (!) 133/48 mmHg 99.2 F (37.3 C) Oral 71 16 94 %  07/17/15 0605 (!) 114/44 mmHg 99.1 F (37.3 C) Oral 74 16 92 %  07/16/15 2055 (!) 104/44 mmHg 98.1 F (36.7 C) Oral 66 17 94 %  07/16/15 1158 (!) 106/52 mmHg 98.2 F (36.8 C) Oral 60 14 94 %  07/16/15 1040 (!) 102/53 mmHg - - 63 - 94 %  07/16/15 1018 (!) 99/51 mmHg 98.4 F (36.9 C) Oral 62 14 93 %     Recent laboratory studies:  Recent Labs  07/16/15 0523 07/16/15 1621 07/17/15 0650  WBC 6.4 5.5 4.3  HGB 9.8* 9.5* 9.6*  HCT 29.7* 28.3* 28.6*  PLT 39* 36* 26*  NA 134*  --   --   K 4.3  --   --   CL 109  --   --   CO2 21*  --   --   BUN 11  --   --   CREATININE 0.75  --   --   GLUCOSE 97  --   --   CALCIUM 8.2*  --   --      Discharge Medications:     Medication List    TAKE these medications        acetaminophen 500 MG tablet  Commonly known as:  TYLENOL  Take 500 mg by mouth every 6 (six) hours as needed (pain). Take 3 tabs as needed for pain.     aspirin EC 325 MG tablet  Take 1 tablet (325 mg total) by mouth 2 (two) times daily.     furosemide 20 MG tablet  Commonly known as:  LASIX  Take 60 mg by mouth 2 (two) times daily.     lactulose 10 G packet  Commonly known as:  CEPHULAC  Take 10 g by mouth daily as needed (toxin buildup).     levETIRAcetam 1000 MG tablet  Commonly known as:  KEPPRA  Take 1 tablet (1,000 mg total) by mouth 2 (two) times daily.     MILK THISTLE PO  Take 1 tablet by mouth 2 (two) times daily.     oxyCODONE 5 MG immediate release tablet  Commonly known as:  Oxy IR/ROXICODONE  Take 1-3 tablets (5-15 mg total) by mouth every 4 (four) hours as needed.     oxyCODONE 10 mg 12 hr tablet  Commonly known as:  OXYCONTIN  Take 1 tablet (10 mg total) by mouth every 12 (twelve) hours.     oxyCODONE 5 MG immediate release tablet  Commonly known as:  Oxy IR/ROXICODONE  Take 1-3 tablets (5-15 mg  total) by mouth every 4 (four) hours as needed.     potassium chloride 10 MEQ tablet  Commonly known as:  K-DUR  TAKE 1 TABLET BY MOUTH TWICE DAILY     propranolol 10 MG tablet  Commonly known as:  INDERAL  Take 10 mg by mouth 2 (two) times daily.     spironolactone 100 MG tablet  Commonly known as:  ALDACTONE  Take 50-100 mg by mouth 2 (two) times daily. 100 mg in the morning and 50 mg in the evening     zinc gluconate 50 MG tablet  Take 50 mg by mouth 3 (three) times daily.        Diagnostic Studies: Dg Knee Right Port  07/15/2015  CLINICAL DATA:  Status post right total knee EXAM: PORTABLE RIGHT KNEE - 1-2 VIEW COMPARISON:  None. FINDINGS: Postoperative changes compatible with right total knee arthroplasty. There is a surgical drain overlying the ventral aspect of the knee joint. Gas is noted within the joint space and surrounding soft tissues. IMPRESSION: 1. Status post right total knee arthroplasty. No complications identified. Electronically Signed   By: Kerby Moors M.D.   On: 07/15/2015 16:59    Disposition: 01-Home or Self Care        Follow-up Information    Follow up with Marianna Payment, MD In 2 weeks.   Specialty:  Orthopedic Surgery   Why:  For suture removal, For wound re-check   Contact information:   [place] [address] [phone]       Follow up with [place].   Why:  Someone from [facility] will contact you to arrange time for start of therapy.   Contact information:   [address] [place] [address] [phone]        Signed: Erskine Emery 07/17/2015, 10:02 AM

## 2015-07-17 NOTE — Progress Notes (Signed)
Spoke with pt. To attempt skilled OT and transfer practice.  Pt. Dressed and ready to leave.  Declines need for review of tub transfer or any other skilled OT.  "ive been through all of this before but thank you".  Had some questions regarding how his HHPT would be scheduled.  Answered all questions.  Pt. States no further needs. Romana Juniper, COTA/L

## 2015-07-18 DIAGNOSIS — K7460 Unspecified cirrhosis of liver: Secondary | ICD-10-CM | POA: Diagnosis not present

## 2015-07-18 DIAGNOSIS — F1010 Alcohol abuse, uncomplicated: Secondary | ICD-10-CM | POA: Diagnosis not present

## 2015-07-18 DIAGNOSIS — F17210 Nicotine dependence, cigarettes, uncomplicated: Secondary | ICD-10-CM | POA: Diagnosis not present

## 2015-07-18 DIAGNOSIS — M1990 Unspecified osteoarthritis, unspecified site: Secondary | ICD-10-CM | POA: Diagnosis not present

## 2015-07-18 DIAGNOSIS — I8510 Secondary esophageal varices without bleeding: Secondary | ICD-10-CM | POA: Diagnosis not present

## 2015-07-18 DIAGNOSIS — Z471 Aftercare following joint replacement surgery: Secondary | ICD-10-CM | POA: Diagnosis not present

## 2015-07-18 DIAGNOSIS — E785 Hyperlipidemia, unspecified: Secondary | ICD-10-CM | POA: Diagnosis not present

## 2015-07-18 DIAGNOSIS — G40909 Epilepsy, unspecified, not intractable, without status epilepticus: Secondary | ICD-10-CM | POA: Diagnosis not present

## 2015-07-19 ENCOUNTER — Encounter (HOSPITAL_COMMUNITY): Payer: Self-pay | Admitting: Orthopaedic Surgery

## 2015-07-20 DIAGNOSIS — I8510 Secondary esophageal varices without bleeding: Secondary | ICD-10-CM | POA: Diagnosis not present

## 2015-07-20 DIAGNOSIS — M1990 Unspecified osteoarthritis, unspecified site: Secondary | ICD-10-CM | POA: Diagnosis not present

## 2015-07-20 DIAGNOSIS — Z471 Aftercare following joint replacement surgery: Secondary | ICD-10-CM | POA: Diagnosis not present

## 2015-07-20 DIAGNOSIS — F17210 Nicotine dependence, cigarettes, uncomplicated: Secondary | ICD-10-CM | POA: Diagnosis not present

## 2015-07-20 DIAGNOSIS — K7460 Unspecified cirrhosis of liver: Secondary | ICD-10-CM | POA: Diagnosis not present

## 2015-07-20 DIAGNOSIS — F1010 Alcohol abuse, uncomplicated: Secondary | ICD-10-CM | POA: Diagnosis not present

## 2015-07-20 DIAGNOSIS — E785 Hyperlipidemia, unspecified: Secondary | ICD-10-CM | POA: Diagnosis not present

## 2015-07-20 DIAGNOSIS — G40909 Epilepsy, unspecified, not intractable, without status epilepticus: Secondary | ICD-10-CM | POA: Diagnosis not present

## 2015-07-21 ENCOUNTER — Encounter (HOSPITAL_COMMUNITY): Payer: Self-pay | Admitting: Orthopaedic Surgery

## 2015-07-21 DIAGNOSIS — G40909 Epilepsy, unspecified, not intractable, without status epilepticus: Secondary | ICD-10-CM | POA: Diagnosis not present

## 2015-07-21 DIAGNOSIS — F17210 Nicotine dependence, cigarettes, uncomplicated: Secondary | ICD-10-CM | POA: Diagnosis not present

## 2015-07-21 DIAGNOSIS — I8510 Secondary esophageal varices without bleeding: Secondary | ICD-10-CM | POA: Diagnosis not present

## 2015-07-21 DIAGNOSIS — K7460 Unspecified cirrhosis of liver: Secondary | ICD-10-CM | POA: Diagnosis not present

## 2015-07-21 DIAGNOSIS — M1990 Unspecified osteoarthritis, unspecified site: Secondary | ICD-10-CM | POA: Diagnosis not present

## 2015-07-21 DIAGNOSIS — Z471 Aftercare following joint replacement surgery: Secondary | ICD-10-CM | POA: Diagnosis not present

## 2015-07-21 DIAGNOSIS — F1010 Alcohol abuse, uncomplicated: Secondary | ICD-10-CM | POA: Diagnosis not present

## 2015-07-21 DIAGNOSIS — E785 Hyperlipidemia, unspecified: Secondary | ICD-10-CM | POA: Diagnosis not present

## 2015-07-23 DIAGNOSIS — Z96651 Presence of right artificial knee joint: Secondary | ICD-10-CM | POA: Diagnosis not present

## 2015-07-26 DIAGNOSIS — F17210 Nicotine dependence, cigarettes, uncomplicated: Secondary | ICD-10-CM | POA: Diagnosis not present

## 2015-07-26 DIAGNOSIS — E785 Hyperlipidemia, unspecified: Secondary | ICD-10-CM | POA: Diagnosis not present

## 2015-07-26 DIAGNOSIS — K7460 Unspecified cirrhosis of liver: Secondary | ICD-10-CM | POA: Diagnosis not present

## 2015-07-26 DIAGNOSIS — I8510 Secondary esophageal varices without bleeding: Secondary | ICD-10-CM | POA: Diagnosis not present

## 2015-07-26 DIAGNOSIS — M1990 Unspecified osteoarthritis, unspecified site: Secondary | ICD-10-CM | POA: Diagnosis not present

## 2015-07-26 DIAGNOSIS — F1010 Alcohol abuse, uncomplicated: Secondary | ICD-10-CM | POA: Diagnosis not present

## 2015-07-26 DIAGNOSIS — G40909 Epilepsy, unspecified, not intractable, without status epilepticus: Secondary | ICD-10-CM | POA: Diagnosis not present

## 2015-07-26 DIAGNOSIS — Z471 Aftercare following joint replacement surgery: Secondary | ICD-10-CM | POA: Diagnosis not present

## 2015-07-27 DIAGNOSIS — M1990 Unspecified osteoarthritis, unspecified site: Secondary | ICD-10-CM | POA: Diagnosis not present

## 2015-07-27 DIAGNOSIS — E785 Hyperlipidemia, unspecified: Secondary | ICD-10-CM | POA: Diagnosis not present

## 2015-07-27 DIAGNOSIS — K7460 Unspecified cirrhosis of liver: Secondary | ICD-10-CM | POA: Diagnosis not present

## 2015-07-27 DIAGNOSIS — F17210 Nicotine dependence, cigarettes, uncomplicated: Secondary | ICD-10-CM | POA: Diagnosis not present

## 2015-07-27 DIAGNOSIS — I8510 Secondary esophageal varices without bleeding: Secondary | ICD-10-CM | POA: Diagnosis not present

## 2015-07-27 DIAGNOSIS — G40909 Epilepsy, unspecified, not intractable, without status epilepticus: Secondary | ICD-10-CM | POA: Diagnosis not present

## 2015-07-27 DIAGNOSIS — F1010 Alcohol abuse, uncomplicated: Secondary | ICD-10-CM | POA: Diagnosis not present

## 2015-07-27 DIAGNOSIS — Z471 Aftercare following joint replacement surgery: Secondary | ICD-10-CM | POA: Diagnosis not present

## 2015-07-30 DIAGNOSIS — F1010 Alcohol abuse, uncomplicated: Secondary | ICD-10-CM | POA: Diagnosis not present

## 2015-07-30 DIAGNOSIS — I8510 Secondary esophageal varices without bleeding: Secondary | ICD-10-CM | POA: Diagnosis not present

## 2015-07-30 DIAGNOSIS — E785 Hyperlipidemia, unspecified: Secondary | ICD-10-CM | POA: Diagnosis not present

## 2015-07-30 DIAGNOSIS — G40909 Epilepsy, unspecified, not intractable, without status epilepticus: Secondary | ICD-10-CM | POA: Diagnosis not present

## 2015-07-30 DIAGNOSIS — Z471 Aftercare following joint replacement surgery: Secondary | ICD-10-CM | POA: Diagnosis not present

## 2015-07-30 DIAGNOSIS — F17210 Nicotine dependence, cigarettes, uncomplicated: Secondary | ICD-10-CM | POA: Diagnosis not present

## 2015-07-30 DIAGNOSIS — M1990 Unspecified osteoarthritis, unspecified site: Secondary | ICD-10-CM | POA: Diagnosis not present

## 2015-07-30 DIAGNOSIS — K7460 Unspecified cirrhosis of liver: Secondary | ICD-10-CM | POA: Diagnosis not present

## 2015-08-02 DIAGNOSIS — R6889 Other general symptoms and signs: Secondary | ICD-10-CM | POA: Diagnosis not present

## 2015-08-02 DIAGNOSIS — T84498D Other mechanical complication of other internal orthopedic devices, implants and grafts, subsequent encounter: Secondary | ICD-10-CM | POA: Diagnosis not present

## 2015-08-02 DIAGNOSIS — K7460 Unspecified cirrhosis of liver: Secondary | ICD-10-CM | POA: Diagnosis not present

## 2015-08-02 DIAGNOSIS — Z471 Aftercare following joint replacement surgery: Secondary | ICD-10-CM | POA: Diagnosis not present

## 2015-08-02 DIAGNOSIS — M25561 Pain in right knee: Secondary | ICD-10-CM | POA: Diagnosis not present

## 2015-08-02 DIAGNOSIS — E785 Hyperlipidemia, unspecified: Secondary | ICD-10-CM | POA: Diagnosis not present

## 2015-08-02 DIAGNOSIS — F1010 Alcohol abuse, uncomplicated: Secondary | ICD-10-CM | POA: Diagnosis not present

## 2015-08-02 DIAGNOSIS — G40909 Epilepsy, unspecified, not intractable, without status epilepticus: Secondary | ICD-10-CM | POA: Diagnosis not present

## 2015-08-02 DIAGNOSIS — I8510 Secondary esophageal varices without bleeding: Secondary | ICD-10-CM | POA: Diagnosis not present

## 2015-08-02 DIAGNOSIS — F17210 Nicotine dependence, cigarettes, uncomplicated: Secondary | ICD-10-CM | POA: Diagnosis not present

## 2015-08-02 DIAGNOSIS — M1990 Unspecified osteoarthritis, unspecified site: Secondary | ICD-10-CM | POA: Diagnosis not present

## 2015-08-04 DIAGNOSIS — F1010 Alcohol abuse, uncomplicated: Secondary | ICD-10-CM | POA: Diagnosis not present

## 2015-08-04 DIAGNOSIS — G40909 Epilepsy, unspecified, not intractable, without status epilepticus: Secondary | ICD-10-CM | POA: Diagnosis not present

## 2015-08-04 DIAGNOSIS — K7460 Unspecified cirrhosis of liver: Secondary | ICD-10-CM | POA: Diagnosis not present

## 2015-08-04 DIAGNOSIS — M1990 Unspecified osteoarthritis, unspecified site: Secondary | ICD-10-CM | POA: Diagnosis not present

## 2015-08-04 DIAGNOSIS — F17210 Nicotine dependence, cigarettes, uncomplicated: Secondary | ICD-10-CM | POA: Diagnosis not present

## 2015-08-04 DIAGNOSIS — I8510 Secondary esophageal varices without bleeding: Secondary | ICD-10-CM | POA: Diagnosis not present

## 2015-08-04 DIAGNOSIS — E785 Hyperlipidemia, unspecified: Secondary | ICD-10-CM | POA: Diagnosis not present

## 2015-08-04 DIAGNOSIS — Z471 Aftercare following joint replacement surgery: Secondary | ICD-10-CM | POA: Diagnosis not present

## 2015-08-06 DIAGNOSIS — F17210 Nicotine dependence, cigarettes, uncomplicated: Secondary | ICD-10-CM | POA: Diagnosis not present

## 2015-08-06 DIAGNOSIS — G40909 Epilepsy, unspecified, not intractable, without status epilepticus: Secondary | ICD-10-CM | POA: Diagnosis not present

## 2015-08-06 DIAGNOSIS — E785 Hyperlipidemia, unspecified: Secondary | ICD-10-CM | POA: Diagnosis not present

## 2015-08-06 DIAGNOSIS — F1010 Alcohol abuse, uncomplicated: Secondary | ICD-10-CM | POA: Diagnosis not present

## 2015-08-06 DIAGNOSIS — Z471 Aftercare following joint replacement surgery: Secondary | ICD-10-CM | POA: Diagnosis not present

## 2015-08-06 DIAGNOSIS — I8510 Secondary esophageal varices without bleeding: Secondary | ICD-10-CM | POA: Diagnosis not present

## 2015-08-06 DIAGNOSIS — K7460 Unspecified cirrhosis of liver: Secondary | ICD-10-CM | POA: Diagnosis not present

## 2015-08-06 DIAGNOSIS — M1990 Unspecified osteoarthritis, unspecified site: Secondary | ICD-10-CM | POA: Diagnosis not present

## 2015-08-11 DIAGNOSIS — R2689 Other abnormalities of gait and mobility: Secondary | ICD-10-CM | POA: Diagnosis not present

## 2015-08-11 DIAGNOSIS — M25661 Stiffness of right knee, not elsewhere classified: Secondary | ICD-10-CM | POA: Diagnosis not present

## 2015-08-11 DIAGNOSIS — M6281 Muscle weakness (generalized): Secondary | ICD-10-CM | POA: Diagnosis not present

## 2015-08-11 DIAGNOSIS — M25561 Pain in right knee: Secondary | ICD-10-CM | POA: Diagnosis not present

## 2015-08-16 DIAGNOSIS — M25661 Stiffness of right knee, not elsewhere classified: Secondary | ICD-10-CM | POA: Diagnosis not present

## 2015-08-16 DIAGNOSIS — M25561 Pain in right knee: Secondary | ICD-10-CM | POA: Diagnosis not present

## 2015-08-16 DIAGNOSIS — M6281 Muscle weakness (generalized): Secondary | ICD-10-CM | POA: Diagnosis not present

## 2015-08-16 DIAGNOSIS — R2689 Other abnormalities of gait and mobility: Secondary | ICD-10-CM | POA: Diagnosis not present

## 2015-08-19 DIAGNOSIS — M25661 Stiffness of right knee, not elsewhere classified: Secondary | ICD-10-CM | POA: Diagnosis not present

## 2015-08-19 DIAGNOSIS — M6281 Muscle weakness (generalized): Secondary | ICD-10-CM | POA: Diagnosis not present

## 2015-08-19 DIAGNOSIS — R2689 Other abnormalities of gait and mobility: Secondary | ICD-10-CM | POA: Diagnosis not present

## 2015-08-19 DIAGNOSIS — M25561 Pain in right knee: Secondary | ICD-10-CM | POA: Diagnosis not present

## 2015-08-23 DIAGNOSIS — M25661 Stiffness of right knee, not elsewhere classified: Secondary | ICD-10-CM | POA: Diagnosis not present

## 2015-08-23 DIAGNOSIS — R2689 Other abnormalities of gait and mobility: Secondary | ICD-10-CM | POA: Diagnosis not present

## 2015-08-23 DIAGNOSIS — M25561 Pain in right knee: Secondary | ICD-10-CM | POA: Diagnosis not present

## 2015-08-23 DIAGNOSIS — M6281 Muscle weakness (generalized): Secondary | ICD-10-CM | POA: Diagnosis not present

## 2015-08-26 DIAGNOSIS — M6281 Muscle weakness (generalized): Secondary | ICD-10-CM | POA: Diagnosis not present

## 2015-08-26 DIAGNOSIS — M25661 Stiffness of right knee, not elsewhere classified: Secondary | ICD-10-CM | POA: Diagnosis not present

## 2015-08-26 DIAGNOSIS — R2689 Other abnormalities of gait and mobility: Secondary | ICD-10-CM | POA: Diagnosis not present

## 2015-08-26 DIAGNOSIS — M25561 Pain in right knee: Secondary | ICD-10-CM | POA: Diagnosis not present

## 2015-08-31 DIAGNOSIS — M6281 Muscle weakness (generalized): Secondary | ICD-10-CM | POA: Diagnosis not present

## 2015-08-31 DIAGNOSIS — M25661 Stiffness of right knee, not elsewhere classified: Secondary | ICD-10-CM | POA: Diagnosis not present

## 2015-08-31 DIAGNOSIS — R2689 Other abnormalities of gait and mobility: Secondary | ICD-10-CM | POA: Diagnosis not present

## 2015-08-31 DIAGNOSIS — M25561 Pain in right knee: Secondary | ICD-10-CM | POA: Diagnosis not present

## 2015-09-03 DIAGNOSIS — M25561 Pain in right knee: Secondary | ICD-10-CM | POA: Diagnosis not present

## 2015-09-03 DIAGNOSIS — R2689 Other abnormalities of gait and mobility: Secondary | ICD-10-CM | POA: Diagnosis not present

## 2015-09-03 DIAGNOSIS — M25661 Stiffness of right knee, not elsewhere classified: Secondary | ICD-10-CM | POA: Diagnosis not present

## 2015-09-03 DIAGNOSIS — M6281 Muscle weakness (generalized): Secondary | ICD-10-CM | POA: Diagnosis not present

## 2015-09-07 DIAGNOSIS — R2689 Other abnormalities of gait and mobility: Secondary | ICD-10-CM | POA: Diagnosis not present

## 2015-09-07 DIAGNOSIS — M25561 Pain in right knee: Secondary | ICD-10-CM | POA: Diagnosis not present

## 2015-09-07 DIAGNOSIS — M25661 Stiffness of right knee, not elsewhere classified: Secondary | ICD-10-CM | POA: Diagnosis not present

## 2015-09-07 DIAGNOSIS — M6281 Muscle weakness (generalized): Secondary | ICD-10-CM | POA: Diagnosis not present

## 2015-09-10 DIAGNOSIS — R2689 Other abnormalities of gait and mobility: Secondary | ICD-10-CM | POA: Diagnosis not present

## 2015-09-10 DIAGNOSIS — M25561 Pain in right knee: Secondary | ICD-10-CM | POA: Diagnosis not present

## 2015-09-10 DIAGNOSIS — M25661 Stiffness of right knee, not elsewhere classified: Secondary | ICD-10-CM | POA: Diagnosis not present

## 2015-09-10 DIAGNOSIS — M6281 Muscle weakness (generalized): Secondary | ICD-10-CM | POA: Diagnosis not present

## 2015-09-17 DIAGNOSIS — M6281 Muscle weakness (generalized): Secondary | ICD-10-CM | POA: Diagnosis not present

## 2015-09-17 DIAGNOSIS — M25661 Stiffness of right knee, not elsewhere classified: Secondary | ICD-10-CM | POA: Diagnosis not present

## 2015-09-17 DIAGNOSIS — R2689 Other abnormalities of gait and mobility: Secondary | ICD-10-CM | POA: Diagnosis not present

## 2015-09-17 DIAGNOSIS — M25561 Pain in right knee: Secondary | ICD-10-CM | POA: Diagnosis not present

## 2015-09-20 ENCOUNTER — Ambulatory Visit: Payer: Commercial Managed Care - HMO | Admitting: Nurse Practitioner

## 2015-09-20 DIAGNOSIS — M6281 Muscle weakness (generalized): Secondary | ICD-10-CM | POA: Diagnosis not present

## 2015-09-20 DIAGNOSIS — M25561 Pain in right knee: Secondary | ICD-10-CM | POA: Diagnosis not present

## 2015-09-20 DIAGNOSIS — M25661 Stiffness of right knee, not elsewhere classified: Secondary | ICD-10-CM | POA: Diagnosis not present

## 2015-09-20 DIAGNOSIS — R2689 Other abnormalities of gait and mobility: Secondary | ICD-10-CM | POA: Diagnosis not present

## 2015-09-23 DIAGNOSIS — M25561 Pain in right knee: Secondary | ICD-10-CM | POA: Diagnosis not present

## 2015-09-23 DIAGNOSIS — M25661 Stiffness of right knee, not elsewhere classified: Secondary | ICD-10-CM | POA: Diagnosis not present

## 2015-09-23 DIAGNOSIS — R2689 Other abnormalities of gait and mobility: Secondary | ICD-10-CM | POA: Diagnosis not present

## 2015-09-23 DIAGNOSIS — M6281 Muscle weakness (generalized): Secondary | ICD-10-CM | POA: Diagnosis not present

## 2015-09-24 DIAGNOSIS — C220 Liver cell carcinoma: Secondary | ICD-10-CM | POA: Diagnosis not present

## 2015-09-24 DIAGNOSIS — Z9889 Other specified postprocedural states: Secondary | ICD-10-CM | POA: Diagnosis not present

## 2015-09-24 DIAGNOSIS — K7460 Unspecified cirrhosis of liver: Secondary | ICD-10-CM | POA: Diagnosis not present

## 2015-09-24 DIAGNOSIS — K766 Portal hypertension: Secondary | ICD-10-CM | POA: Diagnosis not present

## 2015-09-27 DIAGNOSIS — M6281 Muscle weakness (generalized): Secondary | ICD-10-CM | POA: Diagnosis not present

## 2015-09-27 DIAGNOSIS — M25661 Stiffness of right knee, not elsewhere classified: Secondary | ICD-10-CM | POA: Diagnosis not present

## 2015-09-27 DIAGNOSIS — R2689 Other abnormalities of gait and mobility: Secondary | ICD-10-CM | POA: Diagnosis not present

## 2015-09-27 DIAGNOSIS — M25561 Pain in right knee: Secondary | ICD-10-CM | POA: Diagnosis not present

## 2015-09-29 DIAGNOSIS — K7469 Other cirrhosis of liver: Secondary | ICD-10-CM | POA: Diagnosis not present

## 2015-09-29 DIAGNOSIS — Z96651 Presence of right artificial knee joint: Secondary | ICD-10-CM | POA: Diagnosis not present

## 2015-09-29 DIAGNOSIS — Z79899 Other long term (current) drug therapy: Secondary | ICD-10-CM | POA: Diagnosis not present

## 2015-09-29 DIAGNOSIS — K7460 Unspecified cirrhosis of liver: Secondary | ICD-10-CM | POA: Diagnosis not present

## 2015-09-29 DIAGNOSIS — F1010 Alcohol abuse, uncomplicated: Secondary | ICD-10-CM | POA: Diagnosis not present

## 2015-09-29 DIAGNOSIS — Z885 Allergy status to narcotic agent status: Secondary | ICD-10-CM | POA: Diagnosis not present

## 2015-09-29 DIAGNOSIS — F17210 Nicotine dependence, cigarettes, uncomplicated: Secondary | ICD-10-CM | POA: Diagnosis not present

## 2015-10-01 DIAGNOSIS — M25561 Pain in right knee: Secondary | ICD-10-CM | POA: Diagnosis not present

## 2015-10-01 DIAGNOSIS — M25661 Stiffness of right knee, not elsewhere classified: Secondary | ICD-10-CM | POA: Diagnosis not present

## 2015-10-01 DIAGNOSIS — M6281 Muscle weakness (generalized): Secondary | ICD-10-CM | POA: Diagnosis not present

## 2015-10-01 DIAGNOSIS — R2689 Other abnormalities of gait and mobility: Secondary | ICD-10-CM | POA: Diagnosis not present

## 2015-10-04 DIAGNOSIS — R2689 Other abnormalities of gait and mobility: Secondary | ICD-10-CM | POA: Diagnosis not present

## 2015-10-04 DIAGNOSIS — M25661 Stiffness of right knee, not elsewhere classified: Secondary | ICD-10-CM | POA: Diagnosis not present

## 2015-10-04 DIAGNOSIS — M25561 Pain in right knee: Secondary | ICD-10-CM | POA: Diagnosis not present

## 2015-10-04 DIAGNOSIS — M6281 Muscle weakness (generalized): Secondary | ICD-10-CM | POA: Diagnosis not present

## 2015-10-11 ENCOUNTER — Ambulatory Visit (INDEPENDENT_AMBULATORY_CARE_PROVIDER_SITE_OTHER): Payer: Commercial Managed Care - HMO | Admitting: Nurse Practitioner

## 2015-10-11 ENCOUNTER — Encounter: Payer: Self-pay | Admitting: Nurse Practitioner

## 2015-10-11 VITALS — BP 93/53 | HR 50 | Ht 72.0 in | Wt 168.4 lb

## 2015-10-11 DIAGNOSIS — R569 Unspecified convulsions: Secondary | ICD-10-CM | POA: Diagnosis not present

## 2015-10-11 DIAGNOSIS — K7460 Unspecified cirrhosis of liver: Secondary | ICD-10-CM

## 2015-10-11 DIAGNOSIS — K769 Liver disease, unspecified: Secondary | ICD-10-CM

## 2015-10-11 MED ORDER — LEVETIRACETAM 1000 MG PO TABS
1000.0000 mg | ORAL_TABLET | Freq: Two times a day (BID) | ORAL | Status: DC
Start: 2015-10-11 — End: 2016-10-30

## 2015-10-11 NOTE — Patient Instructions (Signed)
Continue Keppra current dose will refill Follow-up yearly and when necessary Call for any seizure activity

## 2015-10-11 NOTE — Progress Notes (Signed)
I have read the note, and I agree with the clinical assessment and plan.  Kimya Mccahill KEITH   

## 2015-10-11 NOTE — Progress Notes (Signed)
[facility]  PATIENT: [name] DOB: 18-Jan-1966   REASON FOR VISIT: follow up for seizure disorder  HISTORY FROM:patient    HISTORY OF PRESENT ILLNESS:Mr. Neuser is a 50 year old right-handed white male with a history of Wilson's disease and a history of alcohol abuse. The patient has epilepsy, and he has been treated with Keppra taking 1000 mg twice daily. He has done quite well since last seen 09/16/14.  He has not operated a motor vehicle for the last 12 years once he lost his driver's license with a DUI. He no longer consumes alcohol. He recently had a right knee replacement and is currently getting rehabilitation. He  is tolerating the Keppra well, he has not had any issues with drowsiness or irritability. He returns for an evaluation.   REVIEW OF SYSTEMS: Full 14 system review of systems performed and notable only for those listed, all others are neg:  Constitutional: neg  Cardiovascular: neg Ear/Nose/Throat: neg  Skin: neg Eyes: neg Respiratory: neg Gastroitestinal: neg  Hematology/Lymphatic: neg  Endocrine: neg Musculoskeletal:neg Allergy/Immunology: neg Neurological: neg Psychiatric: neg Sleep : neg   ALLERGIES: Allergies  Allergen Reactions  . Codeine Nausea And Vomiting    Passed out, was also drinking at the time    HOME MEDICATIONS: Outpatient Prescriptions Prior to Visit  Medication Sig Dispense Refill  . acetaminophen (TYLENOL) 500 MG tablet Take 500 mg by mouth every 6 (six) hours as needed (pain). Take 3 tabs as needed for pain.    . furosemide (LASIX) 20 MG tablet Take 60 mg by mouth 2 (two) times daily.    [name] lactulose (CEPHULAC) 10 G packet Take 10 g by mouth daily as needed (toxin buildup).     [name] levETIRAcetam (KEPPRA) 1000 MG tablet Take 1 tablet (1,000 mg total) by mouth 2 (two) times daily. 180 tablet 3  . MILK THISTLE PO Take 1 tablet by mouth 2 (two) times daily.    . potassium chloride (K-DUR) 10 MEQ tablet TAKE 1  TABLET BY MOUTH TWICE DAILY 60 tablet 11  . propranolol (INDERAL) 10 MG tablet Take 10 mg by mouth 2 (two) times daily.     [name] spironolactone (ALDACTONE) 100 MG tablet Take 50-100 mg by mouth 2 (two) times daily. 100 mg in the morning and 50 mg in the evening    . zinc gluconate 50 MG tablet Take 50 mg by mouth 3 (three) times daily.     [name] aspirin EC 325 MG tablet Take 1 tablet (325 mg total) by mouth 2 (two) times daily. 84 tablet 0  . oxyCODONE (OXY IR/ROXICODONE) 5 MG immediate release tablet Take 1-3 tablets (5-15 mg total) by mouth every 4 (four) hours as needed. (Patient taking differently: Take 5-15 mg by mouth every 4 (four) hours as needed for severe pain. ) 90 tablet 0  . oxyCODONE (OXY IR/ROXICODONE) 5 MG immediate release tablet Take 1-3 tablets (5-15 mg total) by mouth every 4 (four) hours as needed. 90 tablet 0  . oxyCODONE (OXYCONTIN) 10 mg 12 hr tablet Take 1 tablet (10 mg total) by mouth every 12 (twelve) hours. 10 tablet 0   No facility-administered medications prior to visit.    PAST MEDICAL HISTORY: Past Medical History  Diagnosis Date  . Hernia   . Bipolar affective disorder ([place])     [place]  . Depression   . Seizures ([place])     Dr Jannifer Franklin  . Abuse, drug or alcohol   . [place]  disease   . Encephalopathy   . Thrombocytopenia ([place])   . Chronic liver disease and cirrhosis   . History of alcohol abuse   . Dyslipidemia   . PONV (postoperative nausea and vomiting)   . DJD (degenerative joint disease)     right knee  . Cancer [facility])     liver cancer treated with micrablation  . Esophageal varices ([place])     PAST SURGICAL HISTORY: Past Surgical History  Procedure Laterality Date  . Left inguinal hernia  june 2012    [place]  . Vascular surgery    . Open reduction internal fixation (orif) tibia/fibula fracture Right 07/25/2013    Procedure: OPEN REDUCTION INTERNAL FIXATION (ORIF) RIGHT TIBIAL PLATEAU, TIBIAL SHAFT AND FIBULA FRACTURES,  POSSIBLE FASCIOTOMIES;  Surgeon: Marianna Payment, MD;  Location: [facility];  Service: Orthopedics;  Laterality: Right;  . Hardware removal Right 01/22/2015    Procedure: HARDWARE REMOVAL RIGHT LEG;  Surgeon: Leandrew Koyanagi, MD;  Location: [facility];  Service: Orthopedics;  Laterality: Right;  . Microablation of liver    . Esophagogastroduodenoscopy    . Total knee arthroplasty Right 07/15/2015    Procedure: RIGHT TOTAL KNEE ARTHROPLASTY;  Surgeon: Leandrew Koyanagi, MD;  Location: [facility];  Service: Orthopedics;  Laterality: Right;  . Liver cancer  02/2015    ablation    FAMILY HISTORY: Family History  Problem Relation Age of Onset  . Heart disease Other   . Diabetes Father   . Dementia Mother   . Glaucoma Brother     SOCIAL HISTORY: Social History   Social History  . Marital Status: Single    Spouse Name: N/A  . Number of Children: 2  . Years of Education: HS   Occupational History  . disabled    Social History Main Topics  . Smoking status: Current Every Day Smoker -- 0.25 packs/day    Types: Cigarettes  . Smokeless tobacco: Never Used  . Alcohol Use: No     Comment: 3-4 bottles a week, quit drinking 2005  . Drug Use: No     Comment: last use 2005  . Sexual Activity: Yes   Other Topics Concern  . Not on file   Social History Narrative   Patient is right handed.   Patient drinks some caffeine daily.     PHYSICAL EXAM  Filed Vitals:   10/11/15 1252  BP: 93/53  Pulse: 50  Height: 6' (1.829 m)  Weight: 168 lb 6.4 oz (76.386 kg)   Body mass index is 22.83 kg/(m^2). General: The patient is alert and cooperative at the time of the examination. Skin: No significant peripheral edema is noted.  Neurologic Exam Mental status: The patient is oriented x 3. Cranial nerves: Facial symmetry is present. Speech is normal, no aphasia or dysarthria is noted. Extraocular movements are full. Visual fields are full. Motor: The patient has good strength in all 4 extremities.no focal  weakness Sensory examination: Soft touch sensation is symmetric on the face, arms, and legs. Coordination: The patient has good finger-nose-finger and heel-to-shin bilaterally. Gait and station: The patient has a limping gait on his right leg, Tandem gait is unsteady. Romberg is negative. No drift is seen. Reflexes: Deep tendon reflexes are symmetric.  DIAGNOSTIC DATA (LABS, IMAGING, TESTING) - I reviewed patient records, labs, notes, testing and imaging myself where available.  Lab Results  Component Value Date   WBC 4.3 07/17/2015   HGB 9.6* 07/17/2015   HCT 28.6* 07/17/2015  MCV 101.8* 07/17/2015   PLT 26* 07/17/2015      Component Value Date/Time   NA 134* 07/16/2015 0523   K 4.3 07/16/2015 0523   CL 109 07/16/2015 0523   CO2 21* 07/16/2015 0523   GLUCOSE 97 07/16/2015 0523   BUN 11 07/16/2015 0523   CREATININE 0.75 07/16/2015 0523   CALCIUM 8.2* 07/16/2015 0523   PROT 5.8* 07/07/2015 1525   ALBUMIN 2.9* 07/07/2015 1525   AST 36 07/07/2015 1525   ALT 26 07/07/2015 1525   ALKPHOS 129* 07/07/2015 1525   BILITOT 2.5* 07/07/2015 1525   GFRNONAA >60 07/16/2015 0523   GFRAA >60 07/16/2015 0523   Lab Results  Component Value Date   CHOL 147 04/30/2015   HDL 45.70 04/30/2015   LDLCALC 80 04/30/2015   LDLDIRECT 117.8 01/07/2013   TRIG 107.0 04/30/2015   CHOLHDL 3 04/30/2015     Lab Results  Component Value Date   TSH 1.41 04/30/2015      ASSESSMENT AND PLAN  50 y.o. year old male  has a past medical history of Hernia; Bipolar affective disorder ([place]); Depression; Seizures ([place]); Wilson disease; Encephalopathy; Thrombocytopenia ([place]); Chronic liver disease and cirrhosis; History of alcohol abuse; Dyslipidemia;  DJD (degenerative joint disease); Cancer [facility]); and Esophageal varices ([place]). here to follow-up. No seizure activity in several years, currently well-controlled on Keppra.  Continue Keppra current dose will refill Follow-up yearly and when necessary Call  for any seizure activity Dennie Bible, [facility], [facility], APRN  Ms [facility] [address] [place], [place] [address] [phone]

## 2015-10-12 DIAGNOSIS — R2689 Other abnormalities of gait and mobility: Secondary | ICD-10-CM | POA: Diagnosis not present

## 2015-10-12 DIAGNOSIS — M25661 Stiffness of right knee, not elsewhere classified: Secondary | ICD-10-CM | POA: Diagnosis not present

## 2015-10-12 DIAGNOSIS — M25561 Pain in right knee: Secondary | ICD-10-CM | POA: Diagnosis not present

## 2015-10-12 DIAGNOSIS — M6281 Muscle weakness (generalized): Secondary | ICD-10-CM | POA: Diagnosis not present

## 2015-10-18 DIAGNOSIS — M25561 Pain in right knee: Secondary | ICD-10-CM | POA: Diagnosis not present

## 2015-10-18 DIAGNOSIS — M25661 Stiffness of right knee, not elsewhere classified: Secondary | ICD-10-CM | POA: Diagnosis not present

## 2015-10-18 DIAGNOSIS — R2689 Other abnormalities of gait and mobility: Secondary | ICD-10-CM | POA: Diagnosis not present

## 2015-10-18 DIAGNOSIS — M6281 Muscle weakness (generalized): Secondary | ICD-10-CM | POA: Diagnosis not present

## 2015-10-26 DIAGNOSIS — M6281 Muscle weakness (generalized): Secondary | ICD-10-CM | POA: Diagnosis not present

## 2015-10-26 DIAGNOSIS — R2689 Other abnormalities of gait and mobility: Secondary | ICD-10-CM | POA: Diagnosis not present

## 2015-10-26 DIAGNOSIS — M25661 Stiffness of right knee, not elsewhere classified: Secondary | ICD-10-CM | POA: Diagnosis not present

## 2015-10-26 DIAGNOSIS — M25561 Pain in right knee: Secondary | ICD-10-CM | POA: Diagnosis not present

## 2015-10-30 ENCOUNTER — Other Ambulatory Visit: Payer: Self-pay | Admitting: Neurology

## 2015-11-02 DIAGNOSIS — M6281 Muscle weakness (generalized): Secondary | ICD-10-CM | POA: Diagnosis not present

## 2015-11-02 DIAGNOSIS — R2689 Other abnormalities of gait and mobility: Secondary | ICD-10-CM | POA: Diagnosis not present

## 2015-11-02 DIAGNOSIS — M25561 Pain in right knee: Secondary | ICD-10-CM | POA: Diagnosis not present

## 2015-11-02 DIAGNOSIS — M25661 Stiffness of right knee, not elsewhere classified: Secondary | ICD-10-CM | POA: Diagnosis not present

## 2015-11-09 ENCOUNTER — Ambulatory Visit: Payer: Commercial Managed Care - HMO | Admitting: Internal Medicine

## 2015-11-09 DIAGNOSIS — K7460 Unspecified cirrhosis of liver: Secondary | ICD-10-CM | POA: Diagnosis not present

## 2015-11-09 DIAGNOSIS — D126 Benign neoplasm of colon, unspecified: Secondary | ICD-10-CM | POA: Diagnosis not present

## 2015-11-09 DIAGNOSIS — Z1211 Encounter for screening for malignant neoplasm of colon: Secondary | ICD-10-CM | POA: Diagnosis not present

## 2015-11-09 DIAGNOSIS — E871 Hypo-osmolality and hyponatremia: Secondary | ICD-10-CM | POA: Diagnosis not present

## 2015-11-09 DIAGNOSIS — D128 Benign neoplasm of rectum: Secondary | ICD-10-CM | POA: Diagnosis not present

## 2015-11-09 DIAGNOSIS — Z09 Encounter for follow-up examination after completed treatment for conditions other than malignant neoplasm: Secondary | ICD-10-CM | POA: Diagnosis not present

## 2015-11-09 DIAGNOSIS — K4090 Unilateral inguinal hernia, without obstruction or gangrene, not specified as recurrent: Secondary | ICD-10-CM | POA: Diagnosis not present

## 2015-11-09 DIAGNOSIS — D120 Benign neoplasm of cecum: Secondary | ICD-10-CM | POA: Diagnosis not present

## 2015-11-09 DIAGNOSIS — D696 Thrombocytopenia, unspecified: Secondary | ICD-10-CM | POA: Diagnosis not present

## 2015-11-09 DIAGNOSIS — D123 Benign neoplasm of transverse colon: Secondary | ICD-10-CM | POA: Diagnosis not present

## 2015-11-09 DIAGNOSIS — I8510 Secondary esophageal varices without bleeding: Secondary | ICD-10-CM | POA: Diagnosis not present

## 2015-11-09 DIAGNOSIS — D125 Benign neoplasm of sigmoid colon: Secondary | ICD-10-CM | POA: Diagnosis not present

## 2015-11-09 DIAGNOSIS — R569 Unspecified convulsions: Secondary | ICD-10-CM | POA: Diagnosis not present

## 2015-11-11 DIAGNOSIS — M25561 Pain in right knee: Secondary | ICD-10-CM | POA: Diagnosis not present

## 2015-11-11 DIAGNOSIS — M6281 Muscle weakness (generalized): Secondary | ICD-10-CM | POA: Diagnosis not present

## 2015-11-11 DIAGNOSIS — M25661 Stiffness of right knee, not elsewhere classified: Secondary | ICD-10-CM | POA: Diagnosis not present

## 2015-11-11 DIAGNOSIS — R2689 Other abnormalities of gait and mobility: Secondary | ICD-10-CM | POA: Diagnosis not present

## 2015-11-18 DIAGNOSIS — M25661 Stiffness of right knee, not elsewhere classified: Secondary | ICD-10-CM | POA: Diagnosis not present

## 2015-11-18 DIAGNOSIS — R2689 Other abnormalities of gait and mobility: Secondary | ICD-10-CM | POA: Diagnosis not present

## 2015-11-18 DIAGNOSIS — M6281 Muscle weakness (generalized): Secondary | ICD-10-CM | POA: Diagnosis not present

## 2015-11-18 DIAGNOSIS — M25561 Pain in right knee: Secondary | ICD-10-CM | POA: Diagnosis not present

## 2015-11-24 DIAGNOSIS — M6281 Muscle weakness (generalized): Secondary | ICD-10-CM | POA: Diagnosis not present

## 2015-11-24 DIAGNOSIS — M25561 Pain in right knee: Secondary | ICD-10-CM | POA: Diagnosis not present

## 2015-11-24 DIAGNOSIS — R2689 Other abnormalities of gait and mobility: Secondary | ICD-10-CM | POA: Diagnosis not present

## 2015-11-24 DIAGNOSIS — M25661 Stiffness of right knee, not elsewhere classified: Secondary | ICD-10-CM | POA: Diagnosis not present

## 2015-11-30 ENCOUNTER — Encounter (INDEPENDENT_AMBULATORY_CARE_PROVIDER_SITE_OTHER): Payer: Self-pay

## 2015-12-01 DIAGNOSIS — M25561 Pain in right knee: Secondary | ICD-10-CM | POA: Diagnosis not present

## 2015-12-01 DIAGNOSIS — M6281 Muscle weakness (generalized): Secondary | ICD-10-CM | POA: Diagnosis not present

## 2015-12-01 DIAGNOSIS — M25661 Stiffness of right knee, not elsewhere classified: Secondary | ICD-10-CM | POA: Diagnosis not present

## 2015-12-01 DIAGNOSIS — R2689 Other abnormalities of gait and mobility: Secondary | ICD-10-CM | POA: Diagnosis not present

## 2015-12-08 DIAGNOSIS — M25561 Pain in right knee: Secondary | ICD-10-CM | POA: Diagnosis not present

## 2015-12-08 DIAGNOSIS — M25661 Stiffness of right knee, not elsewhere classified: Secondary | ICD-10-CM | POA: Diagnosis not present

## 2015-12-08 DIAGNOSIS — M6281 Muscle weakness (generalized): Secondary | ICD-10-CM | POA: Diagnosis not present

## 2015-12-08 DIAGNOSIS — R2689 Other abnormalities of gait and mobility: Secondary | ICD-10-CM | POA: Diagnosis not present

## 2015-12-17 DIAGNOSIS — R2689 Other abnormalities of gait and mobility: Secondary | ICD-10-CM | POA: Diagnosis not present

## 2015-12-17 DIAGNOSIS — M6281 Muscle weakness (generalized): Secondary | ICD-10-CM | POA: Diagnosis not present

## 2015-12-17 DIAGNOSIS — M25561 Pain in right knee: Secondary | ICD-10-CM | POA: Diagnosis not present

## 2015-12-17 DIAGNOSIS — M25661 Stiffness of right knee, not elsewhere classified: Secondary | ICD-10-CM | POA: Diagnosis not present

## 2015-12-27 DIAGNOSIS — C220 Liver cell carcinoma: Secondary | ICD-10-CM | POA: Diagnosis not present

## 2015-12-27 DIAGNOSIS — K766 Portal hypertension: Secondary | ICD-10-CM | POA: Diagnosis not present

## 2015-12-27 DIAGNOSIS — K7689 Other specified diseases of liver: Secondary | ICD-10-CM | POA: Diagnosis not present

## 2015-12-27 DIAGNOSIS — I82890 Acute embolism and thrombosis of other specified veins: Secondary | ICD-10-CM | POA: Diagnosis not present

## 2015-12-27 DIAGNOSIS — R161 Splenomegaly, not elsewhere classified: Secondary | ICD-10-CM | POA: Diagnosis not present

## 2015-12-27 DIAGNOSIS — K7469 Other cirrhosis of liver: Secondary | ICD-10-CM | POA: Diagnosis not present

## 2015-12-27 DIAGNOSIS — Z9889 Other specified postprocedural states: Secondary | ICD-10-CM | POA: Diagnosis not present

## 2015-12-29 DIAGNOSIS — M25661 Stiffness of right knee, not elsewhere classified: Secondary | ICD-10-CM | POA: Diagnosis not present

## 2015-12-29 DIAGNOSIS — M25561 Pain in right knee: Secondary | ICD-10-CM | POA: Diagnosis not present

## 2015-12-29 DIAGNOSIS — M6281 Muscle weakness (generalized): Secondary | ICD-10-CM | POA: Diagnosis not present

## 2015-12-29 DIAGNOSIS — R2689 Other abnormalities of gait and mobility: Secondary | ICD-10-CM | POA: Diagnosis not present

## 2016-01-03 DIAGNOSIS — M25561 Pain in right knee: Secondary | ICD-10-CM | POA: Diagnosis not present

## 2016-01-03 DIAGNOSIS — M6281 Muscle weakness (generalized): Secondary | ICD-10-CM | POA: Diagnosis not present

## 2016-01-03 DIAGNOSIS — M25661 Stiffness of right knee, not elsewhere classified: Secondary | ICD-10-CM | POA: Diagnosis not present

## 2016-01-03 DIAGNOSIS — R2689 Other abnormalities of gait and mobility: Secondary | ICD-10-CM | POA: Diagnosis not present

## 2016-01-12 DIAGNOSIS — R2689 Other abnormalities of gait and mobility: Secondary | ICD-10-CM | POA: Diagnosis not present

## 2016-01-12 DIAGNOSIS — M6281 Muscle weakness (generalized): Secondary | ICD-10-CM | POA: Diagnosis not present

## 2016-01-12 DIAGNOSIS — M25661 Stiffness of right knee, not elsewhere classified: Secondary | ICD-10-CM | POA: Diagnosis not present

## 2016-01-12 DIAGNOSIS — M25561 Pain in right knee: Secondary | ICD-10-CM | POA: Diagnosis not present

## 2016-02-09 DIAGNOSIS — M6281 Muscle weakness (generalized): Secondary | ICD-10-CM | POA: Diagnosis not present

## 2016-02-09 DIAGNOSIS — R2689 Other abnormalities of gait and mobility: Secondary | ICD-10-CM | POA: Diagnosis not present

## 2016-02-09 DIAGNOSIS — M25561 Pain in right knee: Secondary | ICD-10-CM | POA: Diagnosis not present

## 2016-02-09 DIAGNOSIS — M25661 Stiffness of right knee, not elsewhere classified: Secondary | ICD-10-CM | POA: Diagnosis not present

## 2016-03-13 DIAGNOSIS — Z79899 Other long term (current) drug therapy: Secondary | ICD-10-CM | POA: Diagnosis not present

## 2016-03-13 DIAGNOSIS — K7460 Unspecified cirrhosis of liver: Secondary | ICD-10-CM | POA: Diagnosis not present

## 2016-03-13 DIAGNOSIS — Z08 Encounter for follow-up examination after completed treatment for malignant neoplasm: Secondary | ICD-10-CM | POA: Diagnosis not present

## 2016-03-13 DIAGNOSIS — C220 Liver cell carcinoma: Secondary | ICD-10-CM | POA: Diagnosis not present

## 2016-03-13 DIAGNOSIS — K766 Portal hypertension: Secondary | ICD-10-CM | POA: Diagnosis not present

## 2016-06-07 ENCOUNTER — Encounter: Payer: Self-pay | Admitting: Internal Medicine

## 2016-06-07 ENCOUNTER — Ambulatory Visit (INDEPENDENT_AMBULATORY_CARE_PROVIDER_SITE_OTHER): Payer: Commercial Managed Care - HMO | Admitting: Internal Medicine

## 2016-06-07 ENCOUNTER — Other Ambulatory Visit (INDEPENDENT_AMBULATORY_CARE_PROVIDER_SITE_OTHER): Payer: Commercial Managed Care - HMO

## 2016-06-07 VITALS — BP 124/76 | HR 52 | Temp 98.1°F | Resp 20 | Wt 172.5 lb

## 2016-06-07 DIAGNOSIS — Z202 Contact with and (suspected) exposure to infections with a predominantly sexual mode of transmission: Secondary | ICD-10-CM

## 2016-06-07 DIAGNOSIS — Z23 Encounter for immunization: Secondary | ICD-10-CM | POA: Diagnosis not present

## 2016-06-07 DIAGNOSIS — Z1159 Encounter for screening for other viral diseases: Secondary | ICD-10-CM

## 2016-06-07 DIAGNOSIS — Z0000 Encounter for general adult medical examination without abnormal findings: Secondary | ICD-10-CM

## 2016-06-07 DIAGNOSIS — Z0001 Encounter for general adult medical examination with abnormal findings: Secondary | ICD-10-CM

## 2016-06-07 LAB — LIPID PANEL
Cholesterol: 141 mg/dL (ref 0–200)
HDL: 42.7 mg/dL (ref >39.00)
LDL Cholesterol: 81 mg/dL (ref 0–99)
NonHDL: 97.82
Total CHOL/HDL Ratio: 3
Triglycerides: 86 mg/dL (ref 0.0–149.0)
VLDL: 17.2 mg/dL (ref 0.0–40.0)

## 2016-06-07 LAB — CBC WITH DIFFERENTIAL/PLATELET
Basophils Absolute: 0 10*3/uL (ref 0.0–0.1)
Basophils Relative: 0.6 % (ref 0.0–3.0)
Eosinophils Absolute: 0.2 10*3/uL (ref 0.0–0.7)
Eosinophils Relative: 2.6 % (ref 0.0–5.0)
HCT: 39.3 % (ref 39.0–52.0)
Hemoglobin: 13.6 g/dL (ref 13.0–17.0)
Lymphocytes Relative: 17.8 % (ref 12.0–46.0)
Lymphs Abs: 1.2 10*3/uL (ref 0.7–4.0)
MCHC: 34.6 g/dL (ref 30.0–36.0)
MCV: 95.8 fl (ref 78.0–100.0)
Monocytes Absolute: 0.7 10*3/uL (ref 0.1–1.0)
Monocytes Relative: 10.3 % (ref 3.0–12.0)
Neutro Abs: 4.5 10*3/uL (ref 1.4–7.7)
Neutrophils Relative %: 68.7 % (ref 43.0–77.0)
Platelets: 71 10*3/uL — ABNORMAL LOW (ref 150.0–400.0)
RBC: 4.1 Mil/uL — ABNORMAL LOW (ref 4.22–5.81)
RDW: 16.3 % — ABNORMAL HIGH (ref 11.5–15.5)
WBC: 6.6 10*3/uL (ref 4.0–10.5)

## 2016-06-07 LAB — URINALYSIS, ROUTINE W REFLEX MICROSCOPIC
Bilirubin Urine: NEGATIVE
Hgb urine dipstick: NEGATIVE
Ketones, ur: NEGATIVE
Leukocytes, UA: NEGATIVE
Nitrite: NEGATIVE
Specific Gravity, Urine: 1.01 (ref 1.000–1.030)
Total Protein, Urine: NEGATIVE
Urine Glucose: NEGATIVE
Urobilinogen, UA: 1 (ref 0.0–1.0)
pH: 6 (ref 5.0–8.0)

## 2016-06-07 LAB — BASIC METABOLIC PANEL
BUN: 10 mg/dL (ref 6–23)
CO2: 26 mEq/L (ref 19–32)
Calcium: 9 mg/dL (ref 8.4–10.5)
Chloride: 108 mEq/L (ref 96–112)
Creatinine, Ser: 0.67 mg/dL (ref 0.40–1.50)
GFR: 132.33 mL/min (ref >60.00)
Glucose, Bld: 86 mg/dL (ref 70–99)
Potassium: 4.3 mEq/L (ref 3.5–5.1)
Sodium: 138 mEq/L (ref 135–145)

## 2016-06-07 LAB — HEPATIC FUNCTION PANEL
ALT: 23 U/L (ref 0–53)
AST: 26 U/L (ref 0–37)
Albumin: 2.9 g/dL — ABNORMAL LOW (ref 3.5–5.2)
Alkaline Phosphatase: 106 U/L (ref 39–117)
Bilirubin, Direct: 0.5 mg/dL — ABNORMAL HIGH (ref 0.0–0.3)
Total Bilirubin: 2.2 mg/dL — ABNORMAL HIGH (ref 0.2–1.2)
Total Protein: 5.7 g/dL — ABNORMAL LOW (ref 6.0–8.3)

## 2016-06-07 LAB — PSA: PSA: 0.11 ng/mL (ref 0.10–4.00)

## 2016-06-07 LAB — TSH: TSH: 1.01 u[IU]/mL (ref 0.35–4.50)

## 2016-06-07 NOTE — Progress Notes (Signed)
Pre visit review using our clinic review tool, if applicable. No additional management support is needed unless otherwise documented below in the visit note. 

## 2016-06-07 NOTE — Patient Instructions (Signed)

## 2016-06-07 NOTE — Progress Notes (Signed)
Subjective:    Patient ID: [name], male    DOB: Aug 11, 1966, 50 y.o.   MRN: [id number]  HPI  Here for wellness and f/u;  Overall doing ok;  Pt denies Chest pain, worsening SOB, DOE, wheezing, orthopnea, PND, worsening LE edema, palpitations, dizziness or syncope.  Pt denies neurological change such as new headache, facial or extremity weakness.  Pt denies polydipsia, polyuria, or low sugar symptoms. Pt states overall good compliance with treatment and medications, good tolerability, and has been trying to follow appropriate diet.  Pt denies worsening depressive symptoms, suicidal ideation or panic. No fever, night sweats, wt loss, loss of appetite, or other constitutional symptoms.  Pt states good ability with ADL's, has low fall risk, home safety reviewed and adequate, no other significant changes in hearing or vision, and only occasionally active with exercise.   S/p right knee TKR in Nov 2016,  Doing well.  Seeing [facility] for wilsons dz, no longer on penicillamine.  Also having MRI for liver every 6 mo (was 3 mo)   Getting ready for a new relationship, asks for routine STD check. No prior tx. Only taking inderall at HALF of the 10 mg twice per day due to severe fatigue and weakness at full dosing.  Denies urinary symptoms such as dysuria, frequency, urgency, flank pain, hematuria or n/v, fever, chills. Past Medical History:  Diagnosis Date  . Abuse, drug or alcohol   . Bipolar affective disorder ([place])    [place]  . Cancer [facility])    liver cancer treated with micrablation  . Chronic liver disease and cirrhosis   . Depression   . DJD (degenerative joint disease)    right knee  . Dyslipidemia   . Encephalopathy   . Esophageal varices ([place])   . Hernia   . History of alcohol abuse   . PONV (postoperative nausea and vomiting)   . Seizures ([place])    Dr Jannifer Franklin  . Thrombocytopenia ([place])   . Wilson disease    Past Surgical History:  Procedure Laterality Date  .  ESOPHAGOGASTRODUODENOSCOPY    . HARDWARE REMOVAL Right 01/22/2015   Procedure: HARDWARE REMOVAL RIGHT LEG;  Surgeon: Leandrew Koyanagi, MD;  Location: [place];  Service: Orthopedics;  Laterality: Right;  . left inguinal hernia  june 2012   [place]  . liver cancer  02/2015   ablation  . microablation of liver    . OPEN REDUCTION INTERNAL FIXATION (ORIF) TIBIA/FIBULA FRACTURE Right 07/25/2013   Procedure: OPEN REDUCTION INTERNAL FIXATION (ORIF) RIGHT TIBIAL PLATEAU, TIBIAL SHAFT AND FIBULA FRACTURES, POSSIBLE FASCIOTOMIES;  Surgeon: Marianna Payment, MD;  Location: [place];  Service: Orthopedics;  Laterality: Right;  . TOTAL KNEE ARTHROPLASTY Right 07/15/2015   Procedure: RIGHT TOTAL KNEE ARTHROPLASTY;  Surgeon: Leandrew Koyanagi, MD;  Location: [place];  Service: Orthopedics;  Laterality: Right;  [name] VASCULAR SURGERY      reports that he has been smoking Cigarettes.  He has been smoking about 0.25 packs per day. He has never used smokeless tobacco. He reports that he does not drink alcohol or use drugs. family history includes Dementia in his mother; Diabetes in his father; Glaucoma in his brother; Heart disease in his other. Allergies  Allergen Reactions  . Codeine Nausea And Vomiting    Passed out, was also drinking at the time   Current Outpatient Prescriptions on File Prior to Visit  Medication Sig Dispense Refill  . acetaminophen (TYLENOL) 500 MG tablet  Take 500 mg by mouth every 6 (six) hours as needed (pain). Take 3 tabs as needed for pain.    . furosemide (LASIX) 20 MG tablet Take 60 mg by mouth 2 (two) times daily.    [name] lactulose (CEPHULAC) 10 G packet Take 10 g by mouth daily as needed (toxin buildup).     [name] levETIRAcetam (KEPPRA) 1000 MG tablet Take 1 tablet (1,000 mg total) by mouth 2 (two) times daily. 180 tablet 3  . MILK THISTLE PO Take 1 tablet by mouth 2 (two) times daily.    . potassium chloride (K-DUR) 10 MEQ tablet TAKE 1 TABLET BY MOUTH TWICE DAILY 60 tablet 11  . propranolol  (INDERAL) 10 MG tablet Take 10 mg by mouth 2 (two) times daily.     [name] spironolactone (ALDACTONE) 100 MG tablet Take 50-100 mg by mouth 2 (two) times daily. 100 mg in the morning and 50 mg in the evening    . zinc gluconate 50 MG tablet Take 50 mg by mouth 3 (three) times daily.      No current facility-administered medications on file prior to visit.    Review of Systems Constitutional: Negative for increased diaphoresis, or other activity, appetite or siginficant weight change other than noted HENT: Negative for worsening hearing loss, ear pain, facial swelling, mouth sores and neck stiffness.   Eyes: Negative for other worsening pain, redness or visual disturbance.  Respiratory: Negative for choking or stridor Cardiovascular: Negative for other chest pain and palpitations.  Gastrointestinal: Negative for worsening diarrhea, blood in stool, or abdominal distention Genitourinary: Negative for hematuria, flank pain or change in urine volume.  Musculoskeletal: Negative for myalgias or other joint complaints.  Skin: Negative for other color change and wound or drainage.  Neurological: Negative for syncope and numbness. other than noted Hematological: Negative for adenopathy. or other swelling Psychiatric/Behavioral: Negative for hallucinations, SI, self-injury, decreased concentration or other worsening agitation.      Objective:   Physical Exam BP 124/76   Pulse (!) 52   Temp 98.1 F (36.7 C) (Oral)   Resp 20   Wt 172 lb 8 oz (78.2 kg)   SpO2 98%   BMI 23.40 kg/m  VS noted,  Constitutional: Pt is oriented to person, place, and time. Appears well-developed and well-nourished, in no significant distress Head: Normocephalic and atraumatic  Eyes: Conjunctivae and EOM are normal. Pupils are equal, round, and reactive to light Right Ear: External ear normal.  Left Ear: External ear normal Nose: Nose normal.  Mouth/Throat: Oropharynx is clear and moist  Neck: Normal range of motion.  Neck supple. No JVD present. No tracheal deviation present or significant neck LA or mass Cardiovascular: Normal rate, regular rhythm, normal heart sounds and intact distal pulses.   Pulmonary/Chest: Effort normal and breath sounds without rales or wheezing  Abdominal: Soft. Bowel sounds are normal. NT. No HSM  Musculoskeletal: Normal range of motion. Exhibits with trace RLE edema Lymphadenopathy: Has no cervical adenopathy.  Neurological: Pt is alert and oriented to person, place, and time. Pt has normal reflexes. No cranial nerve deficit. Motor grossly intact Skin: Skin is warm and dry. No rash noted or new ulcers Psychiatric:  Has normal mood and affect. Behavior is normal.    Assessment & Plan:

## 2016-06-08 LAB — RPR

## 2016-06-08 LAB — HEPATITIS C ANTIBODY: HCV Ab: NEGATIVE

## 2016-06-08 LAB — HSV 2 ANTIBODY, IGG: HSV 2 Glycoprotein G Ab, IgG: <0.9 Index (ref <0.90)

## 2016-06-08 LAB — GC/CHLAMYDIA PROBE AMP
CT Probe RNA: NOT DETECTED
GC Probe RNA: NOT DETECTED

## 2016-06-08 LAB — HIV ANTIBODY (ROUTINE TESTING W REFLEX): HIV 1&2 Ab, 4th Generation: NONREACTIVE

## 2016-06-10 NOTE — Assessment & Plan Note (Addendum)
Asympt, entering new relationship, has hx of unprotected intercourse,  For labs today, to f/u any worsening symptoms or concerns  In addition to the time spent performing CPE, I spent an additional 10 minutes face to face,in which greater than 50% of this time was spent in counseling and coordination of care for patient's illness as documented.

## 2016-06-10 NOTE — Assessment & Plan Note (Signed)

## 2016-07-06 ENCOUNTER — Other Ambulatory Visit: Payer: Self-pay | Admitting: Internal Medicine

## 2016-08-12 ENCOUNTER — Other Ambulatory Visit: Payer: Self-pay | Admitting: Internal Medicine

## 2016-09-28 DIAGNOSIS — I81 Portal vein thrombosis: Secondary | ICD-10-CM | POA: Diagnosis not present

## 2016-09-28 DIAGNOSIS — R161 Splenomegaly, not elsewhere classified: Secondary | ICD-10-CM | POA: Diagnosis not present

## 2016-09-28 DIAGNOSIS — C220 Liver cell carcinoma: Secondary | ICD-10-CM | POA: Diagnosis not present

## 2016-10-10 ENCOUNTER — Ambulatory Visit: Payer: Commercial Managed Care - HMO | Admitting: Nurse Practitioner

## 2016-10-11 ENCOUNTER — Encounter: Payer: Self-pay | Admitting: Nurse Practitioner

## 2016-10-25 DIAGNOSIS — K7460 Unspecified cirrhosis of liver: Secondary | ICD-10-CM | POA: Diagnosis not present

## 2016-10-29 ENCOUNTER — Emergency Department (HOSPITAL_COMMUNITY): Payer: Medicare HMO

## 2016-10-29 ENCOUNTER — Encounter (HOSPITAL_COMMUNITY): Payer: Self-pay | Admitting: Emergency Medicine

## 2016-10-29 ENCOUNTER — Observation Stay (HOSPITAL_COMMUNITY)
Admission: EM | Admit: 2016-10-29 | Discharge: 2016-10-30 | Disposition: A | Discharge status: Home / Self Care | Payer: Medicare HMO | Attending: Internal Medicine | Admitting: Internal Medicine

## 2016-10-29 ENCOUNTER — Encounter (HOSPITAL_COMMUNITY): Payer: Self-pay

## 2016-10-29 ENCOUNTER — Emergency Department (HOSPITAL_COMMUNITY)
Admission: EM | Admit: 2016-10-29 | Discharge: 2016-10-29 | Disposition: A | Discharge status: Home / Self Care | Payer: Medicare HMO | Source: Home / Self Care | Attending: Emergency Medicine | Admitting: Emergency Medicine

## 2016-10-29 DIAGNOSIS — R569 Unspecified convulsions: Secondary | ICD-10-CM

## 2016-10-29 DIAGNOSIS — Y999 Unspecified external cause status: Secondary | ICD-10-CM

## 2016-10-29 DIAGNOSIS — W1830XA Fall on same level, unspecified, initial encounter: Secondary | ICD-10-CM | POA: Insufficient documentation

## 2016-10-29 DIAGNOSIS — Z8505 Personal history of malignant neoplasm of liver: Secondary | ICD-10-CM

## 2016-10-29 DIAGNOSIS — G40909 Epilepsy, unspecified, not intractable, without status epilepticus: Secondary | ICD-10-CM | POA: Diagnosis not present

## 2016-10-29 DIAGNOSIS — Z96651 Presence of right artificial knee joint: Secondary | ICD-10-CM | POA: Insufficient documentation

## 2016-10-29 DIAGNOSIS — Y9389 Activity, other specified: Secondary | ICD-10-CM

## 2016-10-29 DIAGNOSIS — F17210 Nicotine dependence, cigarettes, uncomplicated: Secondary | ICD-10-CM

## 2016-10-29 DIAGNOSIS — S0091XA Abrasion of unspecified part of head, initial encounter: Secondary | ICD-10-CM | POA: Diagnosis not present

## 2016-10-29 DIAGNOSIS — S0081XA Abrasion of other part of head, initial encounter: Secondary | ICD-10-CM

## 2016-10-29 DIAGNOSIS — F329 Major depressive disorder, single episode, unspecified: Secondary | ICD-10-CM | POA: Insufficient documentation

## 2016-10-29 DIAGNOSIS — K7460 Unspecified cirrhosis of liver: Secondary | ICD-10-CM | POA: Insufficient documentation

## 2016-10-29 DIAGNOSIS — Y929 Unspecified place or not applicable: Secondary | ICD-10-CM | POA: Insufficient documentation

## 2016-10-29 DIAGNOSIS — Z79899 Other long term (current) drug therapy: Secondary | ICD-10-CM | POA: Diagnosis not present

## 2016-10-29 DIAGNOSIS — R791 Abnormal coagulation profile: Secondary | ICD-10-CM | POA: Insufficient documentation

## 2016-10-29 DIAGNOSIS — W228XXA Striking against or struck by other objects, initial encounter: Secondary | ICD-10-CM | POA: Diagnosis not present

## 2016-10-29 LAB — CBC WITH DIFFERENTIAL/PLATELET
Basophils Absolute: 0 10*3/uL (ref 0.0–0.1)
Basophils Relative: 1 %
Eosinophils Absolute: 0.2 10*3/uL (ref 0.0–0.7)
Eosinophils Relative: 3 %
HCT: 38.7 % — ABNORMAL LOW (ref 39.0–52.0)
Hemoglobin: 13.3 g/dL (ref 13.0–17.0)
Lymphocytes Relative: 23 %
Lymphs Abs: 1.3 10*3/uL (ref 0.7–4.0)
MCH: 32.8 pg (ref 26.0–34.0)
MCHC: 34.4 g/dL (ref 30.0–36.0)
MCV: 95.6 fL (ref 78.0–100.0)
Monocytes Absolute: 0.9 10*3/uL (ref 0.1–1.0)
Monocytes Relative: 16 %
Neutro Abs: 3.2 10*3/uL (ref 1.7–7.7)
Neutrophils Relative %: 57 %
Platelets: 54 10*3/uL — ABNORMAL LOW (ref 150–400)
RBC: 4.05 MIL/uL — ABNORMAL LOW (ref 4.22–5.81)
RDW: 15.9 % — ABNORMAL HIGH (ref 11.5–15.5)
WBC: 5.6 10*3/uL (ref 4.0–10.5)

## 2016-10-29 LAB — URINALYSIS, ROUTINE W REFLEX MICROSCOPIC
Bilirubin Urine: NEGATIVE
Glucose, UA: NEGATIVE mg/dL
Hgb urine dipstick: NEGATIVE
Ketones, ur: NEGATIVE mg/dL
Leukocytes, UA: NEGATIVE
Nitrite: NEGATIVE
Protein, ur: 30 mg/dL — AB
Specific Gravity, Urine: 1.026 (ref 1.005–1.030)
pH: 5 (ref 5.0–8.0)

## 2016-10-29 LAB — COMPREHENSIVE METABOLIC PANEL
ALT: 24 U/L (ref 17–63)
AST: 36 U/L (ref 15–41)
Albumin: 3 g/dL — ABNORMAL LOW (ref 3.5–5.0)
Alkaline Phosphatase: 112 U/L (ref 38–126)
Anion gap: 9 (ref 5–15)
BUN: 12 mg/dL (ref 6–20)
CO2: 20 mmol/L — ABNORMAL LOW (ref 22–32)
Calcium: 10.7 mg/dL — ABNORMAL HIGH (ref 8.9–10.3)
Chloride: 105 mmol/L (ref 101–111)
Creatinine, Ser: 0.86 mg/dL (ref 0.61–1.24)
GFR calc Af Amer: >60 mL/min (ref >60)
GFR calc non Af Amer: >60 mL/min (ref >60)
Glucose, Bld: 87 mg/dL (ref 65–99)
Potassium: 4.2 mmol/L (ref 3.5–5.1)
Sodium: 134 mmol/L — ABNORMAL LOW (ref 135–145)
Total Bilirubin: 3.1 mg/dL — ABNORMAL HIGH (ref 0.3–1.2)
Total Protein: 6 g/dL — ABNORMAL LOW (ref 6.5–8.1)

## 2016-10-29 LAB — PROTIME-INR
INR: 1.65
Prothrombin Time: 19.7 seconds — ABNORMAL HIGH (ref 11.4–15.2)

## 2016-10-29 MED ORDER — SODIUM CHLORIDE 0.9 % IV SOLN
1500.0000 mg | Freq: Once | INTRAVENOUS | Status: AC
  Administered 2016-10-29: 1500 mg via INTRAVENOUS
  Filled 2016-10-29: qty 15

## 2016-10-29 MED ORDER — FUROSEMIDE 40 MG PO TABS: 60.0000 mg | ORAL_TABLET | Freq: Two times a day (BID) | ORAL | Status: DC

## 2016-10-29 MED ORDER — SPIRONOLACTONE 25 MG PO TABS: 100.0000 mg | ORAL_TABLET | Freq: Every day | ORAL | Status: DC

## 2016-10-29 MED ORDER — PROPRANOLOL HCL 10 MG PO TABS
10.0000 mg | ORAL_TABLET | Freq: Two times a day (BID) | ORAL | Status: DC
  Filled 2016-10-29 (×4): qty 1

## 2016-10-29 MED ORDER — SPIRONOLACTONE 25 MG PO TABS: 50.0000 mg | ORAL_TABLET | Freq: Every day | ORAL | Status: DC

## 2016-10-29 MED ORDER — LACTULOSE 10 GM/15ML PO SOLN: 10.0000 g | Freq: Every day | ORAL | Status: DC | PRN

## 2016-10-29 MED ORDER — LEVETIRACETAM 750 MG PO TABS
1500.0000 mg | ORAL_TABLET | Freq: Two times a day (BID) | ORAL | Status: DC
  Administered 2016-10-30: 1500 mg via ORAL
  Filled 2016-10-29: qty 2

## 2016-10-29 MED ORDER — ACETAMINOPHEN 500 MG PO TABS
1000.0000 mg | ORAL_TABLET | Freq: Once | ORAL | Status: AC
Start: 2016-10-29 — End: 2016-10-29
  Administered 2016-10-29: 1000 mg via ORAL
  Filled 2016-10-29: qty 2

## 2016-10-29 MED ORDER — ACETAMINOPHEN 325 MG PO TABS
325.0000 mg | ORAL_TABLET | Freq: Four times a day (QID) | ORAL | Status: DC | PRN
  Administered 2016-10-30: 325 mg via ORAL
  Filled 2016-10-29: qty 1

## 2016-10-29 MED ORDER — BACITRACIN ZINC 500 UNIT/GM EX OINT: 1.0000 "application " | TOPICAL_OINTMENT | Freq: Two times a day (BID) | CUTANEOUS | Status: DC

## 2016-10-29 NOTE — ED Notes (Signed)
Patient transported to CT 

## 2016-10-29 NOTE — ED Triage Notes (Signed)
Pt discharge at 1915 today for seizure. Pt arrived EMS again after arriving home and having another seizure.

## 2016-10-29 NOTE — ED Notes (Signed)
Urinal given

## 2016-10-29 NOTE — Progress Notes (Signed)
Arrived from ED to 5M10. Alert and oriented. Generalized aching pain 3/10. Call light within reach.

## 2016-10-29 NOTE — H&P (Signed)
History and Physical   [name] [id number] DOB: [birth year] DOA: 10/29/2016  PCP: Cathlean Cower, MD  Chief Complaint: Seizures   HPI:  [name] is 51 year old Caucasian male past medical history significant for seizure disorder currently on Keppra therapy, remote history of alcohol abuse (no alcohol use since 2005), hyperlipidemia, cirrhosis complicated by [facility], depression. [name] presented to the emergency department complaining of increased seizure activity. Upon initial presentation to the emergency department, [name] was hemodynamically stable. After obtaining laboratory work and CT imaging of the head, ER contacted neurology, who recommended increasing Keppra dose to 1500 mg twice a day. This course of action was taken and [name] was discharged from the ER. However, [name] reports having another seizure at home, which prompted a second visit to the ER. [name] reports recently smoking marijuana and "staying up all night." He believes that sleep deprivation was the probable cause of his increased seizure activity. The first seizure lasted approximate 5 minutes. No loss of bowel or bladder function. [name] reports striking his head on the ground and had a minor abrasion to his right forehead. The second seizure lasted approximately 1 minute. Again, no loss of bowel or bladder function. Both seizures were tonic-clonic in nature. [name] reports normal sensation and strength. He denies dizziness, chest pain, shortness of breath. Otherwise, [name] reports being in his normal state of health. Aside from the marijuana use, [name] denies anyother drug use. No recent change in medications. No fever chills at home.  ED Course:  [name] was hemodynamically stable. No fever, normal heart rate, blood pressure 121/58. No significant metabolic disturbances. No leukocytosis. CT imaging of the head was unremarkable. After discussing the case with Dr. Rochele Pages from Neurology, [name] was loaded with  1500 mg Keppra IV and admitted to Medicine for further evaluation.   Review of Systems: A complete ROS was obtained; pertinent positives negatives are denoted in the HPI. Otherwise, all systems are negative.   Past Medical History:  Diagnosis Date  . Abuse, drug or alcohol   . Bipolar affective disorder ([place])    [place]  . Cancer [facility])    liver cancer treated with micrablation  . Chronic liver disease and cirrhosis   . Depression   . DJD (degenerative joint disease)    right knee  . Dyslipidemia   . Encephalopathy   . Esophageal varices ([place])   . Hernia   . History of alcohol abuse   . PONV (postoperative nausea and vomiting)   . Seizures ([place])    Dr Jannifer Franklin  . Thrombocytopenia ([place])   . Gila disease    Social History   Social History  . Marital status: Single    Spouse name: N/A  . Number of children: 2  . Years of education: HS   Occupational History  . disabled Disability   Social History Main Topics  . Smoking status: Current Every Day Smoker    Packs/day: 0.25    Types: Cigarettes  . Smokeless tobacco: Never Used  . Alcohol use No     Comment: 3-4 bottles a week, quit drinking 2005  . Drug use: No     Comment: last use 2005  . Sexual activity: Yes   Other Topics Concern  . Not on file   Social History Narrative   [name] is right handed.   [name] drinks some caffeine daily.   Family History  Problem Relation Age of Onset  . Heart disease Other   . Diabetes Father   .  Dementia Mother   . Glaucoma Brother     Physical Exam: Vitals:   10/29/16 2052 10/29/16 2100 10/29/16 2115 10/29/16 2130  BP: 121/58 113/62 (!) 106/51 111/56  Pulse: 66 68 61 (!) 58  Resp: 23 21 17 15   Temp: 98.1 F (36.7 C)     TempSrc: Oral     SpO2: 97% 96% 97% 98%  Weight:      Height:       General: Appears calm and comfortable. ENT: Grossly normal hearing, MMM. Cardiovascular: RRR. No M/R/G. No LE edema.  Respiratory: CTA bilaterally. No  wheezes or crackles. Normal respiratory effort. Abdomen: Soft, non-tender. Bowel sounds present.  Skin: Abrasion on her right forehead. No hematoma. Musculoskeletal: Grossly normal tone BUE/BLE. Appropriate ROM.  Psychiatric: Grossly normal mood and affect. Neurologic: Moves all extremities in coordinated fashion. Normal strength and sensation in upper and lower extremities. Grip strength equal bilateral. Cranial nerves II through XII grossly intact.  I have personally reviewed the following labs, culture data, and imaging studies.  Labs:  CBC remarkable for platelet count 54. No significant metabolic disturbances. 12, creatinine 0.86. INR mildly elevated at 1.65  Radiology:  CT head and CT cervical spine were within normal limits.  Assessment/Plan: [name] is 51 year old Caucasian male past medical history significant for seizure disorder currently on Keppra therapy, remote history of alcohol abuse (no alcohol use since 2005), hyperlipidemia, cirrhosis complicated by [facility], depression. [name] presented to the emergency department complaining of increased seizure activity.  #1 Increased seizure activity (tonic clonic) [name] has a long-standing history of seizure disorder. He appears to be fairly well maintained on Keppra 1000 g twice daily. Prior to this admission, [name] reports last seizure activity occurred approximately 6 weeks ago. Etiology of most recent seizure activity likely related to marijuana use and sleep deprivation. I highly doubt underlying infection. Neuro exam is unremarkable. CT of the head and cervical spine are also within normal limits. No suspicion of infection based upon history or physical examination. [name] was loaded with IV Keppra in the ER. Plan is to continue Keppra at increased dose of 1500 mg orally. We will formally consult neurology if [name] develops additional seizure activity. Will also check UDS. [name] will require counseling about marijuana  cessation at time of discharge.  #2 Cirrhosis complicated by [employer].  Abdominal exam is soft and nontender. Highly doubt underlying SBP. Labs, including platets, are at baseline. Mental status is normal. No signs of hepatic encephalopathy. Euvolemic on examination. Plan is to monitor with daily INR and CMP. Will continue lactulose, Lasix, and spironolactone.  #3 depression [name] has normal mood and affect. Does not appear to be taking SSRI or SSRI. Plan is to defer to PCP.  DVT prophylaxis: SCD's only Code Status: Full Code Disposition Plan: Anticipate D/C home in 1-2 days Consults called: ER spoke w/ Neurology; not formally consulted.  Admission status: Admitted under Observation status.  Clydene Laming, MD [facility] [phone]  If 7PM-7AM, please contact night-coverage [email or web address] Password [email or web address]

## 2016-10-29 NOTE — ED Provider Notes (Signed)
[facility] DEPT Provider Note   CSN: [id number] Arrival date & time: 10/29/16 2041     History    Chief Complaint  Patient presents with  . Seizures     HPI [name] is a 51 y.o. male.  51yo M w/ PMH including Had a cellular carcinoma, cirrhosis, seizure disorder, Wilson disease who presents with seizures. The patient was just discharged from here earlier this afternoon for a seizure witnessed by friends. He reported compliance with Keppra and after talking with neurology on-call the patient was to increase his Keppra to 1500 mg twice daily. He states he got home and was about to take his evening dose of medication when he reportedly had generalized tonic-clonic seizure according to his friend. He is amnestic to events.   Past Medical History:  Diagnosis Date  . Abuse, drug or alcohol   . Bipolar affective disorder ([place])    [place]  . Cancer [facility])    liver cancer treated with micrablation  . Chronic liver disease and cirrhosis   . Depression   . DJD (degenerative joint disease)    right knee  . Dyslipidemia   . Encephalopathy   . Esophageal varices ([place])   . Hernia   . History of alcohol abuse   . PONV (postoperative nausea and vomiting)   . Seizures ([place])    Dr Jannifer Franklin  . Thrombocytopenia ([place])   . [place] disease      Patient Active Problem List   Diagnosis Date Noted  . Possible exposure to STD 06/07/2016  . S/P total knee replacement using cement 07/15/2015  . Chest pain 04/30/2015  . Left inguinal hernia 10/22/2014  . Hyponatremia 05/06/2014  . Diarrhea 04/30/2014  . UTI (urinary tract infection) 07/29/2013  . Tibial plateau fracture, tibial shaft fracture 07/24/2013  . Erectile dysfunction 01/07/2013  . Hypokalemia 10/21/2011  . Eczema 10/21/2011  . Impaired glucose tolerance 10/19/2011  . Hepatic encephalopathy ([place]) 08/05/2011  . Depression   . Seizures ([place])   . Wilson disease   . Encephalopathy   .  Thrombocytopenia ([place])   . Chronic liver disease and cirrhosis ([place])   . History of alcohol abuse   . Encounter for well adult exam with abnormal findings 04/14/2011  . Bipolar affective disorder [facility])     Past Surgical History:  Procedure Laterality Date  . ESOPHAGOGASTRODUODENOSCOPY    . HARDWARE REMOVAL Right 01/22/2015   Procedure: HARDWARE REMOVAL RIGHT LEG;  Surgeon: Leandrew Koyanagi, MD;  [facility];  Service: Orthopedics;  Laterality: Right;  . left inguinal hernia  june 2012   [place]  . liver cancer  02/2015   ablation  . microablation of liver    . OPEN REDUCTION INTERNAL FIXATION (ORIF) TIBIA/FIBULA FRACTURE Right 07/25/2013   Procedure: OPEN REDUCTION INTERNAL FIXATION (ORIF) RIGHT TIBIAL PLATEAU, TIBIAL SHAFT AND FIBULA FRACTURES, POSSIBLE FASCIOTOMIES;  Surgeon: Marianna Payment, MD;  Location: [place];  Service: Orthopedics;  Laterality: Right;  . TOTAL KNEE ARTHROPLASTY Right 07/15/2015   Procedure: RIGHT TOTAL KNEE ARTHROPLASTY;  Surgeon: Leandrew Koyanagi, MD;  Location: [place];  Service: Orthopedics;  Laterality: Right;  [name] VASCULAR SURGERY          Home Medications    Prior to Admission medications   Medication Sig Start Date End Date Taking? Authorizing Provider  acetaminophen (TYLENOL) 500 MG tablet Take 500 mg by mouth every 6 (six) hours as needed (pain). Take 3 tabs as needed for  pain.    Historical Provider, MD  furosemide (LASIX) 20 MG tablet Take 60 mg by mouth 2 (two) times daily.    Historical Provider, MD  lactulose (CEPHULAC) 10 G packet Take 10 g by mouth daily as needed (toxin buildup).  07/03/11   Historical Provider, MD  levETIRAcetam (KEPPRA) 1000 MG tablet Take 1 tablet (1,000 mg total) by mouth 2 (two) times daily. 10/11/15   Dennie Bible, NP  MILK THISTLE PO Take 1 tablet by mouth 2 (two) times daily.    Historical Provider, MD  potassium chloride (K-DUR) 10 MEQ tablet TAKE 1 TABLET BY MOUTH TWICE DAILY 06/28/15   Biagio Borg, MD    potassium chloride (K-DUR) 10 MEQ tablet TAKE 1 TABLET BY MOUTH TWICE DAILY 07/07/16   Biagio Borg, MD  potassium chloride (K-DUR) 10 MEQ tablet TAKE 1 TABLET BY MOUTH TWICE DAILY 08/15/16   Biagio Borg, MD  propranolol (INDERAL) 10 MG tablet Take 10 mg by mouth 2 (two) times daily.     Historical Provider, MD  spironolactone (ALDACTONE) 100 MG tablet Take 50-100 mg by mouth 2 (two) times daily. 100 mg in the morning and 50 mg in the evening    Historical Provider, MD  zinc gluconate 50 MG tablet Take 50 mg by mouth 3 (three) times daily.     Historical Provider, MD      Family History  Problem Relation Age of Onset  . Heart disease Other   . Diabetes Father   . Dementia Mother   . Glaucoma Brother      Social History  Substance Use Topics  . Smoking status: Current Every Day Smoker    Packs/day: 0.25    Types: Cigarettes  . Smokeless tobacco: Never Used  . Alcohol use No     Comment: 3-4 bottles a week, quit drinking 2005     Allergies     Codeine    Review of Systems  10 Systems reviewed and are negative for acute change except as noted in the HPI.   Physical Exam Updated Vital Signs BP 121/58 (BP Location: Right Arm)   Pulse 66   Temp 98.1 F (36.7 C) (Oral)   Resp 23   Ht 6' (1.829 m)   Wt 175 lb (79.4 kg)   SpO2 97%   BMI 23.73 kg/m   Physical Exam  Constitutional: He is oriented to person, place, and time. He appears well-developed and well-nourished. No distress.  Awake, alert  HENT:  Head: Normocephalic.  Abrasions on face  Eyes: Conjunctivae and EOM are normal. Pupils are equal, round, and reactive to light.  Neck: Normal range of motion. Neck supple.  No midline cervical spine tenderness  Cardiovascular: Normal rate, regular rhythm and normal heart sounds.   No murmur heard. Pulmonary/Chest: Effort normal and breath sounds normal. No respiratory distress.  Abdominal: Soft. Bowel sounds are normal. He exhibits no distension. There is no  tenderness.  Musculoskeletal: He exhibits no edema, tenderness or deformity.  Neurological: He is alert and oriented to person, place, and time. He has normal reflexes. No cranial nerve deficit. He exhibits normal muscle tone.  Fluent speech, normal finger-to-nose testing,  no clonus 5/5 strength and normal sensation x all 4 extremities  Skin: Skin is warm and dry.  Psychiatric: He has a normal mood and affect. Judgment and thought content normal.  Nursing note and vitals reviewed.     ED Treatments / Results  Labs (all labs ordered are  listed, but only abnormal results are displayed) Labs Reviewed - No data to display   EKG  EKG Interpretation  Date/Time:  Sunday October 29 2016 20:47:42 EST Ventricular Rate:  66 PR Interval:    QRS Duration: 89 QT Interval:  422 QTC Calculation: 443 R Axis:   73 Text Interpretation:  Sinus rhythm No significant change since last tracing Confirmed by Mal Asher MD, Rolinda Impson [phone]) on 10/29/2016 9:05:59 PM         Radiology Ct Head Wo Contrast  Result Date: 10/29/2016 CLINICAL DATA:  Seizures EXAM: CT HEAD WITHOUT CONTRAST CT CERVICAL SPINE WITHOUT CONTRAST TECHNIQUE: Multidetector CT imaging of the head and cervical spine was performed following the standard protocol without intravenous contrast. Multiplanar CT image reconstructions of the cervical spine were also generated. COMPARISON:  None. FINDINGS: CT HEAD FINDINGS Brain: No evidence of acute infarction, hemorrhage, hydrocephalus, extra-axial collection or mass lesion/mass effect. Vascular: No hyperdense vessel or unexpected calcification. Skull: Normal. Negative for fracture or focal lesion. Sinuses/Orbits: No acute finding. Other: None. CT CERVICAL SPINE FINDINGS Alignment: Normal alignment of the cervical spine. The vertebral body heights and disc spaces are well preserved. Skull base and vertebrae: No acute fracture. No primary bone lesion or focal pathologic process. Soft tissues and  spinal canal: No prevertebral fluid or swelling. No visible canal hematoma. Disc levels:  Degenerative disc disease is identified at C5-6. Upper chest: Negative. Other: None IMPRESSION: 1. No acute intracranial abnormality. 2. No evidence for cervical spine fracture or subluxation. Electronically Signed   By: Kerby Moors M.D.   On: 10/29/2016 17:35   Ct Cervical Spine Wo Contrast  Result Date: 10/29/2016 CLINICAL DATA:  Seizures EXAM: CT HEAD WITHOUT CONTRAST CT CERVICAL SPINE WITHOUT CONTRAST TECHNIQUE: Multidetector CT imaging of the head and cervical spine was performed following the standard protocol without intravenous contrast. Multiplanar CT image reconstructions of the cervical spine were also generated. COMPARISON:  None. FINDINGS: CT HEAD FINDINGS Brain: No evidence of acute infarction, hemorrhage, hydrocephalus, extra-axial collection or mass lesion/mass effect. Vascular: No hyperdense vessel or unexpected calcification. Skull: Normal. Negative for fracture or focal lesion. Sinuses/Orbits: No acute finding. Other: None. CT CERVICAL SPINE FINDINGS Alignment: Normal alignment of the cervical spine. The vertebral body heights and disc spaces are well preserved. Skull base and vertebrae: No acute fracture. No primary bone lesion or focal pathologic process. Soft tissues and spinal canal: No prevertebral fluid or swelling. No visible canal hematoma. Disc levels:  Degenerative disc disease is identified at C5-6. Upper chest: Negative. Other: None IMPRESSION: 1. No acute intracranial abnormality. 2. No evidence for cervical spine fracture or subluxation. Electronically Signed   By: Kerby Moors M.D.   On: 10/29/2016 17:35    Procedures Procedures (including critical care time) Procedures  Medications Ordered in ED  Medications - No data to display   Initial Impression / Assessment and Plan / ED Course  I have reviewed the triage vital signs and the nursing notes.  Pertinent labs & imaging  results that were available during my care of the patient were reviewed by me and considered in my medical decision making (see chart for details).     Pt presents again for 2nd seizure today; workup including lab work and CT of head and C-spine earlier today were normal. He was awake and alert, neurologically intact on exam. He was NEXUS negative therefore do not feel he needs repeat C-spine imaging. He has no new evidence of head trauma and given GCS 15 I do  not feel he needs repeat head CT currently. I discussed with neurologist, Dr. Rochele Pages and per his recommendations gave the patient 1500 mg IV Keppra loading dose. I feel the patient requires overnight observation given that this is his second seizure today. Chest admission with hospitalist, Dr. Redmond Pulling, who will admit the patient for observation.  Final Clinical Impressions(s) / ED Diagnoses   Final diagnoses:  None     New Prescriptions   No medications on file       Sharlett Iles, MD 10/29/16 2131

## 2016-10-29 NOTE — Discharge Instructions (Signed)
TAKE 1.5 TABLETS (1500mg ) KEPPRA TWICE DAILY (TONIGHT AND TOMORROW MORNING) AND SEE DR. Jannifer Franklin TOMORROW AT YOUR SCHEDULED APPOINTMENT.

## 2016-10-29 NOTE — ED Provider Notes (Signed)
[facility] DEPT Provider Note   CSN: [id number] Arrival date & time: 10/29/16 1537     History    Chief Complaint  Patient presents with  . Seizures     HPI [name] is a 51 y.o. male.  51yo M w/ extensive PMH including hepatocellular carcinoma, bipolar d/o, cirrhosis, Wilson disease who p/w seizure. Pt was fishing with his friends today and the last thing he remembers is re-stringing a fishing line. Friends reported to EMS that he fell and had tonic-clonic jerking movements. He Is amnestic to these events but does remember being taken care of by EMS. He states that he is compliant with his medications and did take his Keppra today. He follows with Dr. Jannifer Franklin for neurology and had a seizure 6 weeks ago. He has been alcohol free for 13 years and aside from marijuana use denies any other illicit drug use. He denies any fevers or recent illness. No visual problems, extremity weakness, or numbness.  Past Medical History:  Diagnosis Date  . Abuse, drug or alcohol   . Bipolar affective disorder ([place])    [place]  . Cancer [facility])    liver cancer treated with micrablation  . Chronic liver disease and cirrhosis   . Depression   . DJD (degenerative joint disease)    right knee  . Dyslipidemia   . Encephalopathy   . Esophageal varices ([place])   . Hernia   . History of alcohol abuse   . PONV (postoperative nausea and vomiting)   . Seizures ([place])    Dr Jannifer Franklin  . Thrombocytopenia ([place])   . [place] disease      Patient Active Problem List   Diagnosis Date Noted  . Seizure ([place]) 10/29/2016  . Possible exposure to STD 06/07/2016  . S/P total knee replacement using cement 07/15/2015  . Chest pain 04/30/2015  . Left inguinal hernia 10/22/2014  . Hyponatremia 05/06/2014  . Diarrhea 04/30/2014  . UTI (urinary tract infection) 07/29/2013  . Tibial plateau fracture, tibial shaft fracture 07/24/2013  . Erectile dysfunction 01/07/2013  . Hypokalemia  10/21/2011  . Eczema 10/21/2011  . Impaired glucose tolerance 10/19/2011  . Hepatic encephalopathy ([place]) 08/05/2011  . Depression   . Seizures ([place])   . Wilson disease   . Encephalopathy   . Thrombocytopenia ([place])   . Chronic liver disease and cirrhosis ([place])   . History of alcohol abuse   . Encounter for well adult exam with abnormal findings 04/14/2011  . Bipolar affective disorder [facility])     Past Surgical History:  Procedure Laterality Date  . ESOPHAGOGASTRODUODENOSCOPY    . HARDWARE REMOVAL Right 01/22/2015   Procedure: HARDWARE REMOVAL RIGHT LEG;  Surgeon: Leandrew Koyanagi, MD;  Location: [facility];  Service: Orthopedics;  Laterality: Right;  . left inguinal hernia  june 2012   [place]  . liver cancer  02/2015   ablation  . microablation of liver    . OPEN REDUCTION INTERNAL FIXATION (ORIF) TIBIA/FIBULA FRACTURE Right 07/25/2013   Procedure: OPEN REDUCTION INTERNAL FIXATION (ORIF) RIGHT TIBIAL PLATEAU, TIBIAL SHAFT AND FIBULA FRACTURES, POSSIBLE FASCIOTOMIES;  Surgeon: Marianna Payment, MD;  Location: [facility];  Service: Orthopedics;  Laterality: Right;  . TOTAL KNEE ARTHROPLASTY Right 07/15/2015   Procedure: RIGHT TOTAL KNEE ARTHROPLASTY;  Surgeon: Leandrew Koyanagi, MD;  Location: [facility];  Service: Orthopedics;  Laterality: Right;  [name] VASCULAR SURGERY          Home Medications  Prior to Admission medications   Medication Sig Start Date End Date Taking? Authorizing Provider  acetaminophen (TYLENOL) 500 MG tablet Take 500 mg by mouth every 6 (six) hours as needed (pain). Take 3 tabs as needed for pain.    Historical Provider, MD  furosemide (LASIX) 20 MG tablet Take 60 mg by mouth 2 (two) times daily.    Historical Provider, MD  lactulose (CEPHULAC) 10 G packet Take 10 g by mouth daily as needed (toxin buildup).  07/03/11   Historical Provider, MD  levETIRAcetam (KEPPRA) 1000 MG tablet Take 1 tablet (1,000 mg total) by mouth 2 (two) times daily. 10/11/15   Dennie Bible,  NP  MILK THISTLE PO Take 1 tablet by mouth 2 (two) times daily.    Historical Provider, MD  potassium chloride (K-DUR) 10 MEQ tablet TAKE 1 TABLET BY MOUTH TWICE DAILY 06/28/15   Biagio Borg, MD  potassium chloride (K-DUR) 10 MEQ tablet TAKE 1 TABLET BY MOUTH TWICE DAILY Patient not taking: Reported on 10/29/2016 07/07/16   Biagio Borg, MD  potassium chloride (K-DUR) 10 MEQ tablet TAKE 1 TABLET BY MOUTH TWICE DAILY Patient not taking: Reported on 10/29/2016 08/15/16   Biagio Borg, MD  propranolol (INDERAL) 10 MG tablet Take 10 mg by mouth 2 (two) times daily.     Historical Provider, MD  spironolactone (ALDACTONE) 100 MG tablet Take 50-100 mg by mouth 2 (two) times daily. 100 mg in the morning and 50 mg in the evening    Historical Provider, MD  zinc gluconate 50 MG tablet Take 50 mg by mouth 3 (three) times daily.     Historical Provider, MD      Family History  Problem Relation Age of Onset  . Heart disease Other   . Diabetes Father   . Dementia Mother   . Glaucoma Brother      Social History  Substance Use Topics  . Smoking status: Current Every Day Smoker    Packs/day: 0.25    Types: Cigarettes  . Smokeless tobacco: Never Used  . Alcohol use No     Comment: 3-4 bottles a week, quit drinking 2005     Allergies     Codeine    Review of Systems  10 Systems reviewed and are negative for acute change except as noted in the HPI.   Physical Exam Updated Vital Signs BP 96/83   Pulse 71   Temp 98.2 F (36.8 C) (Oral)   Resp 22   Ht 6' (1.829 m)   Wt 175 lb (79.4 kg)   SpO2 100%   BMI 23.73 kg/m   Physical Exam  Constitutional: He is oriented to person, place, and time. He appears well-developed and well-nourished. No distress.  Awake, alert  HENT:  Head: Normocephalic.  Abrasions R lateral forehead and near L eyebrow  Eyes: Conjunctivae and EOM are normal. Pupils are equal, round, and reactive to light.  Neck:  In soft collar  Cardiovascular: Normal  rate, regular rhythm and normal heart sounds.   No murmur heard. Pulmonary/Chest: Effort normal and breath sounds normal. No respiratory distress.  Abdominal: Soft. Bowel sounds are normal. He exhibits no distension. There is no tenderness.  Musculoskeletal: He exhibits no edema.  Neurological: He is alert and oriented to person, place, and time. He has normal reflexes. No cranial nerve deficit. He exhibits normal muscle tone.  Fluent speech, no clonus 5/5 strength and normal sensation x all 4 extremities  Skin: Skin is warm  and dry.  Psychiatric: He has a normal mood and affect. Judgment and thought content normal.  Nursing note and vitals reviewed.     ED Treatments / Results  Labs (all labs ordered are listed, but only abnormal results are displayed) Labs Reviewed  COMPREHENSIVE METABOLIC PANEL - Abnormal; Notable for the following:       Result Value   Sodium 134 (*)    CO2 20 (*)    Calcium 10.7 (*)    Total Protein 6.0 (*)    Albumin 3.0 (*)    Total Bilirubin 3.1 (*)    All other components within normal limits  CBC WITH DIFFERENTIAL/PLATELET - Abnormal; Notable for the following:    RBC 4.05 (*)    HCT 38.7 (*)    RDW 15.9 (*)    Platelets 54 (*)    All other components within normal limits  PROTIME-INR - Abnormal; Notable for the following:    Prothrombin Time 19.7 (*)    All other components within normal limits  URINALYSIS, ROUTINE W REFLEX MICROSCOPIC - Abnormal; Notable for the following:    Color, Urine AMBER (*)    APPearance HAZY (*)    Protein, ur 30 (*)    Bacteria, UA RARE (*)    Squamous Epithelial / LPF 0-5 (*)    All other components within normal limits     EKG  EKG Interpretation  Date/Time:    Ventricular Rate:    PR Interval:    QRS Duration:   QT Interval:    QTC Calculation:   R Axis:     Text Interpretation:           Radiology Ct Head Wo Contrast  Result Date: 10/29/2016 CLINICAL DATA:  Seizures EXAM: CT HEAD WITHOUT  CONTRAST CT CERVICAL SPINE WITHOUT CONTRAST TECHNIQUE: Multidetector CT imaging of the head and cervical spine was performed following the standard protocol without intravenous contrast. Multiplanar CT image reconstructions of the cervical spine were also generated. COMPARISON:  None. FINDINGS: CT HEAD FINDINGS Brain: No evidence of acute infarction, hemorrhage, hydrocephalus, extra-axial collection or mass lesion/mass effect. Vascular: No hyperdense vessel or unexpected calcification. Skull: Normal. Negative for fracture or focal lesion. Sinuses/Orbits: No acute finding. Other: None. CT CERVICAL SPINE FINDINGS Alignment: Normal alignment of the cervical spine. The vertebral body heights and disc spaces are well preserved. Skull base and vertebrae: No acute fracture. No primary bone lesion or focal pathologic process. Soft tissues and spinal canal: No prevertebral fluid or swelling. No visible canal hematoma. Disc levels:  Degenerative disc disease is identified at C5-6. Upper chest: Negative. Other: None IMPRESSION: 1. No acute intracranial abnormality. 2. No evidence for cervical spine fracture or subluxation. Electronically Signed   By: Kerby Moors M.D.   On: 10/29/2016 17:35   Ct Cervical Spine Wo Contrast  Result Date: 10/29/2016 CLINICAL DATA:  Seizures EXAM: CT HEAD WITHOUT CONTRAST CT CERVICAL SPINE WITHOUT CONTRAST TECHNIQUE: Multidetector CT imaging of the head and cervical spine was performed following the standard protocol without intravenous contrast. Multiplanar CT image reconstructions of the cervical spine were also generated. COMPARISON:  None. FINDINGS: CT HEAD FINDINGS Brain: No evidence of acute infarction, hemorrhage, hydrocephalus, extra-axial collection or mass lesion/mass effect. Vascular: No hyperdense vessel or unexpected calcification. Skull: Normal. Negative for fracture or focal lesion. Sinuses/Orbits: No acute finding. Other: None. CT CERVICAL SPINE FINDINGS Alignment: Normal  alignment of the cervical spine. The vertebral body heights and disc spaces are well preserved. Skull base and vertebrae:  No acute fracture. No primary bone lesion or focal pathologic process. Soft tissues and spinal canal: No prevertebral fluid or swelling. No visible canal hematoma. Disc levels:  Degenerative disc disease is identified at C5-6. Upper chest: Negative. Other: None IMPRESSION: 1. No acute intracranial abnormality. 2. No evidence for cervical spine fracture or subluxation. Electronically Signed   By: Kerby Moors M.D.   On: 10/29/2016 17:35    Procedures Procedures (including critical care time) Procedures  Medications Ordered in ED  Medications - No data to display   Initial Impression / Assessment and Plan / ED Course  I have reviewed the triage vital signs and the nursing notes.  Pertinent labs & imaging results that were available during my care of the patient were reviewed by me and considered in my medical decision making (see chart for details).    PT w/ h/o seizures on keppra p/w seizure-like activity witnessed by friends today. He was awake and alert, GCS 15 with no complaints on arrival. Vital signs reassuring. Normal neurologic exam. He did have abrasions on his face, applied bacitracin. His labs here are consistent with his previous and CT of head and C-spine negative for acute injury. I discussed his presentation with neurology, Dr. Shon Hale, who recommended increasing his home dose of keppra to 1500mg  BID. Pt can split his pills at home and has voiced understanding of this plan. He has an appointment with his neurologist tomorrow. Given that he has been neurologically intact for several hours here with no complaints, I feel he is safe for discharge. Return precautions reviewed and patient discharged in satisfactory condition.  Final Clinical Impressions(s) / ED Diagnoses   Final diagnoses:  Seizure [facility])     Discharge Medication List as of 10/29/2016  7:05 PM           Sharlett Iles, MD 10/29/16 2319

## 2016-10-29 NOTE — ED Triage Notes (Signed)
Pt arrived from home via [facility] c/o seizure.  Pt was behind [place] fishing at pond with friends, friends saw down with tonic clonic jerking movements lasting 5 minutes roughly.  Has facial abrasions.  Current with meds, last seizure 6 weeks ago.

## 2016-10-29 NOTE — ED Notes (Signed)
admitting Provider at bedside. 

## 2016-10-30 ENCOUNTER — Observation Stay (HOSPITAL_BASED_OUTPATIENT_CLINIC_OR_DEPARTMENT_OTHER)
Admit: 2016-10-30 | Discharge: 2016-10-30 | Disposition: A | Discharge status: Home / Self Care | Payer: Medicare HMO | Attending: Internal Medicine | Admitting: Internal Medicine

## 2016-10-30 ENCOUNTER — Ambulatory Visit: Payer: Commercial Managed Care - HMO | Admitting: Nurse Practitioner

## 2016-10-30 DIAGNOSIS — F329 Major depressive disorder, single episode, unspecified: Secondary | ICD-10-CM | POA: Diagnosis not present

## 2016-10-30 DIAGNOSIS — F1210 Cannabis abuse, uncomplicated: Secondary | ICD-10-CM

## 2016-10-30 DIAGNOSIS — R569 Unspecified convulsions: Secondary | ICD-10-CM | POA: Diagnosis not present

## 2016-10-30 LAB — CBC WITH DIFFERENTIAL/PLATELET
Basophils Absolute: 0 10*3/uL (ref 0.0–0.1)
Basophils Relative: 0 %
Eosinophils Absolute: 0.1 10*3/uL (ref 0.0–0.7)
Eosinophils Relative: 2 %
HCT: 32.7 % — ABNORMAL LOW (ref 39.0–52.0)
Hemoglobin: 11.2 g/dL — ABNORMAL LOW (ref 13.0–17.0)
Lymphocytes Relative: 28 %
Lymphs Abs: 1 10*3/uL (ref 0.7–4.0)
MCH: 32.9 pg (ref 26.0–34.0)
MCHC: 34.3 g/dL (ref 30.0–36.0)
MCV: 96.2 fL (ref 78.0–100.0)
Monocytes Absolute: 0.4 10*3/uL (ref 0.1–1.0)
Monocytes Relative: 13 %
Neutro Abs: 1.9 10*3/uL (ref 1.7–7.7)
Neutrophils Relative %: 56 %
Platelets: 38 10*3/uL — ABNORMAL LOW (ref 150–400)
RBC: 3.4 MIL/uL — ABNORMAL LOW (ref 4.22–5.81)
RDW: 16.1 % — ABNORMAL HIGH (ref 11.5–15.5)
WBC: 3.4 10*3/uL — ABNORMAL LOW (ref 4.0–10.5)

## 2016-10-30 LAB — COMPREHENSIVE METABOLIC PANEL
ALT: 21 U/L (ref 17–63)
AST: 29 U/L (ref 15–41)
Albumin: 2.4 g/dL — ABNORMAL LOW (ref 3.5–5.0)
Alkaline Phosphatase: 91 U/L (ref 38–126)
Anion gap: 3 — ABNORMAL LOW (ref 5–15)
BUN: 11 mg/dL (ref 6–20)
CO2: 24 mmol/L (ref 22–32)
Calcium: 9.5 mg/dL (ref 8.9–10.3)
Chloride: 110 mmol/L (ref 101–111)
Creatinine, Ser: 0.86 mg/dL (ref 0.61–1.24)
GFR calc Af Amer: >60 mL/min (ref >60)
GFR calc non Af Amer: >60 mL/min (ref >60)
Glucose, Bld: 112 mg/dL — ABNORMAL HIGH (ref 65–99)
Potassium: 4 mmol/L (ref 3.5–5.1)
Sodium: 137 mmol/L (ref 135–145)
Total Bilirubin: 1.7 mg/dL — ABNORMAL HIGH (ref 0.3–1.2)
Total Protein: 4.7 g/dL — ABNORMAL LOW (ref 6.5–8.1)

## 2016-10-30 LAB — RAPID URINE DRUG SCREEN, HOSP PERFORMED
Amphetamines: NOT DETECTED
Barbiturates: NOT DETECTED
Benzodiazepines: NOT DETECTED
Cocaine: NOT DETECTED
Opiates: NOT DETECTED
Tetrahydrocannabinol: POSITIVE — AB

## 2016-10-30 LAB — PROTIME-INR
INR: 1.74
Prothrombin Time: 20.5 seconds — ABNORMAL HIGH (ref 11.4–15.2)

## 2016-10-30 MED ORDER — LEVETIRACETAM 750 MG PO TABS: 1500.0000 mg | ORAL_TABLET | Freq: Two times a day (BID) | ORAL | 0 refills | Status: DC

## 2016-10-30 NOTE — Care Management Note (Signed)
Case Management Note  Patient Details  Name: [name] MRN: [id number] Date of Birth: 1966-02-09  Subjective/Objective:         Patient presented with Increased seizure activity. CM will follow for discharge needs pending patient's progress and physician orders.            Action/Plan:   Expected Discharge Date:                  Expected Discharge Plan:     In-House Referral:     Discharge planning Services     Post Acute Care Choice:    Choice offered to:     DME Arranged:    DME Agency:     HH Arranged:    HH Agency:     Status of Service:     If discussed at [employer] of Stay Meetings, dates discussed:    Additional Comments:  Rolm Baptise, RN 10/30/2016, 12:06 PM

## 2016-10-30 NOTE — Care Management Note (Signed)
Case Management Note  Patient Details  Name: [name] MRN: [id number] Date of Birth: 1965/11/15  Subjective/Objective:                    Action/Plan: Pt discharging home with self care. Pt with insurance and PCP. No further needs per CM.  Expected Discharge Date:  10/30/16               Expected Discharge Plan:  Home/Self Care  In-House Referral:     Discharge planning Services     Post Acute Care Choice:    Choice offered to:     DME Arranged:    DME Agency:     HH Arranged:    HH Agency:     Status of Service:  Completed, signed off  If discussed at [employer] of Stay Meetings, dates discussed:    Additional Comments:  Pollie Friar, RN 10/30/2016, 4:21 PM

## 2016-10-30 NOTE — Procedures (Signed)
ELECTROENCEPHALOGRAM REPORT  Date of Study: 10/30/2016  Patient's Name: [name] MRN: [id number] Date of Birth: 1966-06-08  Referring Provider: Dr. Eulogio Bear  Clinical History: This is a 51 year old man with a history of seizures with an increase in seizures  Medications: levETIRAcetam (KEPPRA) tablet 1,500 mg  acetaminophen (TYLENOL) tablet 325 mg   furosemide (LASIX) tablet 60 mg  lactulose (CHRONULAC) 10 GM/15ML solution 10 g  propranolol (INDERAL) tablet 10 mg  spironolactone (ALDACTONE) tablet 100 mg   Technical Summary: A multichannel digital EEG recording measured by the international 10-20 system with electrodes applied with paste and impedances below 5000 ohms performed in our laboratory with EKG monitoring in an awake and asleep patient.  Hyperventilation was not performed. Photic stimulation was performed.  The digital EEG was referentially recorded, reformatted, and digitally filtered in a variety of bipolar and referential montages for optimal display.    Description: The patient is awake and asleep during the recording.  During maximal wakefulness, there is a symmetric, medium voltage 9.5 Hz posterior dominant rhythm that attenuates with eye opening.  There is occasional focal 5-6 Hz theta slowing seen over the left temporal region. During drowsiness and sleep, there is an increase in theta slowing of the background with poorly formed vertex waves seen.  Photic stimulation did not elicit any abnormalities.  There were no epileptiform discharges or electrographic seizures seen.    EKG lead showed sinus bradycardia at 54 bpm.  Impression: This awake and asleep EEG is abnormal due to occasional focal slowing over the left temporal  region.  Clinical Correlation of the above findings indicates focal cerebral dysfunction over the left temporal region suggestive of underlying structural or physiologic abnormality. The absence of epileptiform discharges does not  exclude a clinical diagnosis of epilepsy. Clinical correlation is advised.   Ellouise Newer, M.D.

## 2016-10-30 NOTE — Progress Notes (Signed)
Patient given discharge instructions.  All questions and concerns addressed.  Patient left unit by wheelchair to private car.

## 2016-10-30 NOTE — Care Management Obs Status (Signed)
[place] NOTIFICATION   Patient Details  Name: [name] MRN: [id number] Date of Birth: October 31, 1965   Medicare Observation Status Notification Given:  Yes    Pollie Friar, RN 10/30/2016, 1:17 PM

## 2016-10-30 NOTE — Discharge Summary (Signed)
Physician Discharge Summary  [name] [id number] DOB: Jun 28, 1966 DOA: 10/29/2016  PCP: Cathlean Cower, MD  Admit date: 10/29/2016 Discharge date: 10/30/2016   Recommendations for Outpatient Follow-Up:   1. Increased keppra 2. Seizure precautions 3. illegal drug cessation   Discharge Diagnosis:   Active Problems:   Seizure [facility])   Discharge disposition:  Home  Discharge Condition: Improved.  Diet recommendation: Low sodium, heart healthy  Wound care: None.   History of Present Illness:   [name] is 51 year old Caucasian male past medical history significant for seizure disorder currently on Keppra therapy, remote history of alcohol abuse (no alcohol use since 2005), hyperlipidemia, cirrhosis complicated by [facility], depression. Patient presented to the emergency department complaining of increased seizure activity. Upon initial presentation to the emergency department, patient was hemodynamically stable. After obtaining laboratory work and CT imaging of the head, ER contacted neurology, who recommended increasing Keppra dose to 1500 mg twice a day. This course of action was taken and patient was discharged from the ER. However, patient reports having another seizure at home, which prompted a second visit to the ER. Patient reports recently smoking marijuana and "staying up all night." He believes that sleep deprivation was the probable cause of his increased seizure activity. The first seizure lasted approximate 5 minutes. No loss of bowel or bladder function. Patient reports striking his head on the ground and had a minor abrasion to his right forehead. The second seizure lasted approximately 1 minute. Again, no loss of bowel or bladder function. Both seizures were tonic-clonic in nature. Patient reports normal sensation and strength. He denies dizziness, chest pain, shortness of breath. Otherwise, patient reports being in his normal state of health. Aside from the marijuana  use, patient denies anyother drug use. No recent change in medications. No fever chills at home.   Hospital Course by Problem:    Increased seizure activity (tonic clonic) Marijuana cessation -increase keppra to BID 1500mg  -EEG discussed with on-call neuro-- prob temporal lobe focus-- no need for MRI as patient back to baseline  Cirrhosis complicated by [facility].  continue lactulose, Lasix, and spironolactone.  depression Patient has normal mood and affect. Does not appear to be taking SSRI or SSRI. Plan is to defer to PCP.    Medical Consultants:    None.   Discharge Exam:   Vitals:   10/30/16 0916 10/30/16 1425  BP: (!) 96/43 (!) 92/35  Pulse: (!) 50   Resp: 16 18  Temp: 98.3 F (36.8 C) 97.9 F (36.6 C)   Vitals:   10/30/16 0154 10/30/16 0508 10/30/16 0916 10/30/16 1425  BP: (!) 96/46 (!) 92/42 (!) 96/43 (!) 92/35  Pulse: (!) 58 (!) 48 (!) 50   Resp: 19 19 16 18   Temp: 97.7 F (36.5 C) 97.7 F (36.5 C) 98.3 F (36.8 C) 97.9 F (36.6 C)  TempSrc: Oral Oral Oral Oral  SpO2: 98% 97% 98% 98%  Weight:      Height:        Gen:  NAD    The results of significant diagnostics from this hospitalization (including imaging, microbiology, ancillary and laboratory) are listed below for reference.     Procedures and Diagnostic Studies:   No results found.   Labs:   Basic Metabolic Panel:  Recent Labs Lab 10/29/16 1611 10/30/16 0440  NA 134* 137  K 4.2 4.0  CL 105 110  CO2 20* 24  GLUCOSE 87 112*  BUN 12 11  CREATININE 0.86 0.86  CALCIUM  10.7* 9.5   GFR Estimated Creatinine Clearance: 110.3 mL/min (by C-G formula based on SCr of 0.86 mg/dL). Liver Function Tests:  Recent Labs Lab 10/29/16 1611 10/30/16 0440  AST 36 29  ALT 24 21  ALKPHOS 112 91  BILITOT 3.1* 1.7*  PROT 6.0* 4.7*  ALBUMIN 3.0* 2.4*   No results for input(s): LIPASE, AMYLASE in the last 168 hours. No results for input(s): AMMONIA in the last 168 hours. Coagulation  profile  Recent Labs Lab 10/29/16 1611 10/30/16 0440  INR 1.65 1.74    CBC:  Recent Labs Lab 10/29/16 1611 10/30/16 0440  WBC 5.6 3.4*  NEUTROABS 3.2 1.9  HGB 13.3 11.2*  HCT 38.7* 32.7*  MCV 95.6 96.2  PLT 54* 38*   Cardiac Enzymes: No results for input(s): CKTOTAL, CKMB, CKMBINDEX, TROPONINI in the last 168 hours. BNP: Invalid input(s): POCBNP CBG: No results for input(s): GLUCAP in the last 168 hours. D-Dimer No results for input(s): DDIMER in the last 72 hours. Hgb A1c No results for input(s): HGBA1C in the last 72 hours. Lipid Profile No results for input(s): CHOL, HDL, LDLCALC, TRIG, CHOLHDL, LDLDIRECT in the last 72 hours. Thyroid function studies No results for input(s): TSH, T4TOTAL, T3FREE, THYROIDAB in the last 72 hours.  Invalid input(s): FREET3 Anemia work up No results for input(s): VITAMINB12, FOLATE, FERRITIN, TIBC, IRON, RETICCTPCT in the last 72 hours. Microbiology No results found for this or any previous visit (from the past 240 hour(s)).   Discharge Instructions:   Discharge Instructions    Diet - low sodium heart healthy    Complete by:  As directed    Discharge instructions    Complete by:  As directed    Seizure precautions: no driving, avoid marijuana and other illegal drugs -reschedule outpatient follow up with Dr. Jannifer Franklin   Increase activity slowly    Complete by:  As directed      Allergies as of 10/30/2016      Reactions   Codeine Nausea And Vomiting   Passed out, was also drinking at the time      Medication List    TAKE these medications   acetaminophen 500 MG tablet Commonly known as:  TYLENOL Take 500 mg by mouth every 6 (six) hours as needed (pain). Take 3 tabs as needed for pain.   furosemide 20 MG tablet Commonly known as:  LASIX Take 60 mg by mouth 2 (two) times daily.   lactulose 10 g packet Commonly known as:  CEPHULAC Take 10 g by mouth daily as needed (toxin buildup).   levETIRAcetam 750 MG  tablet Commonly known as:  KEPPRA Take 2 tablets (1,500 mg total) by mouth 2 (two) times daily. What changed:  medication strength  how much to take   MILK THISTLE PO Take 1 tablet by mouth 2 (two) times daily.   potassium chloride 10 MEQ tablet Commonly known as:  K-DUR TAKE 1 TABLET BY MOUTH TWICE DAILY What changed:  Another medication with the same name was removed. Continue taking this medication, and follow the directions you see here.   propranolol 10 MG tablet Commonly known as:  INDERAL Take 10 mg by mouth 2 (two) times daily.   spironolactone 100 MG tablet Commonly known as:  ALDACTONE Take 50-100 mg by mouth 2 (two) times daily. 100 mg in the morning and 50 mg in the evening   zinc gluconate 50 MG tablet Take 50 mg by mouth 3 (three) times daily.  Follow-up Information    Cathlean Cower, MD Follow up in 1 week(s).   Specialties:  Internal Medicine, Radiology Contact information: [place] [address] [phone]        Lenor Coffin, MD Follow up in 1 week(s).   Specialty:  Neurology Contact information: [address] [place] [address] [phone]            Time coordinating discharge: 25 min  Signed:  Geradine Girt   [facility] 10/30/2016, 3:31 PM

## 2016-10-30 NOTE — Discharge Instructions (Signed)
Epilepsy Epilepsy is when a person keeps having seizures. A seizure is unusual activity in the brain. A seizure can change how you think or behave, and it can make it hard to be aware of what is happening. This condition can cause problems, such as:  Falls, accidents, and injury.  Depression.  Poor memory.  Sudden unexplained death in epilepsy (SUDEP). This is rare. Its cause is not known. Most people with epilepsy lead normal lives. Follow these instructions at home: Medicines  Take medicines only as told by your doctor.  Avoid anything that may keep your medicine from working, such as alcohol. Activity  Get enough rest. Lack of sleep can make seizures more likely to occur.  Follow your doctors advice about driving, swimming, and doing anything else that would be dangerous if you had a seizure. Teaching others  Teach friends and family what to do if you have a seizure. They should:  Lay you on the ground to prevent a fall.  Cushion your head and body.  Loosen any tight clothing around your neck.  Turn you on your side.  Stay with you until you are better.  Not hold you down.  Not put anything in your mouth.  Know whether or not you need emergency care. General instructions  Avoid anything that causes you to have seizures.  Keep a seizure diary. Write down what you remember about each seizure, and especially what might have caused it.  Keep all follow-up visits as told by your doctor. This is important. Contact a doctor if:  You have a change in your seizure pattern.  You get an infection or start to feel sick. You may have more seizures when you are sick. Get help right away if:  A seizure does not stop after 5 minutes.  You have more than one seizure in a row, and you do not have enough time between the seizures to feel better.  A seizure makes it harder to breathe.  A seizure is different from other seizures you have had.  A seizure makes you unable  to speak or use a part of your body.  You did not wake up right after a seizure. This information is not intended to replace advice given to you by your health care provider. Make sure you discuss any questions you have with your health care provider. Document Released: 06/18/2009 Document Revised: 03/27/2016 Document Reviewed: 02/29/2016 Elsevier Interactive Patient Education  2017 [place]. Follow with Cathlean Cower, MD in 5-7 days  Please get a complete blood count and chemistry panel checked by your Primary MD at your next visit, and again as instructed by your Primary MD. Please get your medications reviewed and adjusted by your Primary MD.  Please request your Primary MD to go over all Hospital Tests and Procedure/Radiological results at the follow up, please get all Hospital records sent to your Prim MD by signing hospital release before you go home.  If you had Pneumonia of Lung problems at the Hospital: Please get a 2 view Chest X ray done in 6-8 weeks after hospital discharge or sooner if instructed by your Primary MD.  If you have Congestive Heart Failure: Please call your Cardiologist or Primary MD anytime you have any of the following symptoms:  1) 3 pound weight gain in 24 hours or 5 pounds in 1 week  2) shortness of breath, with or without a dry hacking cough  3) swelling in the hands, feet or stomach  4) if  you have to sleep on extra pillows at night in order to breathe  Follow cardiac low salt diet and 1.5 lit/day fluid restriction.  If you have diabetes Accuchecks 4 times/day, Once in AM empty stomach and then before each meal. Log in all results and show them to your primary doctor at your next visit. If any glucose reading is under 80 or above 300 call your primary MD immediately.  If you have Seizure/Convulsions/Epilepsy: Please do not drive, operate heavy machinery, participate in activities at heights or participate in high speed sports until you have seen by  Primary MD or a Neurologist and advised to do so again.  If you had Gastrointestinal Bleeding: Please ask your Primary MD to check a complete blood count within one week of discharge or at your next visit. Your endoscopic/colonoscopic biopsies that are pending at the time of discharge, will also need to followed by your Primary MD.  Get Medicines reviewed and adjusted. Please take all your medications with you for your next visit with your Primary MD  Please request your Primary MD to go over all hospital tests and procedure/radiological results at the follow up, please ask your Primary MD to get all Hospital records sent to his/her office.  If you experience worsening of your admission symptoms, develop shortness of breath, life threatening emergency, suicidal or homicidal thoughts you must seek medical attention immediately by calling 911 or calling your MD immediately  if symptoms less severe.  You must read complete instructions/literature along with all the possible adverse reactions/side effects for all the Medicines you take and that have been prescribed to you. Take any new Medicines after you have completely understood and accpet all the possible adverse reactions/side effects.   Do not drive or operate heavy machinery when taking Pain medications.   Do not take more than prescribed Pain, Sleep and Anxiety Medications  Special Instructions: If you have smoked or chewed Tobacco  in the last 2 yrs please stop smoking, stop any regular Alcohol  and or any Recreational drug use.  Wear Seat belts while driving.  Please note You were cared for by a hospitalist during your hospital stay. If you have any questions about your discharge medications or the care you received while you were in the hospital after you are discharged, you can call the unit and asked to speak with the hospitalist on call if the hospitalist that took care of you is not available. Once you are discharged, your primary  care physician will handle any further medical issues. Please note that NO REFILLS for any discharge medications will be authorized once you are discharged, as it is imperative that you return to your primary care physician (or establish a relationship with a primary care physician if you do not have one) for your aftercare needs so that they can reassess your need for medications and monitor your lab values.  You can reach the hospitalist office at phone [phone] or fax [phone]   If you do not have a primary care physician, you can call [phone] for a physician referral.  Activity: As tolerated with Full fall precautions use walker/cane & assistance as needed

## 2016-10-30 NOTE — Progress Notes (Signed)
EEG Completed; Results Pending  

## 2016-10-31 ENCOUNTER — Encounter: Payer: Self-pay | Admitting: Nurse Practitioner

## 2016-11-07 ENCOUNTER — Ambulatory Visit (INDEPENDENT_AMBULATORY_CARE_PROVIDER_SITE_OTHER): Payer: Medicare HMO | Admitting: Internal Medicine

## 2016-11-07 ENCOUNTER — Encounter: Payer: Self-pay | Admitting: Internal Medicine

## 2016-11-07 VITALS — BP 102/72 | HR 48 | Temp 97.6°F | Ht 72.0 in | Wt 169.0 lb

## 2016-11-07 DIAGNOSIS — F1011 Alcohol abuse, in remission: Secondary | ICD-10-CM

## 2016-11-07 DIAGNOSIS — R569 Unspecified convulsions: Secondary | ICD-10-CM | POA: Diagnosis not present

## 2016-11-07 DIAGNOSIS — F3160 Bipolar disorder, current episode mixed, unspecified: Secondary | ICD-10-CM | POA: Diagnosis not present

## 2016-11-07 DIAGNOSIS — Z87898 Personal history of other specified conditions: Secondary | ICD-10-CM

## 2016-11-07 MED ORDER — ESCITALOPRAM OXALATE 10 MG PO TABS: 10.0000 mg | ORAL_TABLET | Freq: Every day | ORAL | 3 refills | Status: DC

## 2016-11-07 NOTE — Progress Notes (Signed)
Subjective:    Patient ID: [name], male    DOB: 03/01/1966, 51 y.o.   MRN: [id number]  HPI  Here to f/u recent hospn after siezure requiring hospn 2/25 - 2/26, has had incresaed anxiety and stress after last yr diagnosis of a liver related condition he was told would require chemotherapy, so became very stressed due to this.  Hs not yet been called x 2 wks per [facility] MD - Dr Dory Horn per pt with the treatment plan (so pt states today he plans to call himself soon for clarification.)  BP too low on 10 bid inderal and feeling weak, so only taking half.  Pt denies chest pain, increased sob or doe, wheezing, orthopnea, PND, increased LE swelling, palpitations, dizziness or syncope.  Pt denies new neurological symptoms such as new headache, or facial or extremity weakness or numbness   Pt denies polydipsia, polyuria, Denies worsening depressive symptoms, suicidal ideation, or panic, but willing to try SSRI trial.  Has lost some wt recently Wt Readings from Last 3 Encounters:  11/07/16 169 lb (76.7 kg)  10/29/16 171 lb 14.4 oz (78 kg)  10/29/16 175 lb (79.4 kg)   Past Medical History:  Diagnosis Date  . Abuse, drug or alcohol   . Bipolar affective disorder ([place])    [place]  . Cancer [facility])    liver cancer treated with micrablation  . Chronic liver disease and cirrhosis   . Depression   . DJD (degenerative joint disease)    right knee  . Dyslipidemia   . Encephalopathy   . Esophageal varices ([place])   . Hernia   . History of alcohol abuse   . PONV (postoperative nausea and vomiting)   . Seizures ([place])    Dr Jannifer Franklin  . Thrombocytopenia ([place])   . Wilson disease    Past Surgical History:  Procedure Laterality Date  . ESOPHAGOGASTRODUODENOSCOPY    . HARDWARE REMOVAL Right 01/22/2015   Procedure: HARDWARE REMOVAL RIGHT LEG;  Surgeon: Leandrew Koyanagi, MD;  Location: [place];  Service: Orthopedics;  Laterality: Right;  . left inguinal hernia  june 2012   [place]  .  liver cancer  02/2015   ablation  . microablation of liver    . OPEN REDUCTION INTERNAL FIXATION (ORIF) TIBIA/FIBULA FRACTURE Right 07/25/2013   Procedure: OPEN REDUCTION INTERNAL FIXATION (ORIF) RIGHT TIBIAL PLATEAU, TIBIAL SHAFT AND FIBULA FRACTURES, POSSIBLE FASCIOTOMIES;  Surgeon: Marianna Payment, MD;  Location: [place];  Service: Orthopedics;  Laterality: Right;  . TOTAL KNEE ARTHROPLASTY Right 07/15/2015   Procedure: RIGHT TOTAL KNEE ARTHROPLASTY;  Surgeon: Leandrew Koyanagi, MD;  Location: [place];  Service: Orthopedics;  Laterality: Right;  [name] VASCULAR SURGERY      reports that he has been smoking Cigarettes.  He has been smoking about 0.25 packs per day. He has never used smokeless tobacco. He reports that he does not drink alcohol or use drugs. family history includes Dementia in his mother; Diabetes in his father; Glaucoma in his brother; Heart disease in his other. Allergies  Allergen Reactions  . Codeine Nausea And Vomiting    Passed out, was also drinking at the time     Current Outpatient Prescriptions on File Prior to Visit  Medication Sig Dispense Refill  . acetaminophen (TYLENOL) 500 MG tablet Take 500 mg by mouth every 6 (six) hours as needed (pain). Take 3 tabs as needed for pain.    [name] lactulose (CEPHULAC) 10 G packet  Take 10 g by mouth daily as needed (toxin buildup).     [name] levETIRAcetam (KEPPRA) 750 MG tablet Take 2 tablets (1,500 mg total) by mouth 2 (two) times daily. 60 tablet 0  . MILK THISTLE PO Take 1 tablet by mouth 2 (two) times daily.    . potassium chloride (K-DUR) 10 MEQ tablet TAKE 1 TABLET BY MOUTH TWICE DAILY 60 tablet 11  . propranolol (INDERAL) 10 MG tablet Take 10 mg by mouth 2 (two) times daily.     [name] zinc gluconate 50 MG tablet Take 50 mg by mouth 3 (three) times daily.      No current facility-administered medications on file prior to visit.    Review of Systems  Constitutional: Negative for unusual diaphoresis or night sweats HENT: Negative for ear  swelling or discharge Eyes: Negative for worsening visual haziness  Respiratory: Negative for choking and stridor.   Gastrointestinal: Negative for distension or worsening eructation Genitourinary: Negative for retention or change in urine volume.  Musculoskeletal: Negative for other MSK pain or swelling Skin: Negative for color change and worsening wound Neurological: Negative for tremors and numbness other than noted  Psychiatric/Behavioral: Negative for decreased concentration or agitation other than above   All other system neg per pt    Objective:   Physical Exam BP 102/72   Pulse (!) 48   Temp 97.6 F (36.4 C)   Ht 6' (1.829 m)   Wt 169 lb (76.7 kg)   SpO2 98%   BMI 22.92 kg/m  VS noted, relatively thin for ht Constitutional: Pt appears in no apparent distress HENT: Head: NCAT.  Right Ear: External ear normal.  Left Ear: External ear normal.  Eyes: . Pupils are equal, round, and reactive to light. Conjunctivae and EOM are normal Neck: Normal range of motion. Neck supple.  Cardiovascular: Normal rate and regular rhythm.   Pulmonary/Chest: Effort normal and breath sounds without rales or wheezing.  Neurological: Pt is alert. Not confused , motor grossly intact Skin: Skin is warm. No rash, no LE edema Psychiatric: Pt behavior is normal. No agitation. 1-2+ nervous, not depressed affect    Assessment & Plan:

## 2016-11-07 NOTE — Patient Instructions (Signed)
Please take all new medication as prescribed - the lexapro 10 mg per day  Please continue all other medications as before, and refills have been done if requested.  Please have the pharmacy call with any other refills you may need.  Please keep your appointments with your specialists as you may have planned

## 2016-11-12 NOTE — Assessment & Plan Note (Signed)
Urged to abstain 

## 2016-11-12 NOTE — Assessment & Plan Note (Signed)
Pt to f/u with [facility] liver GI as planned

## 2016-11-12 NOTE — Assessment & Plan Note (Signed)
Stable post hospn, cont to follow

## 2016-11-12 NOTE — Assessment & Plan Note (Signed)
Ok for trial lexapro 10 qd, consider psychiatric referral

## 2016-11-15 ENCOUNTER — Telehealth: Payer: Self-pay | Admitting: Neurology

## 2016-11-15 ENCOUNTER — Ambulatory Visit: Payer: Commercial Managed Care - HMO | Admitting: Neurology

## 2016-11-15 NOTE — Telephone Encounter (Signed)
This patient did not show for a revisit on today, this represents the third no-show in the last 5 weeks. The patient will be discharged from our practice.

## 2016-11-16 ENCOUNTER — Encounter: Payer: Self-pay | Admitting: Neurology

## 2016-11-17 DIAGNOSIS — C220 Liver cell carcinoma: Secondary | ICD-10-CM | POA: Diagnosis not present

## 2016-12-01 DIAGNOSIS — F312 Bipolar disorder, current episode manic severe with psychotic features: Secondary | ICD-10-CM | POA: Diagnosis not present

## 2016-12-04 DIAGNOSIS — F312 Bipolar disorder, current episode manic severe with psychotic features: Secondary | ICD-10-CM | POA: Diagnosis not present

## 2016-12-10 ENCOUNTER — Encounter (HOSPITAL_COMMUNITY): Payer: Self-pay | Admitting: Emergency Medicine

## 2016-12-10 ENCOUNTER — Emergency Department (HOSPITAL_COMMUNITY): Payer: Medicare HMO

## 2016-12-10 ENCOUNTER — Emergency Department (HOSPITAL_COMMUNITY)
Admission: EM | Admit: 2016-12-10 | Discharge: 2016-12-10 | Disposition: A | Discharge status: Home / Self Care | Payer: Medicare HMO | Attending: Emergency Medicine | Admitting: Emergency Medicine

## 2016-12-10 DIAGNOSIS — Z8505 Personal history of malignant neoplasm of liver: Secondary | ICD-10-CM | POA: Diagnosis not present

## 2016-12-10 DIAGNOSIS — W19XXXA Unspecified fall, initial encounter: Secondary | ICD-10-CM | POA: Diagnosis not present

## 2016-12-10 DIAGNOSIS — S62101A Fracture of unspecified carpal bone, right wrist, initial encounter for closed fracture: Secondary | ICD-10-CM

## 2016-12-10 DIAGNOSIS — F17210 Nicotine dependence, cigarettes, uncomplicated: Secondary | ICD-10-CM | POA: Diagnosis not present

## 2016-12-10 DIAGNOSIS — Z96651 Presence of right artificial knee joint: Secondary | ICD-10-CM | POA: Insufficient documentation

## 2016-12-10 DIAGNOSIS — Z79899 Other long term (current) drug therapy: Secondary | ICD-10-CM | POA: Diagnosis not present

## 2016-12-10 DIAGNOSIS — Y999 Unspecified external cause status: Secondary | ICD-10-CM | POA: Insufficient documentation

## 2016-12-10 DIAGNOSIS — S6991XA Unspecified injury of right wrist, hand and finger(s), initial encounter: Secondary | ICD-10-CM | POA: Diagnosis present

## 2016-12-10 DIAGNOSIS — Y929 Unspecified place or not applicable: Secondary | ICD-10-CM | POA: Insufficient documentation

## 2016-12-10 DIAGNOSIS — S52571A Other intraarticular fracture of lower end of right radius, initial encounter for closed fracture: Secondary | ICD-10-CM | POA: Insufficient documentation

## 2016-12-10 DIAGNOSIS — Y939 Activity, unspecified: Secondary | ICD-10-CM | POA: Insufficient documentation

## 2016-12-10 MED ORDER — OXYCODONE-ACETAMINOPHEN 5-325 MG PO TABS: 2.0000 | ORAL_TABLET | Freq: Four times a day (QID) | ORAL | 0 refills | Status: DC | PRN

## 2016-12-10 MED ORDER — OXYCODONE-ACETAMINOPHEN 5-325 MG PO TABS
2.0000 | ORAL_TABLET | Freq: Once | ORAL | Status: AC
  Administered 2016-12-10: 2 via ORAL
  Filled 2016-12-10: qty 2

## 2016-12-10 NOTE — ED Triage Notes (Signed)
Tripped and fell and hurt his rt wrist has good pulse , good  Neuro , can wiggle fingers and < 3 sec cap refill

## 2016-12-10 NOTE — ED Provider Notes (Signed)
[facility] DEPT Provider Note   CSN: [id number] Arrival date & time: 12/10/16  1421  By signing my name below, I, Julien Nordmann, attest that this documentation has been prepared under the direction and in the presence of Montine Circle, PA-C.  Electronically Signed: Julien Nordmann, ED Scribe. 12/10/16. 4:23 PM.    History   Chief Complaint Chief Complaint  Patient presents with  . Wrist Pain  . Fall   The history is provided by the patient. No language interpreter was used.   HPI Comments: [name] is a 51 y.o. male who has a Pmhx of liver cancer, DJD, DLD, encephalopathy, seizures, Wilson disease and thromocytopenia presents to the Emergency Department complaining of moderate, persistent right wrist pain s/p a fall that occurred ~ 3 hours ago. He has associated swelling to the area. Pt reports that he tripped outside earlier the steps outside his house and tried to catch himself with his right wrist. He notes upon impact he "rolled" over on his right wrist. Pt did not hit his head or lose consciousness. He has not taken any medication to alleviate his pain. Pt denies numbness.   PCP: Cathlean Cower, MD  Past Medical History:  Diagnosis Date  . Abuse, drug or alcohol   . Bipolar affective disorder ([place])    [place]  . Cancer [facility])    liver cancer treated with micrablation  . Chronic liver disease and cirrhosis   . Depression   . DJD (degenerative joint disease)    right knee  . Dyslipidemia   . Encephalopathy   . Esophageal varices ([place])   . Hernia   . History of alcohol abuse   . PONV (postoperative nausea and vomiting)   . Seizures ([place])    Dr Jannifer Franklin  . Thrombocytopenia ([place])   . [place] disease     Patient Active Problem List   Diagnosis Date Noted  . Seizure ([place]) 10/29/2016  . Possible exposure to STD 06/07/2016  . S/P total knee replacement using cement 07/15/2015  . Chest pain 04/30/2015  . Left inguinal hernia 10/22/2014  .  Hyponatremia 05/06/2014  . Diarrhea 04/30/2014  . UTI (urinary tract infection) 07/29/2013  . Tibial plateau fracture, tibial shaft fracture 07/24/2013  . Erectile dysfunction 01/07/2013  . Hypokalemia 10/21/2011  . Eczema 10/21/2011  . Impaired glucose tolerance 10/19/2011  . Hepatic encephalopathy ([place]) 08/05/2011  . Depression   . Seizures ([place])   . Wilson disease   . Encephalopathy   . Thrombocytopenia ([place])   . Chronic liver disease and cirrhosis ([place])   . History of alcohol abuse   . Encounter for well adult exam with abnormal findings 04/14/2011  . Bipolar affective disorder [facility])     Past Surgical History:  Procedure Laterality Date  . ESOPHAGOGASTRODUODENOSCOPY    . HARDWARE REMOVAL Right 01/22/2015   Procedure: HARDWARE REMOVAL RIGHT LEG;  Surgeon: Leandrew Koyanagi, MD;  Location: [place];  Service: Orthopedics;  Laterality: Right;  . left inguinal hernia  june 2012   [place]  . liver cancer  02/2015   ablation  . microablation of liver    . OPEN REDUCTION INTERNAL FIXATION (ORIF) TIBIA/FIBULA FRACTURE Right 07/25/2013   Procedure: OPEN REDUCTION INTERNAL FIXATION (ORIF) RIGHT TIBIAL PLATEAU, TIBIAL SHAFT AND FIBULA FRACTURES, POSSIBLE FASCIOTOMIES;  Surgeon: Marianna Payment, MD;  Location: [facility];  Service: Orthopedics;  Laterality: Right;  . TOTAL KNEE ARTHROPLASTY Right 07/15/2015   Procedure: RIGHT TOTAL KNEE ARTHROPLASTY;  Surgeon: Leandrew Koyanagi, MD;  Location: [facility];  Service: Orthopedics;  Laterality: Right;  [name] VASCULAR SURGERY         Home Medications    Prior to Admission medications   Medication Sig Start Date End Date Taking? Authorizing Provider  acetaminophen (TYLENOL) 500 MG tablet Take 500 mg by mouth every 6 (six) hours as needed (pain). Take 3 tabs as needed for pain.    Historical Provider, MD  escitalopram (LEXAPRO) 10 MG tablet Take 1 tablet (10 mg total) by mouth daily. 11/07/16 02/05/17  Biagio Borg, MD  furosemide (LASIX) 20 MG tablet Take  three tablets by mouth twice daily at [place] and 6PM. 10/27/16   Historical Provider, MD  lactulose (CEPHULAC) 10 G packet Take 10 g by mouth daily as needed (toxin buildup).  07/03/11   Historical Provider, MD  levETIRAcetam (KEPPRA) 750 MG tablet Take 2 tablets (1,500 mg total) by mouth 2 (two) times daily. 10/30/16   Geradine Girt, DO  MILK THISTLE PO Take 1 tablet by mouth 2 (two) times daily.    Historical Provider, MD  potassium chloride (K-DUR) 10 MEQ tablet TAKE 1 TABLET BY MOUTH TWICE DAILY 06/28/15   Biagio Borg, MD  propranolol (INDERAL) 10 MG tablet Take 10 mg by mouth 2 (two) times daily.     Historical Provider, MD  spironolactone (ALDACTONE) 100 MG tablet Take 150 mg by mouth. 10/27/16   Historical Provider, MD  zinc gluconate 50 MG tablet Take 50 mg by mouth 3 (three) times daily.     Historical Provider, MD    Family History Family History  Problem Relation Age of Onset  . Heart disease Other   . Diabetes Father   . Dementia Mother   . Glaucoma Brother     Social History Social History  Substance Use Topics  . Smoking status: Current Every Day Smoker    Packs/day: 0.25    Types: Cigarettes  . Smokeless tobacco: Never Used  . Alcohol use No     Comment: 3-4 bottles a week, quit drinking 2005     Allergies   Codeine   Review of Systems Review of Systems  Musculoskeletal: Positive for arthralgias and joint swelling.  Neurological: Negative for numbness.     Physical Exam Updated Vital Signs BP (!) 102/53   Pulse (!) 52   Temp 97.8 F (36.6 C) (Oral)   Resp 16   SpO2 100%   Physical Exam Nursing note and vitals reviewed.  Constitutional: Pt appears well-developed and well-nourished. No distress.  HENT:  Head: Normocephalic and atraumatic.  Eyes: Conjunctivae are normal.  Neck: Normal range of motion.  Cardiovascular: Normal rate, regular rhythm. Intact distal pulses.   Capillary refill < 3 sec.  Pulmonary/Chest: Effort normal and breath sounds  normal.  Musculoskeletal:  Right upper extremity Pt exhibits TTP about the right wrist with moderate swelling.   ROM: deferred secondary to pain  Strength: deferred secondary to pain  Neurological: Pt  is alert. Coordination normal.  Sensation: 5/5 Skin: Skin is warm and dry. Pt is not diaphoretic.  No evidence of open wound or skin tenting Psychiatric: Pt has a normal mood and affect.    ED Treatments / Results  DIAGNOSTIC STUDIES: Oxygen Saturation is 100% on RA, normal by my interpretation.  COORDINATION OF CARE:  4:21 PM Will give pain medication and splint. Discussed treatment plan with pt at bedside and pt agreed to plan.  Labs (all labs ordered are  listed, but only abnormal results are displayed) Labs Reviewed - No data to display  EKG  EKG Interpretation None       Radiology Dg Wrist Complete Right  Result Date: 12/10/2016 CLINICAL DATA:  [place] today and injured right wrist. EXAM: RIGHT WRIST - COMPLETE 3+ VIEW COMPARISON:  None. FINDINGS: There is a comminuted intra-articular dorsally impacted distal radius fracture. Possible tiny ulnar styloid avulsion fracture. The carpal and metacarpal bones are intact. IMPRESSION: Comminuted, intra-articular, dorsally impacted distal radius fracture. Electronically Signed   By: Marijo Sanes M.D.   On: 12/10/2016 15:41    Procedures Procedures (including critical care time)   Medications Ordered in ED Medications - No data to display   Initial Impression / Assessment and Plan / ED Course  I have reviewed the triage vital signs and the nursing notes.  Pertinent labs & imaging results that were available during my care of the patient were reviewed by me and considered in my medical decision making (see chart for details).      Patient X-Ray positive for fracture.  Spoke with Dr. Erlinda Hong from ortho, who recommends sugar tong and follow-up in office on this week.  His office will call patient. Patient given splint while in ED,  conservative therapy recommended and discussed. Patient will be discharged home & is agreeable with above plan. Returns precautions discussed. Pt appears safe for discharge.  Final Clinical Impressions(s) / ED Diagnoses   Final diagnoses:  Closed fracture of right wrist, initial encounter    I personally performed the services described in this documentation, which was scribed in my presence. The recorded information has been reviewed and is accurate.    New Prescriptions New Prescriptions   No medications on file     Montine Circle, PA-C 12/10/16 [place], MD 12/10/16 [phone]

## 2016-12-10 NOTE — Progress Notes (Signed)
Orthopedic Tech Progress Note Patient Details:  [name] [facility] [birth year] [id number]  Ortho Devices Type of Ortho Device: Ace wrap, Short arm splint Ortho Device/Splint Interventions: Application   [name] 12/10/2016, 5:38 PM

## 2016-12-14 ENCOUNTER — Ambulatory Visit (INDEPENDENT_AMBULATORY_CARE_PROVIDER_SITE_OTHER): Payer: Commercial Managed Care - HMO | Admitting: Orthopaedic Surgery

## 2016-12-15 ENCOUNTER — Telehealth (INDEPENDENT_AMBULATORY_CARE_PROVIDER_SITE_OTHER): Payer: Self-pay | Admitting: Orthopaedic Surgery

## 2016-12-15 NOTE — Telephone Encounter (Signed)
This is Dr. Phoebe Sharps patient but he had a surgery scheduled for today at 9 and wasn't able to come because of transportation issues. He was wanting to know if he could get some oxycodone this afternoon. CB # [phone]

## 2016-12-18 ENCOUNTER — Telehealth (INDEPENDENT_AMBULATORY_CARE_PROVIDER_SITE_OTHER): Payer: Self-pay

## 2016-12-18 MED ORDER — OXYCODONE-ACETAMINOPHEN 5-325 MG PO TABS: 1.0000 | ORAL_TABLET | Freq: Four times a day (QID) | ORAL | 0 refills | Status: DC | PRN

## 2016-12-18 NOTE — Progress Notes (Signed)
Chart reviewed by Dr Glennon Mac, due to pt's platelet count being low at 38, and PT/INR being elevated, pt will be better served being done at main OR.

## 2016-12-18 NOTE — Telephone Encounter (Signed)
Yes #30

## 2016-12-18 NOTE — Telephone Encounter (Signed)
30

## 2016-12-18 NOTE — Telephone Encounter (Signed)
Rx ready for pick up, called pt pt aware.

## 2016-12-18 NOTE — Telephone Encounter (Signed)
Requesting refill of Oxycodone to get through until surgery on Wednesday.  Please call.  [phone]

## 2016-12-20 ENCOUNTER — Encounter (HOSPITAL_COMMUNITY): Payer: Self-pay | Admitting: *Deleted

## 2016-12-20 ENCOUNTER — Other Ambulatory Visit (INDEPENDENT_AMBULATORY_CARE_PROVIDER_SITE_OTHER): Payer: Self-pay | Admitting: Orthopaedic Surgery

## 2016-12-20 DIAGNOSIS — S52501A Unspecified fracture of the lower end of right radius, initial encounter for closed fracture: Secondary | ICD-10-CM

## 2016-12-20 NOTE — Progress Notes (Addendum)
Anesthesia Chart Review:  Pt is 51 year old male scheduled for ORIF R distal radius fracture on 12/21/2016 with Frankey Shown, MD  - PCP is Cathlean Cower, MD  PMH includes: bipolar affective disorder, seizures, alcohol abuse (reportedly none since 2005), encephalopathy, chronic liver cirrhosis, liver cancer, thrombocytopenia, esophageal varices, Wilson's disease. Current smoker. BMI 23. S/p R TKA 07/15/15. S/p R leg hardware removal 01/22/15. S/p ORIF R tibial plateau, tibia and fibula fractures 07/25/13.   Medications include: lasix, lactulose, keppra, potassium, propranolol, spironolactone, zinc  Labs will be obtained DOS.  - Most recent platelet result was 38 on 10/30/16; this is consistent with other prior results.  - PT was 20.5, INR 1.74 on 10/30/16. Prior results indicate PT is usually high.   EKG 10/29/16: Sinus rhythm.   Liver disease was followed by Dr. Rayvon Char (care everywhere), last office visit 09/29/15. Note by PCP Dr. Jenny Reichmann dated 11/12/16 states "pt to f/u with [facility] liver GI as planned. Pt does have an office visit scheduled with Dr. Drue Novel on 12/25/16 (noted in care everywhere).   I do not see that Dr. Drue Novel weighed in on this upcoming surgery, but she did clear him for TKA back in 2016.  She noted at that time: "He is at increased risk of bleeding complications with platelets 40-50,000 and INR of 1.47. Likely will need platelets +/- FFP. Would avoid large amounts of normal saline if possible in favor of more oncotic volume expander-(blood product, FFP, albumin)"  If labs acceptable, I anticipate pt can proceed as scheduled.  I defer decision to transfuse platelets/give FFP to anesthesiology and Dr. Erlinda Hong.   Willeen Cass, FNP-BC [facility]/Anesthesiology Phone: [phone] 12/20/2016 2:54 PM

## 2016-12-20 NOTE — Telephone Encounter (Signed)
Patient called back today and apologized for overreacting and being not so nice on the phone.  He explained he was under stress, hurting and taking pain medication.  I stated I appreciated him saying that and I understood.  He decided he wants to reschedule his surgery for tomorrow at [facility].  I told him I would reschedule and call him back with details.  I advised Dr. Erlinda Hong and he agreed.

## 2016-12-20 NOTE — Progress Notes (Signed)
Pt denies SOB, chest pain, and being under the care of a cardiologist. Pt denies having a cardiac cath and echo but stated that a stress test was performed > 8 years ago. Pt denies having a chest x ray. Pt made aware to stop taking  Aspirin, vitamins, fish oil, milk thistle and herbal medications. Do not take any NSAIDs ie: Ibuprofen, Advil, Naproxen, BC and Goody Powder or any medication containing Aspirin.Pt verbalized understanding of all pre-op instructions. Anesthesia asked to review pt history.

## 2016-12-20 NOTE — Telephone Encounter (Incomplete)
Spoke with patient yesterday regarding surgery being moved from [facility] to [facility] OR due to anesthesia decision because of patient's health history.  Due to OR schedule, surgery would have to be Thurs., 04/19 instead of Wed., 04/18.  Patient was upset because of transportation issues and transportation had already been arranged by his sister.  He wanted to sign a waiver and still have it done at [facility].  I advised that was not an option.  He decided to cancel surgery and stated he would have it done in [place].

## 2016-12-21 ENCOUNTER — Ambulatory Visit (HOSPITAL_COMMUNITY): Payer: Medicare HMO | Admitting: Emergency Medicine

## 2016-12-21 ENCOUNTER — Encounter (HOSPITAL_BASED_OUTPATIENT_CLINIC_OR_DEPARTMENT_OTHER): Admission: RE | Payer: Self-pay | Source: Ambulatory Visit

## 2016-12-21 ENCOUNTER — Encounter (HOSPITAL_COMMUNITY)
Admission: RE | Disposition: A | Discharge status: Home / Self Care | Payer: Self-pay | Source: Ambulatory Visit | Attending: Orthopaedic Surgery

## 2016-12-21 ENCOUNTER — Ambulatory Visit (HOSPITAL_BASED_OUTPATIENT_CLINIC_OR_DEPARTMENT_OTHER): Admission: RE | Admit: 2016-12-21 | Payer: Medicare HMO | Source: Ambulatory Visit | Admitting: Orthopaedic Surgery

## 2016-12-21 ENCOUNTER — Encounter (HOSPITAL_COMMUNITY): Payer: Self-pay | Admitting: Certified Registered"

## 2016-12-21 ENCOUNTER — Ambulatory Visit (HOSPITAL_COMMUNITY)
Admission: RE | Admit: 2016-12-21 | Discharge: 2016-12-21 | Disposition: A | Discharge status: Home / Self Care | Payer: Medicare HMO | Source: Ambulatory Visit | Attending: Orthopaedic Surgery | Admitting: Orthopaedic Surgery

## 2016-12-21 DIAGNOSIS — F17210 Nicotine dependence, cigarettes, uncomplicated: Secondary | ICD-10-CM | POA: Diagnosis not present

## 2016-12-21 DIAGNOSIS — F319 Bipolar disorder, unspecified: Secondary | ICD-10-CM | POA: Diagnosis not present

## 2016-12-21 DIAGNOSIS — Z96651 Presence of right artificial knee joint: Secondary | ICD-10-CM | POA: Diagnosis not present

## 2016-12-21 DIAGNOSIS — X58XXXA Exposure to other specified factors, initial encounter: Secondary | ICD-10-CM | POA: Insufficient documentation

## 2016-12-21 DIAGNOSIS — Z885 Allergy status to narcotic agent status: Secondary | ICD-10-CM | POA: Diagnosis not present

## 2016-12-21 DIAGNOSIS — R197 Diarrhea, unspecified: Secondary | ICD-10-CM | POA: Diagnosis not present

## 2016-12-21 DIAGNOSIS — R079 Chest pain, unspecified: Secondary | ICD-10-CM | POA: Diagnosis not present

## 2016-12-21 DIAGNOSIS — Z8505 Personal history of malignant neoplasm of liver: Secondary | ICD-10-CM | POA: Diagnosis not present

## 2016-12-21 DIAGNOSIS — S52501A Unspecified fracture of the lower end of right radius, initial encounter for closed fracture: Secondary | ICD-10-CM

## 2016-12-21 DIAGNOSIS — Z79899 Other long term (current) drug therapy: Secondary | ICD-10-CM | POA: Diagnosis not present

## 2016-12-21 DIAGNOSIS — Z8249 Family history of ischemic heart disease and other diseases of the circulatory system: Secondary | ICD-10-CM | POA: Diagnosis not present

## 2016-12-21 DIAGNOSIS — Z833 Family history of diabetes mellitus: Secondary | ICD-10-CM | POA: Diagnosis not present

## 2016-12-21 DIAGNOSIS — D696 Thrombocytopenia, unspecified: Secondary | ICD-10-CM | POA: Diagnosis not present

## 2016-12-21 HISTORY — PX: OPEN REDUCTION INTERNAL FIXATION (ORIF) DISTAL RADIAL FRACTURE: SHX5989

## 2016-12-21 HISTORY — DX: Fracture of unspecified carpal bone, unspecified wrist, initial encounter for closed fracture: S62.109A

## 2016-12-21 LAB — CBC
HCT: 35.7 % — ABNORMAL LOW (ref 39.0–52.0)
Hemoglobin: 12 g/dL — ABNORMAL LOW (ref 13.0–17.0)
MCH: 33.4 pg (ref 26.0–34.0)
MCHC: 33.6 g/dL (ref 30.0–36.0)
MCV: 99.4 fL (ref 78.0–100.0)
Platelets: 60 10*3/uL — ABNORMAL LOW (ref 150–400)
RBC: 3.59 MIL/uL — ABNORMAL LOW (ref 4.22–5.81)
RDW: 16.8 % — ABNORMAL HIGH (ref 11.5–15.5)
WBC: 4.7 10*3/uL (ref 4.0–10.5)

## 2016-12-21 LAB — PROTIME-INR
INR: 1.31
Prothrombin Time: 16.4 seconds — ABNORMAL HIGH (ref 11.4–15.2)

## 2016-12-21 LAB — COMPREHENSIVE METABOLIC PANEL
ALT: 33 U/L (ref 17–63)
AST: 51 U/L — ABNORMAL HIGH (ref 15–41)
Albumin: 2.8 g/dL — ABNORMAL LOW (ref 3.5–5.0)
Alkaline Phosphatase: 163 U/L — ABNORMAL HIGH (ref 38–126)
Anion gap: 7 (ref 5–15)
BUN: 10 mg/dL (ref 6–20)
CO2: 21 mmol/L — ABNORMAL LOW (ref 22–32)
Calcium: 8.6 mg/dL — ABNORMAL LOW (ref 8.9–10.3)
Chloride: 109 mmol/L (ref 101–111)
Creatinine, Ser: 0.67 mg/dL (ref 0.61–1.24)
GFR calc Af Amer: >60 mL/min (ref >60)
GFR calc non Af Amer: >60 mL/min (ref >60)
Glucose, Bld: 64 mg/dL — ABNORMAL LOW (ref 65–99)
Potassium: 4.1 mmol/L (ref 3.5–5.1)
Sodium: 137 mmol/L (ref 135–145)
Total Bilirubin: 2.4 mg/dL — ABNORMAL HIGH (ref 0.3–1.2)
Total Protein: 5.6 g/dL — ABNORMAL LOW (ref 6.5–8.1)

## 2016-12-21 LAB — SURGICAL PCR SCREEN
MRSA, PCR: NEGATIVE
Staphylococcus aureus: NEGATIVE

## 2016-12-21 LAB — NO BLOOD PRODUCTS

## 2016-12-21 SURGERY — OPEN REDUCTION INTERNAL FIXATION (ORIF) DISTAL RADIUS FRACTURE
Anesthesia: General | Site: Wrist | Laterality: Right

## 2016-12-21 SURGERY — OPEN REDUCTION INTERNAL FIXATION (ORIF) DISTAL RADIUS FRACTURE
Anesthesia: Monitor Anesthesia Care | Site: Wrist | Laterality: Right

## 2016-12-21 MED ORDER — MEPERIDINE HCL 25 MG/ML IJ SOLN: 6.2500 mg | INTRAMUSCULAR | Status: DC | PRN

## 2016-12-21 MED ORDER — SENNOSIDES-DOCUSATE SODIUM 8.6-50 MG PO TABS: 1.0000 | ORAL_TABLET | Freq: Every evening | ORAL | 1 refills | Status: DC | PRN

## 2016-12-21 MED ORDER — PROPOFOL 500 MG/50ML IV EMUL
INTRAVENOUS | Status: DC | PRN
Start: 2016-12-21 — End: 2016-12-21
  Administered 2016-12-21: 100 ug/kg/min via INTRAVENOUS

## 2016-12-21 MED ORDER — MIDAZOLAM HCL 2 MG/2ML IJ SOLN
INTRAMUSCULAR | Status: AC
  Filled 2016-12-21: qty 2

## 2016-12-21 MED ORDER — LACTATED RINGERS IV SOLN: INTRAVENOUS | Status: DC

## 2016-12-21 MED ORDER — MIDAZOLAM HCL 2 MG/2ML IJ SOLN
2.0000 mg | Freq: Once | INTRAMUSCULAR | Status: AC
  Administered 2016-12-21: 2 mg via INTRAVENOUS

## 2016-12-21 MED ORDER — FENTANYL CITRATE (PF) 100 MCG/2ML IJ SOLN
50.0000 ug | Freq: Once | INTRAMUSCULAR | Status: AC
  Administered 2016-12-21: 50 ug via INTRAVENOUS

## 2016-12-21 MED ORDER — HYDROMORPHONE HCL 1 MG/ML IJ SOLN: 0.2500 mg | INTRAMUSCULAR | Status: DC | PRN

## 2016-12-21 MED ORDER — PROMETHAZINE HCL 25 MG PO TABS
25.0000 mg | ORAL_TABLET | Freq: Four times a day (QID) | ORAL | 1 refills | Status: DC | PRN
Start: 2016-12-21 — End: 2016-12-28

## 2016-12-21 MED ORDER — HYDROMORPHONE HCL 2 MG PO TABS: 2.0000 mg | ORAL_TABLET | ORAL | 0 refills | Status: DC | PRN

## 2016-12-21 MED ORDER — LIDOCAINE 2% (20 MG/ML) 5 ML SYRINGE
INTRAMUSCULAR | Status: DC | PRN
  Administered 2016-12-21: 80 mg via INTRAVENOUS

## 2016-12-21 MED ORDER — CEFAZOLIN SODIUM-DEXTROSE 2-4 GM/100ML-% IV SOLN
2.0000 g | INTRAVENOUS | Status: AC
  Administered 2016-12-21: 2 g via INTRAVENOUS
  Filled 2016-12-21: qty 100

## 2016-12-21 MED ORDER — FENTANYL CITRATE (PF) 100 MCG/2ML IJ SOLN
INTRAMUSCULAR | Status: AC
  Filled 2016-12-21: qty 2

## 2016-12-21 MED ORDER — 0.9 % SODIUM CHLORIDE (POUR BTL) OPTIME
TOPICAL | Status: DC | PRN
  Administered 2016-12-21 (×2): 1000 mL

## 2016-12-21 MED ORDER — ONDANSETRON HCL 4 MG/2ML IJ SOLN: 4.0000 mg | Freq: Once | INTRAMUSCULAR | Status: DC | PRN

## 2016-12-21 MED ORDER — LACTATED RINGERS IV SOLN
INTRAVENOUS | Status: DC
  Administered 2016-12-21: 50 mL/h via INTRAVENOUS

## 2016-12-21 MED ORDER — ONDANSETRON HCL 4 MG PO TABS: 4.0000 mg | ORAL_TABLET | Freq: Three times a day (TID) | ORAL | 0 refills | Status: DC | PRN

## 2016-12-21 SURGICAL SUPPLY — 60 items
BANDAGE ACE 3X5.8 VEL STRL LF (GAUZE/BANDAGES/DRESSINGS) ×2 IMPLANT
BANDAGE ACE 4X5 VEL STRL LF (GAUZE/BANDAGES/DRESSINGS) ×2 IMPLANT
BIT DRILL 2.2 SS TIBIAL (BIT) ×2 IMPLANT
BLADE CLIPPER SURG (BLADE) IMPLANT
BLADE SURG 10 STRL SS (BLADE) IMPLANT
BLADE SURG 15 STRL LF DISP TIS (BLADE) IMPLANT
BLADE SURG 15 STRL SS (BLADE)
BNDG ESMARK 4X9 LF (GAUZE/BANDAGES/DRESSINGS) ×2 IMPLANT
CORDS BIPOLAR (ELECTRODE) ×2 IMPLANT
COVER SURGICAL LIGHT HANDLE (MISCELLANEOUS) ×2 IMPLANT
CUFF TOURNIQUET SINGLE 18IN (TOURNIQUET CUFF) ×2 IMPLANT
CUFF TOURNIQUET SINGLE 24IN (TOURNIQUET CUFF) IMPLANT
DRAPE C-ARM 42X72 X-RAY (DRAPES) ×2 IMPLANT
DRAPE U-SHAPE 47X51 STRL (DRAPES) ×2 IMPLANT
ELECT CAUTERY BLADE 6.4 (BLADE) ×2 IMPLANT
GAUZE SPONGE 4X4 12PLY STRL (GAUZE/BANDAGES/DRESSINGS) IMPLANT
GAUZE SPONGE 4X4 12PLY STRL LF (GAUZE/BANDAGES/DRESSINGS) ×2 IMPLANT
GAUZE XEROFORM 1X8 LF (GAUZE/BANDAGES/DRESSINGS) ×2 IMPLANT
GLOVE BIO SURGEON STRL SZ7 (GLOVE) ×2 IMPLANT
GLOVE SKINSENSE NS SZ7.5 (GLOVE) ×1
GLOVE SKINSENSE STRL SZ7.5 (GLOVE) ×1 IMPLANT
GLOVE SURG SYN 7.5  E (GLOVE) ×1
GLOVE SURG SYN 7.5 E (GLOVE) ×1 IMPLANT
GLOVE SURG SYN 8.0 (GLOVE) ×2 IMPLANT
GOWN STRL REIN XL XLG (GOWN DISPOSABLE) ×2 IMPLANT
K-WIRE 1.6 (WIRE) ×3
K-WIRE FX5X1.6XNS BN SS (WIRE) ×3
KIT BASIN OR (CUSTOM PROCEDURE TRAY) ×2 IMPLANT
KIT ROOM TURNOVER OR (KITS) ×2 IMPLANT
KWIRE FX5X1.6XNS BN SS (WIRE) ×3 IMPLANT
NEEDLE 22X1 1/2 (OR ONLY) (NEEDLE) IMPLANT
NS IRRIG 1000ML POUR BTL (IV SOLUTION) ×2 IMPLANT
PACK ORTHO EXTREMITY (CUSTOM PROCEDURE TRAY) ×2 IMPLANT
PAD ARMBOARD 7.5X6 YLW CONV (MISCELLANEOUS) ×4 IMPLANT
PAD CAST 4YDX4 CTTN HI CHSV (CAST SUPPLIES) IMPLANT
PADDING CAST ABS 4INX4YD NS (CAST SUPPLIES) ×1
PADDING CAST ABS COTTON 4X4 ST (CAST SUPPLIES) ×1 IMPLANT
PADDING CAST COTTON 4X4 STRL (CAST SUPPLIES)
PLATE DVR CROSSLOCK STD RT (Plate) ×2 IMPLANT
SCREW LOCK 16X2.7X 3 LD TPR (Screw) ×3 IMPLANT
SCREW LOCK 18X2.7X 3 LD TPR (Screw) ×1 IMPLANT
SCREW LOCK 20X2.7X 3 LD TPR (Screw) ×6 IMPLANT
SCREW LOCKING 2.7X16 (Screw) ×3 IMPLANT
SCREW LOCKING 2.7X18 (Screw) ×1 IMPLANT
SCREW LOCKING 2.7X20MM (Screw) ×6 IMPLANT
SCREW LP NL 2.7X22MM (Screw) ×4 IMPLANT
SPLINT FIBERGLASS 4X30 (CAST SUPPLIES) ×2 IMPLANT
SPONGE LAP 4X18 X RAY DECT (DISPOSABLE) IMPLANT
SUT ETHILON 4 0 CL P 3 (SUTURE) ×2 IMPLANT
SUT ETHILON 4 0 PS 2 18 (SUTURE) ×4 IMPLANT
SUT MNCRL AB 4-0 PS2 18 (SUTURE) IMPLANT
SUT PROLENE 3 0 PS 2 (SUTURE) IMPLANT
SUT VIC AB 3-0 FS2 27 (SUTURE) IMPLANT
SUT VIC AB 3-0 SH 27 (SUTURE) ×1
SUT VIC AB 3-0 SH 27X BRD (SUTURE) ×1 IMPLANT
SYR CONTROL 10ML LL (SYRINGE) IMPLANT
TOWEL OR 17X24 6PK STRL BLUE (TOWEL DISPOSABLE) ×2 IMPLANT
TOWEL OR 17X26 10 PK STRL BLUE (TOWEL DISPOSABLE) ×2 IMPLANT
TUBE CONNECTING 12X1/4 (SUCTIONS) ×2 IMPLANT
UNDERPAD 30X30 (UNDERPADS AND DIAPERS) ×4 IMPLANT

## 2016-12-21 NOTE — Discharge Instructions (Signed)
Postoperative instructions: ° °Weightbearing instructions: non weight bearing ° °Dressing instructions: Keep your dressing and/or splint clean and dry at all times.  It will be removed at your first post-operative appointment.  Your stitches and/or staples will be removed at this visit. ° °Incision instructions:  Do not soak your incision for 3 weeks after surgery.  If the incision gets wet, pat dry and do not scrub the incision. ° °Pain control:  You have been given a prescription to be taken as directed for post-operative pain control.  In addition, elevate the operative extremity above the heart at all times to prevent swelling and throbbing pain. ° °Take over-the-counter Colace, 100mg by mouth twice a day while taking narcotic pain medications to help prevent constipation. ° °Follow up appointments: °1) 10-14 days for suture removal and wound check. °2) Dr. Winnie Umali as scheduled. ° ° ------------------------------------------------------------------------------------------------------------- ° °After Surgery Pain Control: ° °After your surgery, post-surgical discomfort or pain is likely. This discomfort can last several days to a few weeks. At certain times of the day your discomfort may be more intense.  °Did you receive a nerve block?  °A nerve block can provide pain relief for one hour to two days after your surgery. As long as the nerve block is working, you will experience little or no sensation in the area the surgeon operated on.  °As the nerve block wears off, you will begin to experience pain or discomfort. It is very important that you begin taking your prescribed pain medication before the nerve block fully wears off. Treating your pain at the first sign of the block wearing off will ensure your pain is better controlled and more tolerable when full-sensation returns. Do not wait until the pain is intolerable, as the medicine will be less effective. It is better to treat pain in advance than to try and  catch up.  °General Anesthesia:  °If you did not receive a nerve block during your surgery, you will need to start taking your pain medication shortly after your surgery and should continue to do so as prescribed by your surgeon.  °Pain Medication:  °Most commonly we prescribe Vicodin and Percocet for post-operative pain. Both of these medications contain a combination of acetaminophen (Tylenol®) and a narcotic to help control pain.  °· It takes between 30 and 45 minutes before pain medication starts to work. It is important to take your medication before your pain level gets too intense.  °· Nausea is a common side effect of many pain medications. You will want to eat something before taking your pain medicine to help prevent nausea.  °· If you are taking a prescription pain medication that contains acetaminophen, we recommend that you do not take additional over the counter acetaminophen (Tylenol®).  °Other pain relieving options:  °· Using a cold pack to ice the affected area a few times a day (15 to 20 minutes at a time) can help to relieve pain, reduce swelling and bruising.  °· Elevation of the affected area can also help to reduce pain and swelling. ° ° ° °

## 2016-12-21 NOTE — Transfer of Care (Signed)
Immediate Anesthesia Transfer of Care Note  Patient: [name]  Procedure(s) Performed: Procedure(s): OPEN REDUCTION INTERNAL FIXATION (ORIF) RIGHT DISTAL RADIUS FRACTURE (Right)  Patient Location: PACU  Anesthesia Type:MAC combined with regional for post-op pain  Level of Consciousness: sedated and responds to stimulation  Airway & Oxygen Therapy: Patient Spontanous Breathing and Patient connected to nasal cannula oxygen  Post-op Assessment: Report given to RN and Post -op Vital signs reviewed and stable  Post vital signs: Reviewed and stable  Last Vitals:  Vitals:   12/21/16 1500 12/21/16 1727  BP: (!) 117/50   Pulse:    Resp:    Temp:  (P) 36.3 C    Last Pain:  Vitals:   12/21/16 1449  TempSrc: Oral  PainSc:       Patients Stated Pain Goal: 3 (59/16/38 4665)  Complications: No apparent anesthesia complications

## 2016-12-21 NOTE — Progress Notes (Signed)
Report to C. Ernestine Conrad RN as primary caregiver. Waiting on ride  home

## 2016-12-21 NOTE — Anesthesia Postprocedure Evaluation (Signed)
Anesthesia Post Note  Patient: [name]  Procedure(s) Performed: Procedure(s) (LRB): OPEN REDUCTION INTERNAL FIXATION (ORIF) RIGHT DISTAL RADIUS FRACTURE (Right)  Patient location during evaluation: PACU Anesthesia Type: Regional Level of consciousness: awake and alert and patient cooperative Pain management: pain level controlled Vital Signs Assessment: post-procedure vital signs reviewed and stable Respiratory status: spontaneous breathing and respiratory function stable Cardiovascular status: stable Anesthetic complications: no       Last Vitals:  Vitals:   12/21/16 1727 12/21/16 1730  BP:    Pulse:  67  Resp:  12  Temp: 36.3 C     Last Pain:  Vitals:   12/21/16 1449  TempSrc: Oral  PainSc:                  [place] Dunnigan DAVID

## 2016-12-21 NOTE — Anesthesia Preprocedure Evaluation (Addendum)
Anesthesia Evaluation  Patient identified by MRN, date of birth, ID band Patient awake    Reviewed: Allergy & Precautions, NPO status , Patient's Chart, lab work & pertinent test results  History of Anesthesia Complications (+) PONV  Airway Mallampati: I  TM Distance: >3 FB Neck ROM: Full    Dental   Pulmonary Current Smoker,    Pulmonary exam normal        Cardiovascular Normal cardiovascular exam     Neuro/Psych    GI/Hepatic Wilsons Disease on Zinc   Endo/Other    Renal/GU      Musculoskeletal   Abdominal   Peds  Hematology   Anesthesia Other Findings   Reproductive/Obstetrics                            Anesthesia Physical Anesthesia Plan  ASA: III  Anesthesia Plan: Regional and MAC   Post-op Pain Management:    Induction: Intravenous  Airway Management Planned: Simple Face Mask  Additional Equipment:   Intra-op Plan:   Post-operative Plan:   Informed Consent: I have reviewed the patients History and Physical, chart, labs and discussed the procedure including the risks, benefits and alternatives for the proposed anesthesia with the patient or authorized representative who has indicated his/her understanding and acceptance.     Plan Discussed with: CRNA and Surgeon  Anesthesia Plan Comments:         Anesthesia Quick Evaluation

## 2016-12-21 NOTE — Op Note (Signed)
   DATE OF SURGERY: 12/21/2016  PREOPERATIVE DIAGNOSIS: right distal radius fracture  POSTOPERATIVE DIAGNOSIS: same  PROCEDURE: Open reduction and internal fixation, Right  distal radius. CPT [phone] 3 or more fragments  IMPLANT:  Implant Name Type Inv. Item Serial No. Manufacturer Lot No. LRB No. Used  LOG [id number] -  BIOMET DVR HAND INNOVATIONS SET - 1         LOG [id number] -  BIOMET/DEPUY ACE LOCKING SMALL FRAGMENT SET - 1         PLATE DVR CROSSLOCK STD RT - SN/A Plate PLATE DVR CROSSLOCK STD RT N/A ZIMMER CAROLINAS N/A Right 1  SCREW LOCKING 2.7X16 - SN/A Screw SCREW LOCKING 2.7X16 N/A ZIMMER CAROLINAS N/A Right 2  SCREW LP NL 2.7X22MM - SN/A Screw SCREW LP NL 2.7X22MM N/A ZIMMER CAROLINAS N/A Right 1  SCREW LOCKING 2.7X20MM - SN/A Screw SCREW LOCKING 2.7X20MM N/A ZIMMER CAROLINAS N/A Right 6  SCREW LP NL 2.7X22MM - SN/A Screw SCREW LP NL 2.7X22MM N/A ZIMMER CAROLINAS N/A Right 1  SCREW LOCKING 2.7X18 - SN/A Screw SCREW LOCKING 2.7X18 N/A ZIMMER CAROLINAS N/A Right 1  SCREW LOCKING 2.7X16 - SN/A Screw SCREW LOCKING 2.7X16 N/A ZIMMER CAROLINAS N/A Right 1     SURGEON: Surgeon(s) and Role:    * Naiping Ephriam Jenkins, MD - Primary  ANESTHESIA: General  TOURNIQUET TIME: less than 60 mins  BLOOD LOSS: Minimal.  COMPLICATIONS: None.  PATHOLOGY: None.  TIME OUT: Performed before the start of procedure.  INDICATIONS: The patient is a 51 y.o. male who presented with a displaced distal radius fracture indicated for osteosynthesis.  DESCRIPTION OF PROCEDURE:  The patient was identified in the preoperative holding area.  The operative site was marked by the surgeon and confirmed by the patient.  He was brought back to the operating room.  Anesthesia was induced by the anesthesia team.  A well padded nonsterile tourniquet was placed. The operative extremity was prepped and draped in standard sterile fashion.  A timeout was performed.  Preoperative antibiotics were given. The extremity was  exsanguinated, tourniquet inflated to 250 mmHg.  A FCR approach was used.  Dissection through the sheath of FCR was carried out and the FCR tendon was retracted. The floor of FCR sheath was released. Flexor tendons and median nerve were retracted ulnarly and protected. Pronator quadratus was released from distal radius. Fracture lines were visualized. Fracture was reduced under fluoroscopy. A volar locking plate was applied to the volar surface of the distal radius. Distal locking screws and nonlocking shaft screws were inserted. Fluoroscopy was used to show adequate reduction. The tourniquet was deflated. Hemostasis was achieved. The wound was irrigated. Incision closed in layers. Sterile dressing applied. Wrist immobilized in a removable brace. The patient was transferred to the recovery room in stable condition after all counts were correct.  POSTOPERATIVE PLAN: The patient will remain in the brace until instructed. Sutures will be removed in two weeks. About four weeks after surgery, will start wrist range of motion exercises, but in the meantime before then she will start aggressive finger range of motion exercises without resistance.  The "six pack of hand exercises" handout was given to the patient.

## 2016-12-21 NOTE — H&P (Signed)
PREOPERATIVE H&P  Chief Complaint: right distal radius fracture  HPI: [name] is a 51 y.o. male who presents for surgical treatment of right distal radius fracture.  He denies any changes in medical history.  Past Medical History:  Diagnosis Date  . Abuse, drug or alcohol   . Bipolar affective disorder ([place])    [place]  . Cancer [facility])    liver cancer treated with micrablation  . Chronic liver disease and cirrhosis   . Depression   . DJD (degenerative joint disease)    right knee  . Dyslipidemia   . Encephalopathy   . Esophageal varices ([place])   . Hernia   . History of alcohol abuse   . PONV (postoperative nausea and vomiting)   . Seizures ([employer])    Dr Jannifer Franklin  . Thrombocytopenia ([place])   . Wilson disease   . Wrist fracture    right   Past Surgical History:  Procedure Laterality Date  . ESOPHAGOGASTRODUODENOSCOPY    . HARDWARE REMOVAL Right 01/22/2015   Procedure: HARDWARE REMOVAL RIGHT LEG;  Surgeon: Leandrew Koyanagi, MD;  Location: [place];  Service: Orthopedics;  Laterality: Right;  . left inguinal hernia  june 2012   [place]  . liver cancer  02/2015   ablation  . microablation of liver    . OPEN REDUCTION INTERNAL FIXATION (ORIF) TIBIA/FIBULA FRACTURE Right 07/25/2013   Procedure: OPEN REDUCTION INTERNAL FIXATION (ORIF) RIGHT TIBIAL PLATEAU, TIBIAL SHAFT AND FIBULA FRACTURES, POSSIBLE FASCIOTOMIES;  Surgeon: Marianna Payment, MD;  Location: [place];  Service: Orthopedics;  Laterality: Right;  . TOTAL KNEE ARTHROPLASTY Right 07/15/2015   Procedure: RIGHT TOTAL KNEE ARTHROPLASTY;  Surgeon: Leandrew Koyanagi, MD;  Location: [place];  Service: Orthopedics;  Laterality: Right;  [name] VASCULAR SURGERY     Social History   Social History  . Marital status: Divorced    Spouse name: N/A  . Number of children: 2  . Years of education: HS   Occupational History  . disabled Disability   Social History Main Topics  . Smoking status: Current Some  Day Smoker    Packs/day: 0.25    Types: Cigarettes  . Smokeless tobacco: Never Used  . Alcohol use No     Comment: 3-4 bottles a week, quit drinking 2005  . Drug use: No     Comment: last use 2005  . Sexual activity: Yes   Other Topics Concern  . None   Social History Narrative   Patient is right handed.   Patient drinks some caffeine daily.   Family History  Problem Relation Age of Onset  . Heart disease Other   . Diabetes Father   . Dementia Mother   . Glaucoma Brother    Allergies  Allergen Reactions  . Codeine Nausea And Vomiting    Passed out, was also drinking at the time   Prior to Admission medications   Medication Sig Start Date End Date Taking? Authorizing Provider  acetaminophen (TYLENOL) 500 MG tablet Take 500 mg by mouth every 6 (six) hours as needed (pain). Take 3 tabs as needed for pain.    Historical Provider, MD  escitalopram (LEXAPRO) 10 MG tablet Take 1 tablet (10 mg total) by mouth daily. 11/07/16 02/05/17  Biagio Borg, MD  furosemide (LASIX) 20 MG tablet Take three tablets by mouth twice daily at [place] and 6PM. 10/27/16   Historical Provider, MD  lactulose ([place]) 10 G packet Take 10  g by mouth daily as needed (toxin buildup).  07/03/11   Historical Provider, MD  levETIRAcetam (KEPPRA) 750 MG tablet Take 2 tablets (1,500 mg total) by mouth 2 (two) times daily. 10/30/16   Geradine Girt, DO  MILK THISTLE PO Take 1 tablet by mouth 2 (two) times daily.    Historical Provider, MD  oxyCODONE-acetaminophen (PERCOCET/ROXICET) 5-325 MG tablet Take 1-2 tablets by mouth every 6 (six) hours as needed for severe pain. 12/18/16   Tasheba Henson Ephriam Jenkins, MD  potassium chloride (K-DUR) 10 MEQ tablet TAKE 1 TABLET BY MOUTH TWICE DAILY 06/28/15   Biagio Borg, MD  propranolol (INDERAL) 10 MG tablet Take 10 mg by mouth 2 (two) times daily.     Historical Provider, MD  spironolactone (ALDACTONE) 100 MG tablet Take 150 mg by mouth. 10/27/16   Historical Provider, MD  zinc gluconate 50 MG  tablet Take 50 mg by mouth 3 (three) times daily.     Historical Provider, MD     Positive ROS: All other systems have been reviewed and were otherwise negative with the exception of those mentioned in the HPI and as above.  Physical Exam: General: Alert, no acute distress Cardiovascular: No pedal edema Respiratory: No cyanosis, no use of accessory musculature GI: abdomen soft Skin: No lesions in the area of chief complaint Neurologic: Sensation intact distally Psychiatric: Patient is competent for consent with normal mood and affect Lymphatic: no lymphedema  MUSCULOSKELETAL: exam stable  Assessment: right distal radius fracture  Plan: Plan for Procedure(s): OPEN REDUCTION INTERNAL FIXATION (ORIF) RIGHT DISTAL RADIUS FRACTURE  The risks benefits and alternatives were discussed with the patient including but not limited to the risks of nonoperative treatment, versus surgical intervention including infection, bleeding, nerve injury,  blood clots, cardiopulmonary complications, morbidity, mortality, among others, and they were willing to proceed.   Eduard Roux, MD   12/21/2016 8:20 AM

## 2016-12-21 NOTE — Anesthesia Procedure Notes (Signed)
Anesthesia Regional Block: Supraclavicular block   Pre-Anesthetic Checklist: ,, timeout performed, Correct Patient, Correct Site, Correct Laterality, Correct Procedure, Correct Position, site marked, Risks and benefits discussed,  Surgical consent,  Pre-op evaluation,  At surgeon's request and post-op pain management  Laterality: Right  Prep: chloraprep       Needles:   Needle Type: Echogenic Stimulator Needle     Needle Length: 9cm      Additional Needles:   Procedures: ultrasound guided, nerve stimulator,,,,,,   Nerve Stimulator or Paresthesia:  Response: 0.4 mA,   Additional Responses:   Narrative:  Start time: 12/21/2016 3:20 PM End time: 12/21/2016 3:30 PM Injection made incrementally with aspirations every 5 mL.  Performed by: Personally  Anesthesiologist: Lillia Abed  Additional Notes: Monitors applied. Patient sedated. Sterile prep and drape,hand hygiene and sterile gloves were used. Relevant anatomy identified.Needle position confirmed.Local anesthetic injected incrementally after negative aspiration. Local anesthetic spread visualized around nerve(s). Vascular puncture avoided. No complications. Image printed for medical record.The patient tolerated the procedure well.

## 2016-12-22 ENCOUNTER — Encounter (HOSPITAL_COMMUNITY): Payer: Self-pay | Admitting: Orthopaedic Surgery

## 2016-12-23 DIAGNOSIS — S52501A Unspecified fracture of the lower end of right radius, initial encounter for closed fracture: Secondary | ICD-10-CM

## 2016-12-27 ENCOUNTER — Other Ambulatory Visit: Payer: Self-pay | Admitting: Nurse Practitioner

## 2016-12-27 NOTE — Telephone Encounter (Signed)
Refill for Keppra refused. Patient was dismissed form practice on March 2018. Advised pharmacy to call PCP for refills.

## 2016-12-28 ENCOUNTER — Ambulatory Visit (INDEPENDENT_AMBULATORY_CARE_PROVIDER_SITE_OTHER): Payer: Medicare HMO | Admitting: Internal Medicine

## 2016-12-28 ENCOUNTER — Encounter: Payer: Self-pay | Admitting: Internal Medicine

## 2016-12-28 DIAGNOSIS — F3160 Bipolar disorder, current episode mixed, unspecified: Secondary | ICD-10-CM | POA: Diagnosis not present

## 2016-12-28 DIAGNOSIS — R7302 Impaired glucose tolerance (oral): Secondary | ICD-10-CM | POA: Diagnosis not present

## 2016-12-28 MED ORDER — FUROSEMIDE 20 MG PO TABS: ORAL_TABLET | ORAL | 5 refills | Status: DC

## 2016-12-28 MED ORDER — LEVETIRACETAM 1000 MG PO TABS: 1000.0000 mg | ORAL_TABLET | Freq: Two times a day (BID) | ORAL | 1 refills | Status: DC

## 2016-12-28 NOTE — Assessment & Plan Note (Signed)
With chronic liver dz, perpiheral edema - to cont lasix

## 2016-12-28 NOTE — Progress Notes (Signed)
Subjective:    Patient ID: [name], male    DOB: [birth year], 51 y.o.   MRN: [id number]  HPI here after recent hospn 4/19 with right radius fracture, now s/p ORIF.  Unfortunately he apparently tripped and fell on the same place on sidewalk on [address] where he fell 4 yrs ago and broke right wrist.  Missed appt with neurology during all this, needs keppra refill until can get back to Dr Jannifer Franklin.  Denies worsening depressive symptoms, suicidal ideation, or panic; has ongoing anxiety, but overall feels improved from last visit.  Has liver cancer recurren e per pt , with tx coming up per [facility]. Has f/u aptp with Dr Erlinda Hong. No new compalints. Pt denies chest pain, increased sob or doe, wheezing, orthopnea, PND, increased LE swelling, palpitations, dizziness or syncope.   Pt denies fever, wt loss, night sweats, loss of appetite, or other constitutional symptoms Past Medical History:  Diagnosis Date  . Abuse, drug or alcohol   . Bipolar affective disorder ([place])    [place]  . Cancer [facility])    liver cancer treated with micrablation  . Chronic liver disease and cirrhosis   . Depression   . DJD (degenerative joint disease)    right knee  . Dyslipidemia   . Encephalopathy   . Esophageal varices ([place])   . Hernia   . History of alcohol abuse   . PONV (postoperative nausea and vomiting)   . Seizures ([place])    Dr Jannifer Franklin  . Thrombocytopenia ([place])   . Wilson disease   . Wrist fracture    right   Past Surgical History:  Procedure Laterality Date  . ESOPHAGOGASTRODUODENOSCOPY    . HARDWARE REMOVAL Right 01/22/2015   Procedure: HARDWARE REMOVAL RIGHT LEG;  Surgeon: Leandrew Koyanagi, MD;  Location: [place];  Service: Orthopedics;  Laterality: Right;  . left inguinal hernia  june 2012   [place]  . liver cancer  02/2015   ablation  . microablation of liver    . OPEN REDUCTION INTERNAL FIXATION (ORIF) DISTAL RADIAL FRACTURE Right 12/21/2016   Procedure: OPEN REDUCTION  INTERNAL FIXATION (ORIF) RIGHT DISTAL RADIUS FRACTURE;  Surgeon: Leandrew Koyanagi, MD;  Location: [place];  Service: Orthopedics;  Laterality: Right;  . OPEN REDUCTION INTERNAL FIXATION (ORIF) TIBIA/FIBULA FRACTURE Right 07/25/2013   Procedure: OPEN REDUCTION INTERNAL FIXATION (ORIF) RIGHT TIBIAL PLATEAU, TIBIAL SHAFT AND FIBULA FRACTURES, POSSIBLE FASCIOTOMIES;  Surgeon: Marianna Payment, MD;  Location: [place];  Service: Orthopedics;  Laterality: Right;  . TOTAL KNEE ARTHROPLASTY Right 07/15/2015   Procedure: RIGHT TOTAL KNEE ARTHROPLASTY;  Surgeon: Leandrew Koyanagi, MD;  Location: [place];  Service: Orthopedics;  Laterality: Right;  [name] VASCULAR SURGERY      reports that he has been smoking Cigarettes.  He has been smoking about 0.25 packs per day. He has never used smokeless tobacco. He reports that he does not drink alcohol or use drugs. family history includes Dementia in his mother; Diabetes in his father; Glaucoma in his brother; Heart disease in his other. Allergies  Allergen Reactions  . Codeine Nausea And Vomiting    Passed out, was also drinking at the time   Current Outpatient Prescriptions on File Prior to Visit  Medication Sig Dispense Refill  . acetaminophen (TYLENOL) 500 MG tablet Take 500 mg by mouth every 6 (six) hours as needed (pain). Take 3 tabs as needed for pain.    [name] escitalopram (LEXAPRO) 10 MG  tablet Take 1 tablet (10 mg total) by mouth daily. 90 tablet 3  . HYDROmorphone (DILAUDID) 2 MG tablet Take 1 tablet (2 mg total) by mouth every 4 (four) hours as needed for severe pain. 60 tablet 0  . lactulose (CEPHULAC) 10 G packet Take 10 g by mouth daily as needed (toxin buildup).     [name] MILK THISTLE PO Take 1 tablet by mouth daily.     [name] oxyCODONE-acetaminophen (PERCOCET/ROXICET) 5-325 MG tablet Take 1-2 tablets by mouth every 6 (six) hours as needed for severe pain. 30 tablet 0  . potassium chloride (K-DUR) 10 MEQ tablet TAKE 1 TABLET BY MOUTH TWICE DAILY 60 tablet 11  . propranolol  (INDERAL) 10 MG tablet Take 10 mg by mouth 2 (two) times daily.     . ranitidine (ZANTAC) 75 MG tablet Take 75 mg by mouth daily as needed for heartburn.    . senna-docusate (SENOKOT S) 8.6-50 MG tablet Take 1 tablet by mouth at bedtime as needed. 30 tablet 1  . spironolactone (ALDACTONE) 100 MG tablet Take 150 mg by mouth.    . zinc gluconate 50 MG tablet Take 50 mg by mouth 3 (three) times daily.      No current facility-administered medications on file prior to visit.    Review of Systems All otherwise neg per pt     Objective:   Physical Exam BP 124/72   Pulse 60   Ht 6' (1.829 m)   Wt 169 lb (76.7 kg)   SpO2 100%   BMI 22.92 kg/m  VS noted,  Constitutional: Pt appears in NAD HENT: Head: NCAT.  Right Ear: External ear normal.  Left Ear: External ear normal.  Eyes: . Pupils are equal, round, and reactive to light. Conjunctivae and EOM are normal Nose: without d/c or deformity Neck: Neck supple. Gross normal ROM Cardiovascular: Normal rate and regular rhythm.   Pulmonary/Chest: Effort normal and breath sounds without rales or wheezing.  Abd:  Soft, NT, ND, + BS, no organomegaly Neurological: Pt is alert. At baseline orientation, motor grossly intact Skin: Skin is warm. No rashes, other new lesions, no LE edema Psychiatric: Pt behavior is normal without agitation  No other exam findings    Assessment & Plan:

## 2016-12-28 NOTE — Progress Notes (Signed)
Pre visit review using our clinic review tool, if applicable. No additional management support is needed unless otherwise documented below in the visit note. 

## 2016-12-28 NOTE — Assessment & Plan Note (Signed)
Overall improved, stable, for cont'd lexapro asd,  to f/u any worsening symptoms or concerns

## 2016-12-28 NOTE — Patient Instructions (Signed)
Please continue all other medications as before, and refills have been done if requested.  Please have the pharmacy call with any other refills you may need.  Please keep your appointments with your specialists as you may have planned  Please return in 6 months, or sooner if needed 

## 2016-12-28 NOTE — Assessment & Plan Note (Signed)
stable overall by history and exam, recent data reviewed with pt, and pt to continue medical treatment as before,  to f/u any worsening symptoms or concerns Lab Results  Component Value Date   HGBA1C 4.4 (L) 01/07/2013   

## 2017-01-04 DIAGNOSIS — F312 Bipolar disorder, current episode manic severe with psychotic features: Secondary | ICD-10-CM | POA: Diagnosis not present

## 2017-01-08 ENCOUNTER — Encounter (INDEPENDENT_AMBULATORY_CARE_PROVIDER_SITE_OTHER): Payer: Self-pay | Admitting: Orthopaedic Surgery

## 2017-01-08 ENCOUNTER — Ambulatory Visit (INDEPENDENT_AMBULATORY_CARE_PROVIDER_SITE_OTHER): Payer: Medicare HMO

## 2017-01-08 ENCOUNTER — Ambulatory Visit (INDEPENDENT_AMBULATORY_CARE_PROVIDER_SITE_OTHER): Payer: Medicare HMO | Admitting: Orthopaedic Surgery

## 2017-01-08 DIAGNOSIS — S52501A Unspecified fracture of the lower end of right radius, initial encounter for closed fracture: Secondary | ICD-10-CM

## 2017-01-08 DIAGNOSIS — M25531 Pain in right wrist: Secondary | ICD-10-CM

## 2017-01-08 NOTE — Progress Notes (Signed)
Patient is 2 weeks status post ORIF right distal radius fracture. He is doing well. Not taking pain medicines. The incision is healed. The sutures were removed. Removable wrist brace was given today. Continue nonweightbearing. Follow up in 3 weeks with repeat 2 view x-rays of the right wrist. We'll begin therapy at that time.

## 2017-01-11 DIAGNOSIS — C220 Liver cell carcinoma: Secondary | ICD-10-CM | POA: Diagnosis not present

## 2017-01-19 ENCOUNTER — Telehealth: Payer: Self-pay | Admitting: Internal Medicine

## 2017-04-13 DIAGNOSIS — I81 Portal vein thrombosis: Secondary | ICD-10-CM | POA: Diagnosis not present

## 2017-04-13 DIAGNOSIS — C220 Liver cell carcinoma: Secondary | ICD-10-CM | POA: Diagnosis not present

## 2017-04-13 DIAGNOSIS — K7460 Unspecified cirrhosis of liver: Secondary | ICD-10-CM | POA: Diagnosis not present

## 2017-04-13 DIAGNOSIS — K769 Liver disease, unspecified: Secondary | ICD-10-CM | POA: Diagnosis not present

## 2017-04-13 DIAGNOSIS — F17210 Nicotine dependence, cigarettes, uncomplicated: Secondary | ICD-10-CM | POA: Diagnosis not present

## 2017-04-13 DIAGNOSIS — Z9114 Patient's other noncompliance with medication regimen: Secondary | ICD-10-CM | POA: Diagnosis not present

## 2017-04-13 DIAGNOSIS — R188 Other ascites: Secondary | ICD-10-CM | POA: Diagnosis not present

## 2017-04-13 DIAGNOSIS — G9340 Encephalopathy, unspecified: Secondary | ICD-10-CM | POA: Diagnosis not present

## 2017-04-13 DIAGNOSIS — R770 Abnormality of albumin: Secondary | ICD-10-CM | POA: Diagnosis not present

## 2017-04-19 ENCOUNTER — Telehealth: Payer: Self-pay | Admitting: Internal Medicine

## 2017-04-19 NOTE — Telephone Encounter (Signed)
Pt called stating he would like a refill of levETIRAcetam (KEPPRA) 1000 MG  Last seen 12/28/2016  [facility] on [address]    Also he would like a referral to a Neurologists  Please advise

## 2017-04-20 MED ORDER — LEVETIRACETAM 1000 MG PO TABS: 1000.0000 mg | ORAL_TABLET | Freq: Two times a day (BID) | ORAL | 1 refills | Status: DC

## 2017-04-20 NOTE — Telephone Encounter (Signed)
Called pt no answer LMOM MD ok refill has been sent to [facility], and placed referral.../lmb

## 2017-04-20 NOTE — Telephone Encounter (Signed)
Done erx 

## 2017-05-01 DIAGNOSIS — K766 Portal hypertension: Secondary | ICD-10-CM | POA: Diagnosis not present

## 2017-05-01 DIAGNOSIS — C220 Liver cell carcinoma: Secondary | ICD-10-CM | POA: Diagnosis not present

## 2017-05-01 DIAGNOSIS — I81 Portal vein thrombosis: Secondary | ICD-10-CM | POA: Diagnosis not present

## 2017-05-01 DIAGNOSIS — K7460 Unspecified cirrhosis of liver: Secondary | ICD-10-CM | POA: Diagnosis not present

## 2017-05-01 DIAGNOSIS — Z9889 Other specified postprocedural states: Secondary | ICD-10-CM | POA: Diagnosis not present

## 2017-05-01 DIAGNOSIS — K769 Liver disease, unspecified: Secondary | ICD-10-CM | POA: Diagnosis not present

## 2017-08-07 NOTE — Telephone Encounter (Signed)
error 

## 2017-08-23 DIAGNOSIS — K7460 Unspecified cirrhosis of liver: Secondary | ICD-10-CM | POA: Diagnosis not present

## 2017-08-23 DIAGNOSIS — I81 Portal vein thrombosis: Secondary | ICD-10-CM | POA: Diagnosis not present

## 2017-08-23 DIAGNOSIS — Z923 Personal history of irradiation: Secondary | ICD-10-CM | POA: Diagnosis not present

## 2017-08-23 DIAGNOSIS — K766 Portal hypertension: Secondary | ICD-10-CM | POA: Diagnosis not present

## 2017-08-23 DIAGNOSIS — C220 Liver cell carcinoma: Secondary | ICD-10-CM | POA: Diagnosis not present

## 2017-08-23 DIAGNOSIS — K7031 Alcoholic cirrhosis of liver with ascites: Secondary | ICD-10-CM | POA: Diagnosis not present

## 2017-08-23 DIAGNOSIS — K769 Liver disease, unspecified: Secondary | ICD-10-CM | POA: Diagnosis not present

## 2017-09-21 ENCOUNTER — Telehealth: Payer: Self-pay | Admitting: Internal Medicine

## 2017-09-21 MED ORDER — LEVOCETIRIZINE DIHYDROCHLORIDE 5 MG PO TABS: 5.0000 mg | ORAL_TABLET | Freq: Every evening | ORAL | 3 refills | Status: DC

## 2017-09-21 MED ORDER — LEVETIRACETAM 1000 MG PO TABS: 1000.0000 mg | ORAL_TABLET | Freq: Two times a day (BID) | ORAL | 3 refills | Status: DC

## 2017-09-21 NOTE — Telephone Encounter (Signed)
Copied from [place] [id number]. Topic: Quick Communication - Rx Refill/Question >> Sep 21, 2017 11:48 AM Cleaster Corin, NT wrote: Medication: leveticetam   Has the patient contacted their pharmacy? no   (Agent: If no, request that the patient contact the pharmacy for the refill.)   Preferred Pharmacy (with phone number or street name): [facility] [phone] [name], [place] [place] [address] Phone: [phone] Fax: [phone]     Agent: Please be advised that RX refills may take up to 3 business days. We ask that you follow-up with your pharmacy.

## 2017-09-21 NOTE — Addendum Note (Signed)
Addended by: Dimple Nanas on: 09/21/2017 05:39 PM   Modules accepted: Orders

## 2017-09-21 NOTE — Telephone Encounter (Signed)
Done erx 

## 2017-09-21 NOTE — Addendum Note (Signed)
Addended by: Biagio Borg on: 09/21/2017 01:16 PM   Modules accepted: Orders

## 2017-09-21 NOTE — Telephone Encounter (Signed)
Rx  Request  For   levetiracetam last  Ov   12/28/16       Last   Fill   04/20/2017   With  1  Refill

## 2017-09-21 NOTE — Telephone Encounter (Signed)
Wrong medication refilled and sent to wrong pharmacy. Pt is at the pharmacy at this time waiting for the medication. Levetiracetam(Keppra) refilled and sent to [facility] on [address] as requested. Levocetirizine was filled previously and sent to wrong pharmacy.

## 2017-10-03 ENCOUNTER — Other Ambulatory Visit: Payer: Self-pay | Admitting: Internal Medicine

## 2017-11-12 DIAGNOSIS — Z809 Family history of malignant neoplasm, unspecified: Secondary | ICD-10-CM | POA: Diagnosis not present

## 2017-11-12 DIAGNOSIS — Z833 Family history of diabetes mellitus: Secondary | ICD-10-CM | POA: Diagnosis not present

## 2017-11-12 DIAGNOSIS — Z8249 Family history of ischemic heart disease and other diseases of the circulatory system: Secondary | ICD-10-CM | POA: Diagnosis not present

## 2017-11-12 DIAGNOSIS — R69 Illness, unspecified: Secondary | ICD-10-CM | POA: Diagnosis not present

## 2017-11-12 DIAGNOSIS — R609 Edema, unspecified: Secondary | ICD-10-CM | POA: Diagnosis not present

## 2017-11-12 DIAGNOSIS — R569 Unspecified convulsions: Secondary | ICD-10-CM | POA: Diagnosis not present

## 2017-11-12 DIAGNOSIS — K7460 Unspecified cirrhosis of liver: Secondary | ICD-10-CM | POA: Diagnosis not present

## 2017-11-12 DIAGNOSIS — Z823 Family history of stroke: Secondary | ICD-10-CM | POA: Diagnosis not present

## 2017-11-12 DIAGNOSIS — Z87891 Personal history of nicotine dependence: Secondary | ICD-10-CM | POA: Diagnosis not present

## 2017-12-04 ENCOUNTER — Encounter: Payer: Self-pay | Admitting: Internal Medicine

## 2017-12-04 ENCOUNTER — Ambulatory Visit (INDEPENDENT_AMBULATORY_CARE_PROVIDER_SITE_OTHER): Payer: Medicare HMO | Admitting: Internal Medicine

## 2017-12-04 VITALS — BP 116/76 | HR 60 | Temp 98.1°F | Ht 72.0 in | Wt 182.0 lb

## 2017-12-04 DIAGNOSIS — R7302 Impaired glucose tolerance (oral): Secondary | ICD-10-CM

## 2017-12-04 DIAGNOSIS — K4090 Unilateral inguinal hernia, without obstruction or gangrene, not specified as recurrent: Secondary | ICD-10-CM | POA: Diagnosis not present

## 2017-12-04 DIAGNOSIS — K769 Liver disease, unspecified: Secondary | ICD-10-CM | POA: Diagnosis not present

## 2017-12-04 DIAGNOSIS — K7460 Unspecified cirrhosis of liver: Secondary | ICD-10-CM | POA: Diagnosis not present

## 2017-12-04 MED ORDER — FUROSEMIDE 20 MG PO TABS: ORAL_TABLET | ORAL | 1 refills | Status: DC

## 2017-12-04 NOTE — Patient Instructions (Signed)
You will be contacted regarding the referral for: General Surgury  Please continue all other medications as before, and refills have been done if requested.  Please have the pharmacy call with any other refills you may need.  Please keep your appointments with your specialists as you may have planned

## 2017-12-04 NOTE — Progress Notes (Signed)
Subjective:    Patient ID: [name], male    DOB: 1966-04-16, 52 y.o.   MRN: [id number]  HPI  Here to f/u with mild intermittent dull left inguinal area pain, swelling x 1 month, ? Umbilical as was sore there, but less now and has more left inguinal pain and swelling.  S/p prior [facility] repair in 2012 with mesh.  Also has lighter color stools since hernia worse, has 1 BM every 2-days, no n/v, fever. Pt denies chest pain, increased sob or doe, wheezing, orthopnea, PND, increased LE swelling, palpitations, dizziness or syncope.  Wt Readings from Last 3 Encounters:  12/04/17 182 lb (82.6 kg)  12/28/16 169 lb (76.7 kg)  12/21/16 181 lb 7 oz (82.3 kg)  Also has f/u appt with [facility] in may.  Hs not been able to get timely refills of his lasix so has cut back some with increased abd girth and mild leg swelling, concerned about fluid retention  Also has worsening Eczema with scratching to legs before bed.   Had colonoscopy at 52yo with egd, and due for f/u at 3 yrs Past Medical History:  Diagnosis Date  . Abuse, drug or alcohol ([place])   . Bipolar affective disorder ([place])    [place]  . Cancer [facility])    liver cancer treated with micrablation  . Chronic liver disease and cirrhosis   . Depression   . DJD (degenerative joint disease)    right knee  . Dyslipidemia   . Encephalopathy   . Esophageal varices ([place])   . Hernia   . History of alcohol abuse   . PONV (postoperative nausea and vomiting)   . Seizures ([place])    Dr Jannifer Franklin  . Thrombocytopenia ([place])   . Wilson disease   . Wrist fracture    right   Past Surgical History:  Procedure Laterality Date  . ESOPHAGOGASTRODUODENOSCOPY    . HARDWARE REMOVAL Right 01/22/2015   Procedure: HARDWARE REMOVAL RIGHT LEG;  Surgeon: Leandrew Koyanagi, MD;  Location: [place];  Service: Orthopedics;  Laterality: Right;  . left inguinal hernia  june 2012   [place]  . liver cancer  02/2015   ablation  . microablation of  liver    . OPEN REDUCTION INTERNAL FIXATION (ORIF) DISTAL RADIAL FRACTURE Right 12/21/2016   Procedure: OPEN REDUCTION INTERNAL FIXATION (ORIF) RIGHT DISTAL RADIUS FRACTURE;  Surgeon: Leandrew Koyanagi, MD;  Location: [place];  Service: Orthopedics;  Laterality: Right;  . OPEN REDUCTION INTERNAL FIXATION (ORIF) TIBIA/FIBULA FRACTURE Right 07/25/2013   Procedure: OPEN REDUCTION INTERNAL FIXATION (ORIF) RIGHT TIBIAL PLATEAU, TIBIAL SHAFT AND FIBULA FRACTURES, POSSIBLE FASCIOTOMIES;  Surgeon: Marianna Payment, MD;  Location: [place];  Service: Orthopedics;  Laterality: Right;  . TOTAL KNEE ARTHROPLASTY Right 07/15/2015   Procedure: RIGHT TOTAL KNEE ARTHROPLASTY;  Surgeon: Leandrew Koyanagi, MD;  Location: [place];  Service: Orthopedics;  Laterality: Right;  [name] VASCULAR SURGERY      reports that he has been smoking cigarettes.  He has been smoking about 0.25 packs per day. He has never used smokeless tobacco. He reports that he does not drink alcohol or use drugs. family history includes Dementia in his mother; Diabetes in his father; Glaucoma in his brother; Heart disease in his other. Allergies  Allergen Reactions  . Codeine Nausea And Vomiting    Passed out, was also drinking at the time   Current Outpatient Medications on File Prior to Visit  Medication Sig Dispense Refill  . acetaminophen (TYLENOL) 500 MG tablet Take 500 mg by mouth every 6 (six) hours as needed (pain). Take 3 tabs as needed for pain.    [name] HYDROmorphone (DILAUDID) 2 MG tablet Take 1 tablet (2 mg total) by mouth every 4 (four) hours as needed for severe pain. 60 tablet 0  . lactulose (CEPHULAC) 10 G packet Take 10 g by mouth daily as needed (toxin buildup).     [name] levETIRAcetam (KEPPRA) 1000 MG tablet Take 1 tablet (1,000 mg total) by mouth 2 (two) times daily. 180 tablet 3  . levocetirizine (XYZAL) 5 MG tablet Take 1 tablet (5 mg total) by mouth every evening. 90 tablet 3  . MILK THISTLE PO Take 1 tablet by mouth daily.     [name]  oxyCODONE-acetaminophen (PERCOCET/ROXICET) 5-325 MG tablet Take 1-2 tablets by mouth every 6 (six) hours as needed for severe pain. 30 tablet 0  . potassium chloride (K-DUR) 10 MEQ tablet TAKE 1 TABLET BY MOUTH TWICE DAILY 60 tablet 11  . potassium chloride (K-DUR) 10 MEQ tablet TAKE 1 TABLET BY MOUTH TWICE DAILY 180 tablet 0  . propranolol (INDERAL) 10 MG tablet Take 10 mg by mouth 2 (two) times daily.     . ranitidine (ZANTAC) 75 MG tablet Take 75 mg by mouth daily as needed for heartburn.    . senna-docusate (SENOKOT S) 8.6-50 MG tablet Take 1 tablet by mouth at bedtime as needed. 30 tablet 1  . spironolactone (ALDACTONE) 100 MG tablet Take 150 mg by mouth.    . zinc gluconate 50 MG tablet Take 50 mg by mouth 3 (three) times daily.     [name] escitalopram (LEXAPRO) 10 MG tablet Take 1 tablet (10 mg total) by mouth daily. 90 tablet 3   No current facility-administered medications on file prior to visit.     Review of Systems  Constitutional: Negative for other unusual diaphoresis or sweats HENT: Negative for ear discharge or swelling Eyes: Negative for other worsening visual disturbances Respiratory: Negative for stridor or other swelling  Gastrointestinal: Negative for worsening distension or other blood Genitourinary: Negative for retention or other urinary change Musculoskeletal: Negative for other MSK pain or swelling Skin: Negative for color change or other new lesions Neurological: Negative for worsening tremors and other numbness  Psychiatric/Behavioral: Negative for worsening agitation or other fatigue All other system neg per pt    Objective:   Physical Exam BP 116/76   Pulse 60   Temp 98.1 F (36.7 C) (Oral)   Ht 6' (1.829 m)   Wt 182 lb (82.6 kg)   SpO2 99%   BMI 24.68 kg/m  VS noted,  Constitutional: Pt appears in NAD HENT: Head: NCAT.  Right Ear: External ear normal.  Left Ear: External ear normal.  Eyes: . Pupils are equal, round, and reactive to light.  Conjunctivae and EOM are normal Nose: without d/c or deformity Neck: Neck supple. Gross normal ROM Cardiovascular: Normal rate and regular rhythm.   Pulmonary/Chest: Effort normal and breath sounds without rales or wheezing.  Abd:  Soft, NT, ND, + BS, no organomegaly GU: + left inguinal swelling, mild tender, reducible Neurological: Pt is alert. At baseline orientation, motor grossly intact Skin: Skin is warm. No rashes, other new lesions, no LE edema Psychiatric: Pt behavior is normal without agitation  No other exam findings Lab Results  Component Value Date   WBC 4.7 12/21/2016   HGB 12.0 (L) 12/21/2016   HCT 35.7 (L) 12/21/2016  PLT 60 (L) 12/21/2016   GLUCOSE 64 (L) 12/21/2016   CHOL 141 06/07/2016   TRIG 86.0 06/07/2016   HDL 42.70 06/07/2016   LDLDIRECT 117.8 01/07/2013   LDLCALC 81 06/07/2016   ALT 33 12/21/2016   AST 51 (H) 12/21/2016   NA 137 12/21/2016   K 4.1 12/21/2016   CL 109 12/21/2016   CREATININE 0.67 12/21/2016   BUN 10 12/21/2016   CO2 21 (L) 12/21/2016   TSH 1.01 06/07/2016   PSA 0.11 06/07/2016   INR 1.31 12/21/2016   HGBA1C 4.4 (L) 01/07/2013   Declines further labs today    Assessment & Plan:

## 2017-12-08 ENCOUNTER — Encounter: Payer: Self-pay | Admitting: Internal Medicine

## 2017-12-08 NOTE — Assessment & Plan Note (Signed)
stable overall by history and exam, recent data reviewed with pt, and pt to continue medical treatment as before,  to f/u any worsening symptoms or concerns Lab Results  Component Value Date   HGBA1C 4.4 (L) 01/07/2013

## 2017-12-08 NOTE — Assessment & Plan Note (Signed)
For refill lasix to maintain daily treatment and avoid further wt gain and ascites

## 2017-12-08 NOTE — Assessment & Plan Note (Signed)
Mild symptomatic, for refer General Surgury

## 2017-12-12 ENCOUNTER — Other Ambulatory Visit: Payer: Self-pay | Admitting: Internal Medicine

## 2017-12-12 MED ORDER — POTASSIUM CHLORIDE ER 10 MEQ PO TBCR: 10.0000 meq | EXTENDED_RELEASE_TABLET | Freq: Two times a day (BID) | ORAL | 0 refills | Status: DC

## 2017-12-19 ENCOUNTER — Other Ambulatory Visit: Payer: Self-pay | Admitting: Surgery

## 2017-12-19 DIAGNOSIS — K4090 Unilateral inguinal hernia, without obstruction or gangrene, not specified as recurrent: Secondary | ICD-10-CM | POA: Diagnosis not present

## 2017-12-20 ENCOUNTER — Other Ambulatory Visit: Payer: Self-pay | Admitting: Surgery

## 2017-12-20 DIAGNOSIS — K4090 Unilateral inguinal hernia, without obstruction or gangrene, not specified as recurrent: Secondary | ICD-10-CM

## 2017-12-23 ENCOUNTER — Encounter: Payer: Self-pay | Admitting: Internal Medicine

## 2017-12-24 MED ORDER — POTASSIUM CHLORIDE ER 10 MEQ PO TBCR: 10.0000 meq | EXTENDED_RELEASE_TABLET | Freq: Two times a day (BID) | ORAL | 0 refills | Status: DC

## 2017-12-26 ENCOUNTER — Other Ambulatory Visit: Payer: Medicare HMO

## 2017-12-28 ENCOUNTER — Ambulatory Visit
Admission: RE | Admit: 2017-12-28 | Discharge: 2017-12-28 | Disposition: A | Discharge status: Home / Self Care | Payer: Medicare HMO | Source: Ambulatory Visit | Attending: Surgery | Admitting: Surgery

## 2017-12-28 DIAGNOSIS — N5089 Other specified disorders of the male genital organs: Secondary | ICD-10-CM | POA: Diagnosis not present

## 2017-12-28 DIAGNOSIS — K4090 Unilateral inguinal hernia, without obstruction or gangrene, not specified as recurrent: Secondary | ICD-10-CM

## 2017-12-28 MED ORDER — IOPAMIDOL (ISOVUE-300) INJECTION 61%
100.0000 mL | Freq: Once | INTRAVENOUS | Status: AC | PRN
  Administered 2017-12-28: 100 mL via INTRAVENOUS

## 2018-01-14 ENCOUNTER — Encounter (HOSPITAL_COMMUNITY): Payer: Self-pay

## 2018-01-14 ENCOUNTER — Other Ambulatory Visit: Payer: Self-pay | Admitting: Surgery

## 2018-01-14 DIAGNOSIS — K4020 Bilateral inguinal hernia, without obstruction or gangrene, not specified as recurrent: Secondary | ICD-10-CM | POA: Diagnosis not present

## 2018-01-14 NOTE — H&P (Signed)
[name]  Location: [facility] Patient #: [id number] DOB: 05-Dec-1965 Single / Language: Cleophus Molt / Race: White Male   History of Present Illness    The patient is a 52 year old male who presents with an inguinal hernia. This gentleman is referred by Dr. Cathlean Cower for evaluation of a left inguinal hernia. He has a very complex history of liver disease having had a previous micro-ablation of a primary liver cancer. He is followed closely at [facility] for this. He had a left inguinal hernia repair with mesh performed there through the scrotum in 2012 by one of the former transplant surgeons. He has now had increasing swelling in the left inguinal area for several months. He most recently had an MRI of just the abdomen following up his liver and Gaspar Cola in December. At that time, his platelet count was only 48,000. He reports now being a Jehov[employer]'s Witness. He denies any obstructive symptoms. He reports that the hernia were reduced after laying down at night. He has mild to moderate discomfort in the left inguinal area  Past Medical History:  Diagnosis Date  . Abuse, drug or alcohol ([place])   . Bipolar affective disorder ([place])    [name]  . Cancer [facility])    liver cancer treated with micrablation  . Chronic liver disease and cirrhosis   . Depression   . DJD (degenerative joint disease)    right knee  . Dyslipidemia   . Encephalopathy   . Esophageal varices ([place])   . Hernia   . History of alcohol abuse   . PONV (postoperative nausea and vomiting)   . Seizures ([place])    Dr Jannifer Franklin  . Thrombocytopenia ([place])   . Wilson disease   . Wrist fracture    right        Past Surgical History:  Procedure Laterality Date  . ESOPHAGOGASTRODUODENOSCOPY    . HARDWARE REMOVAL Right 01/22/2015   Procedure: HARDWARE REMOVAL RIGHT LEG;  Surgeon: Leandrew Koyanagi, MD;  Location: [place];  Service: Orthopedics;  Laterality: Right;  .  left inguinal hernia  june 2012   [place]  . liver cancer  02/2015   ablation  . microablation of liver    . OPEN REDUCTION INTERNAL FIXATION (ORIF) DISTAL RADIAL FRACTURE Right 12/21/2016   Procedure: OPEN REDUCTION INTERNAL FIXATION (ORIF) RIGHT DISTAL RADIUS FRACTURE;  Surgeon: Leandrew Koyanagi, MD;  Location: [place];  Service: Orthopedics;  Laterality: Right;  . OPEN REDUCTION INTERNAL FIXATION (ORIF) TIBIA/FIBULA FRACTURE Right 07/25/2013   Procedure: OPEN REDUCTION INTERNAL FIXATION (ORIF) RIGHT TIBIAL PLATEAU, TIBIAL SHAFT AND FIBULA FRACTURES, POSSIBLE FASCIOTOMIES;  Surgeon: Marianna Payment, MD;  Location: [place];  Service: Orthopedics;  Laterality: Right;  . TOTAL KNEE ARTHROPLASTY Right 07/15/2015   Procedure: RIGHT TOTAL KNEE ARTHROPLASTY;  Surgeon: Leandrew Koyanagi, MD;  Location: [place];  Service: Orthopedics;  Laterality: Right;  [name] VASCULAR SURGERY         Allergies  Allergies Reconciled   Medication History  LevETIRAcetam (1000MG  Tablet, Oral) Active. Tylenol 8 Hour (650MG  Tablet ER, Oral) Active. Lasix (20MG  Tablet, Oral) Active. Lactulose (10GM/15ML Syrup, Oral) Active. Potassium Chloride ER (10MEQ Tablet ER, Oral) Active. Inderal (10MG  Tablet, Oral) Active. Aldactone (100MG  Tablet, Oral) Active. Zinc Gluconate (50MG  Tablet, Oral) Active. Medications Reconciled  Vitals   Weight: 178.5 lb Height: 72in Body Surface Area: 2.03 m Body Mass Index: 24.21 kg/m  Temp.: 97.48F(Oral)  Pulse: 60 (Regular)  BP: 130/80 (Sitting, Right Arm, Standard)    Physical Exam  The physical exam findings are as follows: Note:He actually looks fairly well today on physical examination. He is not jaundiced. His abdomen is soft. There is a large amount of swelling in the left scrotum. I could not feel any bowel along the inguinal canal and left side. I suspect this may be ascites    Assessment & Plan   Impression: I discussed the diagnosis  with him in detail. This may represent just ascites and not a recurrent hernia although it is difficult to tell. I believe he needs a CAT scan of his abdomen and pelvis to help define the inguinal anatomy see if we are just dealing with ascites versus a true recurrent hernia containing incarcerated contents. I will need to check his platelet count as well as his PT and PTT to see if surgery is even an option here versus being sent back to [facility]. I will see him back after the studies are complete.  Addendum: Assessment & Plan (Zury Fazzino A. Ninfa Linden MD; 01/14/2018 2:35 PM) BILATERAL INGUINAL HERNIA (K40.20) Impression: He would like to proceed with bilateral open inguinal hernia repair with mesh. I would need to do this open to close both sacs. These are direct hernias to prevent ascitic fluid from continuing to go into the scrotum. We discussed the risks of surgery which includes but is not limited to bleeding, infection, injury to surrounding structures, hernia recurrence, chronic pain, DVT, etc. We will have to repeat his labs preoperatively. He understands that surgery may be canceled if his platelet count is too low etc.

## 2018-01-15 ENCOUNTER — Ambulatory Visit (HOSPITAL_COMMUNITY)
Admission: RE | Admit: 2018-01-15 | Discharge: 2018-01-15 | Disposition: A | Discharge status: Home / Self Care | Payer: Medicare HMO | Source: Ambulatory Visit | Attending: Surgery | Admitting: Surgery

## 2018-01-15 ENCOUNTER — Encounter (HOSPITAL_COMMUNITY): Payer: Self-pay | Admitting: Certified Registered Nurse Anesthetist

## 2018-01-15 ENCOUNTER — Encounter (HOSPITAL_COMMUNITY)
Admission: RE | Disposition: A | Discharge status: Home / Self Care | Payer: Self-pay | Source: Ambulatory Visit | Attending: Surgery

## 2018-01-15 DIAGNOSIS — K7460 Unspecified cirrhosis of liver: Secondary | ICD-10-CM | POA: Diagnosis not present

## 2018-01-15 DIAGNOSIS — G9340 Encephalopathy, unspecified: Secondary | ICD-10-CM | POA: Diagnosis not present

## 2018-01-15 DIAGNOSIS — Z5309 Procedure and treatment not carried out because of other contraindication: Secondary | ICD-10-CM | POA: Insufficient documentation

## 2018-01-15 DIAGNOSIS — R69 Illness, unspecified: Secondary | ICD-10-CM | POA: Diagnosis not present

## 2018-01-15 DIAGNOSIS — F1011 Alcohol abuse, in remission: Secondary | ICD-10-CM | POA: Diagnosis not present

## 2018-01-15 DIAGNOSIS — F319 Bipolar disorder, unspecified: Secondary | ICD-10-CM | POA: Insufficient documentation

## 2018-01-15 DIAGNOSIS — Z79899 Other long term (current) drug therapy: Secondary | ICD-10-CM | POA: Diagnosis not present

## 2018-01-15 DIAGNOSIS — D696 Thrombocytopenia, unspecified: Secondary | ICD-10-CM | POA: Insufficient documentation

## 2018-01-15 DIAGNOSIS — E785 Hyperlipidemia, unspecified: Secondary | ICD-10-CM | POA: Insufficient documentation

## 2018-01-15 DIAGNOSIS — Z8505 Personal history of malignant neoplasm of liver: Secondary | ICD-10-CM | POA: Diagnosis not present

## 2018-01-15 DIAGNOSIS — K4020 Bilateral inguinal hernia, without obstruction or gangrene, not specified as recurrent: Secondary | ICD-10-CM | POA: Diagnosis present

## 2018-01-15 DIAGNOSIS — M1990 Unspecified osteoarthritis, unspecified site: Secondary | ICD-10-CM | POA: Insufficient documentation

## 2018-01-15 DIAGNOSIS — I8500 Esophageal varices without bleeding: Secondary | ICD-10-CM | POA: Insufficient documentation

## 2018-01-15 DIAGNOSIS — Z96651 Presence of right artificial knee joint: Secondary | ICD-10-CM | POA: Diagnosis not present

## 2018-01-15 DIAGNOSIS — R569 Unspecified convulsions: Secondary | ICD-10-CM | POA: Diagnosis not present

## 2018-01-15 HISTORY — DX: Anemia, unspecified: D64.9

## 2018-01-15 LAB — CBC WITH DIFFERENTIAL/PLATELET
Abs Immature Granulocytes: 0 10*3/uL (ref 0.0–0.1)
Basophils Absolute: 0.1 10*3/uL (ref 0.0–0.1)
Basophils Relative: 1 %
Eosinophils Absolute: 0.1 10*3/uL (ref 0.0–0.7)
Eosinophils Relative: 2 %
HCT: 42.3 % (ref 39.0–52.0)
Hemoglobin: 13.8 g/dL (ref 13.0–17.0)
Immature Granulocytes: 0 %
Lymphocytes Relative: 23 %
Lymphs Abs: 1.1 10*3/uL (ref 0.7–4.0)
MCH: 33 pg (ref 26.0–34.0)
MCHC: 32.6 g/dL (ref 30.0–36.0)
MCV: 101.2 fL — ABNORMAL HIGH (ref 78.0–100.0)
Monocytes Absolute: 0.4 10*3/uL (ref 0.1–1.0)
Monocytes Relative: 9 %
Neutro Abs: 3.1 10*3/uL (ref 1.7–7.7)
Neutrophils Relative %: 65 %
Platelets: 48 10*3/uL — ABNORMAL LOW (ref 150–400)
RBC: 4.18 MIL/uL — ABNORMAL LOW (ref 4.22–5.81)
RDW: 15.4 % (ref 11.5–15.5)
WBC: 4.9 10*3/uL (ref 4.0–10.5)

## 2018-01-15 LAB — COMPREHENSIVE METABOLIC PANEL
ALT: 42 U/L (ref 17–63)
AST: 39 U/L (ref 15–41)
Albumin: 3 g/dL — ABNORMAL LOW (ref 3.5–5.0)
Alkaline Phosphatase: 119 U/L (ref 38–126)
Anion gap: 7 (ref 5–15)
BUN: 14 mg/dL (ref 6–20)
CO2: 22 mmol/L (ref 22–32)
Calcium: 9.4 mg/dL (ref 8.9–10.3)
Chloride: 107 mmol/L (ref 101–111)
Creatinine, Ser: 0.73 mg/dL (ref 0.61–1.24)
GFR calc Af Amer: >60 mL/min (ref >60)
GFR calc non Af Amer: >60 mL/min (ref >60)
Glucose, Bld: 85 mg/dL (ref 65–99)
Potassium: 3.7 mmol/L (ref 3.5–5.1)
Sodium: 136 mmol/L (ref 135–145)
Total Bilirubin: 1.8 mg/dL — ABNORMAL HIGH (ref 0.3–1.2)
Total Protein: 6.1 g/dL — ABNORMAL LOW (ref 6.5–8.1)

## 2018-01-15 LAB — PROTIME-INR
INR: 1.49
Prothrombin Time: 17.9 seconds — ABNORMAL HIGH (ref 11.4–15.2)

## 2018-01-15 LAB — NO BLOOD PRODUCTS

## 2018-01-15 SURGERY — CANCELLED PROCEDURE

## 2018-01-15 MED ORDER — CHLORHEXIDINE GLUCONATE CLOTH 2 % EX PADS: 6.0000 | MEDICATED_PAD | Freq: Once | CUTANEOUS | Status: DC

## 2018-01-15 MED ORDER — LACTATED RINGERS IV SOLN
INTRAVENOUS | Status: DC
  Administered 2018-01-15: 10 mL/h via INTRAVENOUS

## 2018-01-15 MED ORDER — CEFAZOLIN SODIUM-DEXTROSE 2-4 GM/100ML-% IV SOLN
INTRAVENOUS | Status: AC
  Filled 2018-01-15: qty 100

## 2018-01-15 MED ORDER — CEFAZOLIN SODIUM-DEXTROSE 2-4 GM/100ML-% IV SOLN: 2.0000 g | INTRAVENOUS | Status: DC

## 2018-01-15 MED ORDER — MIDAZOLAM HCL 2 MG/2ML IJ SOLN
INTRAMUSCULAR | Status: AC
  Filled 2018-01-15: qty 2

## 2018-01-15 MED ORDER — FENTANYL CITRATE (PF) 250 MCG/5ML IJ SOLN
INTRAMUSCULAR | Status: AC
Start: 2018-01-15 — End: ?
  Filled 2018-01-15: qty 5

## 2018-01-15 MED ORDER — PROPOFOL 10 MG/ML IV BOLUS
INTRAVENOUS | Status: AC
  Filled 2018-01-15: qty 20

## 2018-01-15 MED ORDER — BUPIVACAINE-EPINEPHRINE (PF) 0.5% -1:200000 IJ SOLN
INTRAMUSCULAR | Status: AC
  Filled 2018-01-15: qty 60

## 2018-01-15 NOTE — Progress Notes (Signed)
Procedure cancelled d/t low platelets. Pt discharged to home, IV removed.

## 2018-01-15 NOTE — Progress Notes (Signed)
Patient ID: [name], male   DOB: 1966-01-27, 52 y.o.   MRN: [id number]   Unfortunately, patient's platelet count is down to 48K.  As this is elective surgery, will need to cancel his procedure.  He will be seeing his medical physicians at [facility] to address this prior to rescheduling surgery. I will see him back in the office

## 2018-01-15 NOTE — Anesthesia Preprocedure Evaluation (Deleted)
Anesthesia Evaluation  Patient identified by MRN, date of birth, ID band Patient awake    Reviewed: Allergy & Precautions, NPO status , Patient's Chart, lab work & pertinent test results  Airway Mallampati: II  TM Distance: >3 FB Neck ROM: Full    Dental  (+) Teeth Intact, Dental Advisory Given   Pulmonary Current Smoker,    breath sounds clear to auscultation       Cardiovascular  Rhythm:Regular Rate:Normal     Neuro/Psych    GI/Hepatic   Endo/Other    Renal/GU      Musculoskeletal   Abdominal   Peds  Hematology   Anesthesia Other Findings   Reproductive/Obstetrics                             Anesthesia Physical Anesthesia Plan  ASA: III  Anesthesia Plan: General   Post-op Pain Management:    Induction: Intravenous  PONV Risk Score and Plan: Ondansetron and Dexamethasone  Airway Management Planned: Oral ETT  Additional Equipment:   Intra-op Plan:   Post-operative Plan: Extubation in OR  Informed Consent: I have reviewed the patients History and Physical, chart, labs and discussed the procedure including the risks, benefits and alternatives for the proposed anesthesia with the patient or authorized representative who has indicated his/her understanding and acceptance.     Dental advisory given  Plan Discussed with: Anesthesiologist and CRNA  Anesthesia Plan Comments:         Anesthesia Quick Evaluation  

## 2018-01-15 NOTE — Interval H&P Note (Signed)
History and Physical Interval Note: no change in H and P  01/15/2018 1:29 PM  [name]  has presented today for surgery, with the diagnosis of BILATERAL INGUINAL HERNIAS  The various methods of treatment have been discussed with the patient and family. After consideration of risks, benefits and other options for treatment, the patient has consented to  Procedure(s): BILATERAL INGUINAL HERNIA REPAIRS (Bilateral) INSERTION OF MESH (Bilateral) as a surgical intervention .  The patient's history has been reviewed, patient examined, no change in status, stable for surgery.  I have reviewed the patient's chart and labs.  Questions were answered to the patient's satisfaction.     Natahsa Marian A

## 2018-01-17 ENCOUNTER — Encounter: Payer: Self-pay | Admitting: Internal Medicine

## 2018-01-17 MED ORDER — POTASSIUM CHLORIDE ER 10 MEQ PO TBCR: 10.0000 meq | EXTENDED_RELEASE_TABLET | Freq: Two times a day (BID) | ORAL | 11 refills | Status: DC

## 2018-01-30 DIAGNOSIS — F10188 Alcohol abuse with other alcohol-induced disorder: Secondary | ICD-10-CM | POA: Diagnosis not present

## 2018-01-30 DIAGNOSIS — G9340 Encephalopathy, unspecified: Secondary | ICD-10-CM | POA: Diagnosis not present

## 2018-01-30 DIAGNOSIS — K4090 Unilateral inguinal hernia, without obstruction or gangrene, not specified as recurrent: Secondary | ICD-10-CM | POA: Diagnosis not present

## 2018-01-30 DIAGNOSIS — Z87891 Personal history of nicotine dependence: Secondary | ICD-10-CM | POA: Diagnosis not present

## 2018-01-30 DIAGNOSIS — R933 Abnormal findings on diagnostic imaging of other parts of digestive tract: Secondary | ICD-10-CM | POA: Diagnosis not present

## 2018-01-30 DIAGNOSIS — Z9889 Other specified postprocedural states: Secondary | ICD-10-CM | POA: Diagnosis not present

## 2018-01-30 DIAGNOSIS — R69 Illness, unspecified: Secondary | ICD-10-CM | POA: Diagnosis not present

## 2018-01-30 DIAGNOSIS — Z8505 Personal history of malignant neoplasm of liver: Secondary | ICD-10-CM | POA: Diagnosis not present

## 2018-01-30 DIAGNOSIS — Z79899 Other long term (current) drug therapy: Secondary | ICD-10-CM | POA: Diagnosis not present

## 2018-01-30 DIAGNOSIS — K769 Liver disease, unspecified: Secondary | ICD-10-CM | POA: Diagnosis not present

## 2018-03-12 ENCOUNTER — Other Ambulatory Visit: Payer: Self-pay | Admitting: Internal Medicine

## 2018-03-13 DIAGNOSIS — K766 Portal hypertension: Secondary | ICD-10-CM | POA: Diagnosis not present

## 2018-03-13 DIAGNOSIS — R188 Other ascites: Secondary | ICD-10-CM | POA: Diagnosis not present

## 2018-03-13 DIAGNOSIS — R69 Illness, unspecified: Secondary | ICD-10-CM | POA: Diagnosis not present

## 2018-03-13 DIAGNOSIS — Z6824 Body mass index (BMI) 24.0-24.9, adult: Secondary | ICD-10-CM | POA: Diagnosis not present

## 2018-03-13 DIAGNOSIS — K7460 Unspecified cirrhosis of liver: Secondary | ICD-10-CM | POA: Diagnosis not present

## 2018-03-13 DIAGNOSIS — C220 Liver cell carcinoma: Secondary | ICD-10-CM | POA: Diagnosis not present

## 2018-03-13 DIAGNOSIS — M7989 Other specified soft tissue disorders: Secondary | ICD-10-CM | POA: Diagnosis not present

## 2018-03-13 DIAGNOSIS — R233 Spontaneous ecchymoses: Secondary | ICD-10-CM | POA: Diagnosis not present

## 2018-04-12 DIAGNOSIS — K766 Portal hypertension: Secondary | ICD-10-CM | POA: Diagnosis not present

## 2018-04-12 DIAGNOSIS — I8500 Esophageal varices without bleeding: Secondary | ICD-10-CM | POA: Diagnosis not present

## 2018-04-12 DIAGNOSIS — K3189 Other diseases of stomach and duodenum: Secondary | ICD-10-CM | POA: Diagnosis not present

## 2018-04-12 DIAGNOSIS — Z87891 Personal history of nicotine dependence: Secondary | ICD-10-CM | POA: Diagnosis not present

## 2018-04-12 DIAGNOSIS — K7460 Unspecified cirrhosis of liver: Secondary | ICD-10-CM | POA: Diagnosis not present

## 2018-06-18 DIAGNOSIS — J069 Acute upper respiratory infection, unspecified: Secondary | ICD-10-CM | POA: Diagnosis not present

## 2018-07-18 DIAGNOSIS — K429 Umbilical hernia without obstruction or gangrene: Secondary | ICD-10-CM | POA: Diagnosis not present

## 2018-07-18 DIAGNOSIS — N433 Hydrocele, unspecified: Secondary | ICD-10-CM | POA: Diagnosis not present

## 2018-07-18 DIAGNOSIS — R69 Illness, unspecified: Secondary | ICD-10-CM | POA: Diagnosis not present

## 2018-07-19 DIAGNOSIS — N433 Hydrocele, unspecified: Secondary | ICD-10-CM | POA: Insufficient documentation

## 2018-07-19 DIAGNOSIS — K429 Umbilical hernia without obstruction or gangrene: Secondary | ICD-10-CM | POA: Insufficient documentation

## 2018-07-19 DIAGNOSIS — K7031 Alcoholic cirrhosis of liver with ascites: Secondary | ICD-10-CM | POA: Insufficient documentation

## 2018-08-03 IMAGING — CR DG WRIST COMPLETE 3+V*R*
4 series · 4 of 4 positions shown · non-contrast
Comparison: None.

CLINICAL DATA: Fell today and injured right wrist.

EXAM:
RIGHT WRIST - COMPLETE 3+ VIEW

[wrist pa]
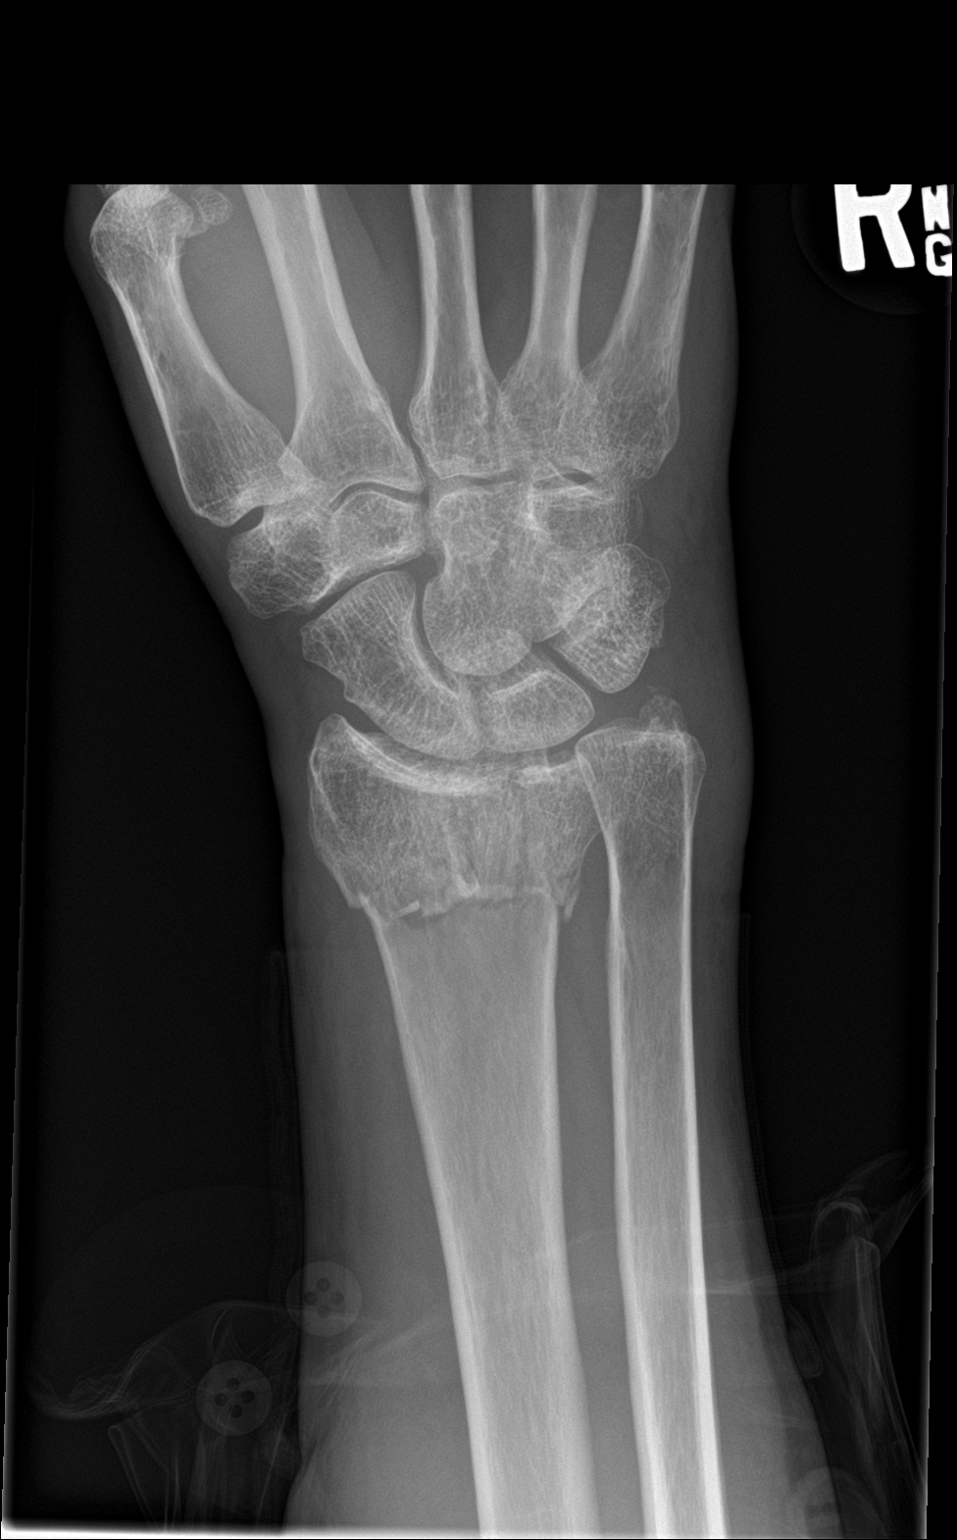

[wrist obl]
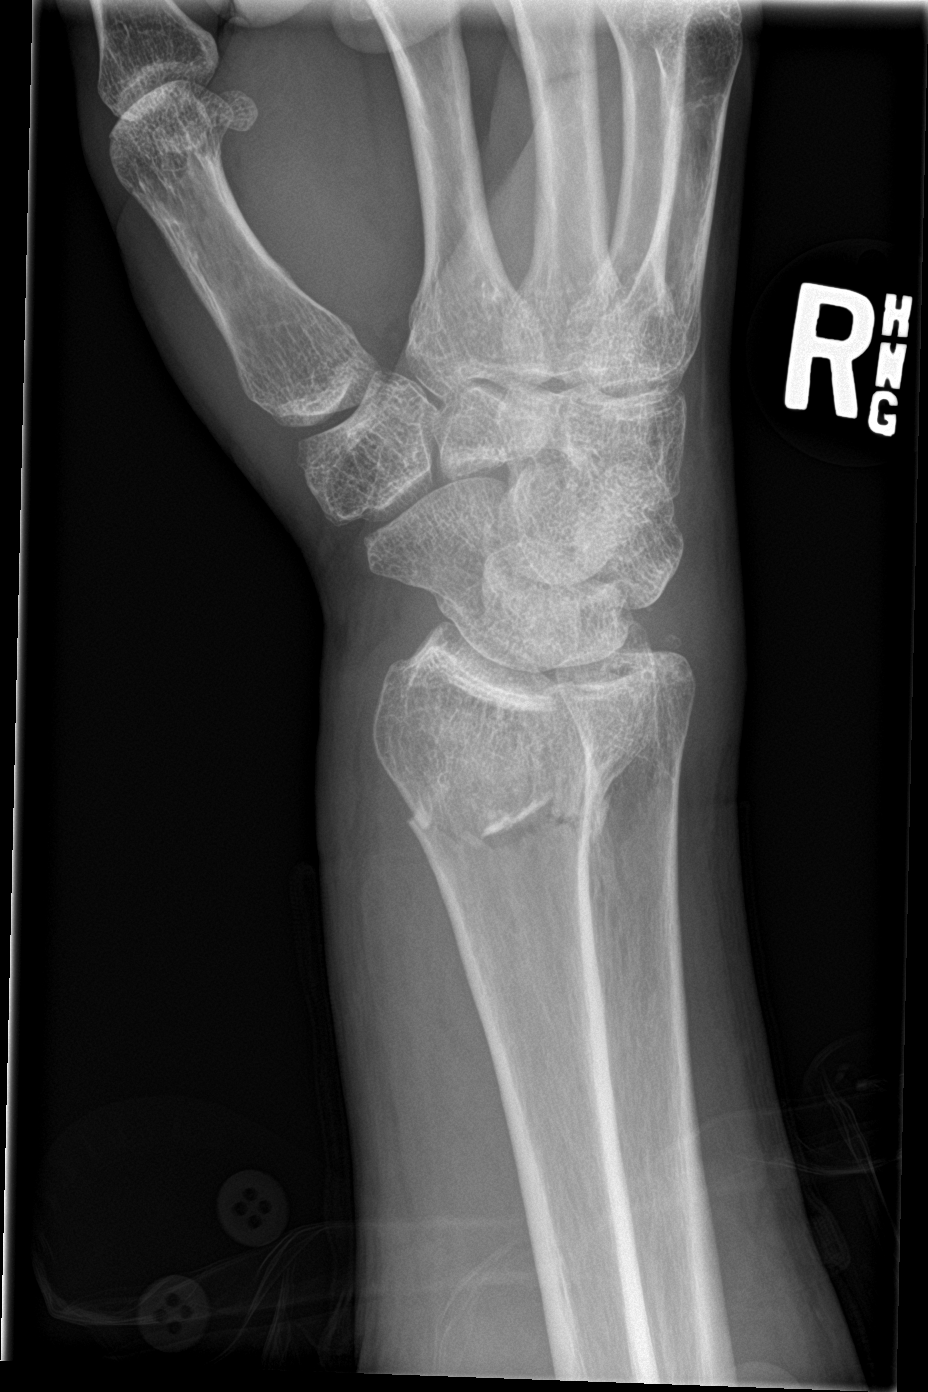

[wrist lat]
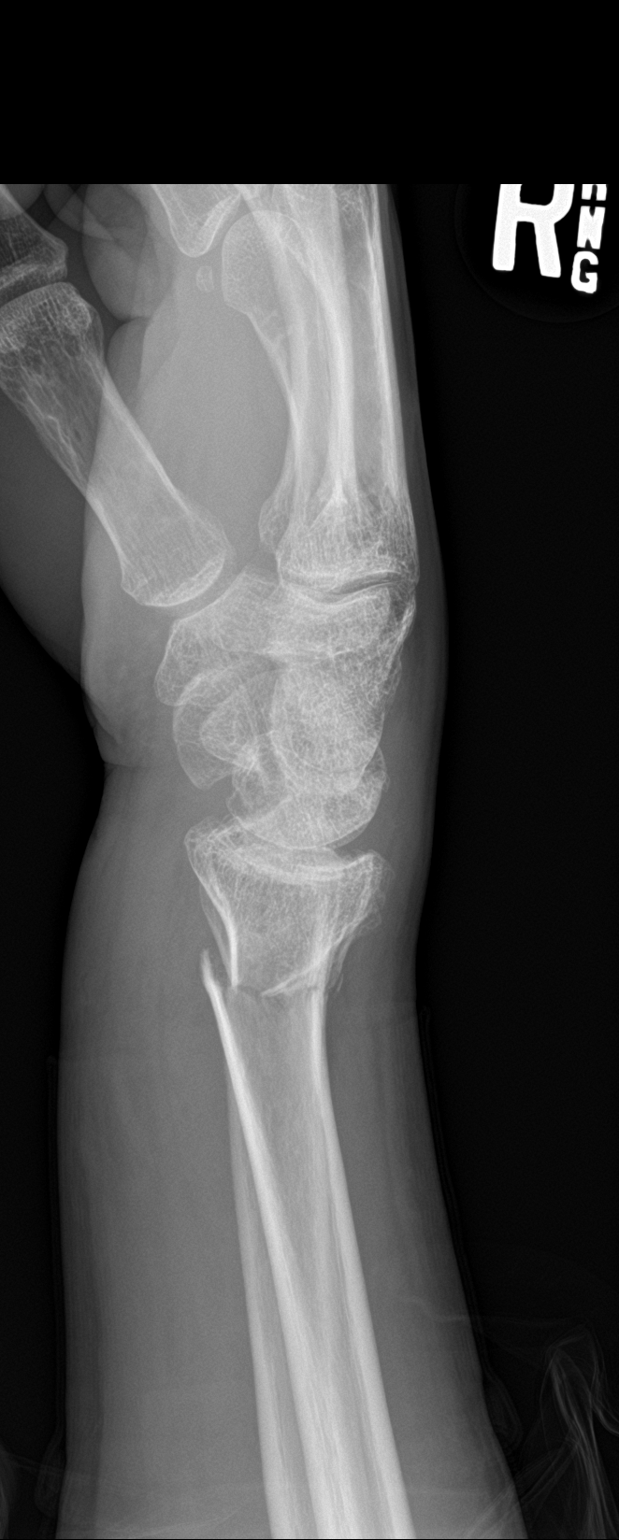

[wrist navicular]
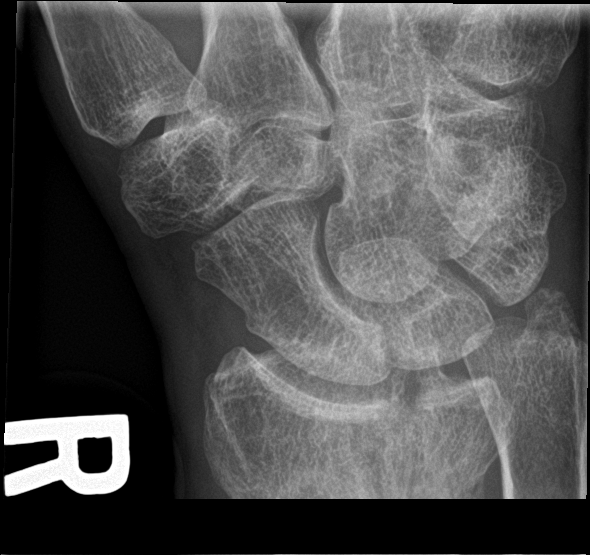

[4 of 4 positions shown; findings below may reference images not displayed]

FINDINGS: There is a comminuted intra-articular dorsally impacted distal
radius fracture. Possible tiny ulnar styloid avulsion fracture. The
carpal and metacarpal bones are intact.
IMPRESSION: Comminuted, intra-articular, dorsally impacted distal radius
fracture.

## 2018-08-05 ENCOUNTER — Encounter: Payer: Self-pay | Admitting: Internal Medicine

## 2018-08-05 DIAGNOSIS — K4090 Unilateral inguinal hernia, without obstruction or gangrene, not specified as recurrent: Secondary | ICD-10-CM

## 2018-08-05 NOTE — Telephone Encounter (Signed)
Referral done  Staff to contact pt to schedule Nurse Visit appt for flu shot please

## 2018-09-13 DIAGNOSIS — Z23 Encounter for immunization: Secondary | ICD-10-CM | POA: Diagnosis not present

## 2018-09-13 DIAGNOSIS — Z7901 Long term (current) use of anticoagulants: Secondary | ICD-10-CM | POA: Diagnosis not present

## 2018-09-13 DIAGNOSIS — K4090 Unilateral inguinal hernia, without obstruction or gangrene, not specified as recurrent: Secondary | ICD-10-CM | POA: Diagnosis not present

## 2018-09-13 DIAGNOSIS — R0602 Shortness of breath: Secondary | ICD-10-CM | POA: Diagnosis not present

## 2018-09-13 DIAGNOSIS — C220 Liver cell carcinoma: Secondary | ICD-10-CM | POA: Diagnosis not present

## 2018-09-13 DIAGNOSIS — Z87891 Personal history of nicotine dependence: Secondary | ICD-10-CM | POA: Diagnosis not present

## 2018-09-13 DIAGNOSIS — R001 Bradycardia, unspecified: Secondary | ICD-10-CM | POA: Diagnosis not present

## 2018-09-13 DIAGNOSIS — Z7409 Other reduced mobility: Secondary | ICD-10-CM | POA: Diagnosis not present

## 2018-09-13 DIAGNOSIS — Z79899 Other long term (current) drug therapy: Secondary | ICD-10-CM | POA: Diagnosis not present

## 2018-09-13 DIAGNOSIS — Z8505 Personal history of malignant neoplasm of liver: Secondary | ICD-10-CM | POA: Diagnosis not present

## 2018-09-13 DIAGNOSIS — K7460 Unspecified cirrhosis of liver: Secondary | ICD-10-CM | POA: Diagnosis not present

## 2018-09-13 DIAGNOSIS — D649 Anemia, unspecified: Secondary | ICD-10-CM | POA: Diagnosis not present

## 2018-09-13 DIAGNOSIS — R932 Abnormal findings on diagnostic imaging of liver and biliary tract: Secondary | ICD-10-CM | POA: Diagnosis not present

## 2018-09-13 DIAGNOSIS — I81 Portal vein thrombosis: Secondary | ICD-10-CM | POA: Diagnosis not present

## 2018-09-13 DIAGNOSIS — R188 Other ascites: Secondary | ICD-10-CM | POA: Diagnosis not present

## 2018-09-14 DIAGNOSIS — R600 Localized edema: Secondary | ICD-10-CM | POA: Diagnosis not present

## 2018-09-14 DIAGNOSIS — R188 Other ascites: Secondary | ICD-10-CM | POA: Diagnosis not present

## 2018-09-14 DIAGNOSIS — K7469 Other cirrhosis of liver: Secondary | ICD-10-CM | POA: Diagnosis not present

## 2018-09-14 MED ORDER — FUROSEMIDE 80 MG PO TABS
80.00 | ORAL_TABLET | ORAL | Status: DC
Start: ? — End: 2018-09-14

## 2018-09-14 MED ORDER — ENOXAPARIN SODIUM 30 MG/0.3ML ~~LOC~~ SOLN
30.00 | SUBCUTANEOUS | Status: DC
Start: 2018-09-14 — End: 2018-09-14

## 2018-09-14 MED ORDER — LEVETIRACETAM 500 MG PO TABS
1000.00 | ORAL_TABLET | ORAL | Status: DC
Start: 2018-09-14 — End: 2018-09-14

## 2018-09-14 MED ORDER — SPIRONOLACTONE 100 MG PO TABS
200.00 | ORAL_TABLET | ORAL | Status: DC
Start: ? — End: 2018-09-14

## 2018-09-18 ENCOUNTER — Other Ambulatory Visit: Payer: Self-pay | Admitting: Internal Medicine

## 2018-09-18 MED ORDER — LEVETIRACETAM 1000 MG PO TABS: 1000.0000 mg | ORAL_TABLET | Freq: Two times a day (BID) | ORAL | 1 refills | Status: DC

## 2018-09-18 NOTE — Telephone Encounter (Signed)
Done erx 

## 2018-09-24 ENCOUNTER — Encounter: Payer: Self-pay | Admitting: Internal Medicine

## 2018-09-24 ENCOUNTER — Ambulatory Visit (INDEPENDENT_AMBULATORY_CARE_PROVIDER_SITE_OTHER): Payer: Medicare Other | Admitting: Internal Medicine

## 2018-09-24 VITALS — BP 132/86 | HR 89 | Temp 98.0°F | Ht 72.0 in | Wt 192.0 lb

## 2018-09-24 DIAGNOSIS — R7302 Impaired glucose tolerance (oral): Secondary | ICD-10-CM

## 2018-09-24 DIAGNOSIS — Z0000 Encounter for general adult medical examination without abnormal findings: Secondary | ICD-10-CM

## 2018-09-24 MED ORDER — POTASSIUM CHLORIDE ER 10 MEQ PO TBCR: 20.0000 meq | EXTENDED_RELEASE_TABLET | Freq: Two times a day (BID) | ORAL | 11 refills | Status: DC

## 2018-09-24 MED ORDER — FUROSEMIDE 40 MG PO TABS: 80.0000 mg | ORAL_TABLET | Freq: Two times a day (BID) | ORAL | 11 refills | Status: DC

## 2018-09-24 NOTE — Assessment & Plan Note (Signed)

## 2018-09-24 NOTE — Progress Notes (Signed)
Subjective:    Patient ID: [name], male    DOB: 1965-11-09, 53 y.o.   MRN: [id number]  HPI  Here for wellness and f/u;  Overall doing ok;  Pt denies Chest pain, worsening SOB, DOE, wheezing, orthopnea, PND, worsening LE edema, palpitations, dizziness or syncope.  Pt denies neurological change such as new headache, facial or extremity weakness.  Pt denies polydipsia, polyuria, or low sugar symptoms. Pt states overall good compliance with treatment and medications, good tolerability, and has been trying to follow appropriate diet.  Pt denies worsening depressive symptoms, suicidal ideation or panic. No fever, night sweats, wt loss, loss of appetite, or other constitutional symptoms.  Pt states good ability with ADL's, has low fall risk, home safety reviewed and adequate, no other significant changes in hearing or vision, and only occasionally active with exercise.  More stress and feelin down lately with breakup girlfriend.  Had MRI to check for malignancy last wk neg per [facility], but did find ascites and had paracentesis 4L.  Overall feels improved.  Lasix and and aldactone were increased to current noted, and K incresaed as well.  Still about 12 lbs over "dry" wt per pt.  To see GI later this mo, thinks he will need EGD and colonoscopy soon.  No other new complaints Past Medical History:  Diagnosis Date  . Abuse, drug or alcohol ([place])   . Anemia   . Bipolar affective disorder ([place])    [place]  . Cancer [facility])    liver cancer treated with micrablation  . Chronic liver disease and cirrhosis   . Depression   . DJD (degenerative joint disease)    right knee  . Dyslipidemia   . Encephalopathy   . Esophageal varices ([place])   . Hernia   . History of alcohol abuse   . PONV (postoperative nausea and vomiting)   . Seizures ([place])    Dr Jannifer Franklin  . Thrombocytopenia ([place])   . Wilson disease   . Wrist fracture    right   Past Surgical History:  Procedure  Laterality Date  . ESOPHAGOGASTRODUODENOSCOPY    . HARDWARE REMOVAL Right 01/22/2015   Procedure: HARDWARE REMOVAL RIGHT LEG;  Surgeon: Leandrew Koyanagi, MD;  Location: [place];  Service: Orthopedics;  Laterality: Right;  . left inguinal hernia  june 2012   [place]  . liver cancer  02/2015   ablation  . microablation of liver    . OPEN REDUCTION INTERNAL FIXATION (ORIF) DISTAL RADIAL FRACTURE Right 12/21/2016   Procedure: OPEN REDUCTION INTERNAL FIXATION (ORIF) RIGHT DISTAL RADIUS FRACTURE;  Surgeon: Leandrew Koyanagi, MD;  Location: [place];  Service: Orthopedics;  Laterality: Right;  . OPEN REDUCTION INTERNAL FIXATION (ORIF) TIBIA/FIBULA FRACTURE Right 07/25/2013   Procedure: OPEN REDUCTION INTERNAL FIXATION (ORIF) RIGHT TIBIAL PLATEAU, TIBIAL SHAFT AND FIBULA FRACTURES, POSSIBLE FASCIOTOMIES;  Surgeon: Marianna Payment, MD;  Location: [place];  Service: Orthopedics;  Laterality: Right;  . TOTAL KNEE ARTHROPLASTY Right 07/15/2015   Procedure: RIGHT TOTAL KNEE ARTHROPLASTY;  Surgeon: Leandrew Koyanagi, MD;  Location: [place];  Service: Orthopedics;  Laterality: Right;  [name] VASCULAR SURGERY      reports that he has been smoking cigarettes. He has been smoking about 0.25 packs per day. He has never used smokeless tobacco. He reports that he does not drink alcohol or use drugs. family history includes Dementia in his mother; Diabetes in his father; Glaucoma in his brother; Heart disease  in an other family member. Allergies  Allergen Reactions  . Codeine Nausea And Vomiting    Pt unsure if this is correct.   Current Outpatient Medications on File Prior to Visit  Medication Sig Dispense Refill  . cholecalciferol (VITAMIN D) 1000 units tablet Take 1,000 Units by mouth daily.    [name] levETIRAcetam (KEPPRA) 1000 MG tablet Take 1 tablet (1,000 mg total) by mouth 2 (two) times daily. 180 tablet 3  . spironolactone (ALDACTONE) 100 MG tablet Take 200 mg by mouth every morning.     . zinc gluconate 50 MG tablet Take 50  mg by mouth 3 (three) times daily.      No current facility-administered medications on file prior to visit.    Review of Systems Constitutional: Negative for other unusual diaphoresis, sweats, appetite or weight changes HENT: Negative for other worsening hearing loss, ear pain, facial swelling, mouth sores or neck stiffness.   Eyes: Negative for other worsening pain, redness or other visual disturbance.  Respiratory: Negative for other stridor or swelling Cardiovascular: Negative for other palpitations or other chest pain  Gastrointestinal: Negative for worsening diarrhea or loose stools, blood in stool, distention or other pain Genitourinary: Negative for hematuria, flank pain or other change in urine volume.  Musculoskeletal: Negative for myalgias or other joint swelling.  Skin: Negative for other color change, or other wound or worsening drainage.  Neurological: Negative for other syncope or numbness. Hematological: Negative for other adenopathy or swelling Psychiatric/Behavioral: Negative for hallucinations, other worsening agitation, SI, self-injury, or new decreased concentration All other system neg per pt    Objective:   Physical Exam VS noted, juandiced Constitutional: Pt is oriented to person, place, and time. Appears well-developed and well-nourished, in no significant distress and comfortable Head: Normocephalic and atraumatic  Eyes: Conjunctivae and EOM are normal. Pupils are equal, round, and reactive to light Right Ear: External ear normal without discharge Left Ear: External ear normal without discharge Nose: Nose without discharge or deformity Mouth/Throat: Oropharynx is without other ulcerations and moist  Neck: Normal range of motion. Neck supple. No JVD present. No tracheal deviation present or significant neck LA or mass Cardiovascular: Normal rate, regular rhythm, normal heart sounds and intact distal pulses.   Pulmonary/Chest: WOB normal and breath sounds  without rales or wheezing  Abdominal: Soft. Bowel sounds are normal. NT. No HSM , + scites Musculoskeletal: Normal range of motion. Exhibits no edema Lymphadenopathy: Has no other cervical adenopathy.  Neurological: Pt is alert and oriented to person, place, and time. Pt has normal reflexes. No cranial nerve deficit. Motor grossly intact, Gait intact Skin: Skin is warm and dry. No rash noted or new ulcerations Psychiatric:  Has normal mood and affect. Behavior is normal without agitation No other exam findings  Lab Results  Component Value Date   WBC 4.9 01/15/2018   HGB 13.8 01/15/2018   HCT 42.3 01/15/2018   PLT 48 (L) 01/15/2018   GLUCOSE 85 01/15/2018   CHOL 141 06/07/2016   TRIG 86.0 06/07/2016   HDL 42.70 06/07/2016   LDLDIRECT 117.8 01/07/2013   LDLCALC 81 06/07/2016   ALT 42 01/15/2018   AST 39 01/15/2018   NA 136 01/15/2018   K 3.7 01/15/2018   CL 107 01/15/2018   CREATININE 0.73 01/15/2018   BUN 14 01/15/2018   CO2 22 01/15/2018   TSH 1.01 06/07/2016   PSA 0.11 06/07/2016   INR 1.49 01/15/2018   HGBA1C 4.4 (L) 01/07/2013   Declines other labs today  Assessment & Plan:

## 2018-09-24 NOTE — Patient Instructions (Signed)
Please continue all other medications as before, and refills have been done if requested.  Please have the pharmacy call with any other refills you may need.  Please continue your efforts at being more active, low cholesterol diet, and weight control.  You are otherwise up to date with prevention measures today.  Please keep your appointments with your specialists as you may have planned  Please return in 6 months, or sooner if needed 

## 2018-09-24 NOTE — Assessment & Plan Note (Signed)
stable overall by history and exam, recent data reviewed with pt, and pt to continue medical treatment as before,  to f/u any worsening symptoms or concerns  

## 2018-09-25 ENCOUNTER — Ambulatory Visit: Payer: Medicare HMO | Admitting: Internal Medicine

## 2018-10-02 DIAGNOSIS — R0602 Shortness of breath: Secondary | ICD-10-CM | POA: Diagnosis not present

## 2018-10-02 DIAGNOSIS — K7460 Unspecified cirrhosis of liver: Secondary | ICD-10-CM | POA: Diagnosis not present

## 2018-10-04 ENCOUNTER — Emergency Department (HOSPITAL_COMMUNITY)
Admission: EM | Admit: 2018-10-04 | Discharge: 2018-10-04 | Disposition: A | Discharge status: Home / Self Care | Payer: Medicare Other | Attending: Emergency Medicine | Admitting: Emergency Medicine

## 2018-10-04 ENCOUNTER — Emergency Department (HOSPITAL_COMMUNITY): Payer: Medicare Other

## 2018-10-04 ENCOUNTER — Encounter (HOSPITAL_COMMUNITY): Payer: Self-pay | Admitting: Emergency Medicine

## 2018-10-04 ENCOUNTER — Other Ambulatory Visit: Payer: Self-pay

## 2018-10-04 DIAGNOSIS — Z79899 Other long term (current) drug therapy: Secondary | ICD-10-CM | POA: Diagnosis not present

## 2018-10-04 DIAGNOSIS — F17210 Nicotine dependence, cigarettes, uncomplicated: Secondary | ICD-10-CM | POA: Diagnosis not present

## 2018-10-04 DIAGNOSIS — R188 Other ascites: Secondary | ICD-10-CM | POA: Diagnosis not present

## 2018-10-04 DIAGNOSIS — R0602 Shortness of breath: Secondary | ICD-10-CM | POA: Diagnosis not present

## 2018-10-04 DIAGNOSIS — R109 Unspecified abdominal pain: Secondary | ICD-10-CM | POA: Diagnosis present

## 2018-10-04 DIAGNOSIS — R52 Pain, unspecified: Secondary | ICD-10-CM | POA: Diagnosis not present

## 2018-10-04 HISTORY — PX: IR PARACENTESIS: IMG2679

## 2018-10-04 LAB — CBC WITH DIFFERENTIAL/PLATELET
Abs Immature Granulocytes: 0.02 10*3/uL (ref 0.00–0.07)
Basophils Absolute: 0.1 10*3/uL (ref 0.0–0.1)
Basophils Relative: 1 %
Eosinophils Absolute: 0.2 10*3/uL (ref 0.0–0.5)
Eosinophils Relative: 3 %
HCT: 37.6 % — ABNORMAL LOW (ref 39.0–52.0)
Hemoglobin: 11.9 g/dL — ABNORMAL LOW (ref 13.0–17.0)
Immature Granulocytes: 0 %
Lymphocytes Relative: 17 %
Lymphs Abs: 0.8 10*3/uL (ref 0.7–4.0)
MCH: 33.8 pg (ref 26.0–34.0)
MCHC: 31.6 g/dL (ref 30.0–36.0)
MCV: 106.8 fL — ABNORMAL HIGH (ref 80.0–100.0)
Monocytes Absolute: 0.7 10*3/uL (ref 0.1–1.0)
Monocytes Relative: 15 %
Neutro Abs: 3.3 10*3/uL (ref 1.7–7.7)
Neutrophils Relative %: 64 %
Platelets: DECREASED 10*3/uL (ref 150–400)
RBC: 3.52 MIL/uL — ABNORMAL LOW (ref 4.22–5.81)
RDW: 17.9 % — ABNORMAL HIGH (ref 11.5–15.5)
WBC: 5.1 10*3/uL (ref 4.0–10.5)
nRBC: 0 % (ref 0.0–0.2)

## 2018-10-04 LAB — COMPREHENSIVE METABOLIC PANEL
ALT: 73 U/L — ABNORMAL HIGH (ref 0–44)
AST: 105 U/L — ABNORMAL HIGH (ref 15–41)
Albumin: 2.2 g/dL — ABNORMAL LOW (ref 3.5–5.0)
Alkaline Phosphatase: 172 U/L — ABNORMAL HIGH (ref 38–126)
Anion gap: 9 (ref 5–15)
BUN: 14 mg/dL (ref 6–20)
CO2: 21 mmol/L — ABNORMAL LOW (ref 22–32)
Calcium: 9.6 mg/dL (ref 8.9–10.3)
Chloride: 103 mmol/L (ref 98–111)
Creatinine, Ser: 0.83 mg/dL (ref 0.61–1.24)
GFR calc Af Amer: >60 mL/min (ref >60)
GFR calc non Af Amer: >60 mL/min (ref >60)
Glucose, Bld: 106 mg/dL — ABNORMAL HIGH (ref 70–99)
Potassium: 4.6 mmol/L (ref 3.5–5.1)
Sodium: 133 mmol/L — ABNORMAL LOW (ref 135–145)
Total Bilirubin: 2.6 mg/dL — ABNORMAL HIGH (ref 0.3–1.2)
Total Protein: 6.3 g/dL — ABNORMAL LOW (ref 6.5–8.1)

## 2018-10-04 LAB — GRAM STAIN

## 2018-10-04 LAB — GLUCOSE, PLEURAL OR PERITONEAL FLUID: Glucose, Fluid: 99 mg/dL

## 2018-10-04 LAB — BODY FLUID CELL COUNT WITH DIFFERENTIAL
Eos, Fluid: 0 %
Lymphs, Fluid: 69 %
Monocyte-Macrophage-Serous Fluid: 19 % — ABNORMAL LOW (ref 50–90)
Neutrophil Count, Fluid: 12 % (ref 0–25)
Total Nucleated Cell Count, Fluid: 183 cu mm (ref 0–1000)

## 2018-10-04 LAB — CBG MONITORING, ED: Glucose-Capillary: 95 mg/dL (ref 70–99)

## 2018-10-04 LAB — ALBUMIN, PLEURAL OR PERITONEAL FLUID: Albumin, Fluid: <1 g/dL

## 2018-10-04 LAB — LACTATE DEHYDROGENASE, PLEURAL OR PERITONEAL FLUID: LD, Fluid: 67 U/L — ABNORMAL HIGH (ref 3–23)

## 2018-10-04 LAB — PROTEIN, PLEURAL OR PERITONEAL FLUID: Total protein, fluid: <3 g/dL

## 2018-10-04 LAB — AMYLASE, PLEURAL OR PERITONEAL FLUID: Amylase, Fluid: 19 U/L

## 2018-10-04 MED ORDER — ONDANSETRON HCL 4 MG/2ML IJ SOLN
4.0000 mg | Freq: Once | INTRAMUSCULAR | Status: AC
  Administered 2018-10-04: 4 mg via INTRAVENOUS
  Filled 2018-10-04: qty 2

## 2018-10-04 MED ORDER — FENTANYL CITRATE (PF) 100 MCG/2ML IJ SOLN
100.0000 ug | Freq: Once | INTRAMUSCULAR | Status: AC
  Administered 2018-10-04: 100 ug via INTRAVENOUS
  Filled 2018-10-04: qty 2

## 2018-10-04 MED ORDER — LIDOCAINE HCL 1 % IJ SOLN
INTRAMUSCULAR | Status: AC | PRN
  Administered 2018-10-04: 10 mL

## 2018-10-04 MED ORDER — LIDOCAINE HCL 1 % IJ SOLN
INTRAMUSCULAR | Status: AC
  Filled 2018-10-04: qty 20

## 2018-10-04 NOTE — ED Provider Notes (Signed)
[facility] EMERGENCY DEPARTMENT Provider Note   CSN: [id number] Arrival date & time: 10/04/18  4742     History   Chief Complaint Chief Complaint  Patient presents with  . Ascites  . Abdominal Pain    HPI [name] is a 53 y.o. male.  Patient with history of Wilson disease, liver cirrhosis, followed at [facility] -- presents with c/o worsening abdominal swelling and pain in the abdomen. States he had a paracentesis done about 2 weeks ago with approximately 4 L removed.  Patient states that over the past week he began reaccumulating fluid.  It became acutely worse in the past 12 hours.  Patient reports lower abdominal pain and scrotal swelling.  He is able to urinate but states that he must do so while sitting in a bathtub.  He has some shortness of breath but no chest pain.  No fevers, nausea, vomiting, or diarrhea.  No treatments prior to arrival.  He has not contacted his [facility] MDs. The onset of this condition was acute. The course is constant. Aggravating factors: none. Alleviating factors: none.       Past Medical History:  Diagnosis Date  . Abuse, drug or alcohol ([place])   . Anemia   . Bipolar affective disorder ([place])    [place]  . Cancer [facility])    liver cancer treated with micrablation  . Chronic liver disease and cirrhosis   . Depression   . DJD (degenerative joint disease)    right knee  . Dyslipidemia   . Encephalopathy   . Esophageal varices ([place])   . Hernia   . History of alcohol abuse   . PONV (postoperative nausea and vomiting)   . Seizures ([place])    Dr Jannifer Franklin  . Thrombocytopenia ([place])   . Wilson disease   . Wrist fracture    right    Patient Active Problem List   Diagnosis Date Noted  . Displaced fracture of distal end of right radius 12/23/2016  . Seizure ([place]) 10/29/2016  . Possible exposure to STD 06/07/2016  . S/P total knee replacement using cement 07/15/2015  . Chest pain 04/30/2015  . Left inguinal  hernia 10/22/2014  . Hyponatremia 05/06/2014  . Diarrhea 04/30/2014  . UTI (urinary tract infection) 07/29/2013  . Tibial plateau fracture, tibial shaft fracture 07/24/2013  . Erectile dysfunction 01/07/2013  . Hypokalemia 10/21/2011  . Eczema 10/21/2011  . Impaired glucose tolerance 10/19/2011  . Hepatic encephalopathy ([place]) 08/05/2011  . Depression   . Seizures ([place])   . Wilson disease   . Encephalopathy   . Thrombocytopenia ([place])   . Chronic liver disease and cirrhosis ([place])   . History of alcohol abuse   . Preventative health care 04/14/2011  . Bipolar affective disorder [facility])     Past Surgical History:  Procedure Laterality Date  . ESOPHAGOGASTRODUODENOSCOPY    . HARDWARE REMOVAL Right 01/22/2015   Procedure: HARDWARE REMOVAL RIGHT LEG;  Surgeon: Leandrew Koyanagi, MD;  Location: [place];  Service: Orthopedics;  Laterality: Right;  . left inguinal hernia  june 2012   [place]  . liver cancer  02/2015   ablation  . microablation of liver    . OPEN REDUCTION INTERNAL FIXATION (ORIF) DISTAL RADIAL FRACTURE Right 12/21/2016   Procedure: OPEN REDUCTION INTERNAL FIXATION (ORIF) RIGHT DISTAL RADIUS FRACTURE;  Surgeon: Leandrew Koyanagi, MD;  Location: [place];  Service: Orthopedics;  Laterality: Right;  . OPEN REDUCTION INTERNAL FIXATION (ORIF) TIBIA/FIBULA  FRACTURE Right 07/25/2013   Procedure: OPEN REDUCTION INTERNAL FIXATION (ORIF) RIGHT TIBIAL PLATEAU, TIBIAL SHAFT AND FIBULA FRACTURES, POSSIBLE FASCIOTOMIES;  Surgeon: Marianna Payment, MD;  Location: [place];  Service: Orthopedics;  Laterality: Right;  . TOTAL KNEE ARTHROPLASTY Right 07/15/2015   Procedure: RIGHT TOTAL KNEE ARTHROPLASTY;  Surgeon: Leandrew Koyanagi, MD;  Location: [place];  Service: Orthopedics;  Laterality: Right;  [name] VASCULAR SURGERY          Home Medications    Prior to Admission medications   Medication Sig Start Date End Date Taking? Authorizing Provider  cholecalciferol (VITAMIN D) 1000 units tablet Take 1,000  Units by mouth daily.   Yes [provider]  furosemide (LASIX) 40 MG tablet Take 2 tablets (80 mg total) by mouth 2 (two) times daily. 09/24/18  Yes Biagio Borg, MD  levETIRAcetam (KEPPRA) 1000 MG tablet Take 1 tablet (1,000 mg total) by mouth 2 (two) times daily. Patient taking differently: Take 1,500 mg by mouth 2 (two) times daily.  09/21/17  Yes Biagio Borg, MD  potassium chloride (K-DUR) 10 MEQ tablet Take 2 tablets (20 mEq total) by mouth 2 (two) times daily. 09/24/18  Yes Biagio Borg, MD  spironolactone (ALDACTONE) 100 MG tablet Take 200 mg by mouth every morning.  10/27/16  Yes [provider]  zinc gluconate 50 MG tablet Take 50 mg by mouth 3 (three) times daily.    Yes [provider]    Family History Family History  Problem Relation Age of Onset  . Heart disease Other   . Diabetes Father   . Dementia Mother   . Glaucoma Brother     Social History Social History   Tobacco Use  . Smoking status: Current Some Day Smoker    Packs/day: 0.25    Types: Cigarettes  . Smokeless tobacco: Never Used  Substance Use Topics  . Alcohol use: No    Alcohol/week: 0.0 standard drinks    Comment: 3-4 bottles a week, quit drinking 2005  . Drug use: No    Comment: last use 2005     Allergies   Codeine   Review of Systems Review of Systems  Constitutional: Negative for fever.  HENT: Negative for rhinorrhea and sore throat.   Eyes: Negative for redness.  Respiratory: Positive for shortness of breath. Negative for cough.   Cardiovascular: Negative for chest pain.  Gastrointestinal: Positive for abdominal distention and abdominal pain. Negative for diarrhea, nausea and vomiting.  Genitourinary: Positive for difficulty urinating and scrotal swelling. Negative for dysuria.  Musculoskeletal: Negative for myalgias.  Skin: Negative for rash.  Neurological: Negative for headaches.     Physical Exam Updated Vital Signs BP 130/69   Pulse 62   Temp  97.9 F (36.6 C) (Oral)   Resp (!) 28   Wt 87.9 kg   SpO2 97%   BMI 26.28 kg/m   Physical Exam Vitals signs and nursing note reviewed.  Constitutional:      Appearance: He is well-developed.  HENT:     Head: Normocephalic and atraumatic.  Eyes:     General:        Right eye: No discharge.        Left eye: No discharge.     Conjunctiva/sclera: Conjunctivae normal.  Neck:     Musculoskeletal: Normal range of motion and neck supple.  Cardiovascular:     Rate and Rhythm: Normal rate and regular rhythm.     Heart sounds: Normal heart sounds.  Pulmonary:     Effort: Pulmonary effort is normal.     Breath sounds: Normal breath sounds.  Abdominal:     General: There is distension.     Palpations: Abdomen is soft. There is fluid wave.     Tenderness: There is abdominal tenderness in the right lower quadrant, periumbilical area, suprapubic area and left lower quadrant.     Hernia: A hernia is present. Hernia is present in the umbilical area (soft, reducible).  Skin:    General: Skin is warm and dry.  Neurological:     Mental Status: He is alert.      ED Treatments / Results  Labs (all labs ordered are listed, but only abnormal results are displayed) Labs Reviewed  CBC WITH DIFFERENTIAL/PLATELET - Abnormal; Notable for the following components:      Result Value   RBC 3.52 (*)    Hemoglobin 11.9 (*)    HCT 37.6 (*)    MCV 106.8 (*)    RDW 17.9 (*)    All other components within normal limits  COMPREHENSIVE METABOLIC PANEL - Abnormal; Notable for the following components:   Sodium 133 (*)    CO2 21 (*)    Glucose, Bld 106 (*)    Total Protein 6.3 (*)    Albumin 2.2 (*)    AST 105 (*)    ALT 73 (*)    Alkaline Phosphatase 172 (*)    Total Bilirubin 2.6 (*)    All other components within normal limits  LACTATE DEHYDROGENASE, PLEURAL OR PERITONEAL FLUID - Abnormal; Notable for the following components:   LD, Fluid 67 (*)    All other components within normal limits    BODY FLUID CELL COUNT WITH DIFFERENTIAL - Abnormal; Notable for the following components:   Appearance, Fluid HAZY (*)    Monocyte-Macrophage-Serous Fluid 19 (*)    All other components within normal limits  GRAM STAIN  CULTURE, BODY FLUID-BOTTLE  ALBUMIN, PLEURAL OR PERITONEAL FLUID  PROTEIN, PLEURAL OR PERITONEAL FLUID  GLUCOSE, PLEURAL OR PERITONEAL FLUID  AMYLASE, PLEURAL OR PERITONEAL FLUID   CBG MONITORING, ED    EKG EKG Interpretation  Date/Time:  Friday October 04 2018 06:34:01 EST Ventricular Rate:  65 PR Interval:    QRS Duration: 84 QT Interval:  425 QTC Calculation: 442 R Axis:   21 Text Interpretation:  Sinus rhythm Posterior infarct, old Borderline T abnormalities, inferior leads Baseline wander in lead(s) V1 No STEMI Confirmed by Addison Lank [phone]) on 10/04/2018 6:58:30 AM   Radiology Ir Paracentesis  Result Date: 10/04/2018 INDICATION: Patient with ETOH cirrhosis who presented to [facility] ED today with complaints of abdominal distention and pain. Recent onset of ascites with previous 4.0 L paracentesis two weeks prior. Request for diagnostic and therapeutic paracentesis today. EXAM: ULTRASOUND GUIDED DIAGNOSTIC AND THERAPEUTIC PARACENTESIS MEDICATIONS: 10 mL 1% lidocaine. COMPLICATIONS: None immediate. PROCEDURE: Informed written consent was obtained from the patient after a discussion of the risks, benefits and alternatives to treatment. A timeout was performed prior to the initiation of the procedure. Initial ultrasound scanning demonstrates a large amount of ascites within the right lower abdominal quadrant. The right lower abdomen was prepped and draped in the usual sterile fashion. 1% lidocaine was used for local anesthesia. Following this, a 19 gauge, 7-cm, Yueh catheter was introduced. An ultrasound image was saved for documentation purposes. The paracentesis was performed. The catheter was removed and a dressing was applied. The patient tolerated the  procedure well without immediate post procedural  complication. FINDINGS: A total of approximately 5.0 L of hazy orange fluid was removed. Samples were sent to the laboratory as requested by the clinical team. IMPRESSION: Successful ultrasound-guided paracentesis yielding 5.0 liters of peritoneal fluid. Read by Candiss Norse, PA-C Electronically Signed   By: Marybelle Killings M.D.   On: 10/04/2018 10:37    Procedures Procedures (including critical care time)  Medications Ordered in ED Medications  lidocaine (XYLOCAINE) 1 % (with pres) injection (has no administration in time range)  fentaNYL (SUBLIMAZE) injection 100 mcg (100 mcg Intravenous Given 10/04/18 0802)  ondansetron (ZOFRAN) injection 4 mg (4 mg Intravenous Given 10/04/18 0802)  lidocaine (XYLOCAINE) 1 % (with pres) injection (10 mLs Infiltration Given 10/04/18 0929)     Initial Impression / Assessment and Plan / ED Course  I have reviewed the triage vital signs and the nursing notes.  Pertinent labs & imaging results that were available during my care of the patient were reviewed by me and considered in my medical decision making (see chart for details).     Patient seen and examined. Work-up initiated. Medications ordered.   Vital signs reviewed and are as follows: BP 130/69   Pulse 62   Temp 97.9 F (36.6 C) (Oral)   Resp (!) 28   Wt 87.9 kg   SpO2 97%   BMI 26.28 kg/m   Patient will need therapeutic paracentesis.  Given that his pain is worse today when compared to previous episode of abdominal distention, will check labs on the body fluid to ensure no signs of spontaneous bacterial peritonitis.  Although, exam and history overall is not very suggestive of SBP.  My hope is that he is going to clinically feel improved after the fluid is removed.  11:39 AM patient seen after return from paracentesis.  His symptoms are vastly improved.  States that abdominal pain is improved and breathing is easier.  Awaiting results of body  fluid testing.  1:54 PM Fluid testing reviewed with Dr. Vanita Panda. Pt ready for d/c to home. Pt to contact PCP and GI doctor to let them know about paracentesis today.  Encouraged return to the emergency department with worsening symptoms, fever, worsening abdominal pain.  Patient's pain remains improved at time of discharge.  No signs of hypotension or lightheadedness.  Patient has a ride home.  Final Clinical Impressions(s) / ED Diagnoses   Final diagnoses:  Abdominal pain   Patient with abdominal pain and shortness of breath likely due to massive ascites.  Blood counts are reassuring.  Symptoms essentially resolved after paracentesis.  Fluid testing does not demonstrate signs of spontaneous bacterial peritonitis.  Feel patient is stable for discharged home at this time in improved condition.  He will follow-up with his specialist.  He may need routine drainages on a scheduled basis.   ED Discharge Orders    None       Carlisle Cater, Hershal Coria 10/04/18 1356    Carmin Muskrat, MD 10/04/18 1547

## 2018-10-04 NOTE — ED Triage Notes (Signed)
Pt in from home with abdominal pain, ascites and sob. Hx of newly diagnosed liver disease, had 1st paracentesis 2 wks ago, 4L taken off. States increased pain and sob today, sats 96% on RA.

## 2018-10-04 NOTE — ED Notes (Signed)
Patient verbalizes understanding of discharge instructions. Opportunity for questioning and answers were provided. Armband removed by staff, pt discharged from ED. Pt wheeled to lobby. 

## 2018-10-04 NOTE — Procedures (Signed)
PROCEDURE SUMMARY:  Successful image-guided paracentesis from the right lower abdomen.  Yielded 5.0 liters of hazy orange fluid which was the requested maximum.  No immediate complications.  EBL: zero Patient tolerated well.   Specimen was sent for labs.  Please see imaging section of Epic for full dictation.  Joaquim Nam PA-C 10/04/2018 9:44 AM

## 2018-10-04 NOTE — Discharge Instructions (Signed)
Please read and follow all provided instructions.  Your diagnoses today include:  1. Other ascites   2. Abdominal pain     Tests performed today include:  Blood counts and electrolytes  Testing on abdominal fluid -does not suggest infection in the abdomen  Vital signs. See below for your results today.   Medications prescribed:   None  Take any prescribed medications only as directed.  Home care instructions:  Follow any educational materials contained in this packet.  Please follow-up with your primary care doctor and let sure liver specialist at [facility] know that you came in today for paracentesis.  Follow-up instructions: Please follow-up with your primary care provider in the next 7 days for further evaluation of your symptoms.   Return instructions:   Please return to the Emergency Department if you experience worsening symptoms.   Please return if you have any other emergent concerns.  Additional Information:  Your vital signs today were: BP (!) 111/59    Pulse 69    Temp 97.9 F (36.6 C) (Oral)    Resp (!) 28    Wt 87.9 kg    SpO2 93%    BMI 26.28 kg/m  If your blood pressure (BP) was elevated above 135/85 this visit, please have this repeated by your doctor within one month. --------------

## 2018-10-05 DIAGNOSIS — J9600 Acute respiratory failure, unspecified whether with hypoxia or hypercapnia: Secondary | ICD-10-CM | POA: Diagnosis not present

## 2018-10-05 DIAGNOSIS — K7460 Unspecified cirrhosis of liver: Secondary | ICD-10-CM | POA: Diagnosis not present

## 2018-10-05 DIAGNOSIS — K7290 Hepatic failure, unspecified without coma: Secondary | ICD-10-CM | POA: Diagnosis not present

## 2018-10-05 DIAGNOSIS — M47812 Spondylosis without myelopathy or radiculopathy, cervical region: Secondary | ICD-10-CM | POA: Diagnosis not present

## 2018-10-05 DIAGNOSIS — R531 Weakness: Secondary | ICD-10-CM | POA: Diagnosis not present

## 2018-10-05 DIAGNOSIS — K7200 Acute and subacute hepatic failure without coma: Secondary | ICD-10-CM | POA: Diagnosis not present

## 2018-10-05 DIAGNOSIS — Z66 Do not resuscitate: Secondary | ICD-10-CM | POA: Diagnosis not present

## 2018-10-05 DIAGNOSIS — G540 Brachial plexus disorders: Secondary | ICD-10-CM | POA: Diagnosis not present

## 2018-10-05 DIAGNOSIS — M4802 Spinal stenosis, cervical region: Secondary | ICD-10-CM | POA: Diagnosis not present

## 2018-10-05 DIAGNOSIS — J9811 Atelectasis: Secondary | ICD-10-CM | POA: Diagnosis not present

## 2018-10-05 DIAGNOSIS — R8569 Abnormal cytological findings in specimens from other digestive organs and abdominal cavity: Secondary | ICD-10-CM | POA: Diagnosis not present

## 2018-10-05 DIAGNOSIS — Z6824 Body mass index (BMI) 24.0-24.9, adult: Secondary | ICD-10-CM | POA: Diagnosis not present

## 2018-10-05 DIAGNOSIS — I959 Hypotension, unspecified: Secondary | ICD-10-CM | POA: Diagnosis not present

## 2018-10-05 DIAGNOSIS — E871 Hypo-osmolality and hyponatremia: Secondary | ICD-10-CM | POA: Diagnosis not present

## 2018-10-05 DIAGNOSIS — R52 Pain, unspecified: Secondary | ICD-10-CM | POA: Diagnosis not present

## 2018-10-05 DIAGNOSIS — R140 Abdominal distension (gaseous): Secondary | ICD-10-CM | POA: Diagnosis not present

## 2018-10-05 DIAGNOSIS — K7201 Acute and subacute hepatic failure with coma: Secondary | ICD-10-CM | POA: Diagnosis not present

## 2018-10-05 DIAGNOSIS — R404 Transient alteration of awareness: Secondary | ICD-10-CM | POA: Diagnosis not present

## 2018-10-05 DIAGNOSIS — R0989 Other specified symptoms and signs involving the circulatory and respiratory systems: Secondary | ICD-10-CM | POA: Diagnosis not present

## 2018-10-05 DIAGNOSIS — R569 Unspecified convulsions: Secondary | ICD-10-CM | POA: Diagnosis not present

## 2018-10-05 DIAGNOSIS — I619 Nontraumatic intracerebral hemorrhage, unspecified: Secondary | ICD-10-CM | POA: Diagnosis not present

## 2018-10-05 DIAGNOSIS — D696 Thrombocytopenia, unspecified: Secondary | ICD-10-CM | POA: Diagnosis not present

## 2018-10-05 DIAGNOSIS — R188 Other ascites: Secondary | ICD-10-CM | POA: Diagnosis not present

## 2018-10-05 DIAGNOSIS — K7291 Hepatic failure, unspecified with coma: Secondary | ICD-10-CM | POA: Diagnosis not present

## 2018-10-05 DIAGNOSIS — Z4682 Encounter for fitting and adjustment of non-vascular catheter: Secondary | ICD-10-CM | POA: Diagnosis not present

## 2018-10-05 DIAGNOSIS — R402332 Coma scale, best motor response, abnormal, at arrival to emergency department: Secondary | ICD-10-CM | POA: Diagnosis not present

## 2018-10-05 DIAGNOSIS — R079 Chest pain, unspecified: Secondary | ICD-10-CM | POA: Diagnosis not present

## 2018-10-05 DIAGNOSIS — K7210 Chronic hepatic failure without coma: Secondary | ICD-10-CM | POA: Diagnosis not present

## 2018-10-05 DIAGNOSIS — G319 Degenerative disease of nervous system, unspecified: Secondary | ICD-10-CM | POA: Diagnosis not present

## 2018-10-05 DIAGNOSIS — R278 Other lack of coordination: Secondary | ICD-10-CM | POA: Diagnosis not present

## 2018-10-05 DIAGNOSIS — R402112 Coma scale, eyes open, never, at arrival to emergency department: Secondary | ICD-10-CM | POA: Diagnosis not present

## 2018-10-05 DIAGNOSIS — Z9911 Dependence on respirator [ventilator] status: Secondary | ICD-10-CM | POA: Diagnosis not present

## 2018-10-05 DIAGNOSIS — R402212 Coma scale, best verbal response, none, at arrival to emergency department: Secondary | ICD-10-CM | POA: Diagnosis not present

## 2018-10-05 DIAGNOSIS — Z9111 Patient's noncompliance with dietary regimen: Secondary | ICD-10-CM | POA: Diagnosis not present

## 2018-10-05 DIAGNOSIS — R601 Generalized edema: Secondary | ICD-10-CM | POA: Diagnosis not present

## 2018-10-05 DIAGNOSIS — K8020 Calculus of gallbladder without cholecystitis without obstruction: Secondary | ICD-10-CM | POA: Diagnosis not present

## 2018-10-05 DIAGNOSIS — R932 Abnormal findings on diagnostic imaging of liver and biliary tract: Secondary | ICD-10-CM | POA: Diagnosis not present

## 2018-10-05 DIAGNOSIS — H18049 Kayser-Fleischer ring, unspecified eye: Secondary | ICD-10-CM | POA: Diagnosis not present

## 2018-10-05 DIAGNOSIS — M6281 Muscle weakness (generalized): Secondary | ICD-10-CM | POA: Diagnosis not present

## 2018-10-05 DIAGNOSIS — E440 Moderate protein-calorie malnutrition: Secondary | ICD-10-CM | POA: Diagnosis not present

## 2018-10-05 DIAGNOSIS — M4313 Spondylolisthesis, cervicothoracic region: Secondary | ICD-10-CM | POA: Diagnosis not present

## 2018-10-08 LAB — PATHOLOGIST SMEAR REVIEW

## 2018-10-09 LAB — CULTURE, BODY FLUID W GRAM STAIN -BOTTLE: Culture: NO GROWTH

## 2018-11-04 DIAGNOSIS — R188 Other ascites: Secondary | ICD-10-CM | POA: Diagnosis not present

## 2018-11-04 DIAGNOSIS — K8020 Calculus of gallbladder without cholecystitis without obstruction: Secondary | ICD-10-CM | POA: Diagnosis not present

## 2018-11-04 DIAGNOSIS — M6281 Muscle weakness (generalized): Secondary | ICD-10-CM | POA: Diagnosis not present

## 2018-11-04 DIAGNOSIS — R5081 Fever presenting with conditions classified elsewhere: Secondary | ICD-10-CM | POA: Diagnosis not present

## 2018-11-04 DIAGNOSIS — K7469 Other cirrhosis of liver: Secondary | ICD-10-CM | POA: Diagnosis not present

## 2018-11-04 DIAGNOSIS — G4709 Other insomnia: Secondary | ICD-10-CM | POA: Diagnosis not present

## 2018-11-04 DIAGNOSIS — R278 Other lack of coordination: Secondary | ICD-10-CM | POA: Diagnosis not present

## 2018-11-04 DIAGNOSIS — J9600 Acute respiratory failure, unspecified whether with hypoxia or hypercapnia: Secondary | ICD-10-CM | POA: Diagnosis not present

## 2018-11-04 DIAGNOSIS — K7290 Hepatic failure, unspecified without coma: Secondary | ICD-10-CM | POA: Diagnosis not present

## 2018-11-04 DIAGNOSIS — K7460 Unspecified cirrhosis of liver: Secondary | ICD-10-CM | POA: Diagnosis not present

## 2018-11-04 DIAGNOSIS — G540 Brachial plexus disorders: Secondary | ICD-10-CM | POA: Diagnosis not present

## 2018-11-04 DIAGNOSIS — G4701 Insomnia due to medical condition: Secondary | ICD-10-CM | POA: Diagnosis not present

## 2018-11-04 DIAGNOSIS — R932 Abnormal findings on diagnostic imaging of liver and biliary tract: Secondary | ICD-10-CM | POA: Diagnosis not present

## 2018-11-04 DIAGNOSIS — D6959 Other secondary thrombocytopenia: Secondary | ICD-10-CM | POA: Diagnosis not present

## 2018-11-04 DIAGNOSIS — Z9119 Patient's noncompliance with other medical treatment and regimen: Secondary | ICD-10-CM | POA: Diagnosis not present

## 2018-11-04 DIAGNOSIS — J188 Other pneumonia, unspecified organism: Secondary | ICD-10-CM | POA: Diagnosis not present

## 2018-11-04 DIAGNOSIS — R569 Unspecified convulsions: Secondary | ICD-10-CM | POA: Diagnosis not present

## 2018-11-04 DIAGNOSIS — E440 Moderate protein-calorie malnutrition: Secondary | ICD-10-CM | POA: Diagnosis not present

## 2018-11-05 DIAGNOSIS — G540 Brachial plexus disorders: Secondary | ICD-10-CM | POA: Diagnosis not present

## 2018-11-05 DIAGNOSIS — R188 Other ascites: Secondary | ICD-10-CM | POA: Diagnosis not present

## 2018-11-05 DIAGNOSIS — K7290 Hepatic failure, unspecified without coma: Secondary | ICD-10-CM | POA: Diagnosis not present

## 2018-11-07 DIAGNOSIS — K7469 Other cirrhosis of liver: Secondary | ICD-10-CM | POA: Diagnosis not present

## 2018-11-07 DIAGNOSIS — D6959 Other secondary thrombocytopenia: Secondary | ICD-10-CM | POA: Diagnosis not present

## 2018-11-07 DIAGNOSIS — G540 Brachial plexus disorders: Secondary | ICD-10-CM | POA: Diagnosis not present

## 2018-11-11 DIAGNOSIS — R188 Other ascites: Secondary | ICD-10-CM | POA: Diagnosis not present

## 2018-11-11 DIAGNOSIS — R569 Unspecified convulsions: Secondary | ICD-10-CM | POA: Diagnosis not present

## 2018-11-11 DIAGNOSIS — K7290 Hepatic failure, unspecified without coma: Secondary | ICD-10-CM | POA: Diagnosis not present

## 2018-11-11 DIAGNOSIS — K7460 Unspecified cirrhosis of liver: Secondary | ICD-10-CM | POA: Diagnosis not present

## 2018-11-11 DIAGNOSIS — Z9119 Patient's noncompliance with other medical treatment and regimen: Secondary | ICD-10-CM | POA: Diagnosis not present

## 2018-11-11 DIAGNOSIS — G4701 Insomnia due to medical condition: Secondary | ICD-10-CM | POA: Diagnosis not present

## 2018-11-19 DIAGNOSIS — G540 Brachial plexus disorders: Secondary | ICD-10-CM | POA: Diagnosis not present

## 2018-11-19 DIAGNOSIS — G4709 Other insomnia: Secondary | ICD-10-CM | POA: Diagnosis not present

## 2018-11-19 DIAGNOSIS — K7290 Hepatic failure, unspecified without coma: Secondary | ICD-10-CM | POA: Diagnosis not present

## 2018-11-19 DIAGNOSIS — K7469 Other cirrhosis of liver: Secondary | ICD-10-CM | POA: Diagnosis not present

## 2018-11-21 DIAGNOSIS — R188 Other ascites: Secondary | ICD-10-CM | POA: Diagnosis not present

## 2018-11-21 DIAGNOSIS — K7290 Hepatic failure, unspecified without coma: Secondary | ICD-10-CM | POA: Diagnosis not present

## 2018-11-22 DIAGNOSIS — J188 Other pneumonia, unspecified organism: Secondary | ICD-10-CM | POA: Diagnosis not present

## 2018-11-22 DIAGNOSIS — K7469 Other cirrhosis of liver: Secondary | ICD-10-CM | POA: Diagnosis not present

## 2018-11-26 DIAGNOSIS — K7290 Hepatic failure, unspecified without coma: Secondary | ICD-10-CM | POA: Diagnosis not present

## 2018-11-26 DIAGNOSIS — K7460 Unspecified cirrhosis of liver: Secondary | ICD-10-CM | POA: Diagnosis not present

## 2018-11-26 DIAGNOSIS — G540 Brachial plexus disorders: Secondary | ICD-10-CM | POA: Diagnosis not present

## 2018-11-26 DIAGNOSIS — G4701 Insomnia due to medical condition: Secondary | ICD-10-CM | POA: Diagnosis not present

## 2018-11-26 DIAGNOSIS — J188 Other pneumonia, unspecified organism: Secondary | ICD-10-CM | POA: Diagnosis not present

## 2018-11-26 DIAGNOSIS — R569 Unspecified convulsions: Secondary | ICD-10-CM | POA: Diagnosis not present

## 2018-11-29 DIAGNOSIS — R188 Other ascites: Secondary | ICD-10-CM | POA: Diagnosis not present

## 2018-11-29 DIAGNOSIS — G540 Brachial plexus disorders: Secondary | ICD-10-CM | POA: Diagnosis not present

## 2018-11-29 DIAGNOSIS — K7290 Hepatic failure, unspecified without coma: Secondary | ICD-10-CM | POA: Diagnosis not present

## 2018-12-03 DIAGNOSIS — D696 Thrombocytopenia, unspecified: Secondary | ICD-10-CM | POA: Diagnosis not present

## 2018-12-03 DIAGNOSIS — K7200 Acute and subacute hepatic failure without coma: Secondary | ICD-10-CM | POA: Diagnosis not present

## 2018-12-03 DIAGNOSIS — E871 Hypo-osmolality and hyponatremia: Secondary | ICD-10-CM | POA: Diagnosis not present

## 2018-12-03 DIAGNOSIS — R188 Other ascites: Secondary | ICD-10-CM | POA: Diagnosis not present

## 2018-12-03 DIAGNOSIS — J9600 Acute respiratory failure, unspecified whether with hypoxia or hypercapnia: Secondary | ICD-10-CM | POA: Diagnosis not present

## 2018-12-05 DIAGNOSIS — R490 Dysphonia: Secondary | ICD-10-CM | POA: Diagnosis not present

## 2018-12-05 DIAGNOSIS — E440 Moderate protein-calorie malnutrition: Secondary | ICD-10-CM | POA: Diagnosis not present

## 2018-12-05 DIAGNOSIS — G4700 Insomnia, unspecified: Secondary | ICD-10-CM | POA: Diagnosis not present

## 2018-12-05 DIAGNOSIS — E568 Deficiency of other vitamins: Secondary | ICD-10-CM | POA: Diagnosis not present

## 2018-12-05 DIAGNOSIS — R29898 Other symptoms and signs involving the musculoskeletal system: Secondary | ICD-10-CM | POA: Diagnosis not present

## 2018-12-05 DIAGNOSIS — K219 Gastro-esophageal reflux disease without esophagitis: Secondary | ICD-10-CM | POA: Diagnosis not present

## 2018-12-05 DIAGNOSIS — G540 Brachial plexus disorders: Secondary | ICD-10-CM | POA: Diagnosis not present

## 2018-12-05 DIAGNOSIS — K7290 Hepatic failure, unspecified without coma: Secondary | ICD-10-CM | POA: Diagnosis not present

## 2018-12-05 DIAGNOSIS — J9690 Respiratory failure, unspecified, unspecified whether with hypoxia or hypercapnia: Secondary | ICD-10-CM | POA: Diagnosis not present

## 2018-12-05 DIAGNOSIS — J189 Pneumonia, unspecified organism: Secondary | ICD-10-CM | POA: Diagnosis not present

## 2018-12-05 DIAGNOSIS — M6281 Muscle weakness (generalized): Secondary | ICD-10-CM | POA: Diagnosis not present

## 2018-12-05 DIAGNOSIS — H18049 Kayser-Fleischer ring, unspecified eye: Secondary | ICD-10-CM | POA: Diagnosis not present

## 2018-12-05 DIAGNOSIS — G4762 Sleep related leg cramps: Secondary | ICD-10-CM | POA: Diagnosis not present

## 2018-12-05 DIAGNOSIS — D696 Thrombocytopenia, unspecified: Secondary | ICD-10-CM | POA: Diagnosis not present

## 2018-12-10 ENCOUNTER — Other Ambulatory Visit: Payer: Self-pay | Admitting: Internal Medicine

## 2018-12-11 DIAGNOSIS — Z79899 Other long term (current) drug therapy: Secondary | ICD-10-CM | POA: Diagnosis not present

## 2018-12-11 DIAGNOSIS — E440 Moderate protein-calorie malnutrition: Secondary | ICD-10-CM | POA: Diagnosis not present

## 2018-12-11 DIAGNOSIS — G4762 Sleep related leg cramps: Secondary | ICD-10-CM | POA: Diagnosis not present

## 2018-12-11 DIAGNOSIS — J9690 Respiratory failure, unspecified, unspecified whether with hypoxia or hypercapnia: Secondary | ICD-10-CM | POA: Diagnosis not present

## 2018-12-11 DIAGNOSIS — M6281 Muscle weakness (generalized): Secondary | ICD-10-CM | POA: Diagnosis not present

## 2018-12-11 DIAGNOSIS — E568 Deficiency of other vitamins: Secondary | ICD-10-CM | POA: Diagnosis not present

## 2018-12-11 DIAGNOSIS — J189 Pneumonia, unspecified organism: Secondary | ICD-10-CM | POA: Diagnosis not present

## 2018-12-11 DIAGNOSIS — R188 Other ascites: Secondary | ICD-10-CM | POA: Diagnosis not present

## 2018-12-11 DIAGNOSIS — K7460 Unspecified cirrhosis of liver: Secondary | ICD-10-CM | POA: Diagnosis not present

## 2018-12-11 DIAGNOSIS — D696 Thrombocytopenia, unspecified: Secondary | ICD-10-CM | POA: Diagnosis not present

## 2018-12-11 DIAGNOSIS — K7290 Hepatic failure, unspecified without coma: Secondary | ICD-10-CM | POA: Diagnosis not present

## 2018-12-11 DIAGNOSIS — R490 Dysphonia: Secondary | ICD-10-CM | POA: Diagnosis not present

## 2018-12-11 DIAGNOSIS — R29898 Other symptoms and signs involving the musculoskeletal system: Secondary | ICD-10-CM | POA: Diagnosis not present

## 2018-12-11 DIAGNOSIS — K219 Gastro-esophageal reflux disease without esophagitis: Secondary | ICD-10-CM | POA: Diagnosis not present

## 2018-12-11 DIAGNOSIS — H18049 Kayser-Fleischer ring, unspecified eye: Secondary | ICD-10-CM | POA: Diagnosis not present

## 2018-12-11 DIAGNOSIS — G540 Brachial plexus disorders: Secondary | ICD-10-CM | POA: Diagnosis not present

## 2018-12-11 DIAGNOSIS — R252 Cramp and spasm: Secondary | ICD-10-CM | POA: Diagnosis not present

## 2018-12-11 DIAGNOSIS — G4700 Insomnia, unspecified: Secondary | ICD-10-CM | POA: Diagnosis not present

## 2018-12-12 DIAGNOSIS — M6281 Muscle weakness (generalized): Secondary | ICD-10-CM | POA: Diagnosis not present

## 2018-12-12 DIAGNOSIS — G4762 Sleep related leg cramps: Secondary | ICD-10-CM | POA: Diagnosis not present

## 2018-12-12 DIAGNOSIS — R29898 Other symptoms and signs involving the musculoskeletal system: Secondary | ICD-10-CM | POA: Diagnosis not present

## 2018-12-12 DIAGNOSIS — E568 Deficiency of other vitamins: Secondary | ICD-10-CM | POA: Diagnosis not present

## 2018-12-12 DIAGNOSIS — J9690 Respiratory failure, unspecified, unspecified whether with hypoxia or hypercapnia: Secondary | ICD-10-CM | POA: Diagnosis not present

## 2018-12-12 DIAGNOSIS — K219 Gastro-esophageal reflux disease without esophagitis: Secondary | ICD-10-CM | POA: Diagnosis not present

## 2018-12-12 DIAGNOSIS — D696 Thrombocytopenia, unspecified: Secondary | ICD-10-CM | POA: Diagnosis not present

## 2018-12-12 DIAGNOSIS — G4700 Insomnia, unspecified: Secondary | ICD-10-CM | POA: Diagnosis not present

## 2018-12-12 DIAGNOSIS — R490 Dysphonia: Secondary | ICD-10-CM | POA: Diagnosis not present

## 2018-12-12 DIAGNOSIS — E440 Moderate protein-calorie malnutrition: Secondary | ICD-10-CM | POA: Diagnosis not present

## 2018-12-12 DIAGNOSIS — J189 Pneumonia, unspecified organism: Secondary | ICD-10-CM | POA: Diagnosis not present

## 2018-12-12 DIAGNOSIS — K7290 Hepatic failure, unspecified without coma: Secondary | ICD-10-CM | POA: Diagnosis not present

## 2018-12-12 DIAGNOSIS — H18049 Kayser-Fleischer ring, unspecified eye: Secondary | ICD-10-CM | POA: Diagnosis not present

## 2018-12-12 DIAGNOSIS — G540 Brachial plexus disorders: Secondary | ICD-10-CM | POA: Diagnosis not present

## 2018-12-13 DIAGNOSIS — D649 Anemia, unspecified: Secondary | ICD-10-CM | POA: Diagnosis not present

## 2018-12-13 DIAGNOSIS — E039 Hypothyroidism, unspecified: Secondary | ICD-10-CM | POA: Diagnosis not present

## 2018-12-13 DIAGNOSIS — K7290 Hepatic failure, unspecified without coma: Secondary | ICD-10-CM | POA: Diagnosis not present

## 2018-12-13 DIAGNOSIS — E119 Type 2 diabetes mellitus without complications: Secondary | ICD-10-CM | POA: Diagnosis not present

## 2018-12-13 DIAGNOSIS — I4891 Unspecified atrial fibrillation: Secondary | ICD-10-CM | POA: Diagnosis not present

## 2018-12-13 DIAGNOSIS — E785 Hyperlipidemia, unspecified: Secondary | ICD-10-CM | POA: Diagnosis not present

## 2018-12-16 DIAGNOSIS — D696 Thrombocytopenia, unspecified: Secondary | ICD-10-CM | POA: Diagnosis not present

## 2018-12-16 DIAGNOSIS — E876 Hypokalemia: Secondary | ICD-10-CM | POA: Diagnosis not present

## 2018-12-16 DIAGNOSIS — K7460 Unspecified cirrhosis of liver: Secondary | ICD-10-CM | POA: Diagnosis not present

## 2018-12-16 DIAGNOSIS — K7290 Hepatic failure, unspecified without coma: Secondary | ICD-10-CM | POA: Diagnosis not present

## 2018-12-16 DIAGNOSIS — R188 Other ascites: Secondary | ICD-10-CM | POA: Diagnosis not present

## 2018-12-17 DIAGNOSIS — K7290 Hepatic failure, unspecified without coma: Secondary | ICD-10-CM | POA: Diagnosis not present

## 2018-12-17 DIAGNOSIS — R29898 Other symptoms and signs involving the musculoskeletal system: Secondary | ICD-10-CM | POA: Diagnosis not present

## 2018-12-17 DIAGNOSIS — G4762 Sleep related leg cramps: Secondary | ICD-10-CM | POA: Diagnosis not present

## 2018-12-17 DIAGNOSIS — K219 Gastro-esophageal reflux disease without esophagitis: Secondary | ICD-10-CM | POA: Diagnosis not present

## 2018-12-17 DIAGNOSIS — H18049 Kayser-Fleischer ring, unspecified eye: Secondary | ICD-10-CM | POA: Diagnosis not present

## 2018-12-17 DIAGNOSIS — E568 Deficiency of other vitamins: Secondary | ICD-10-CM | POA: Diagnosis not present

## 2018-12-17 DIAGNOSIS — D696 Thrombocytopenia, unspecified: Secondary | ICD-10-CM | POA: Diagnosis not present

## 2018-12-17 DIAGNOSIS — J9690 Respiratory failure, unspecified, unspecified whether with hypoxia or hypercapnia: Secondary | ICD-10-CM | POA: Diagnosis not present

## 2018-12-17 DIAGNOSIS — G540 Brachial plexus disorders: Secondary | ICD-10-CM | POA: Diagnosis not present

## 2018-12-17 DIAGNOSIS — R490 Dysphonia: Secondary | ICD-10-CM | POA: Diagnosis not present

## 2018-12-17 DIAGNOSIS — M6281 Muscle weakness (generalized): Secondary | ICD-10-CM | POA: Diagnosis not present

## 2018-12-17 DIAGNOSIS — E440 Moderate protein-calorie malnutrition: Secondary | ICD-10-CM | POA: Diagnosis not present

## 2018-12-17 DIAGNOSIS — J189 Pneumonia, unspecified organism: Secondary | ICD-10-CM | POA: Diagnosis not present

## 2018-12-17 DIAGNOSIS — G4700 Insomnia, unspecified: Secondary | ICD-10-CM | POA: Diagnosis not present

## 2018-12-19 DIAGNOSIS — H18049 Kayser-Fleischer ring, unspecified eye: Secondary | ICD-10-CM | POA: Diagnosis not present

## 2018-12-19 DIAGNOSIS — R490 Dysphonia: Secondary | ICD-10-CM | POA: Diagnosis not present

## 2018-12-19 DIAGNOSIS — R29898 Other symptoms and signs involving the musculoskeletal system: Secondary | ICD-10-CM | POA: Diagnosis not present

## 2018-12-19 DIAGNOSIS — G4700 Insomnia, unspecified: Secondary | ICD-10-CM | POA: Diagnosis not present

## 2018-12-19 DIAGNOSIS — K219 Gastro-esophageal reflux disease without esophagitis: Secondary | ICD-10-CM | POA: Diagnosis not present

## 2018-12-19 DIAGNOSIS — K7290 Hepatic failure, unspecified without coma: Secondary | ICD-10-CM | POA: Diagnosis not present

## 2018-12-19 DIAGNOSIS — E440 Moderate protein-calorie malnutrition: Secondary | ICD-10-CM | POA: Diagnosis not present

## 2018-12-19 DIAGNOSIS — G4762 Sleep related leg cramps: Secondary | ICD-10-CM | POA: Diagnosis not present

## 2018-12-19 DIAGNOSIS — J189 Pneumonia, unspecified organism: Secondary | ICD-10-CM | POA: Diagnosis not present

## 2018-12-19 DIAGNOSIS — D696 Thrombocytopenia, unspecified: Secondary | ICD-10-CM | POA: Diagnosis not present

## 2018-12-19 DIAGNOSIS — G540 Brachial plexus disorders: Secondary | ICD-10-CM | POA: Diagnosis not present

## 2018-12-19 DIAGNOSIS — M6281 Muscle weakness (generalized): Secondary | ICD-10-CM | POA: Diagnosis not present

## 2018-12-19 DIAGNOSIS — E568 Deficiency of other vitamins: Secondary | ICD-10-CM | POA: Diagnosis not present

## 2018-12-19 DIAGNOSIS — J9690 Respiratory failure, unspecified, unspecified whether with hypoxia or hypercapnia: Secondary | ICD-10-CM | POA: Diagnosis not present

## 2018-12-24 DIAGNOSIS — G4700 Insomnia, unspecified: Secondary | ICD-10-CM | POA: Diagnosis not present

## 2018-12-24 DIAGNOSIS — E440 Moderate protein-calorie malnutrition: Secondary | ICD-10-CM | POA: Diagnosis not present

## 2018-12-24 DIAGNOSIS — R29898 Other symptoms and signs involving the musculoskeletal system: Secondary | ICD-10-CM | POA: Diagnosis not present

## 2018-12-24 DIAGNOSIS — K7290 Hepatic failure, unspecified without coma: Secondary | ICD-10-CM | POA: Diagnosis not present

## 2018-12-24 DIAGNOSIS — R490 Dysphonia: Secondary | ICD-10-CM | POA: Diagnosis not present

## 2018-12-24 DIAGNOSIS — D696 Thrombocytopenia, unspecified: Secondary | ICD-10-CM | POA: Diagnosis not present

## 2018-12-24 DIAGNOSIS — M6281 Muscle weakness (generalized): Secondary | ICD-10-CM | POA: Diagnosis not present

## 2018-12-24 DIAGNOSIS — J9690 Respiratory failure, unspecified, unspecified whether with hypoxia or hypercapnia: Secondary | ICD-10-CM | POA: Diagnosis not present

## 2018-12-24 DIAGNOSIS — G4762 Sleep related leg cramps: Secondary | ICD-10-CM | POA: Diagnosis not present

## 2018-12-24 DIAGNOSIS — G540 Brachial plexus disorders: Secondary | ICD-10-CM | POA: Diagnosis not present

## 2018-12-24 DIAGNOSIS — H18049 Kayser-Fleischer ring, unspecified eye: Secondary | ICD-10-CM | POA: Diagnosis not present

## 2018-12-24 DIAGNOSIS — E568 Deficiency of other vitamins: Secondary | ICD-10-CM | POA: Diagnosis not present

## 2018-12-24 DIAGNOSIS — J189 Pneumonia, unspecified organism: Secondary | ICD-10-CM | POA: Diagnosis not present

## 2018-12-24 DIAGNOSIS — K219 Gastro-esophageal reflux disease without esophagitis: Secondary | ICD-10-CM | POA: Diagnosis not present

## 2018-12-26 DIAGNOSIS — J9690 Respiratory failure, unspecified, unspecified whether with hypoxia or hypercapnia: Secondary | ICD-10-CM | POA: Diagnosis not present

## 2018-12-26 DIAGNOSIS — G540 Brachial plexus disorders: Secondary | ICD-10-CM | POA: Diagnosis not present

## 2018-12-26 DIAGNOSIS — R29898 Other symptoms and signs involving the musculoskeletal system: Secondary | ICD-10-CM | POA: Diagnosis not present

## 2018-12-26 DIAGNOSIS — G4700 Insomnia, unspecified: Secondary | ICD-10-CM | POA: Diagnosis not present

## 2018-12-26 DIAGNOSIS — K219 Gastro-esophageal reflux disease without esophagitis: Secondary | ICD-10-CM | POA: Diagnosis not present

## 2018-12-26 DIAGNOSIS — J189 Pneumonia, unspecified organism: Secondary | ICD-10-CM | POA: Diagnosis not present

## 2018-12-26 DIAGNOSIS — E568 Deficiency of other vitamins: Secondary | ICD-10-CM | POA: Diagnosis not present

## 2018-12-26 DIAGNOSIS — H18049 Kayser-Fleischer ring, unspecified eye: Secondary | ICD-10-CM | POA: Diagnosis not present

## 2018-12-26 DIAGNOSIS — R490 Dysphonia: Secondary | ICD-10-CM | POA: Diagnosis not present

## 2018-12-26 DIAGNOSIS — D696 Thrombocytopenia, unspecified: Secondary | ICD-10-CM | POA: Diagnosis not present

## 2018-12-26 DIAGNOSIS — M6281 Muscle weakness (generalized): Secondary | ICD-10-CM | POA: Diagnosis not present

## 2018-12-26 DIAGNOSIS — K7290 Hepatic failure, unspecified without coma: Secondary | ICD-10-CM | POA: Diagnosis not present

## 2018-12-26 DIAGNOSIS — E440 Moderate protein-calorie malnutrition: Secondary | ICD-10-CM | POA: Diagnosis not present

## 2018-12-26 DIAGNOSIS — G4762 Sleep related leg cramps: Secondary | ICD-10-CM | POA: Diagnosis not present

## 2018-12-27 DIAGNOSIS — D519 Vitamin B12 deficiency anemia, unspecified: Secondary | ICD-10-CM | POA: Diagnosis not present

## 2018-12-27 DIAGNOSIS — D649 Anemia, unspecified: Secondary | ICD-10-CM | POA: Diagnosis not present

## 2018-12-27 DIAGNOSIS — E876 Hypokalemia: Secondary | ICD-10-CM | POA: Diagnosis not present

## 2018-12-31 DIAGNOSIS — J189 Pneumonia, unspecified organism: Secondary | ICD-10-CM | POA: Diagnosis not present

## 2018-12-31 DIAGNOSIS — H18049 Kayser-Fleischer ring, unspecified eye: Secondary | ICD-10-CM | POA: Diagnosis not present

## 2018-12-31 DIAGNOSIS — K7290 Hepatic failure, unspecified without coma: Secondary | ICD-10-CM | POA: Diagnosis not present

## 2018-12-31 DIAGNOSIS — G4700 Insomnia, unspecified: Secondary | ICD-10-CM | POA: Diagnosis not present

## 2018-12-31 DIAGNOSIS — M6281 Muscle weakness (generalized): Secondary | ICD-10-CM | POA: Diagnosis not present

## 2018-12-31 DIAGNOSIS — G4762 Sleep related leg cramps: Secondary | ICD-10-CM | POA: Diagnosis not present

## 2018-12-31 DIAGNOSIS — G540 Brachial plexus disorders: Secondary | ICD-10-CM | POA: Diagnosis not present

## 2018-12-31 DIAGNOSIS — R490 Dysphonia: Secondary | ICD-10-CM | POA: Diagnosis not present

## 2018-12-31 DIAGNOSIS — D696 Thrombocytopenia, unspecified: Secondary | ICD-10-CM | POA: Diagnosis not present

## 2018-12-31 DIAGNOSIS — E440 Moderate protein-calorie malnutrition: Secondary | ICD-10-CM | POA: Diagnosis not present

## 2018-12-31 DIAGNOSIS — R29898 Other symptoms and signs involving the musculoskeletal system: Secondary | ICD-10-CM | POA: Diagnosis not present

## 2018-12-31 DIAGNOSIS — E568 Deficiency of other vitamins: Secondary | ICD-10-CM | POA: Diagnosis not present

## 2018-12-31 DIAGNOSIS — K219 Gastro-esophageal reflux disease without esophagitis: Secondary | ICD-10-CM | POA: Diagnosis not present

## 2018-12-31 DIAGNOSIS — J9690 Respiratory failure, unspecified, unspecified whether with hypoxia or hypercapnia: Secondary | ICD-10-CM | POA: Diagnosis not present

## 2019-01-02 DIAGNOSIS — K219 Gastro-esophageal reflux disease without esophagitis: Secondary | ICD-10-CM | POA: Diagnosis not present

## 2019-01-02 DIAGNOSIS — M6281 Muscle weakness (generalized): Secondary | ICD-10-CM | POA: Diagnosis not present

## 2019-01-02 DIAGNOSIS — R29898 Other symptoms and signs involving the musculoskeletal system: Secondary | ICD-10-CM | POA: Diagnosis not present

## 2019-01-02 DIAGNOSIS — G540 Brachial plexus disorders: Secondary | ICD-10-CM | POA: Diagnosis not present

## 2019-01-02 DIAGNOSIS — J9690 Respiratory failure, unspecified, unspecified whether with hypoxia or hypercapnia: Secondary | ICD-10-CM | POA: Diagnosis not present

## 2019-01-02 DIAGNOSIS — H18049 Kayser-Fleischer ring, unspecified eye: Secondary | ICD-10-CM | POA: Diagnosis not present

## 2019-01-02 DIAGNOSIS — R490 Dysphonia: Secondary | ICD-10-CM | POA: Diagnosis not present

## 2019-01-02 DIAGNOSIS — D696 Thrombocytopenia, unspecified: Secondary | ICD-10-CM | POA: Diagnosis not present

## 2019-01-02 DIAGNOSIS — J189 Pneumonia, unspecified organism: Secondary | ICD-10-CM | POA: Diagnosis not present

## 2019-01-02 DIAGNOSIS — G4762 Sleep related leg cramps: Secondary | ICD-10-CM | POA: Diagnosis not present

## 2019-01-02 DIAGNOSIS — E440 Moderate protein-calorie malnutrition: Secondary | ICD-10-CM | POA: Diagnosis not present

## 2019-01-02 DIAGNOSIS — K7290 Hepatic failure, unspecified without coma: Secondary | ICD-10-CM | POA: Diagnosis not present

## 2019-01-02 DIAGNOSIS — E568 Deficiency of other vitamins: Secondary | ICD-10-CM | POA: Diagnosis not present

## 2019-01-02 DIAGNOSIS — G4700 Insomnia, unspecified: Secondary | ICD-10-CM | POA: Diagnosis not present

## 2019-01-03 DIAGNOSIS — R188 Other ascites: Secondary | ICD-10-CM | POA: Diagnosis not present

## 2019-01-03 DIAGNOSIS — K7460 Unspecified cirrhosis of liver: Secondary | ICD-10-CM | POA: Diagnosis not present

## 2019-01-06 DIAGNOSIS — E568 Deficiency of other vitamins: Secondary | ICD-10-CM | POA: Diagnosis not present

## 2019-01-06 DIAGNOSIS — H18049 Kayser-Fleischer ring, unspecified eye: Secondary | ICD-10-CM | POA: Diagnosis not present

## 2019-01-06 DIAGNOSIS — D696 Thrombocytopenia, unspecified: Secondary | ICD-10-CM | POA: Diagnosis not present

## 2019-01-06 DIAGNOSIS — E440 Moderate protein-calorie malnutrition: Secondary | ICD-10-CM | POA: Diagnosis not present

## 2019-01-06 DIAGNOSIS — R490 Dysphonia: Secondary | ICD-10-CM | POA: Diagnosis not present

## 2019-01-06 DIAGNOSIS — K7290 Hepatic failure, unspecified without coma: Secondary | ICD-10-CM | POA: Diagnosis not present

## 2019-01-06 DIAGNOSIS — M6281 Muscle weakness (generalized): Secondary | ICD-10-CM | POA: Diagnosis not present

## 2019-01-06 DIAGNOSIS — R29898 Other symptoms and signs involving the musculoskeletal system: Secondary | ICD-10-CM | POA: Diagnosis not present

## 2019-01-06 DIAGNOSIS — G540 Brachial plexus disorders: Secondary | ICD-10-CM | POA: Diagnosis not present

## 2019-01-06 DIAGNOSIS — G4762 Sleep related leg cramps: Secondary | ICD-10-CM | POA: Diagnosis not present

## 2019-01-06 DIAGNOSIS — J189 Pneumonia, unspecified organism: Secondary | ICD-10-CM | POA: Diagnosis not present

## 2019-01-06 DIAGNOSIS — K219 Gastro-esophageal reflux disease without esophagitis: Secondary | ICD-10-CM | POA: Diagnosis not present

## 2019-01-06 DIAGNOSIS — J9690 Respiratory failure, unspecified, unspecified whether with hypoxia or hypercapnia: Secondary | ICD-10-CM | POA: Diagnosis not present

## 2019-01-06 DIAGNOSIS — G4700 Insomnia, unspecified: Secondary | ICD-10-CM | POA: Diagnosis not present

## 2019-01-07 DIAGNOSIS — K7460 Unspecified cirrhosis of liver: Secondary | ICD-10-CM | POA: Diagnosis not present

## 2019-01-07 DIAGNOSIS — Z79899 Other long term (current) drug therapy: Secondary | ICD-10-CM | POA: Diagnosis not present

## 2019-01-07 DIAGNOSIS — R188 Other ascites: Secondary | ICD-10-CM | POA: Diagnosis not present

## 2019-01-11 DIAGNOSIS — G4762 Sleep related leg cramps: Secondary | ICD-10-CM | POA: Diagnosis not present

## 2019-01-11 DIAGNOSIS — K219 Gastro-esophageal reflux disease without esophagitis: Secondary | ICD-10-CM | POA: Diagnosis not present

## 2019-01-11 DIAGNOSIS — R490 Dysphonia: Secondary | ICD-10-CM | POA: Diagnosis not present

## 2019-01-11 DIAGNOSIS — E568 Deficiency of other vitamins: Secondary | ICD-10-CM | POA: Diagnosis not present

## 2019-01-11 DIAGNOSIS — H18049 Kayser-Fleischer ring, unspecified eye: Secondary | ICD-10-CM | POA: Diagnosis not present

## 2019-01-11 DIAGNOSIS — J9690 Respiratory failure, unspecified, unspecified whether with hypoxia or hypercapnia: Secondary | ICD-10-CM | POA: Diagnosis not present

## 2019-01-11 DIAGNOSIS — J189 Pneumonia, unspecified organism: Secondary | ICD-10-CM | POA: Diagnosis not present

## 2019-01-11 DIAGNOSIS — D696 Thrombocytopenia, unspecified: Secondary | ICD-10-CM | POA: Diagnosis not present

## 2019-01-11 DIAGNOSIS — R29898 Other symptoms and signs involving the musculoskeletal system: Secondary | ICD-10-CM | POA: Diagnosis not present

## 2019-01-11 DIAGNOSIS — K7290 Hepatic failure, unspecified without coma: Secondary | ICD-10-CM | POA: Diagnosis not present

## 2019-01-11 DIAGNOSIS — G540 Brachial plexus disorders: Secondary | ICD-10-CM | POA: Diagnosis not present

## 2019-01-11 DIAGNOSIS — E440 Moderate protein-calorie malnutrition: Secondary | ICD-10-CM | POA: Diagnosis not present

## 2019-01-11 DIAGNOSIS — M6281 Muscle weakness (generalized): Secondary | ICD-10-CM | POA: Diagnosis not present

## 2019-01-11 DIAGNOSIS — G4700 Insomnia, unspecified: Secondary | ICD-10-CM | POA: Diagnosis not present

## 2019-01-13 DIAGNOSIS — M79673 Pain in unspecified foot: Secondary | ICD-10-CM | POA: Diagnosis not present

## 2019-01-13 DIAGNOSIS — B351 Tinea unguium: Secondary | ICD-10-CM | POA: Diagnosis not present

## 2019-01-13 DIAGNOSIS — L851 Acquired keratosis [keratoderma] palmaris et plantaris: Secondary | ICD-10-CM | POA: Diagnosis not present

## 2019-01-13 DIAGNOSIS — L603 Nail dystrophy: Secondary | ICD-10-CM | POA: Diagnosis not present

## 2019-01-16 DIAGNOSIS — R490 Dysphonia: Secondary | ICD-10-CM | POA: Diagnosis not present

## 2019-01-16 DIAGNOSIS — J9690 Respiratory failure, unspecified, unspecified whether with hypoxia or hypercapnia: Secondary | ICD-10-CM | POA: Diagnosis not present

## 2019-01-16 DIAGNOSIS — K219 Gastro-esophageal reflux disease without esophagitis: Secondary | ICD-10-CM | POA: Diagnosis not present

## 2019-01-16 DIAGNOSIS — K7290 Hepatic failure, unspecified without coma: Secondary | ICD-10-CM | POA: Diagnosis not present

## 2019-01-16 DIAGNOSIS — H18049 Kayser-Fleischer ring, unspecified eye: Secondary | ICD-10-CM | POA: Diagnosis not present

## 2019-01-16 DIAGNOSIS — R29898 Other symptoms and signs involving the musculoskeletal system: Secondary | ICD-10-CM | POA: Diagnosis not present

## 2019-01-16 DIAGNOSIS — G4762 Sleep related leg cramps: Secondary | ICD-10-CM | POA: Diagnosis not present

## 2019-01-16 DIAGNOSIS — D696 Thrombocytopenia, unspecified: Secondary | ICD-10-CM | POA: Diagnosis not present

## 2019-01-16 DIAGNOSIS — E568 Deficiency of other vitamins: Secondary | ICD-10-CM | POA: Diagnosis not present

## 2019-01-16 DIAGNOSIS — E440 Moderate protein-calorie malnutrition: Secondary | ICD-10-CM | POA: Diagnosis not present

## 2019-01-16 DIAGNOSIS — M6281 Muscle weakness (generalized): Secondary | ICD-10-CM | POA: Diagnosis not present

## 2019-01-16 DIAGNOSIS — J189 Pneumonia, unspecified organism: Secondary | ICD-10-CM | POA: Diagnosis not present

## 2019-01-16 DIAGNOSIS — G540 Brachial plexus disorders: Secondary | ICD-10-CM | POA: Diagnosis not present

## 2019-01-16 DIAGNOSIS — G4700 Insomnia, unspecified: Secondary | ICD-10-CM | POA: Diagnosis not present

## 2019-01-18 DIAGNOSIS — J189 Pneumonia, unspecified organism: Secondary | ICD-10-CM | POA: Diagnosis not present

## 2019-01-18 DIAGNOSIS — K219 Gastro-esophageal reflux disease without esophagitis: Secondary | ICD-10-CM | POA: Diagnosis not present

## 2019-01-18 DIAGNOSIS — J9690 Respiratory failure, unspecified, unspecified whether with hypoxia or hypercapnia: Secondary | ICD-10-CM | POA: Diagnosis not present

## 2019-01-18 DIAGNOSIS — G540 Brachial plexus disorders: Secondary | ICD-10-CM | POA: Diagnosis not present

## 2019-01-18 DIAGNOSIS — G4700 Insomnia, unspecified: Secondary | ICD-10-CM | POA: Diagnosis not present

## 2019-01-18 DIAGNOSIS — K7290 Hepatic failure, unspecified without coma: Secondary | ICD-10-CM | POA: Diagnosis not present

## 2019-01-18 DIAGNOSIS — H18049 Kayser-Fleischer ring, unspecified eye: Secondary | ICD-10-CM | POA: Diagnosis not present

## 2019-01-18 DIAGNOSIS — E440 Moderate protein-calorie malnutrition: Secondary | ICD-10-CM | POA: Diagnosis not present

## 2019-01-18 DIAGNOSIS — E568 Deficiency of other vitamins: Secondary | ICD-10-CM | POA: Diagnosis not present

## 2019-01-18 DIAGNOSIS — M6281 Muscle weakness (generalized): Secondary | ICD-10-CM | POA: Diagnosis not present

## 2019-01-18 DIAGNOSIS — R490 Dysphonia: Secondary | ICD-10-CM | POA: Diagnosis not present

## 2019-01-18 DIAGNOSIS — D696 Thrombocytopenia, unspecified: Secondary | ICD-10-CM | POA: Diagnosis not present

## 2019-01-18 DIAGNOSIS — R29898 Other symptoms and signs involving the musculoskeletal system: Secondary | ICD-10-CM | POA: Diagnosis not present

## 2019-01-18 DIAGNOSIS — G4762 Sleep related leg cramps: Secondary | ICD-10-CM | POA: Diagnosis not present

## 2019-01-21 DIAGNOSIS — R490 Dysphonia: Secondary | ICD-10-CM | POA: Diagnosis not present

## 2019-01-21 DIAGNOSIS — H18049 Kayser-Fleischer ring, unspecified eye: Secondary | ICD-10-CM | POA: Diagnosis not present

## 2019-01-21 DIAGNOSIS — K219 Gastro-esophageal reflux disease without esophagitis: Secondary | ICD-10-CM | POA: Diagnosis not present

## 2019-01-21 DIAGNOSIS — G4700 Insomnia, unspecified: Secondary | ICD-10-CM | POA: Diagnosis not present

## 2019-01-21 DIAGNOSIS — G540 Brachial plexus disorders: Secondary | ICD-10-CM | POA: Diagnosis not present

## 2019-01-21 DIAGNOSIS — R29898 Other symptoms and signs involving the musculoskeletal system: Secondary | ICD-10-CM | POA: Diagnosis not present

## 2019-01-21 DIAGNOSIS — K7290 Hepatic failure, unspecified without coma: Secondary | ICD-10-CM | POA: Diagnosis not present

## 2019-01-21 DIAGNOSIS — G4762 Sleep related leg cramps: Secondary | ICD-10-CM | POA: Diagnosis not present

## 2019-01-21 DIAGNOSIS — M6281 Muscle weakness (generalized): Secondary | ICD-10-CM | POA: Diagnosis not present

## 2019-01-21 DIAGNOSIS — D696 Thrombocytopenia, unspecified: Secondary | ICD-10-CM | POA: Diagnosis not present

## 2019-01-21 DIAGNOSIS — E568 Deficiency of other vitamins: Secondary | ICD-10-CM | POA: Diagnosis not present

## 2019-01-21 DIAGNOSIS — J9690 Respiratory failure, unspecified, unspecified whether with hypoxia or hypercapnia: Secondary | ICD-10-CM | POA: Diagnosis not present

## 2019-01-21 DIAGNOSIS — E440 Moderate protein-calorie malnutrition: Secondary | ICD-10-CM | POA: Diagnosis not present

## 2019-01-21 DIAGNOSIS — J189 Pneumonia, unspecified organism: Secondary | ICD-10-CM | POA: Diagnosis not present

## 2019-01-23 DIAGNOSIS — R490 Dysphonia: Secondary | ICD-10-CM | POA: Diagnosis not present

## 2019-01-23 DIAGNOSIS — K219 Gastro-esophageal reflux disease without esophagitis: Secondary | ICD-10-CM | POA: Diagnosis not present

## 2019-01-23 DIAGNOSIS — E440 Moderate protein-calorie malnutrition: Secondary | ICD-10-CM | POA: Diagnosis not present

## 2019-01-23 DIAGNOSIS — K7290 Hepatic failure, unspecified without coma: Secondary | ICD-10-CM | POA: Diagnosis not present

## 2019-01-23 DIAGNOSIS — R29898 Other symptoms and signs involving the musculoskeletal system: Secondary | ICD-10-CM | POA: Diagnosis not present

## 2019-01-23 DIAGNOSIS — J189 Pneumonia, unspecified organism: Secondary | ICD-10-CM | POA: Diagnosis not present

## 2019-01-23 DIAGNOSIS — E568 Deficiency of other vitamins: Secondary | ICD-10-CM | POA: Diagnosis not present

## 2019-01-23 DIAGNOSIS — G4762 Sleep related leg cramps: Secondary | ICD-10-CM | POA: Diagnosis not present

## 2019-01-23 DIAGNOSIS — J9690 Respiratory failure, unspecified, unspecified whether with hypoxia or hypercapnia: Secondary | ICD-10-CM | POA: Diagnosis not present

## 2019-01-23 DIAGNOSIS — G4700 Insomnia, unspecified: Secondary | ICD-10-CM | POA: Diagnosis not present

## 2019-01-23 DIAGNOSIS — M6281 Muscle weakness (generalized): Secondary | ICD-10-CM | POA: Diagnosis not present

## 2019-01-23 DIAGNOSIS — G540 Brachial plexus disorders: Secondary | ICD-10-CM | POA: Diagnosis not present

## 2019-01-23 DIAGNOSIS — D696 Thrombocytopenia, unspecified: Secondary | ICD-10-CM | POA: Diagnosis not present

## 2019-01-23 DIAGNOSIS — H18049 Kayser-Fleischer ring, unspecified eye: Secondary | ICD-10-CM | POA: Diagnosis not present

## 2019-01-24 DIAGNOSIS — K429 Umbilical hernia without obstruction or gangrene: Secondary | ICD-10-CM | POA: Diagnosis not present

## 2019-01-24 DIAGNOSIS — K7460 Unspecified cirrhosis of liver: Secondary | ICD-10-CM | POA: Diagnosis not present

## 2019-01-24 DIAGNOSIS — R52 Pain, unspecified: Secondary | ICD-10-CM | POA: Diagnosis not present

## 2019-01-24 DIAGNOSIS — L03311 Cellulitis of abdominal wall: Secondary | ICD-10-CM | POA: Diagnosis not present

## 2019-01-24 DIAGNOSIS — R188 Other ascites: Secondary | ICD-10-CM | POA: Diagnosis not present

## 2019-01-24 DIAGNOSIS — Z79899 Other long term (current) drug therapy: Secondary | ICD-10-CM | POA: Diagnosis not present

## 2019-01-24 DIAGNOSIS — R932 Abnormal findings on diagnostic imaging of liver and biliary tract: Secondary | ICD-10-CM | POA: Diagnosis not present

## 2019-01-28 DIAGNOSIS — K7460 Unspecified cirrhosis of liver: Secondary | ICD-10-CM | POA: Diagnosis not present

## 2019-01-28 DIAGNOSIS — R188 Other ascites: Secondary | ICD-10-CM | POA: Diagnosis not present

## 2019-01-28 DIAGNOSIS — E876 Hypokalemia: Secondary | ICD-10-CM | POA: Diagnosis not present

## 2019-01-28 DIAGNOSIS — D696 Thrombocytopenia, unspecified: Secondary | ICD-10-CM | POA: Diagnosis not present

## 2019-01-28 DIAGNOSIS — K429 Umbilical hernia without obstruction or gangrene: Secondary | ICD-10-CM | POA: Diagnosis not present

## 2019-01-28 DIAGNOSIS — K7290 Hepatic failure, unspecified without coma: Secondary | ICD-10-CM | POA: Diagnosis not present

## 2019-02-11 DIAGNOSIS — M6281 Muscle weakness (generalized): Secondary | ICD-10-CM | POA: Diagnosis not present

## 2019-02-11 DIAGNOSIS — S143XXS Injury of brachial plexus, sequela: Secondary | ICD-10-CM | POA: Diagnosis not present

## 2019-02-14 DIAGNOSIS — S143XXS Injury of brachial plexus, sequela: Secondary | ICD-10-CM | POA: Diagnosis not present

## 2019-02-14 DIAGNOSIS — M6281 Muscle weakness (generalized): Secondary | ICD-10-CM | POA: Diagnosis not present

## 2019-02-18 DIAGNOSIS — R188 Other ascites: Secondary | ICD-10-CM | POA: Diagnosis not present

## 2019-02-18 DIAGNOSIS — K7460 Unspecified cirrhosis of liver: Secondary | ICD-10-CM | POA: Diagnosis not present

## 2019-02-19 DIAGNOSIS — R188 Other ascites: Secondary | ICD-10-CM | POA: Diagnosis not present

## 2019-02-19 DIAGNOSIS — K7460 Unspecified cirrhosis of liver: Secondary | ICD-10-CM | POA: Diagnosis not present

## 2019-02-21 DIAGNOSIS — M6281 Muscle weakness (generalized): Secondary | ICD-10-CM | POA: Diagnosis not present

## 2019-02-21 DIAGNOSIS — S143XXS Injury of brachial plexus, sequela: Secondary | ICD-10-CM | POA: Diagnosis not present

## 2019-02-25 DIAGNOSIS — M6281 Muscle weakness (generalized): Secondary | ICD-10-CM | POA: Diagnosis not present

## 2019-02-25 DIAGNOSIS — S143XXS Injury of brachial plexus, sequela: Secondary | ICD-10-CM | POA: Diagnosis not present

## 2019-02-26 DIAGNOSIS — R569 Unspecified convulsions: Secondary | ICD-10-CM | POA: Diagnosis not present

## 2019-02-26 DIAGNOSIS — L03316 Cellulitis of umbilicus: Secondary | ICD-10-CM | POA: Diagnosis not present

## 2019-02-26 DIAGNOSIS — R188 Other ascites: Secondary | ICD-10-CM | POA: Diagnosis not present

## 2019-02-26 DIAGNOSIS — R29898 Other symptoms and signs involving the musculoskeletal system: Secondary | ICD-10-CM | POA: Diagnosis not present

## 2019-02-26 DIAGNOSIS — K429 Umbilical hernia without obstruction or gangrene: Secondary | ICD-10-CM | POA: Diagnosis not present

## 2019-02-26 DIAGNOSIS — L98492 Non-pressure chronic ulcer of skin of other sites with fat layer exposed: Secondary | ICD-10-CM | POA: Diagnosis not present

## 2019-02-26 DIAGNOSIS — G540 Brachial plexus disorders: Secondary | ICD-10-CM | POA: Diagnosis not present

## 2019-03-05 DIAGNOSIS — G540 Brachial plexus disorders: Secondary | ICD-10-CM | POA: Diagnosis not present

## 2019-03-05 DIAGNOSIS — K429 Umbilical hernia without obstruction or gangrene: Secondary | ICD-10-CM | POA: Diagnosis not present

## 2019-03-05 DIAGNOSIS — L03316 Cellulitis of umbilicus: Secondary | ICD-10-CM | POA: Diagnosis not present

## 2019-03-05 DIAGNOSIS — L98492 Non-pressure chronic ulcer of skin of other sites with fat layer exposed: Secondary | ICD-10-CM | POA: Diagnosis not present

## 2019-03-05 DIAGNOSIS — L98499 Non-pressure chronic ulcer of skin of other sites with unspecified severity: Secondary | ICD-10-CM | POA: Diagnosis not present

## 2019-03-05 DIAGNOSIS — R569 Unspecified convulsions: Secondary | ICD-10-CM | POA: Diagnosis not present

## 2019-03-25 ENCOUNTER — Ambulatory Visit: Payer: Medicare Other | Admitting: Internal Medicine

## 2019-03-25 DIAGNOSIS — Z0289 Encounter for other administrative examinations: Secondary | ICD-10-CM

## 2019-03-29 DIAGNOSIS — Z79899 Other long term (current) drug therapy: Secondary | ICD-10-CM | POA: Diagnosis not present

## 2019-03-29 DIAGNOSIS — R7989 Other specified abnormal findings of blood chemistry: Secondary | ICD-10-CM | POA: Diagnosis not present

## 2019-03-29 DIAGNOSIS — I10 Essential (primary) hypertension: Secondary | ICD-10-CM | POA: Diagnosis not present

## 2019-03-29 DIAGNOSIS — E876 Hypokalemia: Secondary | ICD-10-CM | POA: Diagnosis not present

## 2019-03-29 DIAGNOSIS — R569 Unspecified convulsions: Secondary | ICD-10-CM | POA: Diagnosis not present

## 2019-03-29 DIAGNOSIS — R000 Tachycardia, unspecified: Secondary | ICD-10-CM | POA: Diagnosis not present

## 2019-03-29 DIAGNOSIS — Z87891 Personal history of nicotine dependence: Secondary | ICD-10-CM | POA: Diagnosis not present

## 2019-03-29 DIAGNOSIS — E7220 Disorder of urea cycle metabolism, unspecified: Secondary | ICD-10-CM | POA: Diagnosis not present

## 2019-03-29 DIAGNOSIS — R404 Transient alteration of awareness: Secondary | ICD-10-CM | POA: Diagnosis not present

## 2019-03-30 DIAGNOSIS — R569 Unspecified convulsions: Secondary | ICD-10-CM | POA: Diagnosis not present

## 2019-04-16 ENCOUNTER — Other Ambulatory Visit: Payer: Self-pay

## 2019-04-16 ENCOUNTER — Encounter: Payer: Self-pay | Admitting: Internal Medicine

## 2019-04-16 ENCOUNTER — Ambulatory Visit (INDEPENDENT_AMBULATORY_CARE_PROVIDER_SITE_OTHER): Payer: Medicare Other | Admitting: Internal Medicine

## 2019-04-16 DIAGNOSIS — D696 Thrombocytopenia, unspecified: Secondary | ICD-10-CM | POA: Diagnosis not present

## 2019-04-16 DIAGNOSIS — R7302 Impaired glucose tolerance (oral): Secondary | ICD-10-CM | POA: Diagnosis not present

## 2019-04-16 DIAGNOSIS — R188 Other ascites: Secondary | ICD-10-CM | POA: Diagnosis not present

## 2019-04-16 NOTE — Patient Instructions (Signed)
You will be contacted regarding the referral for: Gastroenterology  Please continue all other medications as before, and refills have been done if requested.  Please have the pharmacy call with any other refills you may need.  Please continue your efforts at being more active, low cholesterol diet, and weight control.  Please keep your appointments with your specialists as you may have planned  Please return in 6 months, or sooner if needed

## 2019-04-16 NOTE — Progress Notes (Signed)
Subjective:    Patient ID: [name], male    DOB: 1966-05-27, 53 y.o.   MRN: [id number]  HPI  Here to f/u, s/p siezure and AMS, hospd in [place] for about 1 mo, then at [facility] rehab for 1 mo, then to assisted living until 1 wk ago.  Now at home.  Asks for GI consult locally as not sure how he can get to [place] as he has done in the past.  Has been getting repeated paracentesis.  Plans to see Dr Sherrian Divers for hands.  Pt denies fever, wt loss, night sweats, loss of appetite, or other constitutional symptoms S/p COVID infection in the NH about 6 wks ago,He only had fatigue as symptom, no fever, cough, sob.   BP Readings from Last 3 Encounters:  04/16/19 122/78  10/04/18 (!) 111/59  09/24/18 132/86   Wt Readings from Last 3 Encounters:  04/16/19 193 lb (87.5 kg)  10/04/18 193 lb 12.6 oz (87.9 kg)  09/24/18 192 lb (87.1 kg)   Past Medical History:  Diagnosis Date  . Abuse, drug or alcohol ([place])   . Anemia   . Bipolar affective disorder ([place])    [place]  . Cancer [facility])    liver cancer treated with micrablation  . Chronic liver disease and cirrhosis   . Depression   . DJD (degenerative joint disease)    right knee  . Dyslipidemia   . Encephalopathy   . Esophageal varices ([place])   . Hernia   . History of alcohol abuse   . PONV (postoperative nausea and vomiting)   . Seizures ([place])    Dr Jannifer Franklin  . Thrombocytopenia ([place])   . Wilson disease   . Wrist fracture    right   Past Surgical History:  Procedure Laterality Date  . ESOPHAGOGASTRODUODENOSCOPY    . HARDWARE REMOVAL Right 01/22/2015   Procedure: HARDWARE REMOVAL RIGHT LEG;  Surgeon: Leandrew Koyanagi, MD;  Location: [place];  Service: Orthopedics;  Laterality: Right;  . IR PARACENTESIS  10/04/2018  . left inguinal hernia  june 2012   [place]  . liver cancer  02/2015   ablation  . microablation of liver    . OPEN REDUCTION INTERNAL FIXATION (ORIF) DISTAL RADIAL FRACTURE Right 12/21/2016   Procedure: OPEN REDUCTION INTERNAL FIXATION (ORIF) RIGHT DISTAL RADIUS FRACTURE;  Surgeon: Leandrew Koyanagi, MD;  Location: [place];  Service: Orthopedics;  Laterality: Right;  . OPEN REDUCTION INTERNAL FIXATION (ORIF) TIBIA/FIBULA FRACTURE Right 07/25/2013   Procedure: OPEN REDUCTION INTERNAL FIXATION (ORIF) RIGHT TIBIAL PLATEAU, TIBIAL SHAFT AND FIBULA FRACTURES, POSSIBLE FASCIOTOMIES;  Surgeon: Marianna Payment, MD;  Location: [place];  Service: Orthopedics;  Laterality: Right;  . TOTAL KNEE ARTHROPLASTY Right 07/15/2015   Procedure: RIGHT TOTAL KNEE ARTHROPLASTY;  Surgeon: Leandrew Koyanagi, MD;  Location: [place];  Service: Orthopedics;  Laterality: Right;  [name] VASCULAR SURGERY      reports that he has been smoking cigarettes. He has been smoking about 0.25 packs per day. He has never used smokeless tobacco. He reports that he does not drink alcohol or use drugs. family history includes Dementia in his mother; Diabetes in his father; Glaucoma in his brother; Heart disease in an other family member. Allergies  Allergen Reactions  . Codeine Nausea And Vomiting    Pt unsure if this is correct.   Current Outpatient Medications on File Prior to Visit  Medication Sig Dispense Refill  . cholecalciferol (VITAMIN D)  1000 units tablet Take 1,000 Units by mouth daily.    . cyclobenzaprine (FLEXERIL) 5 MG tablet Take 5 mg by mouth at bedtime.     . folic acid (FOLVITE) 1 MG tablet Take 1 mg by mouth daily.     . furosemide (LASIX) 40 MG tablet Take 2 tablets (80 mg total) by mouth 2 (two) times daily. 120 tablet 11  . levETIRAcetam (KEPPRA) 1000 MG tablet Take 1 tablet (1,000 mg total) by mouth 2 (two) times daily. (Patient taking differently: Take 1,500 mg by mouth 2 (two) times daily. ) 180 tablet 3  . potassium chloride (K-DUR) 10 MEQ tablet TAKE 1 TABLET BY MOUTH TWICE DAILY 180 tablet 1  . spironolactone (ALDACTONE) 100 MG tablet Take 200 mg by mouth every morning.     [name] XIFAXAN 550 MG TABS tablet Take 550 mg  by mouth 3 (three) times daily.     [name] zinc gluconate 50 MG tablet Take 50 mg by mouth 3 (three) times daily.      No current facility-administered medications on file prior to visit.    Review of Systems  Constitutional: Negative for other unusual diaphoresis or sweats HENT: Negative for ear discharge or swelling Eyes: Negative for other worsening visual disturbances Respiratory: Negative for stridor or other swelling  Gastrointestinal: Negative for worsening distension or other blood Genitourinary: Negative for retention or other urinary change Musculoskeletal: Negative for other MSK pain or swelling Skin: Negative for color change or other new lesions Neurological: Negative for worsening tremors and other numbness  Psychiatric/Behavioral: Negative for worsening agitation or other fatigue All other system neg per pt    Objective:   Physical Exam BP 122/78   Pulse 78   Temp 98.5 F (36.9 C) (Oral)   Ht 6' (1.829 m)   Wt 193 lb (87.5 kg)   SpO2 98%   BMI 26.18 kg/m  VS noted,  Constitutional: Pt appears in NAD HENT: Head: NCAT.  Right Ear: External ear normal.  Left Ear: External ear normal.  Eyes: . Pupils are equal, round, and reactive to light. Conjunctivae and EOM are normal Nose: without d/c or deformity Neck: Neck supple. Gross normal ROM Cardiovascular: Normal rate and regular rhythm.   Pulmonary/Chest: Effort normal and breath sounds without rales or wheezing.  Abd:  Soft, NT, ND, + BS, no organomegaly, + ascites Neurological: Pt is alert. At baseline orientation, motor grossly intact Skin: Skin is warm. No rashes, other new lesions, no LE edema Psychiatric: Pt behavior is normal without agitation  No other exam findings Lab Results  Component Value Date   WBC 5.1 10/04/2018   HGB 11.9 (L) 10/04/2018   HCT 37.6 (L) 10/04/2018   PLT  10/04/2018    PLATELET CLUMPS NOTED ON SMEAR, COUNT APPEARS DECREASED   GLUCOSE 106 (H) 10/04/2018   CHOL 141 06/07/2016    TRIG 86.0 06/07/2016   HDL 42.70 06/07/2016   LDLDIRECT 117.8 01/07/2013   LDLCALC 81 06/07/2016   ALT 73 (H) 10/04/2018   AST 105 (H) 10/04/2018   NA 133 (L) 10/04/2018   K 4.6 10/04/2018   CL 103 10/04/2018   CREATININE 0.83 10/04/2018   BUN 14 10/04/2018   CO2 21 (L) 10/04/2018   TSH 1.01 06/07/2016   PSA 0.11 06/07/2016   INR 1.49 01/15/2018   HGBA1C 4.4 (L) 01/07/2013      Assessment & Plan:

## 2019-04-19 ENCOUNTER — Encounter: Payer: Self-pay | Admitting: Internal Medicine

## 2019-04-19 NOTE — Assessment & Plan Note (Signed)
No overt bleeding, declines lab f/u today

## 2019-04-19 NOTE — Assessment & Plan Note (Signed)
[place] for local GI referral,  to f/u any worsening symptoms or concerns

## 2019-04-19 NOTE — Assessment & Plan Note (Signed)
stable overall by history and exam, recent data reviewed with pt, and pt to continue medical treatment as before,  to f/u any worsening symptoms or concerns  

## 2019-04-25 ENCOUNTER — Other Ambulatory Visit (HOSPITAL_COMMUNITY): Payer: Self-pay | Admitting: Internal Medicine

## 2019-04-25 ENCOUNTER — Other Ambulatory Visit: Payer: Self-pay | Admitting: Internal Medicine

## 2019-04-25 DIAGNOSIS — R188 Other ascites: Secondary | ICD-10-CM

## 2019-04-29 ENCOUNTER — Encounter (HOSPITAL_COMMUNITY): Payer: Self-pay | Admitting: Physician Assistant

## 2019-04-29 ENCOUNTER — Ambulatory Visit (HOSPITAL_COMMUNITY)
Admission: RE | Admit: 2019-04-29 | Discharge: 2019-04-29 | Disposition: A | Discharge status: Home / Self Care | Payer: Medicare Other | Source: Ambulatory Visit | Attending: Internal Medicine | Admitting: Internal Medicine

## 2019-04-29 ENCOUNTER — Other Ambulatory Visit: Payer: Self-pay

## 2019-04-29 ENCOUNTER — Other Ambulatory Visit: Payer: Self-pay | Admitting: Internal Medicine

## 2019-04-29 DIAGNOSIS — R188 Other ascites: Secondary | ICD-10-CM | POA: Diagnosis not present

## 2019-04-29 HISTORY — PX: IR PARACENTESIS: IMG2679

## 2019-04-29 LAB — PROTEIN, PLEURAL OR PERITONEAL FLUID: Total protein, fluid: <3 g/dL

## 2019-04-29 MED ORDER — LIDOCAINE HCL (PF) 1 % IJ SOLN
INTRAMUSCULAR | Status: AC | PRN
  Administered 2019-04-29: 10 mL

## 2019-04-29 MED ORDER — FUROSEMIDE 40 MG PO TABS: 80.0000 mg | ORAL_TABLET | Freq: Two times a day (BID) | ORAL | 5 refills | Status: AC

## 2019-04-29 MED ORDER — FOLIC ACID 1 MG PO TABS: 1.0000 mg | ORAL_TABLET | Freq: Every day | ORAL | 1 refills | Status: DC

## 2019-04-29 MED ORDER — SPIRONOLACTONE 100 MG PO TABS: 200.0000 mg | ORAL_TABLET | Freq: Every morning | ORAL | 1 refills | Status: AC

## 2019-04-29 MED ORDER — CYCLOBENZAPRINE HCL 5 MG PO TABS: 5.0000 mg | ORAL_TABLET | Freq: Every day | ORAL | 2 refills | Status: DC

## 2019-04-29 MED ORDER — LIDOCAINE HCL 1 % IJ SOLN
INTRAMUSCULAR | Status: AC
  Filled 2019-04-29: qty 20

## 2019-04-29 MED ORDER — POTASSIUM CHLORIDE ER 10 MEQ PO TBCR: 10.0000 meq | EXTENDED_RELEASE_TABLET | Freq: Two times a day (BID) | ORAL | 1 refills | Status: DC

## 2019-04-29 MED ORDER — LEVETIRACETAM 1000 MG PO TABS: 1000.0000 mg | ORAL_TABLET | Freq: Two times a day (BID) | ORAL | 3 refills | Status: DC

## 2019-04-29 MED ORDER — XIFAXAN 550 MG PO TABS: 550.0000 mg | ORAL_TABLET | Freq: Three times a day (TID) | ORAL | 5 refills | Status: DC

## 2019-04-29 NOTE — Telephone Encounter (Signed)
Requested medication (s) are due for refill today: yes  Requested medication (s) are on the active medication list: yes  Last refill:    Future visit scheduled:Yes  Notes to clinic:  This refill cannot be delegated. Review for refill  Requested Prescriptions  Pending Prescriptions Disp Refills   cyclobenzaprine (FLEXERIL) 5 MG tablet 30 tablet     Sig: Take 1 tablet (5 mg total) by mouth at bedtime.     Not Delegated - Analgesics:  Muscle Relaxants Failed - 04/29/2019 10:50 AM      Failed - This refill cannot be delegated      Passed - Valid encounter within last 6 months    Recent Outpatient Visits          1 week ago Other ascites   [place] John, James W, MD   7 months ago Preventative health care   [place] [facility] -Georges Mouse, MD   1 year ago Left inguinal hernia   [place] John, James W, MD   2 years ago Bipolar affective disorder, current episode mixed, current episode severity unspecified [facility])   [place] John, James W, MD   2 years ago Bipolar affective disorder, current episode mixed, current episode severity unspecified [facility])   [place], James W, MD      Future Appointments            In 5 months Biagio Borg, MD [place] [facility]            folic acid (FOLVITE) 1 MG tablet      Sig: Take 1 tablet (1 mg total) by mouth daily.     Endocrinology:  Vitamins Passed - 04/29/2019 10:50 AM      Passed - Valid encounter within last 12 months    Recent Outpatient Visits          1 week ago Other ascites   [place] John, Hunt Oris, MD   7 months ago Preventative health care   [facility] -Junie Bame, Hunt Oris, MD   1 year ago Left inguinal hernia   [place] John, James W, MD   2 years ago Bipolar affective disorder,  current episode mixed, current episode severity unspecified [facility])   [place] John, James W, MD   2 years ago Bipolar affective disorder, current episode mixed, current episode severity unspecified [facility])   [place], James W, MD      Future Appointments            In 5 months Jenny Reichmann Hunt Oris, MD [place] [facility]            furosemide (LASIX) 40 MG tablet 120 tablet 11    Sig: Take 2 tablets (80 mg total) by mouth 2 (two) times daily.     Cardiovascular:  Diuretics - Loop Failed - 04/29/2019 10:50 AM      Failed - Na in normal range and within 360 days    Sodium  Date Value Ref Range Status  10/04/2018 133 (L) 135 - 145 mmol/L Final         Passed - K in normal range and within 360 days    Potassium  Date Value Ref Range Status  10/04/2018 4.6 3.5 - 5.1 mmol/L Final  Passed - Ca in normal range and within 360 days    Calcium  Date Value Ref Range Status  10/04/2018 9.6 8.9 - 10.3 mg/dL Final         Passed - Cr in normal range and within 360 days    Creatinine, Ser  Date Value Ref Range Status  10/04/2018 0.83 0.61 - 1.24 mg/dL Final         Passed - Last BP in normal range    BP Readings from Last 1 Encounters:  04/16/19 122/78         Passed - Valid encounter within last 6 months    Recent Outpatient Visits          1 week ago Other ascites   [place] John, Hunt Oris, MD   7 months ago Preventative health care   [facility] -[place]ie Bame, Hunt Oris, MD   1 year ago Left inguinal hernia   [place] John, James W, MD   2 years ago Bipolar affective disorder, current episode mixed, current episode severity unspecified [facility])   [place] John, James W, MD   2 years ago Bipolar affective disorder, current episode mixed, current episode severity unspecified [facility])   [place], James W, MD      Future Appointments            In 5 months Jenny Reichmann Hunt Oris, MD [place]            levETIRAcetam (KEPPRA) 1000 MG tablet 180 tablet 3    Sig: Take 1 tablet (1,000 mg total) by mouth 2 (two) times daily.     Not Delegated - Neurology:  Anticonvulsants Failed - 04/29/2019 10:50 AM      Failed - This refill cannot be delegated      Failed - HCT in normal range and within 360 days    HCT  Date Value Ref Range Status  10/04/2018 37.6 (L) 39.0 - 52.0 % Final         Failed - HGB in normal range and within 360 days    Hemoglobin  Date Value Ref Range Status  10/04/2018 11.9 (L) 13.0 - 17.0 g/dL Final         Passed - PLT in normal range and within 360 days    Platelets  Date Value Ref Range Status  10/04/2018  150 - 400 K/uL Final   PLATELET CLUMPS NOTED ON SMEAR, COUNT APPEARS DECREASED    Comment:    Immature Platelet Fraction may be clinically indicated, consider ordering this additional test [id number]          Passed - WBC in normal range and within 360 days    WBC  Date Value Ref Range Status  10/04/2018 5.1 4.0 - 10.5 K/uL Final         Passed - Valid encounter within last 12 months    Recent Outpatient Visits          1 week ago Other ascites   [place], James W, MD   7 months ago Preventative health care   [facility] -Georges Mouse, MD   1 year ago Left inguinal hernia   [place] [facility] -Georges Mouse, MD   2 years ago Bipolar affective disorder, current episode mixed, current episode severity unspecified ([place])  [place] John, James W, MD   2 years ago Bipolar affective disorder, current episode mixed, current episode severity unspecified [facility])   [place] [facility] -Georges Mouse, MD      Future Appointments            In 5 months Jenny Reichmann Hunt Oris, MD [place] [facility]            potassium chloride (K-DUR) 10 MEQ tablet 180 tablet 1    Sig: Take 1 tablet (10 mEq total) by mouth 2 (two) times daily.     Endocrinology:  Minerals - Potassium Supplementation Passed - 04/29/2019 10:50 AM      Passed - K in normal range and within 360 days    Potassium  Date Value Ref Range Status  10/04/2018 4.6 3.5 - 5.1 mmol/L Final         Passed - Cr in normal range and within 360 days    Creatinine, Ser  Date Value Ref Range Status  10/04/2018 0.83 0.61 - 1.24 mg/dL Final         Passed - Valid encounter within last 12 months    Recent Outpatient Visits          1 week ago Other ascites   [place] [facility] -Georges Mouse, MD   7 months ago Preventative health care   [facility] -Georges Mouse, MD   1 year ago Left inguinal hernia   [place] John, James W, MD   2 years ago Bipolar affective disorder, current episode mixed, current episode severity unspecified [facility])   [place] John, James W, MD   2 years ago Bipolar affective disorder, current episode mixed, current episode severity unspecified [facility])   [place], James W, MD      Future Appointments            In 5 months Biagio Borg, MD [place]            spironolactone (ALDACTONE) 100 MG tablet      Sig: Take 2 tablets (200 mg total) by mouth every morning.     Cardiovascular: Diuretics - Aldosterone Antagonist Failed - 04/29/2019 10:50 AM      Failed - Na in normal range and within 360 days    Sodium  Date Value Ref Range Status  10/04/2018 133 (L) 135 - 145 mmol/L Final         Passed - Cr in normal range and within 360 days    Creatinine, Ser  Date Value Ref Range Status  10/04/2018 0.83 0.61 - 1.24 mg/dL Final         Passed - K in normal range and within 360 days     Potassium  Date Value Ref Range Status  10/04/2018 4.6 3.5 - 5.1 mmol/L Final         Passed - Last BP in normal range    BP Readings from Last 1 Encounters:  04/16/19 122/78         Passed - Valid encounter within last 6 months    Recent Outpatient Visits          1 week ago Other ascites   [place], MD   7 months ago Preventative health care   [employer]  [facility] -Georges Mouse, MD   1 year ago Left inguinal hernia   [place] John, James W, MD   2 years ago Bipolar affective disorder, current episode mixed, current episode severity unspecified [facility])   [place] John, James W, MD   2 years ago Bipolar affective disorder, current episode mixed, current episode severity unspecified [facility])   [place], James W, MD      Future Appointments            In 5 months Biagio Borg, MD [place] [facility]            XIFAXAN 550 MG TABS tablet 42 tablet     Sig: Take 1 tablet (550 mg total) by mouth 3 (three) times daily.     Off-Protocol Failed - 04/29/2019 10:50 AM      Failed - Medication not assigned to a protocol, review manually.      Passed - Valid encounter within last 12 months    Recent Outpatient Visits          1 week ago Other ascites   [place] John, James W, MD   7 months ago Preventative health care   [facility] -Georges Mouse, MD   1 year ago Left inguinal hernia   [place] John, James W, MD   2 years ago Bipolar affective disorder, current episode mixed, current episode severity unspecified [facility])   [place] John, James W, MD   2 years ago Bipolar affective disorder, current episode mixed, current episode severity unspecified [facility])   [place] [facility] -Georges Mouse, MD      Future Appointments            In 5 months Jenny Reichmann, Hunt Oris, MD [place], [facility]

## 2019-04-29 NOTE — Procedures (Signed)
PROCEDURE SUMMARY:  Successful US guided paracentesis from left lateral abdomen.  Yielded 3 liters of hazy yellow fluid.  No immediate complications.  Patient tolerated well.  EBL = trace  Specimen was sent for labs.  Drury Ardizzone S Rayli Wiederhold PA-C 04/29/2019 2:55 PM

## 2019-04-29 NOTE — Telephone Encounter (Signed)
Medication Refill - Medication: cyclobenzaprine (FLEXERIL) 5 MG tablet , furosemide (LASIX) 40 MG tablet , levETIRAcetam (KEPPRA) 1000 MG tablet , hydroxyzine pamoate (25 mg),  spironolactone (ALDACTONE) 100 MG tablet, XIFAXAN 550 MG TABS tablet, potassium chloride (K-DUR) 10 MEQ tablet, folic acid (FOLVITE) 1 MG tablet    Has the patient contacted their pharmacy? Yes.    (Agent: If no, request that the patient contact the pharmacy for the refill.) (Agent: If yes, when and what did the pharmacy advise?)  Preferred Pharmacy (with phone number or street name):  [facility] [place] [place] [facility] [address]  [place] [address]  Phone: [phone] Fax: [phone]  Not a 24 hour pharmacy; exact hours not known.     Agent: Please be advised that RX refills may take up to 3 business days. We ask that you follow-up with your pharmacy.

## 2019-04-30 ENCOUNTER — Other Ambulatory Visit (HOSPITAL_COMMUNITY): Payer: Self-pay | Admitting: Neurology

## 2019-06-10 ENCOUNTER — Encounter: Payer: Self-pay | Admitting: Internal Medicine

## 2019-06-10 ENCOUNTER — Ambulatory Visit (INDEPENDENT_AMBULATORY_CARE_PROVIDER_SITE_OTHER): Payer: Medicare Other | Admitting: Internal Medicine

## 2019-06-10 DIAGNOSIS — Z0000 Encounter for general adult medical examination without abnormal findings: Secondary | ICD-10-CM

## 2019-06-10 NOTE — Progress Notes (Signed)
Patient ID: [name], male   DOB: [birth year], 53 y.o.   MRN: [id number]  Virtual Visit via Video Note  I connected with [name] on 06/10/19 at  3:20 PM EDT by a video enabled telemedicine application and verified that I am speaking with the correct person using two identifiers.  Location: Patient: at home Provider: at officw   I discussed the limitations of evaluation and management by telemedicine and the availability of in person appointments. The patient expressed understanding and agreed to proceed.  History of Present Illness: Here for wellness and f/u;  Overall doing ok;  Pt denies Chest pain, worsening SOB, DOE, wheezing, orthopnea, PND, worsening LE edema, palpitations, dizziness or syncope.  Pt denies neurological change such as new headache, facial or extremity weakness.  Pt denies polydipsia, polyuria, or low sugar symptoms. Pt states overall good compliance with treatment and medications, good tolerability, and has been trying to follow appropriate diet.  Pt denies worsening depressive symptoms, suicidal ideation or panic. No fever, night sweats, wt loss, loss of appetite, or other constitutional symptoms.  Pt states good ability with ADL's, has low fall risk, home safety reviewed and adequate, no other significant changes in hearing or vision, and only occasionally active with exercise.  Saw GI yesterday an d needs labs done Past Medical History:  Diagnosis Date  . Abuse, drug or alcohol ([place])   . Anemia   . Bipolar affective disorder ([place])    [place]  . Cancer [facility])    liver cancer treated with micrablation  . Chronic liver disease and cirrhosis   . Depression   . DJD (degenerative joint disease)    right knee  . Dyslipidemia   . Encephalopathy   . Esophageal varices ([place])   . Hernia   . History of alcohol abuse   . PONV (postoperative nausea and vomiting)   . Seizures ([place])    Dr Jannifer Franklin  . Thrombocytopenia ([place])   . Wilson  disease   . Wrist fracture    right   Past Surgical History:  Procedure Laterality Date  . ESOPHAGOGASTRODUODENOSCOPY    . HARDWARE REMOVAL Right 01/22/2015   Procedure: HARDWARE REMOVAL RIGHT LEG;  Surgeon: Leandrew Koyanagi, MD;  Location: [place];  Service: Orthopedics;  Laterality: Right;  . IR PARACENTESIS  10/04/2018  . IR PARACENTESIS  04/29/2019  . left inguinal hernia  june 2012   [place]  . liver cancer  02/2015   ablation  . microablation of liver    . OPEN REDUCTION INTERNAL FIXATION (ORIF) DISTAL RADIAL FRACTURE Right 12/21/2016   Procedure: OPEN REDUCTION INTERNAL FIXATION (ORIF) RIGHT DISTAL RADIUS FRACTURE;  Surgeon: Leandrew Koyanagi, MD;  Location: [place];  Service: Orthopedics;  Laterality: Right;  . OPEN REDUCTION INTERNAL FIXATION (ORIF) TIBIA/FIBULA FRACTURE Right 07/25/2013   Procedure: OPEN REDUCTION INTERNAL FIXATION (ORIF) RIGHT TIBIAL PLATEAU, TIBIAL SHAFT AND FIBULA FRACTURES, POSSIBLE FASCIOTOMIES;  Surgeon: Marianna Payment, MD;  Location: [place];  Service: Orthopedics;  Laterality: Right;  . TOTAL KNEE ARTHROPLASTY Right 07/15/2015   Procedure: RIGHT TOTAL KNEE ARTHROPLASTY;  Surgeon: Leandrew Koyanagi, MD;  Location: [place];  Service: Orthopedics;  Laterality: Right;  [name] VASCULAR SURGERY      reports that he has been smoking cigarettes. He has been smoking about 0.25 packs per day. He has never used smokeless tobacco. He reports that he does not drink alcohol or use drugs. family history includes Dementia in his mother;  Diabetes in his father; Glaucoma in his brother; Heart disease in an other family member. Allergies  Allergen Reactions  . Codeine Nausea And Vomiting    Pt unsure if this is correct.   Observations/Objective: Alert, NAD, appropriate mood and affect, resps normal, cn 2-12 intact, moves all 4s, no visible rash or swelling Lab Results  Component Value Date   WBC 5.1 10/04/2018   HGB 11.9 (L) 10/04/2018   HCT 37.6 (L) 10/04/2018   PLT  10/04/2018     PLATELET CLUMPS NOTED ON SMEAR, COUNT APPEARS DECREASED   GLUCOSE 106 (H) 10/04/2018   CHOL 141 06/07/2016   TRIG 86.0 06/07/2016   HDL 42.70 06/07/2016   LDLDIRECT 117.8 01/07/2013   LDLCALC 81 06/07/2016   ALT 73 (H) 10/04/2018   AST 105 (H) 10/04/2018   NA 133 (L) 10/04/2018   K 4.6 10/04/2018   CL 103 10/04/2018   CREATININE 0.83 10/04/2018   BUN 14 10/04/2018   CO2 21 (L) 10/04/2018   TSH 1.01 06/07/2016   PSA 0.11 06/07/2016   INR 1.49 01/15/2018   HGBA1C 4.4 (L) 01/07/2013   Assessment and Plan: See notes  Follow Up Instructions: See notes   I discussed the assessment and treatment plan with the patient. The patient was provided an opportunity to ask questions and all were answered. The patient agreed with the plan and demonstrated an understanding of the instructions.   The patient was advised to call back or seek an in-person evaluation if the symptoms worsen or if the condition fails to improve as anticipated.   Cathlean Cower, MD

## 2019-06-10 NOTE — Assessment & Plan Note (Signed)
For labs as ordered 

## 2019-06-10 NOTE — Assessment & Plan Note (Signed)

## 2019-06-10 NOTE — Patient Instructions (Signed)
Please continue all other medications as before, and refills have been done if requested.  Please have the pharmacy call with any other refills you may need.  Please continue your efforts at being more active, low cholesterol diet, and weight control.  You are otherwise up to date with prevention measures today.  Please keep your appointments with your specialists as you may have planned  Please go to the LAB in the Basement (turn left off the elevator) for the tests to be done   You will be contacted by phone if any changes need to be made immediately.  Otherwise, you will receive a letter about your results with an explanation, but please check with MyChart first.  Please remember to sign up for MyChart if you have not done so, as this will be important to you in the future with finding out test results, communicating by private email, and scheduling acute appointments online when needed.

## 2019-06-11 ENCOUNTER — Other Ambulatory Visit (INDEPENDENT_AMBULATORY_CARE_PROVIDER_SITE_OTHER): Payer: Medicare Other

## 2019-06-11 DIAGNOSIS — Z0000 Encounter for general adult medical examination without abnormal findings: Secondary | ICD-10-CM

## 2019-06-11 LAB — CBC WITH DIFFERENTIAL/PLATELET
Basophils Absolute: 0 10*3/uL (ref 0.0–0.1)
Basophils Relative: 0.3 % (ref 0.0–3.0)
Eosinophils Absolute: 0.1 10*3/uL (ref 0.0–0.7)
Eosinophils Relative: 1.8 % (ref 0.0–5.0)
HCT: 39.5 % (ref 39.0–52.0)
Hemoglobin: 13.3 g/dL (ref 13.0–17.0)
Lymphocytes Relative: 12 % (ref 12.0–46.0)
Lymphs Abs: 0.6 10*3/uL — ABNORMAL LOW (ref 0.7–4.0)
MCHC: 33.6 g/dL (ref 30.0–36.0)
MCV: 99.5 fl (ref 78.0–100.0)
Monocytes Absolute: 0.6 10*3/uL (ref 0.1–1.0)
Monocytes Relative: 11.9 % (ref 3.0–12.0)
Neutro Abs: 3.9 10*3/uL (ref 1.4–7.7)
Neutrophils Relative %: 74 % (ref 43.0–77.0)
Platelets: 82 10*3/uL — ABNORMAL LOW (ref 150.0–400.0)
RBC: 3.97 Mil/uL — ABNORMAL LOW (ref 4.22–5.81)
RDW: 16.2 % — ABNORMAL HIGH (ref 11.5–15.5)
WBC: 5.3 10*3/uL (ref 4.0–10.5)

## 2019-06-11 LAB — LIPID PANEL
Cholesterol: 136 mg/dL (ref 0–200)
HDL: 37.7 mg/dL — ABNORMAL LOW (ref >39.00)
LDL Cholesterol: 84 mg/dL (ref 0–99)
NonHDL: 97.99
Total CHOL/HDL Ratio: 4
Triglycerides: 69 mg/dL (ref 0.0–149.0)
VLDL: 13.8 mg/dL (ref 0.0–40.0)

## 2019-06-11 LAB — BASIC METABOLIC PANEL
BUN: 11 mg/dL (ref 6–23)
CO2: 26 mEq/L (ref 19–32)
Calcium: 8.9 mg/dL (ref 8.4–10.5)
Chloride: 102 mEq/L (ref 96–112)
Creatinine, Ser: 0.71 mg/dL (ref 0.40–1.50)
GFR: 115.12 mL/min (ref >60.00)
Glucose, Bld: 108 mg/dL — ABNORMAL HIGH (ref 70–99)
Potassium: 3.9 mEq/L (ref 3.5–5.1)
Sodium: 133 mEq/L — ABNORMAL LOW (ref 135–145)

## 2019-06-11 LAB — URINALYSIS, ROUTINE W REFLEX MICROSCOPIC
Bilirubin Urine: NEGATIVE
Hgb urine dipstick: NEGATIVE
Ketones, ur: NEGATIVE
Leukocytes,Ua: NEGATIVE
Nitrite: NEGATIVE
Specific Gravity, Urine: 1.02 (ref 1.000–1.030)
Total Protein, Urine: NEGATIVE
Urine Glucose: NEGATIVE
Urobilinogen, UA: 2 — AB (ref 0.0–1.0)
pH: 6 (ref 5.0–8.0)

## 2019-06-11 LAB — HEPATIC FUNCTION PANEL
ALT: 59 U/L — ABNORMAL HIGH (ref 0–53)
AST: 66 U/L — ABNORMAL HIGH (ref 0–37)
Albumin: 2.9 g/dL — ABNORMAL LOW (ref 3.5–5.2)
Alkaline Phosphatase: 172 U/L — ABNORMAL HIGH (ref 39–117)
Bilirubin, Direct: 0.6 mg/dL — ABNORMAL HIGH (ref 0.0–0.3)
Total Bilirubin: 2.5 mg/dL — ABNORMAL HIGH (ref 0.2–1.2)
Total Protein: 6 g/dL (ref 6.0–8.3)

## 2019-06-11 LAB — PROTIME-INR
INR: 1.4 ratio — ABNORMAL HIGH (ref 0.8–1.0)
Prothrombin Time: 16 s — ABNORMAL HIGH (ref 9.6–13.1)

## 2019-06-11 LAB — MAGNESIUM: Magnesium: 1.8 mg/dL (ref 1.5–2.5)

## 2019-06-11 LAB — TSH: TSH: 1.9 u[IU]/mL (ref 0.35–4.50)

## 2019-06-11 LAB — PSA: PSA: 0.02 ng/mL — ABNORMAL LOW (ref 0.10–4.00)

## 2019-06-12 LAB — AFP TUMOR MARKER: AFP-Tumor Marker: 3.4 ng/mL (ref <6.1)

## 2019-07-21 ENCOUNTER — Emergency Department (HOSPITAL_COMMUNITY): Payer: Medicare Other

## 2019-07-21 ENCOUNTER — Other Ambulatory Visit: Payer: Self-pay

## 2019-07-21 ENCOUNTER — Inpatient Hospital Stay (HOSPITAL_COMMUNITY)
Admission: EM | Admit: 2019-07-21 | Discharge: 2019-07-29 | DRG: 522 | Disposition: A | Discharge status: Skilled Nursing Facility | Payer: Medicare Other | Attending: Internal Medicine | Admitting: Internal Medicine

## 2019-07-21 DIAGNOSIS — Z79899 Other long term (current) drug therapy: Secondary | ICD-10-CM

## 2019-07-21 DIAGNOSIS — S52612A Displaced fracture of left ulna styloid process, initial encounter for closed fracture: Secondary | ICD-10-CM | POA: Diagnosis present

## 2019-07-21 DIAGNOSIS — M25572 Pain in left ankle and joints of left foot: Secondary | ICD-10-CM

## 2019-07-21 DIAGNOSIS — S52502A Unspecified fracture of the lower end of left radius, initial encounter for closed fracture: Secondary | ICD-10-CM

## 2019-07-21 DIAGNOSIS — F17210 Nicotine dependence, cigarettes, uncomplicated: Secondary | ICD-10-CM | POA: Diagnosis present

## 2019-07-21 DIAGNOSIS — E8809 Other disorders of plasma-protein metabolism, not elsewhere classified: Secondary | ICD-10-CM

## 2019-07-21 DIAGNOSIS — S2232XA Fracture of one rib, left side, initial encounter for closed fracture: Secondary | ICD-10-CM | POA: Diagnosis present

## 2019-07-21 DIAGNOSIS — K439 Ventral hernia without obstruction or gangrene: Secondary | ICD-10-CM | POA: Diagnosis present

## 2019-07-21 DIAGNOSIS — Z8505 Personal history of malignant neoplasm of liver: Secondary | ICD-10-CM

## 2019-07-21 DIAGNOSIS — Z9114 Patient's other noncompliance with medication regimen: Secondary | ICD-10-CM

## 2019-07-21 DIAGNOSIS — G40909 Epilepsy, unspecified, not intractable, without status epilepticus: Secondary | ICD-10-CM

## 2019-07-21 DIAGNOSIS — F1021 Alcohol dependence, in remission: Secondary | ICD-10-CM | POA: Diagnosis present

## 2019-07-21 DIAGNOSIS — D696 Thrombocytopenia, unspecified: Secondary | ICD-10-CM | POA: Diagnosis present

## 2019-07-21 DIAGNOSIS — E7220 Disorder of urea cycle metabolism, unspecified: Secondary | ICD-10-CM

## 2019-07-21 DIAGNOSIS — Z419 Encounter for procedure for purposes other than remedying health state, unspecified: Secondary | ICD-10-CM

## 2019-07-21 DIAGNOSIS — M810 Age-related osteoporosis without current pathological fracture: Secondary | ICD-10-CM | POA: Diagnosis present

## 2019-07-21 DIAGNOSIS — D689 Coagulation defect, unspecified: Secondary | ICD-10-CM

## 2019-07-21 DIAGNOSIS — Z885 Allergy status to narcotic agent status: Secondary | ICD-10-CM

## 2019-07-21 DIAGNOSIS — Z96649 Presence of unspecified artificial hip joint: Secondary | ICD-10-CM

## 2019-07-21 DIAGNOSIS — S62102A Fracture of unspecified carpal bone, left wrist, initial encounter for closed fracture: Secondary | ICD-10-CM | POA: Diagnosis present

## 2019-07-21 DIAGNOSIS — S2239XA Fracture of one rib, unspecified side, initial encounter for closed fracture: Secondary | ICD-10-CM | POA: Diagnosis present

## 2019-07-21 DIAGNOSIS — Z833 Family history of diabetes mellitus: Secondary | ICD-10-CM

## 2019-07-21 DIAGNOSIS — D649 Anemia, unspecified: Secondary | ICD-10-CM | POA: Diagnosis present

## 2019-07-21 DIAGNOSIS — Z20828 Contact with and (suspected) exposure to other viral communicable diseases: Secondary | ICD-10-CM | POA: Diagnosis present

## 2019-07-21 DIAGNOSIS — F1011 Alcohol abuse, in remission: Secondary | ICD-10-CM | POA: Diagnosis present

## 2019-07-21 DIAGNOSIS — S52602A Unspecified fracture of lower end of left ulna, initial encounter for closed fracture: Secondary | ICD-10-CM

## 2019-07-21 DIAGNOSIS — S72002A Fracture of unspecified part of neck of left femur, initial encounter for closed fracture: Secondary | ICD-10-CM | POA: Diagnosis not present

## 2019-07-21 DIAGNOSIS — D6959 Other secondary thrombocytopenia: Secondary | ICD-10-CM | POA: Diagnosis present

## 2019-07-21 DIAGNOSIS — Z96651 Presence of right artificial knee joint: Secondary | ICD-10-CM | POA: Diagnosis present

## 2019-07-21 DIAGNOSIS — R569 Unspecified convulsions: Secondary | ICD-10-CM

## 2019-07-21 DIAGNOSIS — W19XXXA Unspecified fall, initial encounter: Secondary | ICD-10-CM | POA: Diagnosis present

## 2019-07-21 DIAGNOSIS — F319 Bipolar disorder, unspecified: Secondary | ICD-10-CM | POA: Diagnosis present

## 2019-07-21 DIAGNOSIS — K7031 Alcoholic cirrhosis of liver with ascites: Secondary | ICD-10-CM

## 2019-07-21 DIAGNOSIS — E876 Hypokalemia: Secondary | ICD-10-CM | POA: Diagnosis present

## 2019-07-21 LAB — CBC
HCT: 31.7 % — ABNORMAL LOW (ref 39.0–52.0)
Hemoglobin: 10.6 g/dL — ABNORMAL LOW (ref 13.0–17.0)
MCH: 33.9 pg (ref 26.0–34.0)
MCHC: 33.4 g/dL (ref 30.0–36.0)
MCV: 101.3 fL — ABNORMAL HIGH (ref 80.0–100.0)
Platelets: 53 10*3/uL — ABNORMAL LOW (ref 150–400)
RBC: 3.13 MIL/uL — ABNORMAL LOW (ref 4.22–5.81)
RDW: 15.7 % — ABNORMAL HIGH (ref 11.5–15.5)
WBC: 5 10*3/uL (ref 4.0–10.5)
nRBC: 0 % (ref 0.0–0.2)

## 2019-07-21 LAB — COMPREHENSIVE METABOLIC PANEL
ALT: 48 U/L — ABNORMAL HIGH (ref 0–44)
AST: 55 U/L — ABNORMAL HIGH (ref 15–41)
Albumin: 2.2 g/dL — ABNORMAL LOW (ref 3.5–5.0)
Alkaline Phosphatase: 135 U/L — ABNORMAL HIGH (ref 38–126)
Anion gap: 5 (ref 5–15)
BUN: 16 mg/dL (ref 6–20)
CO2: 20 mmol/L — ABNORMAL LOW (ref 22–32)
Calcium: 7.5 mg/dL — ABNORMAL LOW (ref 8.9–10.3)
Chloride: 109 mmol/L (ref 98–111)
Creatinine, Ser: 0.55 mg/dL — ABNORMAL LOW (ref 0.61–1.24)
GFR calc Af Amer: >60 mL/min (ref >60)
GFR calc non Af Amer: >60 mL/min (ref >60)
Glucose, Bld: 92 mg/dL (ref 70–99)
Potassium: 3.4 mmol/L — ABNORMAL LOW (ref 3.5–5.1)
Sodium: 134 mmol/L — ABNORMAL LOW (ref 135–145)
Total Bilirubin: 1.6 mg/dL — ABNORMAL HIGH (ref 0.3–1.2)
Total Protein: 5.1 g/dL — ABNORMAL LOW (ref 6.5–8.1)

## 2019-07-21 LAB — URINALYSIS, ROUTINE W REFLEX MICROSCOPIC
Bilirubin Urine: NEGATIVE
Glucose, UA: NEGATIVE mg/dL
Hgb urine dipstick: NEGATIVE
Ketones, ur: NEGATIVE mg/dL
Leukocytes,Ua: NEGATIVE
Nitrite: NEGATIVE
Protein, ur: NEGATIVE mg/dL
Specific Gravity, Urine: 1.009 (ref 1.005–1.030)
pH: 6 (ref 5.0–8.0)

## 2019-07-21 LAB — AMMONIA: Ammonia: 72 umol/L — ABNORMAL HIGH (ref 9–35)

## 2019-07-21 LAB — PROTIME-INR
INR: 1.5 — ABNORMAL HIGH (ref 0.8–1.2)
Prothrombin Time: 17.6 seconds — ABNORMAL HIGH (ref 11.4–15.2)

## 2019-07-21 LAB — TYPE AND SCREEN
ABO/RH(D): A POS
Antibody Screen: NEGATIVE

## 2019-07-21 LAB — ETHANOL: Alcohol, Ethyl (B): <10 mg/dL (ref <10)

## 2019-07-21 MED ORDER — HYDROMORPHONE HCL 1 MG/ML IJ SOLN
0.5000 mg | Freq: Once | INTRAMUSCULAR | Status: AC
  Administered 2019-07-21: 0.5 mg via INTRAVENOUS
  Filled 2019-07-21: qty 1

## 2019-07-21 MED ORDER — ONDANSETRON HCL 4 MG/2ML IJ SOLN
4.0000 mg | Freq: Once | INTRAMUSCULAR | Status: AC
  Administered 2019-07-21: 4 mg via INTRAVENOUS
  Filled 2019-07-21: qty 2

## 2019-07-21 MED ORDER — SODIUM CHLORIDE 0.9 % IV SOLN
INTRAVENOUS | Status: DC
  Administered 2019-07-21: 21:00:00 via INTRAVENOUS
  Administered 2019-07-22: 1000 mL via INTRAVENOUS

## 2019-07-21 MED ORDER — LEVETIRACETAM IN NACL 500 MG/100ML IV SOLN
500.0000 mg | Freq: Once | INTRAVENOUS | Status: AC
  Administered 2019-07-21: 500 mg via INTRAVENOUS
  Filled 2019-07-21: qty 100

## 2019-07-21 NOTE — ED Triage Notes (Signed)
Pt also complains of left ankle pain

## 2019-07-21 NOTE — ED Triage Notes (Signed)
Per EMS - Pt coming form home where he had unwitnessed seizure w/ hx of seizures. Roommate found him postictal on the floor. Pt complains of left wrist pain and lumbar spinal tenderness. Hx of Wilson's disease. Currently A+Ox4  120 64 72 HR  NSR 99% RA 16 R CBG 144 97.1  22 Rt hand

## 2019-07-21 NOTE — ED Provider Notes (Addendum)
[facility] DEPT Provider Note   CSN: [id number] Arrival date & time: 07/21/19  1901     History   Chief Complaint Chief Complaint  Patient presents with   Seizures   Injuries from seizure    HPI [name] is a 52 y.o. male.     Patient with hx cirrhosis, seizures, presents s/p seizure today at home. Was standing at the time, fell w seizure. Patient c/o pain to left wrist and left hip. Pain acute onset, severe, worse w movement and/or palpation of areas. No radicular pain. No numbness or weakness. Hit head. Unclear length of duration of seizure. Pt states generally compliant w seizure meds, but hasnt taken today. Denies neck or back pain. No chest pain or discomfort. No palpitations. No cough or uri symptoms. No known covid + exposure. Hx ongoing episodic etoh abuse, denies daily use. No abd pain or nvd. No dysuria or gu c/o. Denies fever or chills.   The history is provided by the patient.  Seizures   Past Medical History:  Diagnosis Date   Abuse, drug or alcohol ([place])    Anemia    Bipolar affective disorder ([place])    [facility])    liver cancer treated with micrablation   Chronic liver disease and cirrhosis    Depression    DJD (degenerative joint disease)    right knee   Dyslipidemia    Encephalopathy    Esophageal varices ([place])    Hernia    History of alcohol abuse    PONV (postoperative nausea and vomiting)    Seizures ([place])    Dr Jannifer Franklin   Thrombocytopenia ([place])    Wilson disease    Wrist fracture    right    Patient Active Problem List   Diagnosis Date Noted   Ascites 04/16/2019   Displaced fracture of distal end of right radius 12/23/2016   Seizure ([place]) 10/29/2016   Possible exposure to STD 06/07/2016   S/P total knee replacement using cement 07/15/2015   Chest pain 04/30/2015   Left inguinal hernia 10/22/2014   Hyponatremia 05/06/2014   Diarrhea  04/30/2014   UTI (urinary tract infection) 07/29/2013   Tibial plateau fracture, tibial shaft fracture 07/24/2013   Erectile dysfunction 01/07/2013   Hypokalemia 10/21/2011   Eczema 10/21/2011   Impaired glucose tolerance 10/19/2011   Hepatic encephalopathy ([place]) 08/05/2011   Depression    Seizures ([place])    Wilson disease    Encephalopathy    Thrombocytopenia ([place])    Chronic liver disease and cirrhosis ([place])    History of alcohol abuse    Preventative health care 04/14/2011   Bipolar affective disorder [facility])     Past Surgical History:  Procedure Laterality Date   ESOPHAGOGASTRODUODENOSCOPY     HARDWARE REMOVAL Right 01/22/2015   Procedure: HARDWARE REMOVAL RIGHT LEG;  Surgeon: Leandrew Koyanagi, MD;  Location: [facility];  Service: Orthopedics;  Laterality: Right;   IR PARACENTESIS  10/04/2018   IR PARACENTESIS  04/29/2019   left inguinal hernia  june 2012   [place]   liver cancer  02/2015   ablation   microablation of liver     OPEN REDUCTION INTERNAL FIXATION (ORIF) DISTAL RADIAL FRACTURE Right 12/21/2016   Procedure: OPEN REDUCTION INTERNAL FIXATION (ORIF) RIGHT DISTAL RADIUS FRACTURE;  Surgeon: Leandrew Koyanagi, MD;  Location: [facility];  Service: Orthopedics;  Laterality: Right;   OPEN REDUCTION INTERNAL FIXATION (ORIF) TIBIA/FIBULA FRACTURE  Right 07/25/2013   Procedure: OPEN REDUCTION INTERNAL FIXATION (ORIF) RIGHT TIBIAL PLATEAU, TIBIAL SHAFT AND FIBULA FRACTURES, POSSIBLE FASCIOTOMIES;  Surgeon: Marianna Payment, MD;  Location: [facility] [place];  Service: Orthopedics;  Laterality: Right;   TOTAL KNEE ARTHROPLASTY Right 07/15/2015   Procedure: RIGHT TOTAL KNEE ARTHROPLASTY;  Surgeon: Leandrew Koyanagi, MD;  Location: [facility];  Service: Orthopedics;  Laterality: Right;   VASCULAR SURGERY          Home Medications    Prior to Admission medications   Medication Sig Start Date End Date Taking? Authorizing Provider  cholecalciferol (VITAMIN D) 1000 units tablet Take  1,000 Units by mouth daily.    [provider]  cyclobenzaprine (FLEXERIL) 5 MG tablet Take 1 tablet (5 mg total) by mouth at bedtime. 04/29/19   Biagio Borg, MD  folic acid (FOLVITE) 1 MG tablet Take 1 tablet (1 mg total) by mouth daily. Take 1 mg by mouth daily. 04/29/19   Biagio Borg, MD  furosemide (LASIX) 40 MG tablet Take 2 tablets (80 mg total) by mouth 2 (two) times daily. 04/29/19   Biagio Borg, MD  levETIRAcetam (KEPPRA) 1000 MG tablet Take 1 tablet (1,000 mg total) by mouth 2 (two) times daily. 04/29/19   Biagio Borg, MD  levETIRAcetam (KEPPRA) 500 MG tablet Take by mouth. 06/09/19 06/08/20  [provider]  potassium chloride (K-DUR) 10 MEQ tablet Take 1 tablet (10 mEq total) by mouth 2 (two) times daily. 04/29/19   Biagio Borg, MD  spironolactone (ALDACTONE) 100 MG tablet Take 2 tablets (200 mg total) by mouth every morning. 04/29/19   Biagio Borg, MD  XIFAXAN 550 MG TABS tablet Take 1 tablet (550 mg total) by mouth 3 (three) times daily. Take 550 mg by mouth 3 (three) times daily. 04/29/19   Biagio Borg, MD  zinc gluconate 50 MG tablet Take 50 mg by mouth 3 (three) times daily.     [provider]    Family History Family History  Problem Relation Age of Onset   Heart disease Other    Diabetes Father    Dementia Mother    Glaucoma Brother     Social History Social History   Tobacco Use   Smoking status: Current Some Day Smoker    Packs/day: 0.25    Types: Cigarettes   Smokeless tobacco: Never Used  Substance Use Topics   Alcohol use: No    Alcohol/week: 0.0 standard drinks    Comment: 3-4 bottles a week, quit drinking 2005   Drug use: No    Comment: last use 2005     Allergies   Codeine   Review of Systems Review of Systems  Constitutional: Negative for fever.  HENT: Negative for sore throat.   Eyes: Negative for pain and visual disturbance.  Respiratory: Negative for cough and shortness of breath.   Cardiovascular:  Negative for chest pain.  Gastrointestinal: Negative for abdominal pain, diarrhea and vomiting.  Endocrine: Negative for polyuria.  Genitourinary: Negative for dysuria and flank pain.  Musculoskeletal: Negative for back pain and neck pain.  Skin: Negative for wound.  Neurological: Positive for seizures. Negative for numbness.  Hematological: Does not bruise/bleed easily.  Psychiatric/Behavioral: Negative for confusion.     Physical Exam Updated Vital Signs BP 114/61 (BP Location: Right Arm)    Pulse 73    Temp (!) 97.5 F (36.4 C) (Oral)    Resp 18    Ht 1.854 m (6\' 1" )  Wt 90.7 kg    SpO2 97%    BMI 26.39 kg/m   Physical Exam Vitals signs and nursing note reviewed.  Constitutional:      Appearance: Normal appearance. He is well-developed.  HENT:     Head: Atraumatic.     Comments: Tenderness scalp. Small contusion.     Nose: Nose normal.     Mouth/Throat:     Mouth: Mucous membranes are moist.     Pharynx: Oropharynx is clear.  Eyes:     General: No scleral icterus.    Conjunctiva/sclera: Conjunctivae normal.     Pupils: Pupils are equal, round, and reactive to light.  Neck:     Musculoskeletal: Normal range of motion and neck supple. No neck rigidity.     Trachea: No tracheal deviation.  Cardiovascular:     Rate and Rhythm: Normal rate and regular rhythm.     Pulses: Normal pulses.     Heart sounds: Normal heart sounds. No murmur. No friction rub. No gallop.   Pulmonary:     Effort: Pulmonary effort is normal. No accessory muscle usage or respiratory distress.     Breath sounds: Normal breath sounds.  Chest:     Chest wall: No tenderness.  Abdominal:     General: Bowel sounds are normal. There is no distension.     Palpations: Abdomen is soft.     Tenderness: There is no abdominal tenderness. There is no guarding.     Comments: Soft non tender, reduced, umbilical hernia.   Genitourinary:    Comments: No cva tenderness. Significant scrotal edema - pt indicates is  his baseline.  Musculoskeletal:        General: No swelling.     Comments: Tenderness/pain left hip and left wrist. Skin intact over areas. Distal pulses palp bil upper and lower ext. No other focal bony tenderness. Mid cervical tenderness, otherwise, CTLS spine, non tender, aligned, no step off.   Skin:    General: Skin is warm and dry.     Findings: No rash.  Neurological:     Mental Status: He is alert.     Comments: Alert, speech clear. Motor intact bil, stre 5/5. sens grossly intact bil.   Psychiatric:        Mood and Affect: Mood normal.      ED Treatments / Results  Labs (all labs ordered are listed, but only abnormal results are displayed) Results for orders placed or performed during the hospital encounter of 07/21/19  CBC  Result Value Ref Range   WBC 5.0 4.0 - 10.5 K/uL   RBC 3.13 (L) 4.22 - 5.81 MIL/uL   Hemoglobin 10.6 (L) 13.0 - 17.0 g/dL   HCT 31.7 (L) 39.0 - 52.0 %   MCV 101.3 (H) 80.0 - 100.0 fL   MCH 33.9 26.0 - 34.0 pg   MCHC 33.4 30.0 - 36.0 g/dL   RDW 15.7 (H) 11.5 - 15.5 %   Platelets 53 (L) 150 - 400 K/uL   nRBC 0.0 0.0 - 0.2 %  CMET  Result Value Ref Range   Sodium 134 (L) 135 - 145 mmol/L   Potassium 3.4 (L) 3.5 - 5.1 mmol/L   Chloride 109 98 - 111 mmol/L   CO2 20 (L) 22 - 32 mmol/L   Glucose, Bld 92 70 - 99 mg/dL   BUN 16 6 - 20 mg/dL   Creatinine, Ser 0.55 (L) 0.61 - 1.24 mg/dL   Calcium 7.5 (L) 8.9 - 10.3 mg/dL  Total Protein 5.1 (L) 6.5 - 8.1 g/dL   Albumin 2.2 (L) 3.5 - 5.0 g/dL   AST 55 (H) 15 - 41 U/L   ALT 48 (H) 0 - 44 U/L   Alkaline Phosphatase 135 (H) 38 - 126 U/L   Total Bilirubin 1.6 (H) 0.3 - 1.2 mg/dL   GFR calc non Af Amer >60 >60 mL/min   GFR calc Af Amer >60 >60 mL/min   Anion gap 5 5 - 15  PT/INR  Result Value Ref Range   Prothrombin Time 17.6 (H) 11.4 - 15.2 seconds   INR 1.5 (H) 0.8 - 1.2  UA  Result Value Ref Range   Color, Urine YELLOW YELLOW   APPearance CLEAR CLEAR   Specific Gravity, Urine 1.009 1.005 -  1.030   pH 6.0 5.0 - 8.0   Glucose, UA NEGATIVE NEGATIVE mg/dL   Hgb urine dipstick NEGATIVE NEGATIVE   Bilirubin Urine NEGATIVE NEGATIVE   Ketones, ur NEGATIVE NEGATIVE mg/dL   Protein, ur NEGATIVE NEGATIVE mg/dL   Nitrite NEGATIVE NEGATIVE   Leukocytes,Ua NEGATIVE NEGATIVE  Ammonia  Result Value Ref Range   Ammonia 72 (H) 9 - 35 umol/L  Etoh  Result Value Ref Range   Alcohol, Ethyl (B) <10 <10 mg/dL    EKG EKG Interpretation  Date/Time:  Monday July 21 2019 21:50:56 EST Ventricular Rate:  79 PR Interval:    QRS Duration: 89 QT Interval:  401 QTC Calculation: 460 R Axis:   73 Text Interpretation: Sinus rhythm Nonspecific T wave abnormality Confirmed by Lajean Saver [phone]) on 07/21/2019 10:15:36 PM   Radiology Xr Chest Preop 1 View  Result Date: 07/21/2019 CLINICAL DATA:  53 year old male status post seizure and fall at home. EXAM: CHEST  1 VIEW COMPARISON:  Chest radiographs 04/30/2015 and earlier. FINDINGS: Portable AP supine view at 2121 hours. Externals zipper artifact suspected. Lung volumes and mediastinal contours remain normal. Visualized tracheal air column is within normal limits. Allowing for portable technique the lungs are clear. Subacute appearing left lateral 9th rib fracture with some periosteal new bone formation. No superimposed No acute osseous abnormality identified. IMPRESSION: 1. Subacute appearing left 9th rib fracture. 2. No acute cardiopulmonary abnormality. Electronically Signed   By: Genevie Ann M.D.   On: 07/21/2019 21:47   Xr Wrist L  Result Date: 07/21/2019 CLINICAL DATA:  53 year old male status post seizure and fall. Wrist deformity. EXAM: LEFT WRIST - COMPLETE 3+ VIEW COMPARISON:  None. FINDINGS: Comminuted and dorsally impacted distal left radius fracture. DRU involvement. Mild radial displacement also. No definite radiocarpal involvement. Mildly comminuted and displaced ulnar styloid fracture. Carpal bone alignment within normal limits.  Visible metacarpals intact. IMPRESSION: 1. Comminuted and dorsally impacted distal left radius fracture. 2. Ulnar styloid fracture. Electronically Signed   By: Genevie Ann M.D.   On: 07/21/2019 21:45   Ct Head Wo Contrast  Result Date: 07/21/2019 CLINICAL DATA:  53 year old male status post seizure and fall at home. Found down postictal. EXAM: CT HEAD WITHOUT CONTRAST TECHNIQUE: Contiguous axial images were obtained from the base of the skull through the vertex without intravenous contrast. COMPARISON:  Head CT 08/05/2011 and earlier. FINDINGS: Brain: Stable cerebral volume since 2012, chronic brainstem volume loss. No midline shift, ventriculomegaly, mass effect, evidence of mass lesion, intracranial hemorrhage or evidence of cortically based acute infarction. Gray-white matter differentiation is within normal limits throughout the brain. Vascular: No suspicious intracranial vascular hyperdensity. Skull: Stable, intact. Sinuses/Orbits: Visualized paranasal sinuses and mastoids are stable and well  pneumatized. Other: Visualized orbits and scalp soft tissues are within normal limits. IMPRESSION: Stable since 2012. No acute intracranial abnormality or acute traumatic injury identified. Electronically Signed   By: Genevie Ann M.D.   On: 07/21/2019 21:51   Ct Cervical Spine Wo Contrast  Result Date: 07/21/2019 CLINICAL DATA:  53 year old male status post seizure and fall at home. Found down postictal. EXAM: CT CERVICAL SPINE WITHOUT CONTRAST TECHNIQUE: Multidetector CT imaging of the cervical spine was performed without intravenous contrast. Multiplanar CT image reconstructions were also generated. COMPARISON:  Head CT today reported separately. CT cervical spine 10/29/2016. FINDINGS: Alignment: Improved cervical lordosis since 2018. Mild levoconvex scoliosis. Cervicothoracic junction alignment is within normal limits. Bilateral posterior element alignment is within normal limits. Skull base and vertebrae: Visualized  skull base is intact. No atlanto-occipital dissociation. No acute osseous abnormality identified. Soft tissues and spinal canal: No prevertebral fluid or swelling. No visible canal hematoma. Negative noncontrast neck soft tissues. Disc levels: Intermittent chronic disc and endplate degeneration, but capacious cervical spinal canal with no spinal stenosis suspected. Upper chest: Visible upper thoracic levels appear intact. Mild atelectasis in the lung apices. Trace retained secretions in the trachea on series 6, image 82. IMPRESSION: 1. No acute traumatic injury identified in the cervical spine. 2. Trace retained secretions in the trachea at the thoracic inlet. Electronically Signed   By: Genevie Ann M.D.   On: 07/21/2019 22:01   Xr Hip Left  Result Date: 07/21/2019 CLINICAL DATA:  53 year old male status post seizure and fall. Hip deformity. EXAM: DG HIP (WITH OR WITHOUT PELVIS) 2-3V LEFT COMPARISON:  CT Abdomen and Pelvis 12/24/2014. FINDINGS: Impacted left femoral neck fracture, possibly subcapital. This appears minimally displaced aside from the mild impaction. Left femoral head remains normally located. The intertrochanteric segment appears to remain intact. Hip joint spaces are symmetric and normal for age. Grossly intact proximal right femur. Pelvis appears intact. Negative visible bowel gas pattern. IMPRESSION: Mildly impacted left femoral neck fracture. Electronically Signed   By: Genevie Ann M.D.   On: 07/21/2019 21:44    Procedures Procedures (including critical care time)  Medications Ordered in ED Medications  0.9 %  sodium chloride infusion (has no administration in time range)  HYDROmorphone (DILAUDID) injection 0.5 mg (has no administration in time range)  ondansetron (ZOFRAN) injection 4 mg (has no administration in time range)  levETIRAcetam (KEPPRA) IVPB 500 mg/100 mL premix (has no administration in time range)     Initial Impression / Assessment and Plan / ED Course  I have reviewed  the triage vital signs and the nursing notes.  Pertinent labs & imaging results that were available during my care of the patient were reviewed by me and considered in my medical decision making (see chart for details).  Iv ns. Seizure precautions. Stat labs and imaging. Continuous pulse ox and monitoring.   Reviewed nursing notes and prior charts for additional history.   Dilaudid iv. zofran iv.   Patient concerned that he be given keppra as hasnt had it today. keppra iv.   xrays reviewed/interpreted by me - left fem neck fx. Will consult orthopedics - patient indicates his orthopedist is Dr Erlinda Hong.  Will consult medicine service for admission re hip fx, seizures.  Left wrist xrays with displaced/angulated distal radius fracture.   Sugar tong splint to wrist.   Pain improved w meds. Recheck bil extremities nvi.   Labs reviewed/interpreted by me - k sl low. lfts elev.   Discussed pt with ortho, Dr  Louanne Skye - he will see in ED.   Hospitalists consulted for admission - will admit.  Recheck pt, pain improved. Alert, content, nad.         Final Clinical Impressions(s) / ED Diagnoses   Final diagnoses:  None    ED Discharge Orders    None        Lajean Saver, MD 07/21/19 2316

## 2019-07-22 ENCOUNTER — Encounter (HOSPITAL_COMMUNITY): Payer: Self-pay

## 2019-07-22 ENCOUNTER — Other Ambulatory Visit: Payer: Self-pay

## 2019-07-22 ENCOUNTER — Inpatient Hospital Stay (HOSPITAL_COMMUNITY): Payer: Medicare Other

## 2019-07-22 DIAGNOSIS — G40909 Epilepsy, unspecified, not intractable, without status epilepticus: Secondary | ICD-10-CM | POA: Diagnosis present

## 2019-07-22 DIAGNOSIS — W19XXXA Unspecified fall, initial encounter: Secondary | ICD-10-CM

## 2019-07-22 DIAGNOSIS — R569 Unspecified convulsions: Secondary | ICD-10-CM | POA: Diagnosis not present

## 2019-07-22 DIAGNOSIS — K439 Ventral hernia without obstruction or gangrene: Secondary | ICD-10-CM | POA: Diagnosis present

## 2019-07-22 DIAGNOSIS — F1011 Alcohol abuse, in remission: Secondary | ICD-10-CM

## 2019-07-22 DIAGNOSIS — S2232XA Fracture of one rib, left side, initial encounter for closed fracture: Secondary | ICD-10-CM

## 2019-07-22 DIAGNOSIS — D649 Anemia, unspecified: Secondary | ICD-10-CM | POA: Diagnosis present

## 2019-07-22 DIAGNOSIS — F17210 Nicotine dependence, cigarettes, uncomplicated: Secondary | ICD-10-CM | POA: Diagnosis present

## 2019-07-22 DIAGNOSIS — S52612A Displaced fracture of left ulna styloid process, initial encounter for closed fracture: Secondary | ICD-10-CM | POA: Diagnosis present

## 2019-07-22 DIAGNOSIS — Z8505 Personal history of malignant neoplasm of liver: Secondary | ICD-10-CM | POA: Diagnosis not present

## 2019-07-22 DIAGNOSIS — S52602A Unspecified fracture of lower end of left ulna, initial encounter for closed fracture: Secondary | ICD-10-CM | POA: Diagnosis not present

## 2019-07-22 DIAGNOSIS — Z833 Family history of diabetes mellitus: Secondary | ICD-10-CM | POA: Diagnosis not present

## 2019-07-22 DIAGNOSIS — E876 Hypokalemia: Secondary | ICD-10-CM | POA: Diagnosis present

## 2019-07-22 DIAGNOSIS — S62102A Fracture of unspecified carpal bone, left wrist, initial encounter for closed fracture: Secondary | ICD-10-CM | POA: Diagnosis present

## 2019-07-22 DIAGNOSIS — S52502A Unspecified fracture of the lower end of left radius, initial encounter for closed fracture: Secondary | ICD-10-CM | POA: Diagnosis present

## 2019-07-22 DIAGNOSIS — E8809 Other disorders of plasma-protein metabolism, not elsewhere classified: Secondary | ICD-10-CM

## 2019-07-22 DIAGNOSIS — Z96651 Presence of right artificial knee joint: Secondary | ICD-10-CM | POA: Diagnosis present

## 2019-07-22 DIAGNOSIS — D696 Thrombocytopenia, unspecified: Secondary | ICD-10-CM

## 2019-07-22 DIAGNOSIS — Z79899 Other long term (current) drug therapy: Secondary | ICD-10-CM | POA: Diagnosis not present

## 2019-07-22 DIAGNOSIS — S72002A Fracture of unspecified part of neck of left femur, initial encounter for closed fracture: Secondary | ICD-10-CM | POA: Diagnosis present

## 2019-07-22 DIAGNOSIS — K7031 Alcoholic cirrhosis of liver with ascites: Secondary | ICD-10-CM

## 2019-07-22 DIAGNOSIS — F3160 Bipolar disorder, current episode mixed, unspecified: Secondary | ICD-10-CM | POA: Diagnosis not present

## 2019-07-22 DIAGNOSIS — F319 Bipolar disorder, unspecified: Secondary | ICD-10-CM | POA: Diagnosis present

## 2019-07-22 DIAGNOSIS — E7220 Disorder of urea cycle metabolism, unspecified: Secondary | ICD-10-CM

## 2019-07-22 DIAGNOSIS — Z20828 Contact with and (suspected) exposure to other viral communicable diseases: Secondary | ICD-10-CM | POA: Diagnosis present

## 2019-07-22 DIAGNOSIS — F1021 Alcohol dependence, in remission: Secondary | ICD-10-CM | POA: Diagnosis present

## 2019-07-22 DIAGNOSIS — Z9114 Patient's other noncompliance with medication regimen: Secondary | ICD-10-CM | POA: Diagnosis not present

## 2019-07-22 DIAGNOSIS — D689 Coagulation defect, unspecified: Secondary | ICD-10-CM

## 2019-07-22 DIAGNOSIS — M25572 Pain in left ankle and joints of left foot: Secondary | ICD-10-CM

## 2019-07-22 DIAGNOSIS — M810 Age-related osteoporosis without current pathological fracture: Secondary | ICD-10-CM | POA: Diagnosis present

## 2019-07-22 DIAGNOSIS — Z885 Allergy status to narcotic agent status: Secondary | ICD-10-CM | POA: Diagnosis not present

## 2019-07-22 DIAGNOSIS — D6959 Other secondary thrombocytopenia: Secondary | ICD-10-CM | POA: Diagnosis present

## 2019-07-22 DIAGNOSIS — S2239XA Fracture of one rib, unspecified side, initial encounter for closed fracture: Secondary | ICD-10-CM | POA: Diagnosis present

## 2019-07-22 LAB — COMPREHENSIVE METABOLIC PANEL
ALT: 53 U/L — ABNORMAL HIGH (ref 0–44)
AST: 59 U/L — ABNORMAL HIGH (ref 15–41)
Albumin: 2.4 g/dL — ABNORMAL LOW (ref 3.5–5.0)
Alkaline Phosphatase: 145 U/L — ABNORMAL HIGH (ref 38–126)
Anion gap: 5 (ref 5–15)
BUN: 15 mg/dL (ref 6–20)
CO2: 23 mmol/L (ref 22–32)
Calcium: 8.7 mg/dL — ABNORMAL LOW (ref 8.9–10.3)
Chloride: 102 mmol/L (ref 98–111)
Creatinine, Ser: 0.65 mg/dL (ref 0.61–1.24)
GFR calc Af Amer: >60 mL/min (ref >60)
GFR calc non Af Amer: >60 mL/min (ref >60)
Glucose, Bld: 90 mg/dL (ref 70–99)
Potassium: 4.7 mmol/L (ref 3.5–5.1)
Sodium: 130 mmol/L — ABNORMAL LOW (ref 135–145)
Total Bilirubin: 2.7 mg/dL — ABNORMAL HIGH (ref 0.3–1.2)
Total Protein: 5.4 g/dL — ABNORMAL LOW (ref 6.5–8.1)

## 2019-07-22 LAB — CBC
HCT: 37.9 % — ABNORMAL LOW (ref 39.0–52.0)
Hemoglobin: 12.6 g/dL — ABNORMAL LOW (ref 13.0–17.0)
MCH: 34.2 pg — ABNORMAL HIGH (ref 26.0–34.0)
MCHC: 33.2 g/dL (ref 30.0–36.0)
MCV: 103 fL — ABNORMAL HIGH (ref 80.0–100.0)
Platelets: 54 10*3/uL — ABNORMAL LOW (ref 150–400)
RBC: 3.68 MIL/uL — ABNORMAL LOW (ref 4.22–5.81)
RDW: 15.8 % — ABNORMAL HIGH (ref 11.5–15.5)
WBC: 5.3 10*3/uL (ref 4.0–10.5)
nRBC: 0 % (ref 0.0–0.2)

## 2019-07-22 LAB — SARS CORONAVIRUS 2 (TAT 6-24 HRS): SARS Coronavirus 2: NEGATIVE

## 2019-07-22 LAB — SURGICAL PCR SCREEN
MRSA, PCR: NEGATIVE
Staphylococcus aureus: NEGATIVE

## 2019-07-22 LAB — AMMONIA: Ammonia: 38 umol/L — ABNORMAL HIGH (ref 9–35)

## 2019-07-22 MED ORDER — ENSURE ENLIVE PO LIQD
237.0000 mL | Freq: Two times a day (BID) | ORAL | Status: DC
  Administered 2019-07-22 – 2019-07-29 (×10): 237 mL via ORAL

## 2019-07-22 MED ORDER — POTASSIUM CHLORIDE 10 MEQ/100ML IV SOLN
10.0000 meq | INTRAVENOUS | Status: AC
  Administered 2019-07-22 (×2): 10 meq via INTRAVENOUS
  Filled 2019-07-22 (×2): qty 100

## 2019-07-22 MED ORDER — LEVETIRACETAM IN NACL 1500 MG/100ML IV SOLN
1500.0000 mg | Freq: Two times a day (BID) | INTRAVENOUS | Status: DC
  Administered 2019-07-23 – 2019-07-24 (×3): 1500 mg via INTRAVENOUS
  Filled 2019-07-22 (×6): qty 100

## 2019-07-22 MED ORDER — LEVETIRACETAM IN NACL 1000 MG/100ML IV SOLN
1000.0000 mg | Freq: Once | INTRAVENOUS | Status: AC
  Administered 2019-07-22: 1000 mg via INTRAVENOUS
  Filled 2019-07-22: qty 100

## 2019-07-22 MED ORDER — ONDANSETRON HCL 4 MG/2ML IJ SOLN: 4.0000 mg | Freq: Four times a day (QID) | INTRAMUSCULAR | Status: DC | PRN

## 2019-07-22 MED ORDER — SPIRONOLACTONE 25 MG PO TABS
200.0000 mg | ORAL_TABLET | Freq: Every morning | ORAL | Status: DC
  Administered 2019-07-22 – 2019-07-29 (×7): 200 mg via ORAL
  Filled 2019-07-22 (×6): qty 8

## 2019-07-22 MED ORDER — METHOCARBAMOL 1000 MG/10ML IJ SOLN
500.0000 mg | Freq: Four times a day (QID) | INTRAVENOUS | Status: DC | PRN
  Filled 2019-07-22: qty 5

## 2019-07-22 MED ORDER — LACTULOSE 10 GM/15ML PO SOLN
10.0000 g | Freq: Three times a day (TID) | ORAL | Status: DC
  Administered 2019-07-22 – 2019-07-29 (×13): 10 g via ORAL
  Filled 2019-07-22 (×11): qty 15
  Filled 2019-07-22: qty 30
  Filled 2019-07-22 (×7): qty 15

## 2019-07-22 MED ORDER — FUROSEMIDE 40 MG PO TABS
80.0000 mg | ORAL_TABLET | Freq: Two times a day (BID) | ORAL | Status: DC
  Administered 2019-07-22 – 2019-07-29 (×11): 80 mg via ORAL
  Filled 2019-07-22 (×14): qty 2

## 2019-07-22 MED ORDER — HYDROCODONE-ACETAMINOPHEN 5-325 MG PO TABS
1.0000 | ORAL_TABLET | Freq: Four times a day (QID) | ORAL | Status: DC | PRN
  Administered 2019-07-22: 2 via ORAL
  Administered 2019-07-24: 1 via ORAL
  Filled 2019-07-22: qty 1
  Filled 2019-07-22: qty 2

## 2019-07-22 MED ORDER — LEVETIRACETAM IN NACL 500 MG/100ML IV SOLN
500.0000 mg | Freq: Two times a day (BID) | INTRAVENOUS | Status: DC
  Administered 2019-07-22: 500 mg via INTRAVENOUS
  Filled 2019-07-22: qty 100

## 2019-07-22 MED ORDER — METHOCARBAMOL 500 MG PO TABS
500.0000 mg | ORAL_TABLET | Freq: Four times a day (QID) | ORAL | Status: DC | PRN
  Administered 2019-07-22 (×2): 500 mg via ORAL
  Filled 2019-07-22 (×2): qty 1

## 2019-07-22 MED ORDER — BISACODYL 10 MG RE SUPP: 10.0000 mg | Freq: Every day | RECTAL | Status: DC | PRN

## 2019-07-22 MED ORDER — MORPHINE SULFATE (PF) 2 MG/ML IV SOLN
0.5000 mg | INTRAVENOUS | Status: DC | PRN
  Administered 2019-07-22 (×4): 0.5 mg via INTRAVENOUS
  Filled 2019-07-22 (×4): qty 1

## 2019-07-22 MED ORDER — FOLIC ACID 1 MG PO TABS
1.0000 mg | ORAL_TABLET | Freq: Every day | ORAL | Status: DC
  Administered 2019-07-22 – 2019-07-29 (×7): 1 mg via ORAL
  Filled 2019-07-22 (×7): qty 1

## 2019-07-22 MED ORDER — ONDANSETRON HCL 4 MG PO TABS: 4.0000 mg | ORAL_TABLET | Freq: Four times a day (QID) | ORAL | Status: DC | PRN

## 2019-07-22 MED ORDER — TRANEXAMIC ACID 1000 MG/10ML IV SOLN
2000.0000 mg | INTRAVENOUS | Status: AC
  Administered 2019-07-23: 2000 mg via TOPICAL
  Filled 2019-07-22 (×2): qty 20

## 2019-07-22 MED ORDER — TRANEXAMIC ACID-NACL 1000-0.7 MG/100ML-% IV SOLN
1000.0000 mg | INTRAVENOUS | Status: AC
  Administered 2019-07-23: 1000 mg via INTRAVENOUS

## 2019-07-22 MED ORDER — CEFAZOLIN SODIUM-DEXTROSE 2-4 GM/100ML-% IV SOLN
2.0000 g | INTRAVENOUS | Status: AC
  Administered 2019-07-23: 2 g via INTRAVENOUS
  Filled 2019-07-22: qty 100

## 2019-07-22 MED ORDER — DOCUSATE SODIUM 100 MG PO CAPS
100.0000 mg | ORAL_CAPSULE | Freq: Two times a day (BID) | ORAL | Status: DC
  Administered 2019-07-22 (×2): 100 mg via ORAL
  Filled 2019-07-22 (×2): qty 1

## 2019-07-22 MED ORDER — HYDROMORPHONE HCL 1 MG/ML IJ SOLN
1.0000 mg | Freq: Once | INTRAMUSCULAR | Status: AC
  Administered 2019-07-22: 1 mg via INTRAVENOUS
  Filled 2019-07-22: qty 1

## 2019-07-22 MED ORDER — ADULT MULTIVITAMIN W/MINERALS CH
1.0000 | ORAL_TABLET | Freq: Every day | ORAL | Status: DC
  Administered 2019-07-22 – 2019-07-29 (×7): 1 via ORAL
  Filled 2019-07-22 (×6): qty 1

## 2019-07-22 MED ORDER — POLYETHYLENE GLYCOL 3350 17 G PO PACK: 17.0000 g | PACK | Freq: Every day | ORAL | Status: DC | PRN

## 2019-07-22 MED ORDER — RIFAXIMIN 550 MG PO TABS
550.0000 mg | ORAL_TABLET | Freq: Three times a day (TID) | ORAL | Status: DC
  Administered 2019-07-22 – 2019-07-29 (×21): 550 mg via ORAL
  Filled 2019-07-22 (×24): qty 1

## 2019-07-22 MED ORDER — LEVETIRACETAM 500 MG PO TABS: 500.0000 mg | ORAL_TABLET | Freq: Once | ORAL | Status: DC

## 2019-07-22 MED ORDER — ENSURE PRE-SURGERY PO LIQD
296.0000 mL | Freq: Once | ORAL | Status: AC
  Administered 2019-07-23: 296 mL via ORAL
  Filled 2019-07-22 (×2): qty 296

## 2019-07-22 MED ORDER — LACTULOSE 10 GM/15ML PO SOLN
20.0000 g | Freq: Three times a day (TID) | ORAL | Status: DC
  Administered 2019-07-22: 20 g via ORAL
  Filled 2019-07-22: qty 30

## 2019-07-22 MED ORDER — FLEET ENEMA 7-19 GM/118ML RE ENEM: 1.0000 | ENEMA | Freq: Once | RECTAL | Status: DC | PRN

## 2019-07-22 MED ORDER — LORAZEPAM 2 MG/ML IJ SOLN: 1.0000 mg | INTRAMUSCULAR | Status: DC | PRN

## 2019-07-22 MED ORDER — POVIDONE-IODINE 10 % EX SWAB: 2.0000 "application " | Freq: Once | CUTANEOUS | Status: DC

## 2019-07-22 NOTE — Progress Notes (Signed)
Patient transported off department via [place]. Report called to Blanch Media, Therapist, sports. Patient transferred off unit in stable condition. [name] I

## 2019-07-22 NOTE — Progress Notes (Signed)
Patient with complaints of "stuttering"; informed NT that when he starts to stutter, it is an aura for a seizure. Patient reported to this RN that he takes Keppra twice per day at home and had not received any yet today. This RN paged the on call provider regarding these symptoms and patient reports. Will continue to monitor pt and carry out any new orders. Carnella [place] I

## 2019-07-22 NOTE — Anesthesia Preprocedure Evaluation (Addendum)
Anesthesia Evaluation  Patient identified by MRN, date of birth, ID band Patient awake    Reviewed: Allergy & Precautions, H&P , NPO status , Patient's Chart, lab work & pertinent test results  History of Anesthesia Complications (+) PONV and history of anesthetic complications  Airway Mallampati: II  TM Distance: >3 FB Neck ROM: Full    Dental no notable dental hx. (+) Poor Dentition, Chipped   Pulmonary neg pulmonary ROS, Current Smoker,    Pulmonary exam normal breath sounds clear to auscultation       Cardiovascular Exercise Tolerance: Good negative cardio ROS Normal cardiovascular exam Rhythm:Regular Rate:Normal     Neuro/Psych Seizures -, Poorly Controlled,  PSYCHIATRIC DISORDERS Depression Bipolar Disorder negative neurological ROS  negative psych ROS   GI/Hepatic negative GI ROS, Neg liver ROS, (+) Cirrhosis   ascites  substance abuse  alcohol use,   Endo/Other  negative endocrine ROS  Renal/GU negative Renal ROS  negative genitourinary   Musculoskeletal negative musculoskeletal ROS (+)   Abdominal   Peds negative pediatric ROS (+)  Hematology negative hematology ROS (+) Blood dyscrasia, anemia ,   Anesthesia Other Findings   Reproductive/Obstetrics negative OB ROS                            Anesthesia Physical Anesthesia Plan  ASA: IV  Anesthesia Plan: General   Post-op Pain Management:    Induction: Intravenous  PONV Risk Score and Plan: 2 and Ondansetron, Treatment may vary due to age or medical condition, Dexamethasone and Midazolam  Airway Management Planned: Oral ETT and LMA  Additional Equipment:   Intra-op Plan:   Post-operative Plan: Extubation in OR  Informed Consent: I have reviewed the patients History and Physical, chart, labs and discussed the procedure including the risks, benefits and alternatives for the proposed anesthesia with the patient or  authorized representative who has indicated his/her understanding and acceptance.       Plan Discussed with: CRNA, Anesthesiologist and Surgeon  Anesthesia Plan Comments: ( ECG showing no evidence of acute ischemia  known history of   Liver disease Given hx of chronic disease patient is at least moderate  risk    at this point no furthther cardiac workup is indicated.  . Hypokalemia   . Bipolar affective disorder ([place]) chronic currently stable . Wilson disease -noting chronic liver disease followed by [facility] currently stable . History of alcohol abuse -currently in remission no evidence of alcohol withdrawal . Left wrist fracture as per orthopedics appreciate their care . Rib fracture -conservative measures control incentive spirometry Thrombocytopenia -orthopedics aware patient at this point declines blood product transfusion continue to monitor History of seizure disorder with recent seizure secondary to noncompliance with Keppra.  Will restart Keppra keep IV while patient n.p.o. seizure precautions )       Anesthesia Quick Evaluation

## 2019-07-22 NOTE — Progress Notes (Signed)
Brief note:  Patient is a 53 year old male with history of cirrhosis from alcoholic liver disease, Wilson's  disease, seizure disorder, bipolar/depression/hyperlipidemia who presented with fall after having a seizure episode at home.  Patient reported that he missed his Keppra dose at home and had a seizure.  After the fall, he developed pain on the left wrist and left hip. Work-up in the emergency department showed closed left femoral neck fracture and fracture of left distal radius. Orthopedics consulted, planning for ORIF tomorrow . Patient seen and examined the bedside this morning.  Currently he is hemodynamically stable.  Comfortable.  Pain well controlled. Patient follows with gastroenterology at [facility] and also requests as needed paracentesis for his ascites.  He is compliant with his medicines and not drinking alcohol at present.  His ammonia level is mild elevated.  We will continue his home rifaximin and also add lactulose.  Continue IV Keppra until surgery is done. After discussion with Dr. Alvie Heidelberg this morning, will transfer to [facility] for surgery tomorrow. Patient seen by Dr. Roel Cluck this morning.  I agree with her assessment and plan.

## 2019-07-22 NOTE — Progress Notes (Signed)
Patient refused pm 1800 Lasix. Education provided regarding the importance of diuretics. Pt is not agreeable.

## 2019-07-22 NOTE — Consult Note (Signed)
ORTHOPAEDIC CONSULTATION  REQUESTING PHYSICIAN: Shelly Coss, MD  Chief Complaint: Left femoral neck and left distal radius fracture  HPI: [name] is a 53 y.o. male who presents with above mentioned conditions likely s/p mechanical fall following seizure.  His roommate found him down.  The patient is well known to me from prior orthopedic conditions that I have treated.  He has wilson's disease, bipolar, cirrhosis.    Past Medical History:  Diagnosis Date   Abuse, drug or alcohol ([place])    Anemia    Bipolar affective disorder ([place])    [facility])    liver cancer treated with micrablation   Chronic liver disease and cirrhosis    Depression    DJD (degenerative joint disease)    right knee   Dyslipidemia    Encephalopathy    Esophageal varices ([facility])    Hernia    History of alcohol abuse    PONV (postoperative nausea and vomiting)    Seizures ([facility])    Dr Jannifer Franklin   Thrombocytopenia ([place])    Wilson disease    Wrist fracture    right   Past Surgical History:  Procedure Laterality Date   ESOPHAGOGASTRODUODENOSCOPY     HARDWARE REMOVAL Right 01/22/2015   Procedure: HARDWARE REMOVAL RIGHT LEG;  Surgeon: Leandrew Koyanagi, MD;  Location: [place];  Service: Orthopedics;  Laterality: Right;   IR PARACENTESIS  10/04/2018   IR PARACENTESIS  04/29/2019   left inguinal hernia  june 2012   [place]   liver cancer  02/2015   ablation   microablation of liver     OPEN REDUCTION INTERNAL FIXATION (ORIF) DISTAL RADIAL FRACTURE Right 12/21/2016   Procedure: OPEN REDUCTION INTERNAL FIXATION (ORIF) RIGHT DISTAL RADIUS FRACTURE;  Surgeon: Leandrew Koyanagi, MD;  Location: [place];  Service: Orthopedics;  Laterality: Right;   OPEN REDUCTION INTERNAL FIXATION (ORIF) TIBIA/FIBULA FRACTURE Right 07/25/2013   Procedure: OPEN REDUCTION INTERNAL FIXATION (ORIF) RIGHT TIBIAL PLATEAU, TIBIAL SHAFT AND FIBULA FRACTURES, POSSIBLE FASCIOTOMIES;   Surgeon: Marianna Payment, MD;  Location: [place];  Service: Orthopedics;  Laterality: Right;   TOTAL KNEE ARTHROPLASTY Right 07/15/2015   Procedure: RIGHT TOTAL KNEE ARTHROPLASTY;  Surgeon: Leandrew Koyanagi, MD;  Location: [place];  Service: Orthopedics;  Laterality: Right;   VASCULAR SURGERY     Social History   Socioeconomic History   Marital status: Divorced    Spouse name: Not on file   Number of children: 2   Years of education: HS   Highest education level: Not on file  Occupational History   Occupation: disabled    Fish farm manager: DISABILITY  Social Designer, fashion/clothing strain: Not on file   Food insecurity    Worry: Not on file    Inability: Not on file   Transportation needs    Medical: Not on file    Non-medical: Not on file  Tobacco Use   Smoking status: Current Some Day Smoker    Packs/day: 0.25    Types: Cigarettes   Smokeless tobacco: Never Used  Substance and Sexual Activity   Alcohol use: No    Alcohol/week: 0.0 standard drinks    Comment: 3-4 bottles a week, quit drinking 2005   Drug use: No    Comment: last use 2005   Sexual activity: Yes  Lifestyle   Physical activity    Days per week: Not on file    Minutes per session: Not  on file   Stress: Not on file  Relationships   Social connections    Talks on phone: Not on file    Gets together: Not on file    Attends religious service: Not on file    Active member of club or organization: Not on file    Attends meetings of clubs or organizations: Not on file    Relationship status: Not on file  Other Topics Concern   Not on file  Social History Narrative   Patient is right handed.   Patient drinks some caffeine daily.   Family History  Problem Relation Age of Onset   Heart disease Other    Diabetes Father    Dementia Mother    Glaucoma Brother    Allergies  Allergen Reactions   Codeine Nausea And Vomiting    Reports it makes me crazy.    Prior to Admission medications    Medication Sig Start Date End Date Taking? Authorizing Provider  cyclobenzaprine (FLEXERIL) 5 MG tablet Take 1 tablet (5 mg total) by mouth at bedtime. Patient taking differently: Take 5 mg by mouth daily at 12 noon.  04/29/19  Yes Biagio Borg, MD  furosemide (LASIX) 40 MG tablet Take 2 tablets (80 mg total) by mouth 2 (two) times daily. 04/29/19  Yes Biagio Borg, MD  levETIRAcetam (KEPPRA) 1000 MG tablet Take 1 tablet (1,000 mg total) by mouth 2 (two) times daily. Patient taking differently: Take 1,500 mg by mouth 2 (two) times daily.  04/29/19  Yes Biagio Borg, MD  Melatonin 3 MG TABS Take 3 mg by mouth at bedtime.   Yes [provider]  potassium chloride (K-DUR) 10 MEQ tablet Take 1 tablet (10 mEq total) by mouth 2 (two) times daily. 04/29/19  Yes Biagio Borg, MD  POTASSIUM CITRATE PO Take 200 mg by mouth daily.   Yes [provider]  spironolactone (ALDACTONE) 100 MG tablet Take 2 tablets (200 mg total) by mouth every morning. 04/29/19  Yes Biagio Borg, MD  tadalafil (CIALIS) 5 MG tablet Take 5 mg by mouth daily as needed for erectile dysfunction.    Yes [provider]  XIFAXAN 550 MG TABS tablet Take 1 tablet (550 mg total) by mouth 3 (three) times daily. Take 550 mg by mouth 3 (three) times daily. 04/29/19  Yes Biagio Borg, MD  zinc gluconate 50 MG tablet Take 50 mg by mouth 3 (three) times daily.    Yes [provider]  folic acid (FOLVITE) 1 MG tablet Take 1 tablet (1 mg total) by mouth daily. Take 1 mg by mouth daily. Patient not taking: Reported on 07/22/2019 04/29/19   Biagio Borg, MD   Xr Chest Preop 1 View  Result Date: 07/21/2019 CLINICAL DATA:  53 year old male status post seizure and fall at home. EXAM: CHEST  1 VIEW COMPARISON:  Chest radiographs 04/30/2015 and earlier. FINDINGS: Portable AP supine view at 2121 hours. Externals zipper artifact suspected. Lung volumes and mediastinal contours remain normal. Visualized tracheal air  column is within normal limits. Allowing for portable technique the lungs are clear. Subacute appearing left lateral 9th rib fracture with some periosteal new bone formation. No superimposed No acute osseous abnormality identified. IMPRESSION: 1. Subacute appearing left 9th rib fracture. 2. No acute cardiopulmonary abnormality. Electronically Signed   By: Genevie Ann M.D.   On: 07/21/2019 21:47   Xr Wrist L  Result Date: 07/21/2019 CLINICAL DATA:  53 year old male status post seizure  and fall. Wrist deformity. EXAM: LEFT WRIST - COMPLETE 3+ VIEW COMPARISON:  None. FINDINGS: Comminuted and dorsally impacted distal left radius fracture. DRU involvement. Mild radial displacement also. No definite radiocarpal involvement. Mildly comminuted and displaced ulnar styloid fracture. Carpal bone alignment within normal limits. Visible metacarpals intact. IMPRESSION: 1. Comminuted and dorsally impacted distal left radius fracture. 2. Ulnar styloid fracture. Electronically Signed   By: Genevie Ann M.D.   On: 07/21/2019 21:45   Ct Head Wo Contrast  Result Date: 07/21/2019 CLINICAL DATA:  53 year old male status post seizure and fall at home. Found down postictal. EXAM: CT HEAD WITHOUT CONTRAST TECHNIQUE: Contiguous axial images were obtained from the base of the skull through the vertex without intravenous contrast. COMPARISON:  Head CT 08/05/2011 and earlier. FINDINGS: Brain: Stable cerebral volume since 2012, chronic brainstem volume loss. No midline shift, ventriculomegaly, mass effect, evidence of mass lesion, intracranial hemorrhage or evidence of cortically based acute infarction. Gray-white matter differentiation is within normal limits throughout the brain. Vascular: No suspicious intracranial vascular hyperdensity. Skull: Stable, intact. Sinuses/Orbits: Visualized paranasal sinuses and mastoids are stable and well pneumatized. Other: Visualized orbits and scalp soft tissues are within normal limits. IMPRESSION: Stable  since 2012. No acute intracranial abnormality or acute traumatic injury identified. Electronically Signed   By: Genevie Ann M.D.   On: 07/21/2019 21:51   Ct Cervical Spine Wo Contrast  Result Date: 07/21/2019 CLINICAL DATA:  53 year old male status post seizure and fall at home. Found down postictal. EXAM: CT CERVICAL SPINE WITHOUT CONTRAST TECHNIQUE: Multidetector CT imaging of the cervical spine was performed without intravenous contrast. Multiplanar CT image reconstructions were also generated. COMPARISON:  Head CT today reported separately. CT cervical spine 10/29/2016. FINDINGS: Alignment: Improved cervical lordosis since 2018. Mild levoconvex scoliosis. Cervicothoracic junction alignment is within normal limits. Bilateral posterior element alignment is within normal limits. Skull base and vertebrae: Visualized skull base is intact. No atlanto-occipital dissociation. No acute osseous abnormality identified. Soft tissues and spinal canal: No prevertebral fluid or swelling. No visible canal hematoma. Negative noncontrast neck soft tissues. Disc levels: Intermittent chronic disc and endplate degeneration, but capacious cervical spinal canal with no spinal stenosis suspected. Upper chest: Visible upper thoracic levels appear intact. Mild atelectasis in the lung apices. Trace retained secretions in the trachea on series 6, image 82. IMPRESSION: 1. No acute traumatic injury identified in the cervical spine. 2. Trace retained secretions in the trachea at the thoracic inlet. Electronically Signed   By: Genevie Ann M.D.   On: 07/21/2019 22:01   Dg Ankle Left Port  Result Date: 07/22/2019 CLINICAL DATA:  Fall, lateral pain and swelling. EXAM: PORTABLE LEFT ANKLE - 2 VIEW COMPARISON:  None. FINDINGS: There is no evidence of fracture, dislocation, or joint effusion. There is no evidence of arthropathy or other focal bone abnormality. Soft tissues are unremarkable. IMPRESSION: Negative. Electronically Signed   By: Rolm Baptise M.D.   On: 07/22/2019 01:30   Xr Hip Left  Result Date: 07/21/2019 CLINICAL DATA:  53 year old male status post seizure and fall. Hip deformity. EXAM: DG HIP (WITH OR WITHOUT PELVIS) 2-3V LEFT COMPARISON:  CT Abdomen and Pelvis 12/24/2014. FINDINGS: Impacted left femoral neck fracture, possibly subcapital. This appears minimally displaced aside from the mild impaction. Left femoral head remains normally located. The intertrochanteric segment appears to remain intact. Hip joint spaces are symmetric and normal for age. Grossly intact proximal right femur. Pelvis appears intact. Negative visible bowel gas pattern. IMPRESSION: Mildly impacted left femoral neck  fracture. Electronically Signed   By: Genevie Ann M.D.   On: 07/21/2019 21:44    All pertinent xrays, MRI, CT independently reviewed and interpreted  Positive ROS: All other systems have been reviewed and were otherwise negative with the exception of those mentioned in the HPI and as above.  Physical Exam: General: Alert, no acute distress Cardiovascular: No pedal edema Respiratory: No cyanosis, no use of accessory musculature GI: No organomegaly, abdomen is soft and non-tender Skin: No lesions in the area of chief complaint Neurologic: Sensation intact distally Psychiatric: Patient is competent for consent with normal mood and affect Lymphatic: No axillary or cervical lymphadenopathy  MUSCULOSKELETAL:  - pain with movement of the hip and extremity - skin intact - NVI distally - compartments soft - well fitting sugar tong splint - fingers wwp  Assessment: Left femoral neck fracture Left distal radius fracture  Plan: - based on discussion with patient, total hip replacement and ORIF of left distal radius is recommended, patient aware of r/b/a and wish to proceed, informed consent obtained - medical optimization per primary team - surgery is planned for wed morning - appreciate transfer to [place] for surgery - NPO  after midnight tonight   Thank you for the consult and the opportunity to see [name]. Eduard Roux, MD [facility] 7:46 AM

## 2019-07-22 NOTE — Progress Notes (Signed)
Initial Nutrition Assessment  DOCUMENTATION CODES:   Not applicable  INTERVENTION:   - Ensure Enlive po BID, each supplement provides 350 kcal and 20 grams of protein  - MVI with minerals daily  NUTRITION DIAGNOSIS:   Increased nutrient needs related to hip fracture as evidenced by estimated needs.  GOAL:   Patient will meet greater than or equal to 90% of their needs  MONITOR:   PO intake, Supplement acceptance, Labs, Weight trends  REASON FOR ASSESSMENT:   Malnutrition Screening Tool    ASSESSMENT:   53 year old male who presented to the ED on 11/16 after having a seizure. PMH of cirrhosis, seizures, EtOH abuse, Wilson's disease, hepatocellular carcinoma s/p gamma knife surgical ablation, bipolar, depression. Pt found to have left femoral neck fracture and left wrist fracture.   Noted plan for pt to transfer to [facility] for total hip replacement and ORIF of left distal radius scheduled for Wednesday morning.  Unable to speak with pt at this time. Pt transferring to [facility] today.  Reviewed weight history in chart. Weight up 3 kg over the last 10 months. However, weight of 200 lbs on admission appears stated rather than measured.  Per RN edema assessment, pt with mild pitting edema to LLE.  RD will order oral nutrition supplements to aid pt in meeting kcal and protein needs given upcoming surgery and promote post-op healing.  Will also order daily MVI.  Medications reviewed and include: Colace, folic acid, Lasix, lactulose, spironolactone, IV Keppra  Labs reviewed: sodium 130, elevated LFTs  NUTRITION - FOCUSED PHYSICAL EXAM:  Unable to complete at this time.  Diet Order:   Diet Order            Diet Heart Room service appropriate? Yes; Fluid consistency: Thin  Diet effective now              EDUCATION NEEDS:   No education needs have been identified at this time  Skin:  Skin Assessment: Reviewed RN Assessment  Last BM:  no  documented BM  Height:   Ht Readings from Last 1 Encounters:  07/21/19 6\' 1"  (1.854 m)    Weight:   Wt Readings from Last 1 Encounters:  07/21/19 90.7 kg    Ideal Body Weight:  83.6 kg  BMI:  Body mass index is 26.39 kg/m.  Estimated Nutritional Needs:   Kcal:  2200-2400  Protein:  105-125 grams  Fluid:  >/= 2.0 L    Gaynell Face, MS, RD, LDN Inpatient Clinical Dietitian Pager: [phone] Weekend/After Hours: [phone]

## 2019-07-22 NOTE — Consult Note (Signed)
Reason for Consult:Left Hip fracture, left wrist fracture Referring Physician: Dr. Lajean Saver Consulting Physician:Elke Holtry Louanne Skye, MD  Orthopedic Diagnosis:1)Closed left impacted shortened distal radius fracture with apex volar angulation and radial deviation, associated ulna styloid  Fracture, displaced. 2) Closed impacted left femoral neck fracture, nondisplaced slight varus. 3) Wilson's disease, cirrhosis with history of hepatocellular  Carcinoma treated and thromocytopenia chroniically platelet count 50K INR 1.5. 4)Bipolar affective disorder. 5)Osteoporosis 6)Seizure disorder.  [id number] Kendon Boozer is an 53 y.o. male.with history of Wilson's disease and ETOH use in the past, has history of cirrhosis and previous hepatocellular Carcinoma post gamma knife surgical ablation. He has a seizure disorder and is followed by Dr. Jannifer Franklin, apparently he had a seizure this evening,falling and injuring  His left hip and left distal radius. In the past he has had right TKR by Dr. Erlinda Hong in 2016 and right distal radius ORIF by Dr. Erlinda Hong in 2018. He desires to be treated by  Dr. Erlinda Hong. After fall this evening he had left hip pain and left distal forearm and wrist deformity and he was transported via ambulance to [facility]. Reportedly has no  Neck pain, he was pulled out his foley placed by nursing personnel earlier this evening after stating that he did not want a catheter. No bleeding and he has voided  Post his removal of his foley catheter. CT of brain and neck is negative for acute changes. He is a normal ambulator without walker or external support. Has  A house mate and does much of the cooking at the home.    Past Medical History:  Diagnosis Date   Abuse, drug or alcohol ([place])    Anemia    Bipolar affective disorder ([place])    [facility])    liver cancer treated with micrablation   Chronic liver disease and cirrhosis    Depression    DJD (degenerative joint  disease)    right knee   Dyslipidemia    Encephalopathy    Esophageal varices ([facility])    Hernia    History of alcohol abuse    PONV (postoperative nausea and vomiting)    Seizures ([facility])    Dr Jannifer Franklin   Thrombocytopenia ([place])    Wilson disease    Wrist fracture    right    Past Surgical History:  Procedure Laterality Date   ESOPHAGOGASTRODUODENOSCOPY     HARDWARE REMOVAL Right 01/22/2015   Procedure: HARDWARE REMOVAL RIGHT LEG;  Surgeon: Leandrew Koyanagi, MD;  Location: [place];  Service: Orthopedics;  Laterality: Right;   IR PARACENTESIS  10/04/2018   IR PARACENTESIS  04/29/2019   left inguinal hernia  june 2012   [place]   liver cancer  02/2015   ablation   microablation of liver     OPEN REDUCTION INTERNAL FIXATION (ORIF) DISTAL RADIAL FRACTURE Right 12/21/2016   Procedure: OPEN REDUCTION INTERNAL FIXATION (ORIF) RIGHT DISTAL RADIUS FRACTURE;  Surgeon: Leandrew Koyanagi, MD;  Location: [place];  Service: Orthopedics;  Laterality: Right;   OPEN REDUCTION INTERNAL FIXATION (ORIF) TIBIA/FIBULA FRACTURE Right 07/25/2013   Procedure: OPEN REDUCTION INTERNAL FIXATION (ORIF) RIGHT TIBIAL PLATEAU, TIBIAL SHAFT AND FIBULA FRACTURES, POSSIBLE FASCIOTOMIES;  Surgeon: Marianna Payment, MD;  Location: [place];  Service: Orthopedics;  Laterality: Right;   TOTAL KNEE ARTHROPLASTY Right 07/15/2015   Procedure: RIGHT TOTAL KNEE ARTHROPLASTY;  Surgeon: Leandrew Koyanagi, MD;  Location: [place];  Service: Orthopedics;  Laterality: Right;  VASCULAR SURGERY      Family History  Problem Relation Age of Onset   Heart disease Other    Diabetes Father    Dementia Mother    Glaucoma Brother     Social History:  reports that he has been smoking cigarettes. He has been smoking about 0.25 packs per day. He has never used smokeless tobacco. He reports that he does not drink alcohol or use drugs.  Allergies:  Allergies  Allergen Reactions   Codeine Nausea And Vomiting    Pt unsure if  this is correct.    Medications:  Prior to Admission: (Not in a hospital admission)  Scheduled:  Continuous:  sodium chloride 20 mL/hr at 07/21/19 2105   PRN:  Results for orders placed or performed during the hospital encounter of 07/21/19 (from the past 48 hour(s))  CBC     Status: Abnormal   Collection Time: 07/21/19 10:13 PM  Result Value Ref Range   WBC 5.0 4.0 - 10.5 K/uL   RBC 3.13 (L) 4.22 - 5.81 MIL/uL   Hemoglobin 10.6 (L) 13.0 - 17.0 g/dL   HCT 31.7 (L) 39.0 - 52.0 %   MCV 101.3 (H) 80.0 - 100.0 fL   MCH 33.9 26.0 - 34.0 pg   MCHC 33.4 30.0 - 36.0 g/dL   RDW 15.7 (H) 11.5 - 15.5 %   Platelets 53 (L) 150 - 400 K/uL    Comment: REPEATED TO VERIFY PLATELET COUNT CONFIRMED BY SMEAR SPECIMEN CHECKED FOR CLOTS Immature Platelet Fraction may be clinically indicated, consider ordering this additional test [id number]    nRBC 0.0 0.0 - 0.2 %    Comment: Performed at [facility], [place] [address], [place], [place] [address]  CMET     Status: Abnormal   Collection Time: 07/21/19 10:13 PM  Result Value Ref Range   Sodium 134 (L) 135 - 145 mmol/L   Potassium 3.4 (L) 3.5 - 5.1 mmol/L   Chloride 109 98 - 111 mmol/L   CO2 20 (L) 22 - 32 mmol/L   Glucose, Bld 92 70 - 99 mg/dL   BUN 16 6 - 20 mg/dL   Creatinine, Ser 0.55 (L) 0.61 - 1.24 mg/dL   Calcium 7.5 (L) 8.9 - 10.3 mg/dL   Total Protein 5.1 (L) 6.5 - 8.1 g/dL   Albumin 2.2 (L) 3.5 - 5.0 g/dL   AST 55 (H) 15 - 41 U/L   ALT 48 (H) 0 - 44 U/L   Alkaline Phosphatase 135 (H) 38 - 126 U/L   Total Bilirubin 1.6 (H) 0.3 - 1.2 mg/dL   GFR calc non Af Amer >60 >60 mL/min   GFR calc Af Amer >60 >60 mL/min   Anion gap 5 5 - 15    Comment: Performed at [facility], [place] [address] [place], [place] [address]  PT/INR     Status: Abnormal   Collection Time: 07/21/19 10:13 PM  Result Value Ref Range   Prothrombin Time 17.6 (H) 11.4 - 15.2 seconds   INR 1.5 (H) 0.8 - 1.2    Comment:  (NOTE) INR goal varies based on device and disease states. Performed at [facility], [place] [address] [place],  [address]   UA     Status: None   Collection Time: 07/21/19 10:13 PM  Result Value Ref Range   Color, Urine YELLOW YELLOW   APPearance CLEAR CLEAR   Specific Gravity, Urine 1.009 1.005 - 1.030   pH 6.0  5.0 - 8.0   Glucose, UA NEGATIVE NEGATIVE mg/dL   Hgb urine dipstick NEGATIVE NEGATIVE   Bilirubin Urine NEGATIVE NEGATIVE   Ketones, ur NEGATIVE NEGATIVE mg/dL   Protein, ur NEGATIVE NEGATIVE mg/dL   Nitrite NEGATIVE NEGATIVE   Leukocytes,Ua NEGATIVE NEGATIVE    Comment: Performed at [place] [address], [place], [place] [address]  Type and screen     Status: None   Collection Time: 07/21/19 10:13 PM  Result Value Ref Range   ABO/RH(D) A POS    Antibody Screen NEG    Sample Expiration      07/24/2019,2359 Performed at [facility], [place] [address] [place], [place] [address]   Ammonia     Status: Abnormal   Collection Time: 07/21/19 10:13 PM  Result Value Ref Range   Ammonia 72 (H) 9 - 35 umol/L    Comment: Performed at [facility], [place] [address], [place], [place] [address]  Etoh     Status: None   Collection Time: 07/21/19 10:13 PM  Result Value Ref Range   Alcohol, Ethyl (B) <10 <10 mg/dL    Comment: (NOTE) Lowest detectable limit for serum alcohol is 10 mg/dL. For medical purposes only. Performed at [facility], [place] [address], [place], [place] [address]     Xr Chest Preop 1 View  Result Date: 07/21/2019 CLINICAL DATA:  53 year old male status post seizure and fall at home. EXAM: CHEST  1 VIEW COMPARISON:  Chest radiographs 04/30/2015 and earlier. FINDINGS: Portable AP supine view at 2121 hours. Externals zipper artifact suspected. Lung volumes and mediastinal contours remain normal. Visualized tracheal air column is within normal limits.  Allowing for portable technique the lungs are clear. Subacute appearing left lateral 9th rib fracture with some periosteal new bone formation. No superimposed No acute osseous abnormality identified. IMPRESSION: 1. Subacute appearing left 9th rib fracture. 2. No acute cardiopulmonary abnormality. Electronically Signed   By: Genevie Ann M.D.   On: 07/21/2019 21:47   Xr Wrist L  Result Date: 07/21/2019 CLINICAL DATA:  53 year old male status post seizure and fall. Wrist deformity. EXAM: LEFT WRIST - COMPLETE 3+ VIEW COMPARISON:  None. FINDINGS: Comminuted and dorsally impacted distal left radius fracture. DRU involvement. Mild radial displacement also. No definite radiocarpal involvement. Mildly comminuted and displaced ulnar styloid fracture. Carpal bone alignment within normal limits. Visible metacarpals intact. IMPRESSION: 1. Comminuted and dorsally impacted distal left radius fracture. 2. Ulnar styloid fracture. Electronically Signed   By: Genevie Ann M.D.   On: 07/21/2019 21:45   Ct Head Wo Contrast  Result Date: 07/21/2019 CLINICAL DATA:  53 year old male status post seizure and fall at home. Found down postictal. EXAM: CT HEAD WITHOUT CONTRAST TECHNIQUE: Contiguous axial images were obtained from the base of the skull through the vertex without intravenous contrast. COMPARISON:  Head CT 08/05/2011 and earlier. FINDINGS: Brain: Stable cerebral volume since 2012, chronic brainstem volume loss. No midline shift, ventriculomegaly, mass effect, evidence of mass lesion, intracranial hemorrhage or evidence of cortically based acute infarction. Gray-white matter differentiation is within normal limits throughout the brain. Vascular: No suspicious intracranial vascular hyperdensity. Skull: Stable, intact. Sinuses/Orbits: Visualized paranasal sinuses and mastoids are stable and well pneumatized. Other: Visualized orbits and scalp soft tissues are within normal limits. IMPRESSION: Stable since 2012. No acute  intracranial abnormality or acute traumatic injury identified. Electronically Signed   By: Genevie Ann M.D.   On: 07/21/2019 21:51   Ct Cervical Spine  Wo Contrast  Result Date: 07/21/2019 CLINICAL DATA:  53 year old male status post seizure and fall at home. Found down postictal. EXAM: CT CERVICAL SPINE WITHOUT CONTRAST TECHNIQUE: Multidetector CT imaging of the cervical spine was performed without intravenous contrast. Multiplanar CT image reconstructions were also generated. COMPARISON:  Head CT today reported separately. CT cervical spine 10/29/2016. FINDINGS: Alignment: Improved cervical lordosis since 2018. Mild levoconvex scoliosis. Cervicothoracic junction alignment is within normal limits. Bilateral posterior element alignment is within normal limits. Skull base and vertebrae: Visualized skull base is intact. No atlanto-occipital dissociation. No acute osseous abnormality identified. Soft tissues and spinal canal: No prevertebral fluid or swelling. No visible canal hematoma. Negative noncontrast neck soft tissues. Disc levels: Intermittent chronic disc and endplate degeneration, but capacious cervical spinal canal with no spinal stenosis suspected. Upper chest: Visible upper thoracic levels appear intact. Mild atelectasis in the lung apices. Trace retained secretions in the trachea on series 6, image 82. IMPRESSION: 1. No acute traumatic injury identified in the cervical spine. 2. Trace retained secretions in the trachea at the thoracic inlet. Electronically Signed   By: Genevie Ann M.D.   On: 07/21/2019 22:01   Xr Hip Left  Result Date: 07/21/2019 CLINICAL DATA:  53 year old male status post seizure and fall. Hip deformity. EXAM: DG HIP (WITH OR WITHOUT PELVIS) 2-3V LEFT COMPARISON:  CT Abdomen and Pelvis 12/24/2014. FINDINGS: Impacted left femoral neck fracture, possibly subcapital. This appears minimally displaced aside from the mild impaction. Left femoral head remains normally located. The  intertrochanteric segment appears to remain intact. Hip joint spaces are symmetric and normal for age. Grossly intact proximal right femur. Pelvis appears intact. Negative visible bowel gas pattern. IMPRESSION: Mildly impacted left femoral neck fracture. Electronically Signed   By: Genevie Ann M.D.   On: 07/21/2019 21:44    ROS Blood pressure 124/64, pulse 76, temperature (!) 97.5 F (36.4 C), temperature source Oral, resp. rate 14, height 6\' 1"  (1.854 m), weight 90.7 kg, SpO2 96 %. Physical Exam Physical Exam: General: Alert, no acute distress, thin male Cardiovascular: No pedal edema Respiratory: No cyanosis, no use of accessory musculature GI: No organomegaly, abdomen is soft and non-tender Skin: No lesions in the area of chief complaint Neurologic: Sensation intact distally Psychiatric: Patient is competent for consent with normal mood and affect Lymphatic: No axillary or cervical lymphadenopathy  Orthopaedic Exam: Left hand is warm and sensate, no numbness, radial a 2+ and ulna a 2+ mild ecchymosis left dorsoulnar wrist. Exquisitely tender left distal radius to any movement of The left wrist and forearm. Left leg is minimally externally rotated. Pulses left leg are normal both DP and PT. Sensation left leg is normal. Tender left proximal femur and any movement of the left hip. Shoulders, clavicle non tender, C-Spine is non tender right leg and thigh with normal appearance non tender to palpation. Urinal with urine no gross hematuria.  Assessment/Plan: 1)Closed left impacted shortened distal radius fracture with apex volar angulation and radial deviation, associated ulna styloid  Fracture, displaced. 2) Closed impacted left femoral neck fracture, nondisplaced slight varus. 3) Wilson's disease, cirrhosis with history of hepatocellular  Carcinoma treated and thromocytopenia chroniically platelet count 50K INR 1.5. 4)Bipolar affective disorder. 5)Osteoporosis 6)Seizure disorder. Plan: Ice  to the left wrist and elevate above the heart. A sugar tong splint applied to the the left forearm and wrist to stablilize. Left leg with 5# bucks traction for mainly decreasing use of the left leg, needs pillow under left calf to prevent decubitus  as he is not likely to be moving The left hip due to pain. Ice to the left hip to decrease pain and swelling. Dr. Erlinda Hong will be by to see this patient in the AM and Would hold NPO till that time.  Left wrist will likely require ORIF with volar plate and screws and bone graft. Left femoral neck fracture may be treated with multiple cannulated screws However the presence of osteoporosis increases the risk of loss of fixation, fracture table would be needed to improve the femoral neck position to neutral or slight Valgus to prevent progressive varus and loss of fixation.   Basil Dess 07/22/2019, 12:18 AM

## 2019-07-22 NOTE — Plan of Care (Signed)

## 2019-07-22 NOTE — H&P (Addendum)
[name] [id number] DOB: 03-06-1966 DOA: 07/21/2019     PCP: Biagio Borg, MD   Outpatient Specialists:     [facility]  Dr. Debarah Crape, MD    Patient arrived to ER on 07/21/19 at 1901  Patient coming from: home Lives alone,    Chief Complaint:  Chief Complaint  Patient presents with   Seizures   Injuries from seizure    HPI: [name] is a 53 y.o. male with medical history significant of alcoholic liver disease complicated by [place] disease, seizure disorder, bipolar, depression, dyslipidemia Ventral hernia,  Presented with   seizure resulting in the fall she was at witnessed he has history of seizures reports he has not taken his Keppra lately and was on his way to take his dose states he gets seizures whenever he forgets to take his Keppra.  Roommate found him postictal on the floor.  He was found to have left wrist pain and left hip pain.  Mass was called on arrival heart rate 72 satting 99% on room air CBG 144.  Patient states he has been trying to isolate himself no known contacts with sick individuals no shortness of breath no chest pain now or with exertion. Orts he has not been drinking for at least few months now.   Platelets down to 50 patient states that he does not wish.  To receive any blood product Patient states that he does not wish to receive platelet transfusion at this time based on his religious views  Infectious risk factors:  Reports fall   In  ER RAPID COVID TEST NEGATIVE in house testing  Pending  No results found for: SARSCOV2NAA   Regarding pertinent Chronic problems:   Seizure disorder on Keppra 1500 mg twice a day  History of cirrhosis on spironolactone Lasix 80 mg twice a day Xifaxan   While in ER: Loaded with Keppra IV.  Multiple imaging showed Left wrist fracture  Left hip fracture as well as ninth rib fracture The following Work up has been ordered so far:  Orders Placed This Encounter    Procedures   SARS CORONAVIRUS 2 (TAT 6-24 HRS) Nasopharyngeal Nasopharyngeal Swab   CT CERVICAL SPINE WO CONTRAST   CT HEAD WO CONTRAST   XR Hip Left   XR Wrist L   XR Chest Preop 1 View   CBC   CMET   PT/INR   UA   Ammonia   Etoh   Splint wrist   Cardiac monitoring   Consult to orthopedic surgery  ALL PATIENTS BEING ADMITTED/HAVING PROCEDURES NEED COVID-19 SCREENING   Consult to hospitalist  ALL PATIENTS BEING ADMITTED/HAVING PROCEDURES NEED COVID-19 SCREENING   Consult to social work   EKG 12-Lead   EKG 12-Lead   Type and screen   Admit to Inpatient (patient's expected length of stay will be greater than 2 midnights or inpatient only procedure)    Following Medications were ordered in ER: Medications  0.9 %  sodium chloride infusion ( Intravenous New Bag/Given 07/21/19 2105)  HYDROmorphone (DILAUDID) injection 1 mg (has no administration in time range)  HYDROmorphone (DILAUDID) injection 0.5 mg (0.5 mg Intravenous Given 07/21/19 2103)  ondansetron (ZOFRAN) injection 4 mg (4 mg Intravenous Given 07/21/19 2103)  levETIRAcetam (KEPPRA) IVPB 500 mg/100 mL premix (0 mg Intravenous Stopped 07/21/19 2308)        Consult Orders  (From admission, onward)         Start  Ordered   07/22/19 0004  Consult to social work  Once    Provider:  (Not yet assigned)  Question:  Reason for Consult:  Answer:  Nursing home placement   07/22/19 0005   07/21/19 2315  Consult to hospitalist  ALL PATIENTS BEING ADMITTED/HAVING PROCEDURES NEED COVID-19 SCREENING  Once    Comments: ALL PATIENTS BEING ADMITTED/HAVING PROCEDURES NEED COVID-19 SCREENING  Provider:  (Not yet assigned)  Question Answer Comment  Place call to: [facility]   Reason for Consult Admit      07/21/19 2314          ER Provider Called:  Orthopedics   Dr.Nitka They Recommend admit to medicine   Will see    in ER   Significant initial  Findings: Abnormal Labs Reviewed  CBC -  Abnormal; Notable for the following components:      Result Value   RBC 3.13 (*)    Hemoglobin 10.6 (*)    HCT 31.7 (*)    MCV 101.3 (*)    RDW 15.7 (*)    Platelets 53 (*)    All other components within normal limits  COMPREHENSIVE METABOLIC PANEL - Abnormal; Notable for the following components:   Sodium 134 (*)    Potassium 3.4 (*)    CO2 20 (*)    Creatinine, Ser 0.55 (*)    Calcium 7.5 (*)    Total Protein 5.1 (*)    Albumin 2.2 (*)    AST 55 (*)    ALT 48 (*)    Alkaline Phosphatase 135 (*)    Total Bilirubin 1.6 (*)    All other components within normal limits  PROTIME-INR - Abnormal; Notable for the following components:   Prothrombin Time 17.6 (*)    INR 1.5 (*)    All other components within normal limits  AMMONIA - Abnormal; Notable for the following components:   Ammonia 72 (*)    All other components within normal limits     Otherwise labs showing:    Recent Labs  Lab 07/21/19 2213  NA 134*  K 3.4*  CO2 20*  GLUCOSE 92  BUN 16  CREATININE 0.55*  CALCIUM 7.5*    Cr   stable,    Lab Results  Component Value Date   CREATININE 0.55 (L) 07/21/2019   CREATININE 0.71 06/11/2019   CREATININE 0.83 10/04/2018    Recent Labs  Lab 07/21/19 2213  AST 55*  ALT 48*  ALKPHOS 135*  BILITOT 1.6*  PROT 5.1*  ALBUMIN 2.2*   Lab Results  Component Value Date   CALCIUM 7.5 (L) 07/21/2019     WBC      Component Value Date/Time   WBC 5.0 07/21/2019 2213   ANC    Component Value Date/Time   NEUTROABS 3.9 06/11/2019 1128   ALC No components found for: LYMPHAB    Plt: Lab Results  Component Value Date   PLT 53 (L) 07/21/2019     Lactic Acid, Venous    Component Value Date/Time   LATICACIDVEN 1.5 08/05/2011 2004    COVID-19 Labs  No results for input(s): DDIMER, FERRITIN, LDH, CRP in the last 72 hours.  No results found for: SARSCOV2NAA    HG/HCT  Stable,     Component Value Date/Time   HGB 10.6 (L) 07/21/2019 2213   HCT 31.7  (L) 07/21/2019 2213    No results for input(s): LIPASE, AMYLASE in the last 168 hours. Recent Labs  Lab 07/21/19 2213  AMMONIA 72*    No components found for: LABALBU    ECG: Ordered Personally reviewed by me showing: HR : 79 Rhythm:  NSR,     no evidence of ischemic changes QTC 460      UA  no evidence of UTI     Urine analysis:    Component Value Date/Time   COLORURINE YELLOW 07/21/2019 2213   APPEARANCEUR CLEAR 07/21/2019 2213   LABSPEC 1.009 07/21/2019 2213   PHURINE 6.0 07/21/2019 [place] 07/21/2019 2213   GLUCOSEU NEGATIVE 06/11/2019 1128   [place] 07/21/2019 2213   [place] 07/21/2019 2213   KETONESUR NEGATIVE 07/21/2019 2213   PROTEINUR NEGATIVE 07/21/2019 2213   UROBILINOGEN 2.0 (A) 06/11/2019 1128   NITRITE NEGATIVE 07/21/2019 2213   LEUKOCYTESUR NEGATIVE 07/21/2019 2213      Ordered  CT HEAD NON acute Ct spine non traumatic CXR - subacute 9th rib fracture Left hip Mildly impacted left femoral neck fracture.     ED Triage Vitals  Enc Vitals Group     BP 07/21/19 1914 114/61     Pulse Rate 07/21/19 1914 73     Resp 07/21/19 1914 18     Temp 07/21/19 1914 (!) 97.5 F (36.4 C)     Temp Source 07/21/19 1914 Oral     SpO2 07/21/19 1912 99 %     Weight 07/21/19 1920 200 lb (90.7 kg)     Height 07/21/19 1920 6\' 1"  (1.854 m)     Head Circumference --      Peak Flow --      Pain Score 07/21/19 1917 10     Pain Loc --      Pain Edu? --      Excl. in [place]? --   TMAX(24)@       Latest  Blood pressure 124/64, pulse 76, temperature (!) 97.5 F (36.4 C), temperature source Oral, resp. rate 14, height 6\' 1"  (1.854 m), weight 90.7 kg, SpO2 96 %.     Hospitalist was called for admission for left hip fracture   Review of Systems:    Pertinent positives include: seizure  Constitutional:  No weight loss, night sweats, Fevers, chills, fatigue, weight loss  HEENT:  No headaches, Difficulty swallowing,Tooth/dental  problems,Sore throat,  No sneezing, itching, ear ache, nasal congestion, post nasal drip,  Cardio-vascular:  No chest pain, Orthopnea, PND, anasarca, dizziness, palpitations.no Bilateral lower extremity swelling  GI:  No heartburn, indigestion, abdominal pain, nausea, vomiting, diarrhea, change in bowel habits, loss of appetite, melena, blood in stool, hematemesis Resp:  no shortness of breath at rest. No dyspnea on exertion, No excess mucus, no productive cough, No non-productive cough, No coughing up of blood.No change in color of mucus.No wheezing. Skin:  no rash or lesions. No jaundice GU:  no dysuria, change in color of urine, no urgency or frequency. No straining to urinate.  No flank pain.  Musculoskeletal:  No joint pain or no joint swelling. No decreased range of motion. No back pain.  Psych:  No change in mood or affect. No depression or anxiety. No memory loss.  Neuro: no localizing neurological complaints, no tingling, no weakness, no double vision, no gait abnormality, no slurred speech, no confusion  All systems reviewed and apart from [place] all are negative  Past Medical History:   Past Medical History:  Diagnosis Date   Abuse, drug or alcohol ([place])    Anemia    Bipolar affective disorder ([place])  [place])    liver cancer treated with micrablation   Chronic liver disease and cirrhosis    Depression    DJD (degenerative joint disease)    right knee   Dyslipidemia    Encephalopathy    Esophageal varices ([facility])    Hernia    History of alcohol abuse    PONV (postoperative nausea and vomiting)    Seizures ([facility])    Dr Jannifer Franklin   Thrombocytopenia ([place])    Wilson disease    Wrist fracture    right      Past Surgical History:  Procedure Laterality Date   ESOPHAGOGASTRODUODENOSCOPY     HARDWARE REMOVAL Right 01/22/2015   Procedure: HARDWARE REMOVAL RIGHT LEG;  Surgeon: Leandrew Koyanagi, MD;  Location: [place];   Service: Orthopedics;  Laterality: Right;   IR PARACENTESIS  10/04/2018   IR PARACENTESIS  04/29/2019   left inguinal hernia  june 2012   [place]   liver cancer  02/2015   ablation   microablation of liver     OPEN REDUCTION INTERNAL FIXATION (ORIF) DISTAL RADIAL FRACTURE Right 12/21/2016   Procedure: OPEN REDUCTION INTERNAL FIXATION (ORIF) RIGHT DISTAL RADIUS FRACTURE;  Surgeon: Leandrew Koyanagi, MD;  Location: [place];  Service: Orthopedics;  Laterality: Right;   OPEN REDUCTION INTERNAL FIXATION (ORIF) TIBIA/FIBULA FRACTURE Right 07/25/2013   Procedure: OPEN REDUCTION INTERNAL FIXATION (ORIF) RIGHT TIBIAL PLATEAU, TIBIAL SHAFT AND FIBULA FRACTURES, POSSIBLE FASCIOTOMIES;  Surgeon: Marianna Payment, MD;  Location: [place];  Service: Orthopedics;  Laterality: Right;   TOTAL KNEE ARTHROPLASTY Right 07/15/2015   Procedure: RIGHT TOTAL KNEE ARTHROPLASTY;  Surgeon: Leandrew Koyanagi, MD;  Location: [place];  Service: Orthopedics;  Laterality: Right;   VASCULAR SURGERY      Social History:  Ambulatory   Independently      reports that he has been smoking cigarettes. He has been smoking about 0.25 packs per day. He has never used smokeless tobacco. He reports that he does not drink alcohol or use drugs.   Family History:   Family History  Problem Relation Age of Onset   Heart disease Other    Diabetes Father    Dementia Mother    Glaucoma Brother     Allergies: Allergies  Allergen Reactions   Codeine Nausea And Vomiting    Reports it makes me crazy.      Prior to Admission medications   Medication Sig Start Date End Date Taking? Authorizing Provider  cyclobenzaprine (FLEXERIL) 5 MG tablet Take 1 tablet (5 mg total) by mouth at bedtime. Patient taking differently: Take 5 mg by mouth daily at 12 noon.  04/29/19  Yes Biagio Borg, MD  furosemide (LASIX) 40 MG tablet Take 2 tablets (80 mg total) by mouth 2 (two) times daily. 04/29/19  Yes Biagio Borg, MD  levETIRAcetam  (KEPPRA) 1000 MG tablet Take 1 tablet (1,000 mg total) by mouth 2 (two) times daily. Patient taking differently: Take 1,500 mg by mouth 2 (two) times daily.  04/29/19  Yes Biagio Borg, MD  potassium chloride (K-DUR) 10 MEQ tablet Take 1 tablet (10 mEq total) by mouth 2 (two) times daily. 04/29/19  Yes Biagio Borg, MD  POTASSIUM CITRATE PO Take 200 mg by mouth daily.   Yes [provider]  spironolactone (ALDACTONE) 100 MG tablet Take 2 tablets (200 mg total) by mouth every morning. 04/29/19  Yes Biagio Borg, MD  tadalafil (  CIALIS) 5 MG tablet Take 5 mg by mouth daily as needed for erectile dysfunction.    Yes [provider]  XIFAXAN 550 MG TABS tablet Take 1 tablet (550 mg total) by mouth 3 (three) times daily. Take 550 mg by mouth 3 (three) times daily. 04/29/19  Yes Biagio Borg, MD  zinc gluconate 50 MG tablet Take 50 mg by mouth 3 (three) times daily.    Yes [provider]  folic acid (FOLVITE) 1 MG tablet Take 1 tablet (1 mg total) by mouth daily. Take 1 mg by mouth daily. Patient not taking: Reported on 07/22/2019 04/29/19   Biagio Borg, MD   Physical Exam: Blood pressure 124/64, pulse 76, temperature (!) 97.5 F (36.4 C), temperature source Oral, resp. rate 14, height 6\' 1"  (1.854 m), weight 90.7 kg, SpO2 96 %. 1. General:  in No  Acute distress   Chronically ill  -appearing 2. Psychological: Alert and   Oriented 3. Head/ENT:   Dry Mucous Membranes                          Head Non traumatic, neck supple                            Poor Dentition 4. SKIN:   decreased Skin turgor,  Skin clean Dry and intact no rash 5. Heart: Regular rate and rhythm no  Murmur, no Rub or gallop 6. Lungs:  Clear to auscultation bilaterally, no wheezes or crackles   7. Abdomen: Soft,  non-tender,   distended  bowel sounds present, abdominal wall hernia noted  8. Lower extremities: no clubbing, cyanosis, no edema, scrotal edema present 9. Neurologically Grossly intact,  moving all 4 extremities equally   10. MSK: Normal range of motion   All other LABS:     Recent Labs  Lab 07/21/19 2213  WBC 5.0  HGB 10.6*  HCT 31.7*  MCV 101.3*  PLT 53*     Recent Labs  Lab 07/21/19 2213  NA 134*  K 3.4*  CL 109  CO2 20*  GLUCOSE 92  BUN 16  CREATININE 0.55*  CALCIUM 7.5*     Recent Labs  Lab 07/21/19 2213  AST 55*  ALT 48*  ALKPHOS 135*  BILITOT 1.6*  PROT 5.1*  ALBUMIN 2.2*    Cultures:    Component Value Date/Time   SDES PERITONEAL 10/04/2018 0940   SDES PERITONEAL 10/04/2018 0940   SPECREQUEST  10/04/2018 0940    NONE Performed at [facility], [place] [address], [place], [place] [address]    [place] 10/04/2018 0940   CULT  10/04/2018 0940    NO GROWTH 5 DAYS Performed at [facility], [place] [address] [place], [place] [address]    REPTSTATUS 10/04/2018 FINAL 10/04/2018 0940   REPTSTATUS 10/09/2018 FINAL 10/04/2018 0940     Radiological Exams on Admission: Xr Chest Preop 1 View  Result Date: 07/21/2019 CLINICAL DATA:  53 year old male status post seizure and fall at home. EXAM: CHEST  1 VIEW COMPARISON:  Chest radiographs 04/30/2015 and earlier. FINDINGS: Portable AP supine view at 2121 hours. Externals zipper artifact suspected. Lung volumes and mediastinal contours remain normal. Visualized tracheal air column is within normal limits. Allowing for portable technique the lungs are clear. Subacute appearing left lateral 9th rib fracture with some periosteal new bone formation. No superimposed No acute osseous abnormality identified.  IMPRESSION: 1. Subacute appearing left 9th rib fracture. 2. No acute cardiopulmonary abnormality. Electronically Signed   By: Genevie Ann M.D.   On: 07/21/2019 21:47   Xr Wrist L  Result Date: 07/21/2019 CLINICAL DATA:  53 year old male status post seizure and fall. Wrist deformity. EXAM: LEFT WRIST - COMPLETE 3+ VIEW COMPARISON:  None. FINDINGS: Comminuted and dorsally impacted distal  left radius fracture. DRU involvement. Mild radial displacement also. No definite radiocarpal involvement. Mildly comminuted and displaced ulnar styloid fracture. Carpal bone alignment within normal limits. Visible metacarpals intact. IMPRESSION: 1. Comminuted and dorsally impacted distal left radius fracture. 2. Ulnar styloid fracture. Electronically Signed   By: Genevie Ann M.D.   On: 07/21/2019 21:45   Ct Head Wo Contrast  Result Date: 07/21/2019 CLINICAL DATA:  53 year old male status post seizure and fall at home. Found down postictal. EXAM: CT HEAD WITHOUT CONTRAST TECHNIQUE: Contiguous axial images were obtained from the base of the skull through the vertex without intravenous contrast. COMPARISON:  Head CT 08/05/2011 and earlier. FINDINGS: Brain: Stable cerebral volume since 2012, chronic brainstem volume loss. No midline shift, ventriculomegaly, mass effect, evidence of mass lesion, intracranial hemorrhage or evidence of cortically based acute infarction. Gray-white matter differentiation is within normal limits throughout the brain. Vascular: No suspicious intracranial vascular hyperdensity. Skull: Stable, intact. Sinuses/Orbits: Visualized paranasal sinuses and mastoids are stable and well pneumatized. Other: Visualized orbits and scalp soft tissues are within normal limits. IMPRESSION: Stable since 2012. No acute intracranial abnormality or acute traumatic injury identified. Electronically Signed   By: Genevie Ann M.D.   On: 07/21/2019 21:51   Ct Cervical Spine Wo Contrast  Result Date: 07/21/2019 CLINICAL DATA:  53 year old male status post seizure and fall at home. Found down postictal. EXAM: CT CERVICAL SPINE WITHOUT CONTRAST TECHNIQUE: Multidetector CT imaging of the cervical spine was performed without intravenous contrast. Multiplanar CT image reconstructions were also generated. COMPARISON:  Head CT today reported separately. CT cervical spine 10/29/2016. FINDINGS: Alignment: Improved cervical  lordosis since 2018. Mild levoconvex scoliosis. Cervicothoracic junction alignment is within normal limits. Bilateral posterior element alignment is within normal limits. Skull base and vertebrae: Visualized skull base is intact. No atlanto-occipital dissociation. No acute osseous abnormality identified. Soft tissues and spinal canal: No prevertebral fluid or swelling. No visible canal hematoma. Negative noncontrast neck soft tissues. Disc levels: Intermittent chronic disc and endplate degeneration, but capacious cervical spinal canal with no spinal stenosis suspected. Upper chest: Visible upper thoracic levels appear intact. Mild atelectasis in the lung apices. Trace retained secretions in the trachea on series 6, image 82. IMPRESSION: 1. No acute traumatic injury identified in the cervical spine. 2. Trace retained secretions in the trachea at the thoracic inlet. Electronically Signed   By: Genevie Ann M.D.   On: 07/21/2019 22:01   Xr Hip Left  Result Date: 07/21/2019 CLINICAL DATA:  53 year old male status post seizure and fall. Hip deformity. EXAM: DG HIP (WITH OR WITHOUT PELVIS) 2-3V LEFT COMPARISON:  CT Abdomen and Pelvis 12/24/2014. FINDINGS: Impacted left femoral neck fracture, possibly subcapital. This appears minimally displaced aside from the mild impaction. Left femoral head remains normally located. The intertrochanteric segment appears to remain intact. Hip joint spaces are symmetric and normal for age. Grossly intact proximal right femur. Pelvis appears intact. Negative visible bowel gas pattern. IMPRESSION: Mildly impacted left femoral neck fracture. Electronically Signed   By: Genevie Ann M.D.   On: 07/21/2019 21:44    Chart has been reviewed    Assessment/Plan  53 y.o. male with medical history significant of alcoholic liver disease complicated by Wilson disease, seizure disorder, bipolar, depression, dyslipidemia Ventral hernia, Admitted for left hip fracture  Present on Admission:   Closed left hip fracture ([place])-  - management as per orthopedics,  plan to operate  in  [place]. if able     Keep nothing by mouth post midnight. Patient  not on anticoagulation or antiplatelet agents     Ordered type and screen, order a vitamin D level  Placed Foley patient pulled out will be able to tolerate condom cath  Patient at baseline  able to walk a flight of stairs or 100 feet    Patient denies any chest pain or shortness of breath currently and/or with exertion,  ECG showing no evidence of acute ischemia  known history of   Liver disease  Given hx of chronic disease patient is at least moderate  risk    at this point no furthther cardiac workup is indicated.     Hypokalemia - will replace   Bipolar affective disorder ([place]) chronic currently stable   Wilson disease -noting chronic liver disease followed by [facility] currently stable   History of alcohol abuse -currently in remission no evidence of alcohol withdrawal   Left wrist fracture as per orthopedics appreciate their care   Rib fracture -conservative measures control incentive spirometry  Thrombocytopenia -orthopedics aware patient at this point declines blood product transfusion continue to monitor   History of seizure disorder with recent seizure secondary to noncompliance with Keppra.  Will restart Keppra keep IV while patient n.p.o. seizure precautions   Other plan as per orders.  DVT prophylaxis:  SCD    Code Status:  FULL CODE as per patient   I had personally discussed CODE STATUS with patient   Family Communication:   Family not at  Bedside   Disposition Plan:     likely will need placement for rehabilitation                                  Would benefit from PT/OT eval prior to DC                                      Consults called: orthopedics    Admission status:  ED Disposition    ED Disposition Condition [place]: [place] [[id number]]  Level of  Care: Telemetry [5]  Admit to tele based on following criteria: Other see comments  Comments: hypokalemia  Covid Evaluation: Asymptomatic Screening Protocol (No Symptoms)  Diagnosis: Closed left hip fracture [place]:[id number]  Admitting Physician: Toy Baker [[id number]]  Attending Physician: Toy Baker [[id number]]  Estimated length of stay: 3 - 4 days  Certification:: I certify this patient will need inpatient services for at least 2 midnights  PT Class (Do Not Modify): Inpatient [101]  PT Acc Code (Do Not Modify): Private [1]         inpatient     Expect 2 midnight stay secondary to severity of patient's current illness including     Severe lab/radiological/exam abnormalities including:  Left hip fracture   and extensive comorbidities including:  substance abuse     liver disease   That are currently affecting medical management.   I expect  patient to be hospitalized for 2  midnights requiring inpatient medical care.  Patient is at high risk for adverse outcome (such as loss of life or disability) if not treated.  Indication for inpatient stay as follows:    severe pain requiring acute inpatient management,  inability to maintain oral hydration    Need for operative/procedural  intervention   Need for   IV pain medications     Level of care     tele  For 12H   Precautions:  NONE  No active isolations  PPE: Used by the provider:   P100  eye Goggles,  Gloves    Ontario Pettengill 07/22/2019, 12:29 AM    [facility]     after 2 AM please page floor coverage PA If 7AM-7PM, please contact the day team taking care of the patient using [email or web address]

## 2019-07-22 NOTE — ED Notes (Signed)
ED TO INPATIENT HANDOFF REPORT  Name/Age/Gender [name] 53 y.o. male  Code Status Code Status History    Date Active Date Inactive Code Status Order ID Comments User Context   10/29/2016 2255 10/30/2016 2146 Full Code [id number]  Rexanne Mano, MD Inpatient   07/15/2015 1819 07/17/2015 1621 Full Code [id number]  Leandrew Koyanagi, MD Inpatient   07/26/2013 0255 08/04/2013 1834 Full Code [id number]  Marianna Payment, MD Inpatient   07/24/2013 2132 07/26/2013 0255 Full Code [id number]  Marin Shutter, MD ED   08/06/2011 0139 08/06/2011 1428 Full Code [id number]  Marguerita Beards, RN Inpatient   Advance Care Planning Activity      Home/SNF/Other Home  Chief Complaint Seizures and Injury with a Fall  Level of Care/Admitting Diagnosis ED Disposition    ED Disposition Condition [place] [facility] Area: [facility] [[id number]]  Level of Care: Telemetry [5]  Admit to tele based on following criteria: Other see comments  Comments: hypokalemia  Covid Evaluation: Asymptomatic Screening Protocol (No Symptoms)  Diagnosis: Closed left hip fracture [facility]) [id number]  Admitting Physician: Toy Baker [[id number]]  Attending Physician: Toy Baker [[id number]]  Estimated length of stay: 3 - 4 days  Certification:: I certify this patient will need inpatient services for at least 2 midnights  PT Class (Do Not Modify): Inpatient [101]  PT Acc Code (Do Not Modify): Private [1]       Medical History Past Medical History:  Diagnosis Date  . Abuse, drug or alcohol ([place])   . Anemia   . Bipolar affective disorder ([place])    Cope  . Cancer [facility])    liver cancer treated with micrablation  . Chronic liver disease and cirrhosis   . Depression   . DJD (degenerative joint disease)    right knee  . Dyslipidemia   . Encephalopathy   . Esophageal varices ([place])   . Hernia   . History of alcohol abuse   . PONV (postoperative nausea and vomiting)    . Seizures ([place])    Dr Jannifer Franklin  . Thrombocytopenia ([place])   . Wilson disease   . Wrist fracture    right    Allergies Allergies  Allergen Reactions  . Codeine Nausea And Vomiting    Reports it makes me crazy.     IV Location/Drains/Wounds Patient Lines/Drains/Airways Status   Active Line/Drains/Airways    Name:   Placement date:   Placement time:   Site:   Days:   Peripheral IV 07/21/19 Posterior;Right Hand   07/21/19    1915    Hand   1   Incision 07/25/13 Leg Right   07/25/13    2317     2188   Incision (Closed) 01/22/15 Leg Right   01/22/15    1240     1642   Incision (Closed) 07/15/15 Knee Right   07/15/15    1557     1468   Incision (Closed) 12/21/16 Arm Right   12/21/16    1659     943          Labs/Imaging Results for orders placed or performed during the hospital encounter of 07/21/19 (from the past 48 hour(s))  CBC     Status: Abnormal   Collection Time: 07/21/19 10:13 PM  Result Value Ref Range   WBC 5.0 4.0 - 10.5 K/uL   RBC 3.13 (L) 4.22 - 5.81 MIL/uL   Hemoglobin 10.6 (L) 13.0 - 17.0  g/dL   HCT 31.7 (L) 39.0 - 52.0 %   MCV 101.3 (H) 80.0 - 100.0 fL   MCH 33.9 26.0 - 34.0 pg   MCHC 33.4 30.0 - 36.0 g/dL   RDW 15.7 (H) 11.5 - 15.5 %   Platelets 53 (L) 150 - 400 K/uL    Comment: REPEATED TO VERIFY PLATELET COUNT CONFIRMED BY SMEAR SPECIMEN CHECKED FOR CLOTS Immature Platelet Fraction may be clinically indicated, consider ordering this additional test [id number]    nRBC 0.0 0.0 - 0.2 %    Comment: Performed at [facility], [place] [address] [place], [place] [address]  CMET     Status: Abnormal   Collection Time: 07/21/19 10:13 PM  Result Value Ref Range   Sodium 134 (L) 135 - 145 mmol/L   Potassium 3.4 (L) 3.5 - 5.1 mmol/L   Chloride 109 98 - 111 mmol/L   CO2 20 (L) 22 - 32 mmol/L   Glucose, Bld 92 70 - 99 mg/dL   BUN 16 6 - 20 mg/dL   Creatinine, Ser 0.55 (L) 0.61 - 1.24 mg/dL   Calcium 7.5 (L) 8.9 - 10.3 mg/dL   Total Protein  5.1 (L) 6.5 - 8.1 g/dL   Albumin 2.2 (L) 3.5 - 5.0 g/dL   AST 55 (H) 15 - 41 U/L   ALT 48 (H) 0 - 44 U/L   Alkaline Phosphatase 135 (H) 38 - 126 U/L   Total Bilirubin 1.6 (H) 0.3 - 1.2 mg/dL   GFR calc non Af Amer >60 >60 mL/min   GFR calc Af Amer >60 >60 mL/min   Anion gap 5 5 - 15    Comment: Performed at [facility], [place] [address], [place], [place] [address]  PT/INR     Status: Abnormal   Collection Time: 07/21/19 10:13 PM  Result Value Ref Range   Prothrombin Time 17.6 (H) 11.4 - 15.2 seconds   INR 1.5 (H) 0.8 - 1.2    Comment: (NOTE) INR goal varies based on device and disease states. Performed at [facility], [place] [address], [place], [place] [address]   UA     Status: None   Collection Time: 07/21/19 10:13 PM  Result Value Ref Range   Color, Urine YELLOW YELLOW   APPearance CLEAR CLEAR   Specific Gravity, Urine 1.009 1.005 - 1.030   pH 6.0 5.0 - 8.0   Glucose, UA NEGATIVE NEGATIVE mg/dL   Hgb urine dipstick NEGATIVE NEGATIVE   Bilirubin Urine NEGATIVE NEGATIVE   Ketones, ur NEGATIVE NEGATIVE mg/dL   Protein, ur NEGATIVE NEGATIVE mg/dL   Nitrite NEGATIVE NEGATIVE   Leukocytes,Ua NEGATIVE NEGATIVE    Comment: Performed at [facility], [place] [address] [place], [place] [address]  Type and screen     Status: None   Collection Time: 07/21/19 10:13 PM  Result Value Ref Range   ABO/RH(D) A POS    Antibody Screen NEG    Sample Expiration      07/24/2019,2359 Performed at [facility], [place] [address], [place], [place] [address]   Ammonia     Status: Abnormal   Collection Time: 07/21/19 10:13 PM  Result Value Ref Range   Ammonia 72 (H) 9 - 35 umol/L    Comment: Performed at [facility], [place] [address], [place],  [address]  Etoh     Status: None   Collection Time: 07/21/19 10:13 PM  Result Value Ref Range  Alcohol, Ethyl (B) <10 <10 mg/dL    Comment:  (NOTE) Lowest detectable limit for serum alcohol is 10 mg/dL. For medical purposes only. Performed at [facility], [place] [address], [place], [place] [address]    Xr Chest Preop 1 View  Result Date: 07/21/2019 CLINICAL DATA:  53 year old male status post seizure and fall at home. EXAM: CHEST  1 VIEW COMPARISON:  Chest radiographs 04/30/2015 and earlier. FINDINGS: Portable AP supine view at 2121 hours. Externals zipper artifact suspected. Lung volumes and mediastinal contours remain normal. Visualized tracheal air column is within normal limits. Allowing for portable technique the lungs are clear. Subacute appearing left lateral 9th rib fracture with some periosteal new bone formation. No superimposed No acute osseous abnormality identified. IMPRESSION: 1. Subacute appearing left 9th rib fracture. 2. No acute cardiopulmonary abnormality. Electronically Signed   By: Genevie Ann M.D.   On: 07/21/2019 21:47   Xr Wrist L  Result Date: 07/21/2019 CLINICAL DATA:  53 year old male status post seizure and fall. Wrist deformity. EXAM: LEFT WRIST - COMPLETE 3+ VIEW COMPARISON:  None. FINDINGS: Comminuted and dorsally impacted distal left radius fracture. DRU involvement. Mild radial displacement also. No definite radiocarpal involvement. Mildly comminuted and displaced ulnar styloid fracture. Carpal bone alignment within normal limits. Visible metacarpals intact. IMPRESSION: 1. Comminuted and dorsally impacted distal left radius fracture. 2. Ulnar styloid fracture. Electronically Signed   By: Genevie Ann M.D.   On: 07/21/2019 21:45   Ct Head Wo Contrast  Result Date: 07/21/2019 CLINICAL DATA:  53 year old male status post seizure and fall at home. Found down postictal. EXAM: CT HEAD WITHOUT CONTRAST TECHNIQUE: Contiguous axial images were obtained from the base of the skull through the vertex without intravenous contrast. COMPARISON:  Head CT 08/05/2011 and earlier. FINDINGS: Brain: Stable  cerebral volume since 2012, chronic brainstem volume loss. No midline shift, ventriculomegaly, mass effect, evidence of mass lesion, intracranial hemorrhage or evidence of cortically based acute infarction. Gray-white matter differentiation is within normal limits throughout the brain. Vascular: No suspicious intracranial vascular hyperdensity. Skull: Stable, intact. Sinuses/Orbits: Visualized paranasal sinuses and mastoids are stable and well pneumatized. Other: Visualized orbits and scalp soft tissues are within normal limits. IMPRESSION: Stable since 2012. No acute intracranial abnormality or acute traumatic injury identified. Electronically Signed   By: Genevie Ann M.D.   On: 07/21/2019 21:51   Ct Cervical Spine Wo Contrast  Result Date: 07/21/2019 CLINICAL DATA:  53 year old male status post seizure and fall at home. Found down postictal. EXAM: CT CERVICAL SPINE WITHOUT CONTRAST TECHNIQUE: Multidetector CT imaging of the cervical spine was performed without intravenous contrast. Multiplanar CT image reconstructions were also generated. COMPARISON:  Head CT today reported separately. CT cervical spine 10/29/2016. FINDINGS: Alignment: Improved cervical lordosis since 2018. Mild levoconvex scoliosis. Cervicothoracic junction alignment is within normal limits. Bilateral posterior element alignment is within normal limits. Skull base and vertebrae: Visualized skull base is intact. No atlanto-occipital dissociation. No acute osseous abnormality identified. Soft tissues and spinal canal: No prevertebral fluid or swelling. No visible canal hematoma. Negative noncontrast neck soft tissues. Disc levels: Intermittent chronic disc and endplate degeneration, but capacious cervical spinal canal with no spinal stenosis suspected. Upper chest: Visible upper thoracic levels appear intact. Mild atelectasis in the lung apices. Trace retained secretions in the trachea on series 6, image 82. IMPRESSION: 1. No acute traumatic  injury identified in the cervical spine. 2. Trace retained secretions in the trachea at the thoracic inlet. Electronically Signed   By: Lemmie Evens  [place] Crane M.D.   On: 07/21/2019 22:01   Xr Hip Left  Result Date: 07/21/2019 CLINICAL DATA:  53 year old male status post seizure and fall. Hip deformity. EXAM: DG HIP (WITH OR WITHOUT PELVIS) 2-3V LEFT COMPARISON:  CT Abdomen and Pelvis 12/24/2014. FINDINGS: Impacted left femoral neck fracture, possibly subcapital. This appears minimally displaced aside from the mild impaction. Left femoral head remains normally located. The intertrochanteric segment appears to remain intact. Hip joint spaces are symmetric and normal for age. Grossly intact proximal right femur. Pelvis appears intact. Negative visible bowel gas pattern. IMPRESSION: Mildly impacted left femoral neck fracture. Electronically Signed   By: Genevie Ann M.D.   On: 07/21/2019 21:44    Pending Labs Unresulted Labs (From admission, onward)    Start     Ordered   07/21/19 2320  SARS CORONAVIRUS 2 (TAT 6-24 HRS) Nasopharyngeal Nasopharyngeal Swab  (Asymptomatic/Tier 2 Patients Labs)  Once,   STAT    Question Answer Comment  Is this test for diagnosis or screening Screening   Symptomatic for COVID-19 as defined by CDC No   Hospitalized for COVID-19 No   Admitted to ICU for COVID-19 No   Previously tested for COVID-19 No   Resident in a congregate (group) care setting No   Employed in healthcare setting No      07/21/19 2319   Signed and Held  HIV Antibody (routine testing w rflx)  (HIV Antibody (Routine testing w reflex) panel)  Tomorrow morning,   R     Signed and Held   Signed and Held  CBC  Tomorrow morning,   R     Signed and Held   Signed and Held  Basic metabolic panel  Tomorrow morning,   R     Signed and Held   Signed and Held  VITAMIN D 25 Hydroxy (Vit-D Deficiency, Fractures)  Add-on,   R     Signed and Held          Vitals/Pain Today's Vitals   07/21/19 1917 07/21/19 1920 07/21/19 2225  07/22/19 0000  BP:   130/67 124/64  Pulse:   81 76  Resp:   15 14  Temp:      TempSrc:      SpO2:   99% 96%  Weight:  90.7 kg    Height:  6\' 1"  (1.854 m)    PainSc: 10-Worst pain ever       Isolation Precautions No active isolations  Medications Medications  0.9 %  sodium chloride infusion ( Intravenous New Bag/Given 07/21/19 2105)  HYDROmorphone (DILAUDID) injection 0.5 mg (0.5 mg Intravenous Given 07/21/19 2103)  ondansetron (ZOFRAN) injection 4 mg (4 mg Intravenous Given 07/21/19 2103)  levETIRAcetam (KEPPRA) IVPB 500 mg/100 mL premix (0 mg Intravenous Stopped 07/21/19 2308)  HYDROmorphone (DILAUDID) injection 1 mg (1 mg Intravenous Given 07/22/19 0027)    Mobility walks

## 2019-07-22 NOTE — Progress Notes (Signed)
Patient request call to be placed to brother [phone]. Spoke to [place], questions concerns denied. Phone number updated chart.

## 2019-07-23 ENCOUNTER — Inpatient Hospital Stay (HOSPITAL_COMMUNITY): Payer: Medicare Other | Admitting: Anesthesiology

## 2019-07-23 ENCOUNTER — Encounter (HOSPITAL_COMMUNITY): Payer: Self-pay | Admitting: Anesthesiology

## 2019-07-23 ENCOUNTER — Encounter (HOSPITAL_COMMUNITY)
Admission: EM | Disposition: A | Discharge status: Skilled Nursing Facility | Payer: Self-pay | Source: Home / Self Care | Attending: Internal Medicine

## 2019-07-23 ENCOUNTER — Inpatient Hospital Stay (HOSPITAL_COMMUNITY): Payer: Medicare Other

## 2019-07-23 DIAGNOSIS — S52502A Unspecified fracture of the lower end of left radius, initial encounter for closed fracture: Secondary | ICD-10-CM

## 2019-07-23 DIAGNOSIS — S72002A Fracture of unspecified part of neck of left femur, initial encounter for closed fracture: Secondary | ICD-10-CM

## 2019-07-23 DIAGNOSIS — S52602A Unspecified fracture of lower end of left ulna, initial encounter for closed fracture: Secondary | ICD-10-CM

## 2019-07-23 HISTORY — PX: ORIF WRIST FRACTURE: SHX2133

## 2019-07-23 HISTORY — PX: TOTAL HIP ARTHROPLASTY: SHX124

## 2019-07-23 LAB — COMPREHENSIVE METABOLIC PANEL
ALT: 49 U/L — ABNORMAL HIGH (ref 0–44)
AST: 53 U/L — ABNORMAL HIGH (ref 15–41)
Albumin: 2.3 g/dL — ABNORMAL LOW (ref 3.5–5.0)
Alkaline Phosphatase: 147 U/L — ABNORMAL HIGH (ref 38–126)
Anion gap: 6 (ref 5–15)
BUN: 15 mg/dL (ref 6–20)
CO2: 25 mmol/L (ref 22–32)
Calcium: 9.1 mg/dL (ref 8.9–10.3)
Chloride: 104 mmol/L (ref 98–111)
Creatinine, Ser: 0.72 mg/dL (ref 0.61–1.24)
GFR calc Af Amer: >60 mL/min (ref >60)
GFR calc non Af Amer: >60 mL/min (ref >60)
Glucose, Bld: 104 mg/dL — ABNORMAL HIGH (ref 70–99)
Potassium: 4.1 mmol/L (ref 3.5–5.1)
Sodium: 135 mmol/L (ref 135–145)
Total Bilirubin: 2.8 mg/dL — ABNORMAL HIGH (ref 0.3–1.2)
Total Protein: 5.6 g/dL — ABNORMAL LOW (ref 6.5–8.1)

## 2019-07-23 LAB — CBC WITH DIFFERENTIAL/PLATELET
Abs Immature Granulocytes: 0.03 10*3/uL (ref 0.00–0.07)
Basophils Absolute: 0.1 10*3/uL (ref 0.0–0.1)
Basophils Relative: 1 %
Eosinophils Absolute: 0.1 10*3/uL (ref 0.0–0.5)
Eosinophils Relative: 1 %
HCT: 35 % — ABNORMAL LOW (ref 39.0–52.0)
Hemoglobin: 11.9 g/dL — ABNORMAL LOW (ref 13.0–17.0)
Immature Granulocytes: 0 %
Lymphocytes Relative: 8 %
Lymphs Abs: 0.6 10*3/uL — ABNORMAL LOW (ref 0.7–4.0)
MCH: 33.5 pg (ref 26.0–34.0)
MCHC: 34 g/dL (ref 30.0–36.0)
MCV: 98.6 fL (ref 80.0–100.0)
Monocytes Absolute: 0.9 10*3/uL (ref 0.1–1.0)
Monocytes Relative: 13 %
Neutro Abs: 5.4 10*3/uL (ref 1.7–7.7)
Neutrophils Relative %: 77 %
Platelets: 56 10*3/uL — ABNORMAL LOW (ref 150–400)
RBC: 3.55 MIL/uL — ABNORMAL LOW (ref 4.22–5.81)
RDW: 15.7 % — ABNORMAL HIGH (ref 11.5–15.5)
WBC: 7.1 10*3/uL (ref 4.0–10.5)
nRBC: 0 % (ref 0.0–0.2)

## 2019-07-23 LAB — VITAMIN D 25 HYDROXY (VIT D DEFICIENCY, FRACTURES): Vit D, 25-Hydroxy: 17.5 ng/mL — ABNORMAL LOW (ref 30–100)

## 2019-07-23 LAB — AMMONIA: Ammonia: 70 umol/L — ABNORMAL HIGH (ref 9–35)

## 2019-07-23 LAB — NO BLOOD PRODUCTS

## 2019-07-23 LAB — TYPE AND SCREEN
ABO/RH(D): A POS
Antibody Screen: NEGATIVE

## 2019-07-23 LAB — HIV ANTIBODY (ROUTINE TESTING W REFLEX): HIV Screen 4th Generation wRfx: NONREACTIVE — AB

## 2019-07-23 SURGERY — ARTHROPLASTY, HIP, TOTAL, ANTERIOR APPROACH
Anesthesia: General | Site: Wrist | Laterality: Left

## 2019-07-23 MED ORDER — ACETAMINOPHEN 160 MG/5ML PO SOLN: 325.0000 mg | ORAL | Status: DC | PRN

## 2019-07-23 MED ORDER — MEPERIDINE HCL 25 MG/ML IJ SOLN: 6.2500 mg | INTRAMUSCULAR | Status: DC | PRN

## 2019-07-23 MED ORDER — VANCOMYCIN HCL 1000 MG IV SOLR
INTRAVENOUS | Status: AC
  Filled 2019-07-23: qty 1000

## 2019-07-23 MED ORDER — PHENOL 1.4 % MT LIQD: 1.0000 | OROMUCOSAL | Status: DC | PRN

## 2019-07-23 MED ORDER — ONDANSETRON HCL 4 MG/2ML IJ SOLN
INTRAMUSCULAR | Status: DC | PRN
  Administered 2019-07-23: 4 mg via INTRAVENOUS

## 2019-07-23 MED ORDER — TRANEXAMIC ACID-NACL 1000-0.7 MG/100ML-% IV SOLN
INTRAVENOUS | Status: AC
  Filled 2019-07-23: qty 100

## 2019-07-23 MED ORDER — ONDANSETRON HCL 4 MG PO TABS: 4.0000 mg | ORAL_TABLET | Freq: Four times a day (QID) | ORAL | Status: DC | PRN

## 2019-07-23 MED ORDER — ONDANSETRON HCL 4 MG/2ML IJ SOLN: 4.0000 mg | Freq: Four times a day (QID) | INTRAMUSCULAR | Status: DC | PRN

## 2019-07-23 MED ORDER — DOCUSATE SODIUM 100 MG PO CAPS
100.0000 mg | ORAL_CAPSULE | Freq: Two times a day (BID) | ORAL | Status: DC
  Administered 2019-07-23 – 2019-07-29 (×12): 100 mg via ORAL
  Filled 2019-07-23 (×13): qty 1

## 2019-07-23 MED ORDER — ONDANSETRON HCL 4 MG/2ML IJ SOLN
INTRAMUSCULAR | Status: AC
  Filled 2019-07-23: qty 2

## 2019-07-23 MED ORDER — ROCURONIUM BROMIDE 10 MG/ML (PF) SYRINGE
PREFILLED_SYRINGE | INTRAVENOUS | Status: AC
  Filled 2019-07-23: qty 10

## 2019-07-23 MED ORDER — ACETAMINOPHEN 325 MG PO TABS: 325.0000 mg | ORAL_TABLET | ORAL | Status: DC | PRN

## 2019-07-23 MED ORDER — METHOCARBAMOL 1000 MG/10ML IJ SOLN
500.0000 mg | Freq: Four times a day (QID) | INTRAVENOUS | Status: DC | PRN
  Filled 2019-07-23: qty 5

## 2019-07-23 MED ORDER — SODIUM CHLORIDE 0.9 % IR SOLN
Status: DC | PRN
  Administered 2019-07-23: 3000 mL

## 2019-07-23 MED ORDER — MENTHOL 3 MG MT LOZG: 1.0000 | LOZENGE | OROMUCOSAL | Status: DC | PRN

## 2019-07-23 MED ORDER — PROPOFOL 10 MG/ML IV BOLUS
INTRAVENOUS | Status: AC
  Filled 2019-07-23: qty 20

## 2019-07-23 MED ORDER — VANCOMYCIN HCL 1 G IV SOLR
INTRAVENOUS | Status: DC | PRN
  Administered 2019-07-23: 1000 mg

## 2019-07-23 MED ORDER — PROPOFOL 10 MG/ML IV BOLUS
INTRAVENOUS | Status: DC | PRN
  Administered 2019-07-23: 100 mg via INTRAVENOUS

## 2019-07-23 MED ORDER — ASPIRIN EC 325 MG PO TBEC
325.0000 mg | DELAYED_RELEASE_TABLET | Freq: Two times a day (BID) | ORAL | Status: DC
  Administered 2019-07-23 – 2019-07-24 (×2): 325 mg via ORAL
  Filled 2019-07-23 (×2): qty 1

## 2019-07-23 MED ORDER — 0.9 % SODIUM CHLORIDE (POUR BTL) OPTIME
TOPICAL | Status: DC | PRN
  Administered 2019-07-23 (×2): 1000 mL

## 2019-07-23 MED ORDER — LIDOCAINE 2% (20 MG/ML) 5 ML SYRINGE
INTRAMUSCULAR | Status: AC
  Filled 2019-07-23: qty 5

## 2019-07-23 MED ORDER — METHOCARBAMOL 500 MG PO TABS
500.0000 mg | ORAL_TABLET | Freq: Four times a day (QID) | ORAL | Status: DC | PRN
  Administered 2019-07-23: 500 mg via ORAL
  Filled 2019-07-23: qty 1

## 2019-07-23 MED ORDER — FENTANYL CITRATE (PF) 100 MCG/2ML IJ SOLN: 25.0000 ug | INTRAMUSCULAR | Status: DC | PRN

## 2019-07-23 MED ORDER — DEXAMETHASONE SODIUM PHOSPHATE 10 MG/ML IJ SOLN
INTRAMUSCULAR | Status: DC | PRN
  Administered 2019-07-23: 5 mg via INTRAVENOUS

## 2019-07-23 MED ORDER — SORBITOL 70 % SOLN: 30.0000 mL | Freq: Every day | Status: DC | PRN

## 2019-07-23 MED ORDER — OXYCODONE HCL 5 MG/5ML PO SOLN: 5.0000 mg | Freq: Once | ORAL | Status: DC | PRN

## 2019-07-23 MED ORDER — ROCURONIUM BROMIDE 10 MG/ML (PF) SYRINGE
PREFILLED_SYRINGE | INTRAVENOUS | Status: DC | PRN
  Administered 2019-07-23: 40 mg via INTRAVENOUS
  Administered 2019-07-23: 60 mg via INTRAVENOUS
  Administered 2019-07-23 (×2): 20 mg via INTRAVENOUS

## 2019-07-23 MED ORDER — PHENYLEPHRINE HCL-NACL 10-0.9 MG/250ML-% IV SOLN
INTRAVENOUS | Status: DC | PRN
  Administered 2019-07-23: 10 ug/min via INTRAVENOUS

## 2019-07-23 MED ORDER — SUGAMMADEX SODIUM 200 MG/2ML IV SOLN
INTRAVENOUS | Status: DC | PRN
  Administered 2019-07-23: 200 mg via INTRAVENOUS

## 2019-07-23 MED ORDER — CEFAZOLIN SODIUM-DEXTROSE 2-4 GM/100ML-% IV SOLN
2.0000 g | Freq: Four times a day (QID) | INTRAVENOUS | Status: AC
  Administered 2019-07-23 – 2019-07-24 (×3): 2 g via INTRAVENOUS
  Filled 2019-07-23 (×3): qty 100

## 2019-07-23 MED ORDER — ACETAMINOPHEN 325 MG PO TABS: 325.0000 mg | ORAL_TABLET | Freq: Four times a day (QID) | ORAL | Status: DC | PRN

## 2019-07-23 MED ORDER — OXYCODONE HCL 5 MG PO TABS: 5.0000 mg | ORAL_TABLET | Freq: Once | ORAL | Status: DC | PRN

## 2019-07-23 MED ORDER — BUPIVACAINE HCL (PF) 0.25 % IJ SOLN
INTRAMUSCULAR | Status: DC | PRN
  Administered 2019-07-23: 10 mL

## 2019-07-23 MED ORDER — ONDANSETRON HCL 4 MG/2ML IJ SOLN: 4.0000 mg | Freq: Once | INTRAMUSCULAR | Status: DC | PRN

## 2019-07-23 MED ORDER — LIDOCAINE 2% (20 MG/ML) 5 ML SYRINGE
INTRAMUSCULAR | Status: DC | PRN
  Administered 2019-07-23: 80 mg via INTRAVENOUS

## 2019-07-23 MED ORDER — HYDROMORPHONE HCL 1 MG/ML IJ SOLN: 0.5000 mg | INTRAMUSCULAR | Status: DC | PRN

## 2019-07-23 MED ORDER — DEXAMETHASONE SODIUM PHOSPHATE 10 MG/ML IJ SOLN
INTRAMUSCULAR | Status: AC
  Filled 2019-07-23: qty 1

## 2019-07-23 MED ORDER — MIDAZOLAM HCL 5 MG/5ML IJ SOLN
INTRAMUSCULAR | Status: DC | PRN
  Administered 2019-07-23: 2 mg via INTRAVENOUS

## 2019-07-23 MED ORDER — MIDAZOLAM HCL 2 MG/2ML IJ SOLN
INTRAMUSCULAR | Status: AC
  Filled 2019-07-23: qty 2

## 2019-07-23 MED ORDER — OXYCODONE HCL ER 10 MG PO T12A
10.0000 mg | EXTENDED_RELEASE_TABLET | Freq: Two times a day (BID) | ORAL | Status: DC
  Administered 2019-07-23 – 2019-07-29 (×13): 10 mg via ORAL
  Filled 2019-07-23 (×13): qty 1

## 2019-07-23 MED ORDER — FENTANYL CITRATE (PF) 250 MCG/5ML IJ SOLN
INTRAMUSCULAR | Status: AC
  Filled 2019-07-23: qty 5

## 2019-07-23 MED ORDER — ALUM & MAG HYDROXIDE-SIMETH 200-200-20 MG/5ML PO SUSP: 30.0000 mL | ORAL | Status: DC | PRN

## 2019-07-23 MED ORDER — FENTANYL CITRATE (PF) 100 MCG/2ML IJ SOLN
INTRAMUSCULAR | Status: DC | PRN
  Administered 2019-07-23 (×4): 50 ug via INTRAVENOUS

## 2019-07-23 MED ORDER — POLYETHYLENE GLYCOL 3350 17 G PO PACK: 17.0000 g | PACK | Freq: Every day | ORAL | Status: DC | PRN

## 2019-07-23 MED ORDER — SODIUM CHLORIDE 0.9 % IV SOLN
INTRAVENOUS | Status: DC
  Administered 2019-07-23: 15:00:00 via INTRAVENOUS

## 2019-07-23 MED ORDER — OXYCODONE HCL 5 MG PO TABS: 10.0000 mg | ORAL_TABLET | ORAL | Status: DC | PRN

## 2019-07-23 MED ORDER — LACTATED RINGERS IV SOLN
INTRAVENOUS | Status: DC | PRN
  Administered 2019-07-23: 09:00:00 via INTRAVENOUS

## 2019-07-23 MED ORDER — BUPIVACAINE HCL (PF) 0.25 % IJ SOLN
INTRAMUSCULAR | Status: AC
  Filled 2019-07-23: qty 30

## 2019-07-23 MED ORDER — OXYCODONE HCL 5 MG PO TABS: 5.0000 mg | ORAL_TABLET | ORAL | Status: DC | PRN

## 2019-07-23 MED ORDER — MAGNESIUM CITRATE PO SOLN: 1.0000 | Freq: Once | ORAL | Status: DC | PRN

## 2019-07-23 MED ORDER — ACETAMINOPHEN 500 MG PO TABS
1000.0000 mg | ORAL_TABLET | Freq: Four times a day (QID) | ORAL | Status: AC
  Administered 2019-07-23: 1000 mg via ORAL
  Filled 2019-07-23 (×2): qty 2

## 2019-07-23 SURGICAL SUPPLY — 120 items
BAG DECANTER FOR FLEXI CONT (MISCELLANEOUS) ×3 IMPLANT
BIT DRILL 2.2 SS TIBIAL (BIT) ×1 IMPLANT
BLADE CLIPPER SURG (BLADE) IMPLANT
BLADE SURG 10 STRL SS (BLADE) ×3 IMPLANT
BLADE SURG 15 STRL LF DISP TIS (BLADE) ×2 IMPLANT
BLADE SURG 15 STRL SS (BLADE) ×2
BNDG ELASTIC 3X5.8 VLCR STR LF (GAUZE/BANDAGES/DRESSINGS) ×3 IMPLANT
BNDG ELASTIC 4X5.8 VLCR STR LF (GAUZE/BANDAGES/DRESSINGS) ×3 IMPLANT
BNDG ESMARK 4X9 LF (GAUZE/BANDAGES/DRESSINGS) ×4 IMPLANT
CELLS DAT CNTRL 66122 CELL SVR (MISCELLANEOUS) IMPLANT
CORD BIPOLAR FORCEPS 12FT (ELECTRODE) ×3 IMPLANT
COVER PERINEAL POST (MISCELLANEOUS) ×3 IMPLANT
COVER SURGICAL LIGHT HANDLE (MISCELLANEOUS) ×3 IMPLANT
COVER WAND RF STERILE (DRAPES) ×3 IMPLANT
CUFF TOURN SGL QUICK 18X4 (TOURNIQUET CUFF) ×3 IMPLANT
CUFF TOURN SGL QUICK 24 (TOURNIQUET CUFF)
CUFF TRNQT CYL 24X4X16.5-23 (TOURNIQUET CUFF) IMPLANT
CUP ACET PNNCL SECTR W/GRIP 56 (Hips) IMPLANT
DERMABOND ADVANCED (GAUZE/BANDAGES/DRESSINGS) ×1
DERMABOND ADVANCED .7 DNX12 (GAUZE/BANDAGES/DRESSINGS) IMPLANT
DRAPE C-ARM 42X72 X-RAY (DRAPES) ×3 IMPLANT
DRAPE IMP U-DRAPE 54X76 (DRAPES) ×1 IMPLANT
DRAPE POUCH INSTRU U-SHP 10X18 (DRAPES) ×3 IMPLANT
DRAPE STERI IOBAN 125X83 (DRAPES) ×3 IMPLANT
DRAPE U-SHAPE 47X51 STRL (DRAPES) ×6 IMPLANT
DRSG AQUACEL AG ADV 3.5X 6 (GAUZE/BANDAGES/DRESSINGS) ×1 IMPLANT
DRSG AQUACEL AG ADV 3.5X10 (GAUZE/BANDAGES/DRESSINGS) ×3 IMPLANT
DRSG XEROFORM 1X8 (GAUZE/BANDAGES/DRESSINGS) ×1 IMPLANT
DURAPREP 26ML APPLICATOR (WOUND CARE) ×6 IMPLANT
ELECT BLADE 4.0 EZ CLEAN MEGAD (MISCELLANEOUS) ×3
ELECT CAUTERY BLADE 6.4 (BLADE) ×3 IMPLANT
ELECT REM PT RETURN 9FT ADLT (ELECTROSURGICAL) ×3
ELECTRODE BLDE 4.0 EZ CLN MEGD (MISCELLANEOUS) ×2 IMPLANT
ELECTRODE REM PT RTRN 9FT ADLT (ELECTROSURGICAL) ×2 IMPLANT
GAUZE SPONGE 4X4 12PLY STRL (GAUZE/BANDAGES/DRESSINGS) ×3 IMPLANT
GAUZE SPONGE 4X4 12PLY STRL LF (GAUZE/BANDAGES/DRESSINGS) ×1 IMPLANT
GAUZE XEROFORM 1X8 LF (GAUZE/BANDAGES/DRESSINGS) ×3 IMPLANT
GLOVE BIOGEL PI IND STRL 7.0 (GLOVE) ×2 IMPLANT
GLOVE BIOGEL PI INDICATOR 7.0 (GLOVE) ×2
GLOVE ECLIPSE 7.0 STRL STRAW (GLOVE) ×6 IMPLANT
GLOVE SKINSENSE NS SZ7.5 (GLOVE) ×1
GLOVE SKINSENSE STRL SZ7.5 (GLOVE) ×2 IMPLANT
GLOVE SURG SS PI 7.0 STRL IVOR (GLOVE) ×2 IMPLANT
GLOVE SURG SYN 7.5  E (GLOVE) ×3
GLOVE SURG SYN 7.5 E (GLOVE) ×6 IMPLANT
GLOVE SURG SYN 7.5 PF PI (GLOVE) ×8 IMPLANT
GOWN STRL REIN XL XLG (GOWN DISPOSABLE) ×6 IMPLANT
GOWN STRL REUS W/ TWL LRG LVL3 (GOWN DISPOSABLE) IMPLANT
GOWN STRL REUS W/ TWL XL LVL3 (GOWN DISPOSABLE) ×2 IMPLANT
GOWN STRL REUS W/TWL LRG LVL3 (GOWN DISPOSABLE)
GOWN STRL REUS W/TWL XL LVL3 (GOWN DISPOSABLE) ×1
HANDPIECE INTERPULSE COAX TIP (DISPOSABLE) ×1
HEAD CERAMIC 36 PLUS5 (Hips) ×1 IMPLANT
HOOD PEEL AWAY FLYTE STAYCOOL (MISCELLANEOUS) ×7 IMPLANT
IV NS IRRIG 3000ML ARTHROMATIC (IV SOLUTION) ×3 IMPLANT
K-WIRE 1.6 (WIRE) ×3
K-WIRE FX5X1.6XNS BN SS (WIRE) ×6
KIT BASIN OR (CUSTOM PROCEDURE TRAY) ×3 IMPLANT
KIT TURNOVER KIT B (KITS) ×3 IMPLANT
KWIRE FX5X1.6XNS BN SS (WIRE) IMPLANT
MARKER SKIN DUAL TIP RULER LAB (MISCELLANEOUS) ×3 IMPLANT
NDL SPNL 18GX3.5 QUINCKE PK (NEEDLE) ×2 IMPLANT
NEEDLE 22X1 1/2 (OR ONLY) (NEEDLE) IMPLANT
NEEDLE SPNL 18GX3.5 QUINCKE PK (NEEDLE) ×3 IMPLANT
NS IRRIG 1000ML POUR BTL (IV SOLUTION) ×3 IMPLANT
PACK ORTHO EXTREMITY (CUSTOM PROCEDURE TRAY) ×3 IMPLANT
PACK TOTAL JOINT (CUSTOM PROCEDURE TRAY) ×3 IMPLANT
PACK UNIVERSAL I (CUSTOM PROCEDURE TRAY) ×3 IMPLANT
PAD ARMBOARD 7.5X6 YLW CONV (MISCELLANEOUS) ×6 IMPLANT
PAD CAST 4YDX4 CTTN HI CHSV (CAST SUPPLIES) ×2 IMPLANT
PADDING CAST COTTON 4X4 STRL (CAST SUPPLIES) ×1
PINN SECTOR W/GRIP ACE CUP 56 (Hips) ×3 IMPLANT
PINNACLE ALTRX PLUS 4 N 36X56 (Hips) ×1 IMPLANT
PLATE WIDE DVR LEFT (Plate) ×1 IMPLANT
RETRACTOR WND ALEXIS 18 MED (MISCELLANEOUS) IMPLANT
RTRCTR WOUND ALEXIS 18CM MED (MISCELLANEOUS)
SAW OSC TIP CART 19.5X105X1.3 (SAW) ×3 IMPLANT
SCREW 6.5MMX25MM (Screw) ×1 IMPLANT
SCREW 6.5MMX35MM (Screw) IMPLANT
SCREW LOCK 16X2.7X 3 LD TPR (Screw) IMPLANT
SCREW LOCK 18X2.7X 3 LD TPR (Screw) IMPLANT
SCREW LOCK 20X2.7X 3 LD TPR (Screw) IMPLANT
SCREW LOCK 22X2.7X 3 LD TPR (Screw) IMPLANT
SCREW LOCK 24X2.7X3 LD THRD (Screw) IMPLANT
SCREW LOCKING 2.7X16 (Screw) ×3 IMPLANT
SCREW LOCKING 2.7X18 (Screw) ×2 IMPLANT
SCREW LOCKING 2.7X20MM (Screw) ×2 IMPLANT
SCREW LOCKING 2.7X22MM (Screw) ×1 IMPLANT
SCREW LOCKING 2.7X24MM (Screw) ×1 IMPLANT
SCREW NLOCK 26X2.7X3 LD THRD (Screw) IMPLANT
SCREW NONLOCK 2.7X26MM (Screw) ×2 IMPLANT
SET HNDPC FAN SPRY TIP SCT (DISPOSABLE) ×2 IMPLANT
SLING ARM FOAM STRAP LRG (SOFTGOODS) ×1 IMPLANT
SPLINT FIBERGLASS 3X35 (CAST SUPPLIES) ×1 IMPLANT
SPONGE LAP 4X18 RFD (DISPOSABLE) IMPLANT
STAPLER VISISTAT 35W (STAPLE) IMPLANT
STEM CORAIL KA15 (Stem) ×1 IMPLANT
SUT ETHIBOND 2 V 37 (SUTURE) ×4 IMPLANT
SUT ETHILON 2 0 FS 18 (SUTURE) ×3 IMPLANT
SUT ETHILON 4 0 PS 2 18 (SUTURE) ×5 IMPLANT
SUT MNCRL AB 4-0 PS2 18 (SUTURE) IMPLANT
SUT PROLENE 3 0 PS 2 (SUTURE) IMPLANT
SUT VIC AB 0 CT1 27 (SUTURE) ×2
SUT VIC AB 0 CT1 27XBRD ANBCTR (SUTURE) ×2 IMPLANT
SUT VIC AB 1 CTX 36 (SUTURE) ×2
SUT VIC AB 1 CTX36XBRD ANBCTR (SUTURE) ×2 IMPLANT
SUT VIC AB 2-0 CT1 (SUTURE) ×1 IMPLANT
SUT VIC AB 2-0 CT1 27 (SUTURE) ×2
SUT VIC AB 2-0 CT1 TAPERPNT 27 (SUTURE) ×4 IMPLANT
SUT VIC AB 3-0 CT1 27 (SUTURE) ×3
SUT VIC AB 3-0 CT1 TAPERPNT 27 (SUTURE) IMPLANT
SUT VIC AB 3-0 FS2 27 (SUTURE) IMPLANT
SYR 50ML LL SCALE MARK (SYRINGE) ×3 IMPLANT
SYR CONTROL 10ML LL (SYRINGE) IMPLANT
TOWEL GREEN STERILE (TOWEL DISPOSABLE) ×3 IMPLANT
TOWEL GREEN STERILE FF (TOWEL DISPOSABLE) ×3 IMPLANT
TRAY CATH 16FR W/PLASTIC CATH (SET/KITS/TRAYS/PACK) IMPLANT
TUBE CONNECTING 12X1/4 (SUCTIONS) ×3 IMPLANT
UNDERPAD 30X30 (UNDERPADS AND DIAPERS) ×3 IMPLANT
YANKAUER SUCT BULB TIP NO VENT (SUCTIONS) ×3 IMPLANT

## 2019-07-23 NOTE — Plan of Care (Signed)
  Problem: Education: Goal: Knowledge of General Education information will improve Description: Including pain rating scale, medication(s)/side effects and non-pharmacologic comfort measures Outcome: Progressing   Problem: Clinical Measurements: Goal: Respiratory complications will improve Outcome: Progressing Note: On room air   Problem: Nutrition: Goal: Adequate nutrition will be maintained Outcome: Progressing   Problem: Coping: Goal: Level of anxiety will decrease Outcome: Progressing   Problem: Elimination: Goal: Will not experience complications related to urinary retention Outcome: Progressing   Problem: Pain Managment: Goal: General experience of comfort will improve Outcome: Progressing Note: No pain after surgery

## 2019-07-23 NOTE — Op Note (Signed)
TOTAL HIP ARTHROPLASTY ANTERIOR APPROACH, OPEN REDUCTION INTERNAL FIXATION (ORIF) WRIST FRACTURE  Procedure Note Joel Chevere Eno   [id number]  Pre-op Diagnosis: Left femoral neck and distal radius fracture     Post-op Diagnosis: same   Operative Procedures  1. Total hip replacement; Left hip; uncemented [id number]   Personnel  Surgeon(s): Leandrew Koyanagi, MD  Assist: Madalyn Rob, PA-C; necessary for the timely completion of procedure and due to complexity of procedure.   Anesthesia: general  Prosthesis: Depuy Acetabulum: Pinnacle 56 mm Femur: Corail KA 15 Head: 36 mm size: +5 Liner: +4 neutral Bearing Type: ceramic on poly  Total Hip Arthroplasty (Anterior Approach) Op Note:  After informed consent was obtained and the operative extremity marked in the holding area, the patient was brought back to the operating room and placed supine on the HANA table. Next, the operative extremity was prepped and draped in normal sterile fashion. Surgical timeout occurred verifying patient identification, surgical site, surgical procedure and administration of antibiotics.  A modified anterior Smith-Peterson approach to the hip was performed, using the interval between tensor fascia lata and sartorius.  Dissection was carried bluntly down onto the anterior hip capsule. The lateral femoral circumflex vessels were identified and coagulated. A capsulotomy was performed and the capsular flaps tagged for later repair.  The neck osteotomy was performed just distal to the fracture. The femoral head was removed, the acetabular rim was cleared of soft tissue and attention was turned to reaming the acetabulum.  Sequential reaming was performed under fluoroscopic guidance. We reamed to a size 55 mm, and then impacted the acetabular shell.  A 25 mm cancellous screw was placed for added fixation.  The +4 liner was then placed after irrigation and attention turned to the femur.  After placing the femoral  hook, the leg was taken to externally rotated, extended and adducted position taking care to perform soft tissue releases to allow for adequate mobilization of the femur. Soft tissue was cleared from the shoulder of the greater trochanter and the hook elevator used to improve exposure of the proximal femur. Sequential broaching performed up to a size 15. Trial neck and head were placed. The leg was brought back up to neutral and the construct reduced. The position and sizing of components, offset and leg lengths were checked using fluoroscopy. Stability of the construct was checked in extension and external rotation without any subluxation or impingement of prosthesis. We dislocated the prosthesis, dropped the leg back into position, removed trial components, and irrigated copiously. The final stem and head was then placed, the leg brought back up, the system reduced and fluoroscopy used to verify positioning.  We irrigated, obtained hemostasis and closed the capsule using #2 ethibond suture.  One gram of vancomycin powder was placed in the surgical bed. The fascia was closed with #1 vicryl plus, the deep fat layer was closed with 0 vicryl, the subcutaneous layers closed with 2.0 Vicryl Plus and the skin closed with 2.0 nylon and dermabond. A sterile dressing was applied.  All sponge, needle, and instrument counts were correct at the end of the case.  Please refer to the separate operative note for the distal radius fracture.    Position: supine  Complications: see description of procedure.  Time Out: performed   Drains/Packing: none  Estimated blood loss: see anesthesia record  Returned to Recovery Room: in good condition.   Antibiotics: yes   Mechanical VTE (DVT) Prophylaxis: sequential compression devices, TED thigh-high  Chemical VTE (  DVT) Prophylaxis: aspirin   Fluid Replacement: see anesthesia record  Specimens Removed: 1 to pathology   Sponge and Instrument Count Correct? yes   PACU:  portable radiograph - low AP   Plan/RTC: Return in 2 weeks for staple removal. Weight Bearing/Load Lower Extremity: full  Hip precautions: none Suture Removal: 2 weeks   N. Eduard Roux, MD [facility] 11:09 AM   Implant Name Type Inv. Item Serial No. Manufacturer Lot No. LRB No. Used Action  LOG [id number] -  BIOMET DVR HAND INNOVATIONS SET - 1          LOG [id number] -  BIOMET/DEPUY ACE LOCKING SMALL FRAGMENT SET - 1          PINN SECTOR W/GRIP ACE CUP 56 [id number] Hips PINN SECTOR W/GRIP ACE CUP 56  DEPUY SYNTHES [id number] Left 1 Implanted  PINNACLE ALTRX PLUS 4 N 36X56 - [id number] Hips PINNACLE ALTRX PLUS 4 N 36X56  DEPUY SYNTHES [id number] Left 1 Implanted  SCREW 6.5MMX35MM - [id number] Screw SCREW 6.5MMX35MM  DEPUY SYNTHES [id number] Left 1 Wasted  SCREW 6.5MMX25MM - [id number] Screw SCREW 6.5MMX25MM  DEPUY SYNTHES [id number] Left 1 Implanted  HEAD CERAMIC 36 PLUS5 - [id number] Hips HEAD CERAMIC 36 PLUS5  DEPUY SYNTHES [id number] Left 1 Implanted  STEM CORAIL KA15 - [id number] Stem STEM CORAIL KA15  DEPUY SYNTHES [id number] Left 1 Implanted

## 2019-07-23 NOTE — Progress Notes (Signed)
CSW acknowledges consult for SNF placement. Awaiting PT/OT recs and will proceed as needed.

## 2019-07-23 NOTE — Discharge Instructions (Signed)

## 2019-07-23 NOTE — Progress Notes (Signed)
Orthopedic Tech Progress Note Patient Details:  [name] [birth year] [id number] Someone applied a sugartong splint to patient on the 16th at 2220 hours. I'm just clicking it off of my to do list. Patient ID: [name], male   DOB: [birth year], 53 y.o.   MRN: [id number]   Janit Pagan 07/23/2019, 12:43 PM

## 2019-07-23 NOTE — H&P (Signed)

## 2019-07-23 NOTE — Anesthesia Postprocedure Evaluation (Signed)
Anesthesia Post Note  Patient: Willbert Moliere Schurman  Procedure(s) Performed: TOTAL HIP ARTHROPLASTY ANTERIOR APPROACH (Left Hip) OPEN REDUCTION INTERNAL FIXATION (ORIF) WRIST FRACTURE (Left Wrist)     Patient location during evaluation: PACU Anesthesia Type: General Level of consciousness: awake and alert Pain management: pain level controlled Vital Signs Assessment: post-procedure vital signs reviewed and stable Respiratory status: spontaneous breathing, nonlabored ventilation, respiratory function stable and patient connected to nasal cannula oxygen Cardiovascular status: blood pressure returned to baseline and stable Postop Assessment: no apparent nausea or vomiting Anesthetic complications: no    Last Vitals:  Vitals:   07/23/19 1435 07/23/19 1645  BP: 121/66 127/62  Pulse: 91 79  Resp: 16 20  Temp: 36.6 C 36.5 C  SpO2: 94% 96%    Last Pain:  Vitals:   07/23/19 1654  TempSrc:   PainSc: 0-No pain   Pain Goal: Patients Stated Pain Goal: 1 (07/22/19 2259)                 Elanora Quin

## 2019-07-23 NOTE — Progress Notes (Signed)
Pt is stable, with no acute issues, pt signed the blood refusal form although he wasn't sure is he would like to receive Albumen during hospital stay in case he need it.. he is NPO, Seizure precaution set up in patient room. Will continue to monitor.

## 2019-07-23 NOTE — Op Note (Signed)
DATE OF SURGERY: 07/23/2019  PREOPERATIVE DIAGNOSIS: left distal radius fracture  POSTOPERATIVE DIAGNOSIS: same  PROCEDURE: Open reduction and internal fixation, Left CPT [phone] extraarticular  IMPLANT:  Implant Name Type Inv. Item Serial No. Manufacturer Lot No. LRB No. Used Action  LOG [id number] -  BIOMET DVR HAND INNOVATIONS SET - 1          LOG [id number] -  BIOMET/DEPUY ACE LOCKING SMALL FRAGMENT SET - 1          PINN SECTOR W/GRIP ACE CUP 56 - [id number] Hips PINN SECTOR W/GRIP ACE CUP 56  DEPUY SYNTHES [id number] Left 1 Implanted  PINNACLE ALTRX PLUS 4 N 36X56 - [id number] Hips PINNACLE ALTRX PLUS 4 N 36X56  DEPUY SYNTHES [id number] Left 1 Implanted  SCREW 6.5MMX35MM - [id number] Screw SCREW 6.5MMX35MM  DEPUY SYNTHES [id number] Left 1 Wasted  SCREW 6.5MMX25MM - [id number] Screw SCREW 6.5MMX25MM  DEPUY SYNTHES [id number] Left 1 Implanted  HEAD CERAMIC 36 PLUS5 - [id number] Hips HEAD CERAMIC 36 PLUS5  DEPUY SYNTHES [id number] Left 1 Implanted  STEM CORAIL KA15 - [id number] Stem STEM CORAIL KA15  DEPUY SYNTHES [id number] Left 1 Implanted  PLATE WIDE DVR LEFT - [id number] Plate PLATE WIDE DVR LEFT  ZIMMER RECON(ORTH,TRAU,BIO,SG)  Left 1 Implanted  SCREW LOCKING 2.7X16 - [id number] Screw SCREW LOCKING 2.7X16  ZIMMER RECON(ORTH,TRAU,BIO,SG)  Left 3 Implanted  SCREW LOCKING 2.7X18 - [id number] Screw SCREW LOCKING 2.7X18  ZIMMER RECON(ORTH,TRAU,BIO,SG)  Left 2 Implanted  SCREW LOCKING 2.7X24MM - [id number] Screw SCREW LOCKING 2.7X24MM  ZIMMER RECON(ORTH,TRAU,BIO,SG)  Left 1 Implanted  SCREW LOCKING 2.7X22MM - [id number] Screw SCREW LOCKING 2.7X22MM  ZIMMER RECON(ORTH,TRAU,BIO,SG)  Left 1 Implanted  SCREW LOCKING 2.7X20MM - [id number] Screw SCREW LOCKING 2.7X20MM  ZIMMER RECON(ORTH,TRAU,BIO,SG)  Left 2 Implanted  SCREW NONLOCK 2.7X26MM - [id number] Screw SCREW NONLOCK 2.7X26MM  ZIMMER RECON(ORTH,TRAU,BIO,SG)  Left 2 Implanted     SURGEON: Surgeon(s) and Role:    Leandrew Koyanagi, MD - Primary  ASSIST: Madalyn Rob, PA-C; necessary for the timely completion of procedure and due to complexity of procedure.  ANESTHESIA: General  TOURNIQUET TIME: less than 30 mins  BLOOD LOSS: Minimal.  COMPLICATIONS: see description of procedure.  TIME OUT: Performed before the start of procedure.  INDICATIONS: The patient is a 53 y.o. male who presented with a displaced left distal radius fracture.  DESCRIPTION OF PROCEDURE:  After completion of the hip surgery, we turned our attention to the left distal radius fracture.  A well padded nonsterile tourniquet was placed. The operative extremity was prepped and draped in standard sterile fashion.  A timeout was performed.  Preoperative antibiotics were given at appropriate intervals.  The extremity was exsanguinated, tourniquet inflated to 250 mmHg.  A FCR approach was used.  Dissection through the sheath of FCR was carried out and the FCR tendon was retracted. The floor of FCR sheath was released. Flexor tendons and median nerve were retracted ulnarly and protected. Pronator quadratus was released from distal radius. The metaphyseal fracture line was visualized. Fracture was reduced under fluoroscopy and K wire was placed for provisional fixation. A volar locking plate was applied to the volar surface of the distal radius. Locking distal locking screws and nonlocking shaft screws were inserted. Fluoroscopy was used to show adequate placement of hardware. The tourniquet was deflated. Hemostasis was achieved. The wound was irrigated. Incision closed in layers. Sterile dressing applied. Wrist immobilized in a volar slab splint. The patient was transferred to the recovery room in stable condition  after all counts were correct.  POSTOPERATIVE PLAN: platform weight bearing to left arm.

## 2019-07-23 NOTE — Transfer of Care (Signed)
Immediate Anesthesia Transfer of Care Note  Patient: Quindon Geckler Humphries  Procedure(s) Performed: TOTAL HIP ARTHROPLASTY ANTERIOR APPROACH (Left Hip) OPEN REDUCTION INTERNAL FIXATION (ORIF) WRIST FRACTURE (Left Wrist)  Patient Location: PACU  Anesthesia Type:General  Level of Consciousness: awake, alert  and oriented  Airway & Oxygen Therapy: Patient Spontanous Breathing and Patient connected to face mask oxygen  Post-op Assessment: Report given to RN and Post -op Vital signs reviewed and stable  Post vital signs: Reviewed and stable  Last Vitals:  Vitals Value Taken Time  BP 129/65 07/23/19 1304  Temp    Pulse 90 07/23/19 1313  Resp 17 07/23/19 1313  SpO2 100 % 07/23/19 1313  Vitals shown include unvalidated device data.  Last Pain:  Vitals:   07/23/19 0249  TempSrc: Oral  PainSc:       Patients Stated Pain Goal: 1 ([id number])  Complications: No apparent anesthesia complications

## 2019-07-23 NOTE — Plan of Care (Signed)

## 2019-07-23 NOTE — Progress Notes (Signed)
PROGRESS NOTE  [name]  DOB: 01-30-1966  PCP: Biagio Borg, MD [id number]  DOA: 07/21/2019  LOS: 1 day   Chief Complaint  Patient presents with  . Seizures  . Injuries from seizure   Brief narrative: Patient is a 53 y.o. 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 He presented to the ED on 11/16 with seizure leading to fall and fractures.  Apparently, patient had not taken her Keppra lately.  She underwent breakthrough seizures leading to fall.  She was found in a postictal state and complain of pain in left wrist and left hip area. Work-up in the ED showed comminuted and dorsally impacted distal left radius fracture, mildly comminuted and displaced ulnar styloid fracture and also a mildly impacted left femoral neck fracture.  Subjective: Patient was seen and examined this morning prior to surgery. Not in distress.  No new complaints.  Pain controlled.  Assessment/Plan: Closed left hip fracture  -Status post left total hip replacement. -Pain control and DVT prophylaxis per orthopedics team.  Left radial and ulnar fracture -S/p ORIF.  Bipolar affective disorder  - chronic currently stable  Wilson disease -noting chronic liver disease followed by [facility] currently stable  History of alcohol abuse -currently in remission no evidence of alcohol withdrawal  Rib fracture -conservative measures control incentive spirometry  Thrombocytopenia -platelet level 56 today. Patient declines getting blood product because of religious belief.  History of seizure disorder with recent seizure secondary to noncompliance with Keppra. Currently on Keppra IV.Other plan as per orders.  Mobility: PT eval DVT prophylaxis:  SCDs Code Status:   Code Status: Full Code  Family Communication: none at bedside Expected Discharge:  Home versus rehab, based on PT  eval  Consultants:  Orthopedics  Procedures:    Antimicrobials: Anti-infectives (From admission, onward)   Start     Dose/Rate Route Frequency Ordered Stop   07/23/19 1530  ceFAZolin (ANCEF) IVPB 2g/100 mL premix     2 g 200 mL/hr over 30 Minutes Intravenous Every 6 hours 07/23/19 1434 07/24/19 0929   07/23/19 1105  vancomycin (VANCOCIN) powder  Status:  Discontinued       As needed 07/23/19 1105 07/23/19 1300   07/23/19 0600  ceFAZolin (ANCEF) IVPB 2g/100 mL premix     2 g 200 mL/hr over 30 Minutes Intravenous On call to O.R. 07/22/19 1342 07/23/19 0958   07/22/19 1000  rifaximin (XIFAXAN) tablet 550 mg     550 mg Oral 3 times daily 07/22/19 0400        Diet Order            Diet regular Room service appropriate? Yes; Fluid consistency: Thin  Diet effective now              Infusions:  . sodium chloride Stopped (07/22/19 0240)  . sodium chloride Stopped (07/23/19 1447)  .  ceFAZolin (ANCEF) IV 200 mL/hr at 07/23/19 1500  . levETIRAcetam Stopped (07/23/19 0554)  . methocarbamol (ROBAXIN) IV      Scheduled Meds: . acetaminophen  1,000 mg Oral Q6H  . aspirin EC  325 mg Oral BID  . docusate sodium  100 mg Oral BID  . feeding supplement (ENSURE ENLIVE)  237 mL Oral BID BM  . folic acid  1 mg Oral Daily  . furosemide  80 mg Oral BID  . lactulose  10 g Oral TID  . multivitamin with minerals  1 tablet Oral Daily  . oxyCODONE  10 mg Oral Q12H  . rifaximin  550 mg Oral TID  . spironolactone  200 mg Oral q morning - 10a    PRN meds: [START ON 07/24/2019] acetaminophen, alum & mag hydroxide-simeth, bisacodyl, HYDROcodone-acetaminophen, HYDROmorphone (DILAUDID) injection, LORazepam, magnesium citrate, menthol-cetylpyridinium **OR** phenol, methocarbamol **OR** methocarbamol (ROBAXIN) IV, morphine injection, ondansetron **OR** ondansetron (ZOFRAN) IV, oxyCODONE, oxyCODONE, polyethylene glycol, sodium phosphate, sorbitol   Objective: Vitals:   07/23/19 1435 07/23/19  1645  BP: 121/66 127/62  Pulse: 91 79  Resp: 16 20  Temp: 97.9 F (36.6 C) 97.7 F (36.5 C)  SpO2: 94% 96%    Intake/Output Summary (Last 24 hours) at 07/23/2019 1656 Last data filed at 07/23/2019 1500 Gross per 24 hour  Intake 1421.1 ml  Output 800 ml  Net 621.1 ml   Filed Weights   07/21/19 1920  Weight: 90.7 kg   Weight change:  Body mass index is 26.39 kg/m.   Physical Exam: General exam: Appears calm and comfortable.  Skin: No rashes, lesions or ulcers. HEENT: Atraumatic, normocephalic, supple neck, no obvious bleeding Lungs: Clear to auscultation bilaterally CVS: Regular rate and rhythm, no murmur GI/Abd soft, nontender, nondistended, bowel sound present CNS: Alert, awake, oriented x3 Psychiatry: Mood appropriate Extremities: No pedal edema, no calf tenderness  Data Review: I have personally reviewed the laboratory data and studies available.  Recent Labs  Lab 07/21/19 2213 07/22/19 0838 07/23/19 0246  WBC 5.0 5.3 7.1  NEUTROABS  --   --  5.4  HGB 10.6* 12.6* 11.9*  HCT 31.7* 37.9* 35.0*  MCV 101.3* 103.0* 98.6  PLT 53* 54* 56*   Recent Labs  Lab 07/21/19 2213 07/22/19 0838 07/23/19 0246  NA 134* 130* 135  K 3.4* 4.7 4.1  CL 109 102 104  CO2 20* 23 25  GLUCOSE 92 90 104*  BUN 16 15 15   CREATININE 0.55* 0.65 0.72  CALCIUM 7.5* 8.7* 9.1    [name], MD  [facility] 07/23/2019

## 2019-07-23 NOTE — Anesthesia Procedure Notes (Signed)
Procedure Name: Intubation Date/Time: 07/23/2019 9:45 AM Performed by: Kyung Rudd, CRNA Pre-anesthesia Checklist: Patient identified, Emergency Drugs available, Suction available and Patient being monitored Patient Re-evaluated:Patient Re-evaluated prior to induction Oxygen Delivery Method: Circle system utilized Preoxygenation: Pre-oxygenation with 100% oxygen Induction Type: IV induction Ventilation: Mask ventilation without difficulty Laryngoscope Size: Mac and 4 Grade View: Grade II Tube type: Oral Tube size: 7.5 mm Number of attempts: 1 Airway Equipment and Method: Stylet Placement Confirmation: ETT inserted through vocal cords under direct vision,  positive ETCO2 and breath sounds checked- equal and bilateral Secured at: 22 cm Tube secured with: Tape Dental Injury: Teeth and Oropharynx as per pre-operative assessment

## 2019-07-24 ENCOUNTER — Encounter (HOSPITAL_COMMUNITY): Payer: Self-pay | Admitting: Orthopaedic Surgery

## 2019-07-24 LAB — CBC
HCT: 32.3 % — ABNORMAL LOW (ref 39.0–52.0)
Hemoglobin: 10.8 g/dL — ABNORMAL LOW (ref 13.0–17.0)
MCH: 33.5 pg (ref 26.0–34.0)
MCHC: 33.4 g/dL (ref 30.0–36.0)
MCV: 100.3 fL — ABNORMAL HIGH (ref 80.0–100.0)
Platelets: 58 10*3/uL — ABNORMAL LOW (ref 150–400)
RBC: 3.22 MIL/uL — ABNORMAL LOW (ref 4.22–5.81)
RDW: 15.1 % (ref 11.5–15.5)
WBC: 10.6 10*3/uL — ABNORMAL HIGH (ref 4.0–10.5)
nRBC: 0 % (ref 0.0–0.2)

## 2019-07-24 LAB — BASIC METABOLIC PANEL
Anion gap: 7 (ref 5–15)
BUN: 15 mg/dL (ref 6–20)
CO2: 23 mmol/L (ref 22–32)
Calcium: 8.6 mg/dL — ABNORMAL LOW (ref 8.9–10.3)
Chloride: 100 mmol/L (ref 98–111)
Creatinine, Ser: 0.89 mg/dL (ref 0.61–1.24)
GFR calc Af Amer: >60 mL/min (ref >60)
GFR calc non Af Amer: >60 mL/min (ref >60)
Glucose, Bld: 148 mg/dL — ABNORMAL HIGH (ref 70–99)
Potassium: 4.2 mmol/L (ref 3.5–5.1)
Sodium: 130 mmol/L — ABNORMAL LOW (ref 135–145)

## 2019-07-24 MED ORDER — ASPIRIN 325 MG PO TBEC: DELAYED_RELEASE_TABLET | ORAL | 0 refills | Status: DC

## 2019-07-24 MED ORDER — LEVETIRACETAM 500 MG PO TABS
1500.0000 mg | ORAL_TABLET | Freq: Two times a day (BID) | ORAL | Status: DC
  Administered 2019-07-24 – 2019-07-29 (×11): 1500 mg via ORAL
  Filled 2019-07-24 (×11): qty 3

## 2019-07-24 MED ORDER — OXYCODONE HCL 5 MG PO TABS: 5.0000 mg | ORAL_TABLET | Freq: Four times a day (QID) | ORAL | 0 refills | Status: DC | PRN

## 2019-07-24 MED ORDER — ASPIRIN EC 81 MG PO TBEC
81.0000 mg | DELAYED_RELEASE_TABLET | Freq: Two times a day (BID) | ORAL | Status: DC
  Administered 2019-07-25 – 2019-07-29 (×9): 81 mg via ORAL
  Filled 2019-07-24 (×10): qty 1

## 2019-07-24 NOTE — Progress Notes (Signed)
Rehab Admissions Coordinator Note:  Per PT and OT recommendation, this patient was screened by Raechel Ache for appropriateness for an Inpatient Acute Rehab Consult.  At this time, we are recommending Inpatient Rehab consult.  I will place a rehab consult order in the chart. An AC will follow up for further assessment to determine pt's candidacy for a possible IP Rehab admission.   Raechel Ache 07/24/2019, 2:26 PM  I can be reached at [phone].

## 2019-07-24 NOTE — Evaluation (Signed)
Occupational Therapy Evaluation Patient Details Name: [name] MRN: [id number] DOB: Mar 06, 1966 Today's Date: 07/24/2019    History of Present Illness Patient is a 53 y.o. male with medical history significant of alcoholic liver disease complicated by [place] disease, seizure disorder, bipolar, depression, dyslipidemiaVentral hernia. He presented to the ED on 11/16 with seizure leading to fall and L hip and wrist fractures. Pt s/p L total L hip athroplasty anterior approach and L wrist ORIF   Clinical Impression   Pt with decline in function and safety with ADLs and ADL mobility with impaired strength, balance, endurance and L UE ROM/function (WB through elbow only). Pt reports that PTA, he lived with a friend independent with ADLs/selfcare and mobility. Pt with impaired ROM of R digits due to brachial plexus injury, however dis not impede independence. Pt currently requires mod A +2 for bed mobility, mod A with UB ADLs, max - total A with LB ADLs, total A with toileting and mod - min A +2 for mobility using PFRW. Pt would benefit from acute OT services to address impairments to maximize level of function and safety    Follow Up Recommendations  CIR    Equipment Recommendations  3 in 1 bedside commode;Other (comment)(reacher, TBD at next venue of care)    Recommendations for Other Services Rehab consult     Precautions / Restrictions Precautions Precautions: Fall Required Braces or Orthoses: Splint/Cast;Sling Splint/Cast: L wrist Restrictions Weight Bearing Restrictions: Yes LUE Weight Bearing: Weight bear through elbow only LLE Weight Bearing: Weight bearing as tolerated      Mobility Bed Mobility Overal bed mobility: Needs Assistance Bed Mobility: Supine to Sit     Supine to sit: +2 for physical assistance;Mod assist;HOB elevated     General bed mobility comments: +rail, increased time due to pain, cues for sequencing  Transfers Overall transfer level: Needs  assistance Equipment used: Left platform walker Transfers: Sit to/from Stand Sit to Stand: +2 physical assistance;Mod assist         General transfer comment: cues for sequencing and hand placement. Assist to power up.    Balance Overall balance assessment: Needs assistance Sitting-balance support: No upper extremity supported;Feet supported Sitting balance-Leahy Scale: Fair     Standing balance support: Bilateral upper extremity supported;During functional activity Standing balance-Leahy Scale: Poor Standing balance comment: reliant on external support                           ADL either performed or assessed with clinical judgement   ADL Overall ADL's : Needs assistance/impaired Eating/Feeding: Set up;Sitting   Grooming: Wash/dry hands;Wash/dry face;Set up;Sitting   Upper Body Bathing: Moderate assistance;Sitting   Lower Body Bathing: Maximal assistance;Sitting/lateral leans   Upper Body Dressing : Moderate assistance;Sitting   Lower Body Dressing: Total assistance;Sitting/lateral leans   Toilet Transfer: Moderate assistance;+2 for physical assistance;Cueing for safety;Cueing for sequencing;RW;Ambulation;Stand-pivot Toilet Transfer Details (indicate cue type and reason): simulated to recliner Toileting- Clothing Manipulation and Hygiene: Total assistance;Sit to/from stand       Functional mobility during ADLs: Moderate assistance;+2 for physical assistance;Cueing for sequencing;Cueing for safety;Rolling walker General ADL Comments: pt educated on sling wear and proper psotioning of L UE for edema mgt     Vision Patient Visual Report: No change from baseline       Perception     Praxis      Pertinent Vitals/Pain Pain Assessment: 0-10 Pain Score: 7  Faces Pain Scale: Hurts little more Pain  Location: L LE Pain Descriptors / Indicators: Grimacing;Guarding;Aching;Sore Pain Intervention(s): Limited activity within patient's tolerance;Monitored  during session;Repositioned;Patient requesting pain meds-RN notified     Hand Dominance Right   Extremity/Trunk Assessment Upper Extremity Assessment Upper Extremity Assessment: Defer to OT evaluation LUE Deficits / Details: forearm cast/splint LUE: Unable to fully assess due to immobilization   Lower Extremity Assessment Lower Extremity Assessment: LLE deficits/detail LLE Deficits / Details: fall with hip fx, s/p THA direct anterior approach       Communication Communication Communication: No difficulties   Cognition Arousal/Alertness: Awake/alert Behavior During Therapy: WFL for tasks assessed/performed Overall Cognitive Status: Within Functional Limits for tasks assessed                                     General Comments       Exercises Other Exercises Other Exercises: pt instructed on ROM exercises of L fingers, elbow and shoulder   Shoulder Instructions      Home Living Family/patient expects to be discharged to:: Private residence Living Arrangements: Non-relatives/Friends Available Help at Discharge: Friend(s);Available PRN/intermittently Type of Home: House Home Access: Stairs to enter [employer] of Steps: 3   Home Layout: One level     Bathroom Shower/Tub: Teacher, early years/pre: Standard Bathroom Accessibility: Yes   Home Equipment: Tub bench;Bedside commode;Cane - single point          Prior Functioning/Environment Level of Independence: Independent with assistive device(s)    ADL's / Homemaking Assistance Needed: independent with ADLs/selfcare   Comments: Pt fell in Jan 2020 sustaining bilat brachial plexus injuries. Transitioned from hospital to SNF to ALF and finally home in August 2020. Pt living with a friend but functioning independently.        OT Problem List: Decreased strength;Impaired balance (sitting and/or standing);Pain;Decreased activity tolerance;Decreased knowledge of use of DME or  AE;Impaired UE functional use      OT Treatment/Interventions: Self-care/ADL training;DME and/or AE instruction;Therapeutic activities;Balance training;Therapeutic exercise;Patient/family education    OT Goals(Current goals can be found in the care plan section) Acute Rehab OT Goals Patient Stated Goal: independence OT Goal Formulation: With patient Time For Goal Achievement: 08/07/19 Potential to Achieve Goals: Good ADL Goals Pt Will Perform Grooming: with supervision;with set-up;sitting Pt Will Perform Upper Body Bathing: with min assist Pt Will Perform Lower Body Bathing: with mod assist;sitting/lateral leans Pt Will Perform Upper Body Dressing: with min assist;sitting Pt Will Transfer to Toilet: with mod assist;with min assist;ambulating Additional ADL Goal #1: pt will tolerate ROM of L elbow and shoulder with gentle ROM of L digits Additional ADL Goal #2: pt will verbalize and demo proper wear/positioing of sling and L UE for edema mgt  OT Frequency: Min 2X/week   Barriers to D/C:            Co-evaluation PT/OT/SLP Co-Evaluation/Treatment: Yes Reason for Co-Treatment: Complexity of the patient's impairments (multi-system involvement);For patient/therapist safety;To address functional/ADL transfers PT goals addressed during session: Mobility/safety with mobility;Balance;Proper use of DME OT goals addressed during session: ADL's and self-care      AM-PAC OT "6 Clicks" Daily Activity     Outcome Measure Help from another person eating meals?: A Little Help from another person taking care of personal grooming?: A Little Help from another person toileting, which includes using toliet, bedpan, or urinal?: Total Help from another person bathing (including washing, rinsing, drying)?: Total Help from another person to put  on and taking off regular upper body clothing?: A Lot Help from another person to put on and taking off regular lower body clothing?: Total 6 Click Score: 11    End of Session Equipment Utilized During Treatment: Gait belt;Rolling walker;Other (comment)(PFRW)  Activity Tolerance: Patient limited by fatigue Patient left: in chair;with call bell/phone within reach  OT Visit Diagnosis: Unsteadiness on feet (R26.81);Other abnormalities of gait and mobility (R26.89);Muscle weakness (generalized) (M62.81);History of falling (Z91.81);Pain Pain - Right/Left: Left Pain - part of body: Hip;Leg;Ankle and joints of foot                Time: [id number] OT Time Calculation (min): 21 min Charges:  OT General Charges $OT Visit: 1 Visit OT Evaluation $OT Eval Moderate Complexity: 1 Mod    Britt Bottom 07/24/2019, 1:55 PM

## 2019-07-24 NOTE — Progress Notes (Signed)
Subjective: 1 Day Post-Op Procedure(s) (LRB): TOTAL HIP ARTHROPLASTY ANTERIOR APPROACH (Left) OPEN REDUCTION INTERNAL FIXATION (ORIF) WRIST FRACTURE (Left) Patient reports pain as mild.    Objective: Vital signs in last 24 hours: Temp:  [97.4 F (36.3 C)-98.2 F (36.8 C)] 98.2 F (36.8 C) (11/19 0752) Pulse Rate:  [79-97] 81 (11/19 0752) Resp:  [15-20] 19 (11/19 0752) BP: (105-143)/(61-68) 122/61 (11/19 0752) SpO2:  [91 %-100 %] 97 % (11/19 0752)  Intake/Output from previous day: 11/18 0701 - 11/19 0700 In: 853.3 [I.V.:708.8; IV Piggyback:144.5] Out: 1150 [Urine:900; Blood:250] Intake/Output this shift: No intake/output data recorded.  Recent Labs    07/21/19 2213 07/22/19 0838 07/23/19 0246 07/24/19 0356  HGB 10.6* 12.6* 11.9* 10.8*   Recent Labs    07/23/19 0246 07/24/19 0356  WBC 7.1 10.6*  RBC 3.55* 3.22*  HCT 35.0* 32.3*  PLT 56* 58*   Recent Labs    07/23/19 0246 07/24/19 0356  NA 135 130*  K 4.1 4.2  CL 104 100  CO2 25 23  BUN 15 15  CREATININE 0.72 0.89  GLUCOSE 104* 148*  CALCIUM 9.1 8.6*   Recent Labs    07/21/19 2213  INR 1.5*    Neurologically intact Neurovascular intact Sensation intact distally Intact pulses distally Dorsiflexion/Plantar flexion intact Incision: scant drainage No cellulitis present Compartment soft  Splint in place LUE   Assessment/Plan: 1 Day Post-Op Procedure(s) (LRB): TOTAL HIP ARTHROPLASTY ANTERIOR APPROACH (Left) OPEN REDUCTION INTERNAL FIXATION (ORIF) WRIST FRACTURE (Left) Up with therapy  WBAT LLE Platform weight bearing LUE Ice and elevate LUE for pain and swelling ASA 325 mg bid x 6 weeks for dvt ppx F/u with Dr. Erlinda Hong 2 weeks post-op for suture removal      [name] 07/24/2019, 7:56 AM

## 2019-07-24 NOTE — Progress Notes (Signed)
PROGRESS NOTE  [name]  DOB: 29-Jun-1966  PCP: Biagio Borg, MD [id number]  DOA: 07/21/2019  LOS: 2 days   Chief Complaint  Patient presents with  . Seizures  . Injuries from seizure   Brief narrative: Patient is a 53 y.o. 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 He presented to the ED on 11/16 with seizure leading to fall and fractures.  Patient states that he is compliant to [place] at home. He tried CBD oil for pain on 11/15. He thinks that might have led him to have seizures on 11/16. She was found in a postictal state and complained of pain in left wrist and left hip area. Patient denies taking any alcohol since January 2020. Work-up in the ED showed comminuted and dorsally impacted distal left radius fracture, mildly comminuted and displaced ulnar styloid fracture and also a mildly impacted left femoral neck fracture.  Subjective: Patient was seen and examined this morning  Pleasant middle-aged male. Lying down in bed. Not in distress. Has cast in left upper extremity. No fever, blood pressure stable Labs showed BMP normal, WBC count slightly elevated 10.7, hemoglobin 10.8, platelets low at 58  Assessment/Plan: Closed left hip fracture  -Status post left total hip replacement. -Pain control. -Weightbearing as tolerated  Left radial and ulnar fracture -S/p ORIF. -Platform weightbearing per orthopedics.  Ice application and elevate LUE for pain and swelling.  Rib fracture -conservative measures control incentive spirometry  Liver cirrhosis secondary to alcohol use and Wilson's disease -Follows up with hepatologist at [place] meds include Lasix 80 mg twice daily, potassium replacement, Aldactone, Xifaxan. -Continue all. -Also reports history of variceal bleeding. Currently no evidence of bleeding but platelets low. He has been  started on aspirin 325 mg daily by orthopedics. I will discuss with them regarding the risk of bleeding with this.   History of alcohol abuse -currently in remission no evidence of alcohol withdrawal  Bipolar affective disorder - chronic currently stable  Thrombocytopenia -platelet level low but stable, 56 today. Secondary to liver cirrhosis. Patient declines getting blood product because of religious belief.  History of seizure disorder -takes Keppra at home. Reports compliance. Patient states that he tried CBD oil for pain which may have lowered seizure threshold.  Mobility: PT eval DVT prophylaxis:  Currently on aspirin 325 mg twice daily for 6 weeks per orthopedics. Code Status:   Code Status: Full Code  Family Communication: none at bedside Expected Discharge:  Home versus rehab, based on PT eval  Consultants:  Orthopedics  Procedures:    Antimicrobials: Anti-infectives (From admission, onward)   Start     Dose/Rate Route Frequency Ordered Stop   07/23/19 1530  ceFAZolin (ANCEF) IVPB 2g/100 mL premix     2 g 200 mL/hr over 30 Minutes Intravenous Every 6 hours 07/23/19 1434 07/24/19 0305   07/23/19 1105  vancomycin (VANCOCIN) powder  Status:  Discontinued       As needed 07/23/19 1105 07/23/19 1300   07/23/19 0600  ceFAZolin (ANCEF) IVPB 2g/100 mL premix     2 g 200 mL/hr over 30 Minutes Intravenous On call to O.R. 07/22/19 1342 07/23/19 0958   07/22/19 1000  rifaximin (XIFAXAN) tablet 550 mg     550 mg Oral 3 times daily 07/22/19 0400        Diet Order            Diet regular Room service  appropriate? Yes; Fluid consistency: Thin  Diet effective now              Infusions:  . sodium chloride Stopped (07/22/19 0240)  . sodium chloride Stopped (07/23/19 1447)  . levETIRAcetam 1,500 mg (07/23/19 1804)  . methocarbamol (ROBAXIN) IV      Scheduled Meds: . acetaminophen  1,000 mg Oral Q6H  . aspirin EC  325 mg Oral BID  . docusate sodium  100 mg Oral BID   . feeding supplement (ENSURE ENLIVE)  237 mL Oral BID BM  . folic acid  1 mg Oral Daily  . furosemide  80 mg Oral BID  . lactulose  10 g Oral TID  . multivitamin with minerals  1 tablet Oral Daily  . oxyCODONE  10 mg Oral Q12H  . rifaximin  550 mg Oral TID  . spironolactone  200 mg Oral q morning - 10a    PRN meds: acetaminophen, alum & mag hydroxide-simeth, bisacodyl, HYDROcodone-acetaminophen, HYDROmorphone (DILAUDID) injection, LORazepam, magnesium citrate, menthol-cetylpyridinium **OR** phenol, methocarbamol **OR** methocarbamol (ROBAXIN) IV, morphine injection, ondansetron **OR** ondansetron (ZOFRAN) IV, oxyCODONE, oxyCODONE, polyethylene glycol, sodium phosphate, sorbitol   Objective: Vitals:   07/23/19 1645 07/24/19 0752  BP: 127/62 122/61  Pulse: 79 81  Resp: 20 19  Temp: 97.7 F (36.5 C) 98.2 F (36.8 C)  SpO2: 96% 97%    Intake/Output Summary (Last 24 hours) at 07/24/2019 0912 Last data filed at 07/24/2019 0122 Gross per 24 hour  Intake 749.47 ml  Output 850 ml  Net -100.53 ml   Filed Weights   07/21/19 1920  Weight: 90.7 kg   Weight change:  Body mass index is 26.39 kg/m.   Physical Exam: General exam: Appears calm and comfortable.  Skin: No rashes, lesions or ulcers. HEENT: Atraumatic, normocephalic, supple neck, no obvious bleeding Lungs: Clear to auscultation bilaterally CVS: Regular rate and rhythm, no murmur GI/Abd soft, nontender, nondistended, bowel sound present CNS: Alert, awake, oriented x3. No alcohol-related tremors. Psychiatry: Mood appropriate Extremities: No pedal edema, no calf tenderness. Left upper extremity in cast.  Data Review: I have personally reviewed the laboratory data and studies available.  Recent Labs  Lab 07/21/19 2213 07/22/19 0838 07/23/19 0246 07/24/19 0356  WBC 5.0 5.3 7.1 10.6*  NEUTROABS  --   --  5.4  --   HGB 10.6* 12.6* 11.9* 10.8*  HCT 31.7* 37.9* 35.0* 32.3*  MCV 101.3* 103.0* 98.6 100.3*  PLT 53*  54* 56* 58*   Recent Labs  Lab 07/21/19 2213 07/22/19 0838 07/23/19 0246 07/24/19 0356  NA 134* 130* 135 130*  K 3.4* 4.7 4.1 4.2  CL 109 102 104 100  CO2 20* 23 25 23   GLUCOSE 92 90 104* 148*  BUN 16 15 15 15   CREATININE 0.55* 0.65 0.72 0.89  CALCIUM 7.5* 8.7* 9.1 8.6*    [name], MD  [facility] 07/24/2019

## 2019-07-24 NOTE — Evaluation (Signed)
Physical Therapy Evaluation Patient Details Name: [name] MRN: [id number] DOB: September 28, 1965 Today's Date: 07/24/2019   History of Present Illness  Patient is a 53 y.o. male with medical history significant of alcoholic liver disease complicated by [place] Pulling disease, seizure disorder, bipolar, depression, dyslipidemiaVentral hernia. He presented to the ED on 11/16 with seizure leading to fall and L hip and wrist fractures. Pt s/p L total L hip athroplasty anterior approach and L wrist ORIF    Clinical Impression  Pt admitted with above diagnosis. On eval, pt required +2 mod assist bed mobility, +2 mod assist sit to stand, and +2 min assist ambulation 3' with L platform RW. Cues needed throughout session to prevent WB through L wrist. Pt currently with functional limitations due to the deficits listed below (see PT Problem List). Pt will benefit from skilled PT to increase their independence and safety with mobility to allow discharge to the venue listed below.       Follow Up Recommendations CIR    Equipment Recommendations  Rolling walker with 5" wheels;Other (comment);3in1 (PT)(L platform RW)    Recommendations for Other Services Rehab consult     Precautions / Restrictions Precautions Precautions: Fall Required Braces or Orthoses: Splint/Cast;Sling Splint/Cast: L wrist Restrictions Weight Bearing Restrictions: Yes LUE Weight Bearing: Weight bear through elbow only LLE Weight Bearing: Weight bearing as tolerated      Mobility  Bed Mobility Overal bed mobility: Needs Assistance Bed Mobility: Supine to Sit     Supine to sit: +2 for physical assistance;Mod assist;HOB elevated     General bed mobility comments: +rail, increased time due to pain, cues for sequencing  Transfers Overall transfer level: Needs assistance Equipment used: Left platform walker Transfers: Sit to/from Stand Sit to Stand: +2 physical assistance;Mod assist         General transfer comment:  cues for sequencing and hand placement. Assist to power up.  Ambulation/Gait Ambulation/Gait assistance: Min assist;+2 physical assistance Gait Distance (Feet): 3 Feet Assistive device: Left platform walker Gait Pattern/deviations: Step-to pattern Gait velocity: decreased   General Gait Details: cues for sequencing and posture. Distance limited by pain.  Stairs            Wheelchair Mobility    Modified Rankin (Stroke Patients Only)       Balance Overall balance assessment: Needs assistance Sitting-balance support: No upper extremity supported;Feet supported Sitting balance-Leahy Scale: Fair     Standing balance support: Bilateral upper extremity supported;During functional activity Standing balance-Leahy Scale: Poor Standing balance comment: reliant on external support                             Pertinent Vitals/Pain Pain Assessment: 0-10 Pain Score: 7  Faces Pain Scale: Hurts little more Pain Location: L LE Pain Descriptors / Indicators: Grimacing;Guarding;Aching;Sore Pain Intervention(s): Limited activity within patient's tolerance;Monitored during session;Repositioned;Patient requesting pain meds-RN notified    Home Living Family/patient expects to be discharged to:: Private residence Living Arrangements: Non-relatives/Friends Available Help at Discharge: Friend(s);Available PRN/intermittently Type of Home: House Home Access: Stairs to enter   Entrance Stairs-Number of Steps: 3 Home Layout: One level Home Equipment: Tub bench;Bedside commode;Cane - single point      Prior Function Level of Independence: Independent with assistive device(s);Needs assistance      ADL's / Homemaking Assistance Needed: independent with ADLs/selfcare  Comments: Pt fell in Jan 2020 sustaining bilat brachial plexus injuries. Transitioned from hospital to SNF to ALF and finally  home in August 2020. Pt living with a friend but functioning independently.      Hand Dominance   Dominant Hand: Right    Extremity/Trunk Assessment   Upper Extremity Assessment Upper Extremity Assessment: Defer to OT evaluation LUE Deficits / Details: forearm cast/splint LUE: Unable to fully assess due to immobilization    Lower Extremity Assessment Lower Extremity Assessment: LLE deficits/detail LLE Deficits / Details: fall with hip fx, s/p THA direct anterior approach       Communication   Communication: No difficulties  Cognition Arousal/Alertness: Awake/alert Behavior During Therapy: WFL for tasks assessed/performed Overall Cognitive Status: Within Functional Limits for tasks assessed                                        General Comments      Exercises Other Exercises Other Exercises: pt instructed on ROM exercises of L fingers, elbow and shoulder   Assessment/Plan    PT Assessment Patient needs continued PT services  PT Problem List Decreased strength;Decreased mobility;Decreased knowledge of precautions;Decreased activity tolerance;Pain;Decreased balance;Decreased knowledge of use of DME       PT Treatment Interventions DME instruction;Therapeutic activities;Gait training;Therapeutic exercise;Patient/family education;Balance training;Functional mobility training    PT Goals (Current goals can be found in the Care Plan section)  Acute Rehab PT Goals Patient Stated Goal: independence PT Goal Formulation: With patient Time For Goal Achievement: 08/07/19 Potential to Achieve Goals: Good    Frequency Min 4X/week   Barriers to discharge        Co-evaluation PT/OT/SLP Co-Evaluation/Treatment: Yes Reason for Co-Treatment: Complexity of the patient's impairments (multi-system involvement);For patient/therapist safety;To address functional/ADL transfers PT goals addressed during session: Mobility/safety with mobility;Balance;Proper use of DME OT goals addressed during session: ADL's and self-care       AM-PAC  PT "6 Clicks" Mobility  Outcome Measure Help needed turning from your back to your side while in a flat bed without using bedrails?: A Lot Help needed moving from lying on your back to sitting on the side of a flat bed without using bedrails?: A Lot Help needed moving to and from a bed to a chair (including a wheelchair)?: A Lot Help needed standing up from a chair using your arms (e.g., wheelchair or bedside chair)?: A Lot Help needed to walk in hospital room?: A Lot Help needed climbing 3-5 steps with a railing? : Total 6 Click Score: 11    End of Session Equipment Utilized During Treatment: Gait belt Activity Tolerance: Patient limited by pain Patient left: in chair;with call bell/phone within reach Nurse Communication: Mobility status PT Visit Diagnosis: Other abnormalities of gait and mobility (R26.89);Difficulty in walking, not elsewhere classified (R26.2);Pain Pain - Right/Left: Left Pain - part of body: Hip    Time: 1100-1132 PT Time Calculation (min) (ACUTE ONLY): 32 min   Charges:   PT Evaluation $PT Eval Moderate Complexity: 1 Mod          Lorrin Goodell, PT  Office # [phone] Pager [phone]   Lorriane Shire 07/24/2019, 1:50 PM

## 2019-07-24 NOTE — Progress Notes (Signed)
Inpatient Rehabilitation Admissions Coordinator  Inpatient rehab consult received. I met with patient at bedside for rehab assessment. We discussed goals and expectations of an inpt rehab admit. He prefers inpt rehab rather than SNF. He was at [facility] 09/2018 for a month and then discharged to ALF until July and now has rented a room from a friend. I have begun insurance aouthorization with [place] for a possible admit pending their approval. I will follow up tomorrow.  Danne Baxter, RN, MSN Rehab Admissions Coordinator [phone] 07/24/2019 4:42 PM

## 2019-07-25 LAB — CBC
HCT: 29.5 % — ABNORMAL LOW (ref 39.0–52.0)
Hemoglobin: 9.8 g/dL — ABNORMAL LOW (ref 13.0–17.0)
MCH: 33.6 pg (ref 26.0–34.0)
MCHC: 33.2 g/dL (ref 30.0–36.0)
MCV: 101 fL — ABNORMAL HIGH (ref 80.0–100.0)
Platelets: 56 10*3/uL — ABNORMAL LOW (ref 150–400)
RBC: 2.92 MIL/uL — ABNORMAL LOW (ref 4.22–5.81)
RDW: 15.8 % — ABNORMAL HIGH (ref 11.5–15.5)
WBC: 8.3 10*3/uL (ref 4.0–10.5)
nRBC: 0 % (ref 0.0–0.2)

## 2019-07-25 LAB — BASIC METABOLIC PANEL
Anion gap: 7 (ref 5–15)
BUN: 19 mg/dL (ref 6–20)
CO2: 25 mmol/L (ref 22–32)
Calcium: 8.7 mg/dL — ABNORMAL LOW (ref 8.9–10.3)
Chloride: 103 mmol/L (ref 98–111)
Creatinine, Ser: 0.7 mg/dL (ref 0.61–1.24)
GFR calc Af Amer: >60 mL/min (ref >60)
GFR calc non Af Amer: >60 mL/min (ref >60)
Glucose, Bld: 115 mg/dL — ABNORMAL HIGH (ref 70–99)
Potassium: 4.2 mmol/L (ref 3.5–5.1)
Sodium: 135 mmol/L (ref 135–145)

## 2019-07-25 MED ORDER — ASPIRIN EC 81 MG PO TBEC: DELAYED_RELEASE_TABLET | ORAL | 0 refills | Status: DC

## 2019-07-25 NOTE — TOC Initial Note (Addendum)
Transition of Care [facility]) - Initial/Assessment Note    Patient Details  Name: [name] MRN: [id number] Date of Birth: [birth year]  [facility]) CM/SW Contact:    Marilu Favre, RN Phone Number: 07/25/2019, 10:44 AM  Clinical Narrative:                 Confirmed face sheet information. Insurance has denied CIR . Discussed SNF with patient. Patient declining SNF at this time. States he has a roommate at home who can assist him until August 27, 2019. Patient also has someone else who can help him at home starting tomorrow.  Patient requesting home health, walker with left platform, 3 in 1 and tub bench. Messaged MD for orders. Received orders. Called Zack with [employer] to bring DME to room today.   Tiffany with [facility] can accept home health referral. Tiffany aware discharge planned for tomorrow 07/26/19  Update, patient now has decided to go to SNF at discharge , after speaking with his roommate/landlord.   [facility] with [facility] aware and Zack with [facility] aware.  [place] [phone] to begin insurance authorization, faxed updated clinicals to [phone]. Ref number [id number]   Denyse Amass will submit for PASAR   Expected Discharge Plan: [place]     Patient Goals and CMS Choice Patient states their goals for this hospitalization and ongoing recovery are:: to go home CMS Medicare.gov Compare Post Acute Care list provided to:: Patient    Expected Discharge Plan and Services Expected Discharge Plan: [place] Choice: Home Health, Durable Medical Equipment Living arrangements for the past 2 months: Single Family Home                                      Prior Living Arrangements/Services Living arrangements for the past 2 months: Single Family Home Lives with:: Roommate Patient language and need for interpreter reviewed:: Yes Do you feel safe going back to the place  where you live?: Yes      Need for Family Participation in Patient Care: Yes (Comment) Care giver support system in place?: Yes (comment)   Criminal Activity/Legal Involvement Pertinent to Current Situation/Hospitalization: No - Comment as needed  Activities of Daily Living Home Assistive Devices/Equipment: None ADL Screening (condition at time of admission) Patient's cognitive ability adequate to safely complete daily activities?: Yes Is the patient deaf or have difficulty hearing?: No Does the patient have difficulty seeing, even when wearing glasses/contacts?: No Does the patient have difficulty concentrating, remembering, or making decisions?: No Patient able to express need for assistance with ADLs?: Yes Does the patient have difficulty dressing or bathing?: Yes Independently performs ADLs?: No Communication: Independent Dressing (OT): Needs assistance Is this a change from baseline?: Change from baseline, expected to last >3 days Grooming: Needs assistance Is this a change from baseline?: Change from baseline, expected to last >3 days Feeding: Independent Bathing: Needs assistance Is this a change from baseline?: Change from baseline, expected to last >3 days Toileting: Independent In/Out Bed: Dependent Is this a change from baseline?: Change from baseline, expected to last >3 days Walks in Home: Independent Does the patient have difficulty walking or climbing stairs?: Yes Weakness of Legs: Both Weakness of Arms/Hands: Both  Permission Sought/Granted         Permission granted to  share info w AGENCY: [facility]        Emotional Assessment Appearance:: Appears stated age Attitude/Demeanor/Rapport: Engaged Affect (typically observed): Accepting Orientation: : Oriented to Self, Oriented to Place, Oriented to  Time, Oriented to Situation Alcohol / Substance Use: Not Applicable Psych Involvement: No (comment)  Admission diagnosis:  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 Left displaced femoral neck fracture ([place])   . Closed fracture of left distal radius and ulna, initial encounter   . Closed left hip fracture ([place]) 07/22/2019  . Left wrist fracture 07/22/2019  . Rib fracture 07/22/2019  . Ascites 04/16/2019  . Displaced fracture of distal end of right radius 12/23/2016  . Seizure ([place]) 10/29/2016  . Possible exposure to STD 06/07/2016  . S/P total knee replacement using cement 07/15/2015  . Chest pain 04/30/2015  . Left inguinal hernia 10/22/2014  . Hyponatremia 05/06/2014  . Diarrhea 04/30/2014  . UTI (urinary tract infection) 07/29/2013  . Tibial plateau fracture, tibial shaft fracture 07/24/2013  . Erectile dysfunction 01/07/2013  . Hypokalemia 10/21/2011  . Eczema 10/21/2011  . Impaired glucose tolerance 10/19/2011  . Hepatic encephalopathy ([place]) 08/05/2011  . Depression   . Seizures ([place])   . Wilson disease   . Encephalopathy   . Thrombocytopenia ([place])   . Chronic liver disease and cirrhosis ([place])   . History of alcohol abuse   . Preventative health care 04/14/2011  . Bipolar affective disorder ([place])    PCP:  Biagio Borg, MD Pharmacy:   [facility] [place], [place] [address] [place] [address] Phone: [phone] Fax: [phone]     Social Determinants of Health (SDOH) Interventions    Readmission Risk Interventions No flowsheet data found.

## 2019-07-25 NOTE — Progress Notes (Addendum)
Subjective: 2 Days Post-Op Procedure(s) (LRB): TOTAL HIP ARTHROPLASTY ANTERIOR APPROACH (Left) OPEN REDUCTION INTERNAL FIXATION (ORIF) WRIST FRACTURE (Left) Patient reports pain as mild.  Hip a little more sore today.  Objective: Vital signs in last 24 hours: Temp:  [97.9 F (36.6 C)-98.2 F (36.8 C)] 98.2 F (36.8 C) (11/20 0344) Pulse Rate:  [75-97] 75 (11/20 0344) Resp:  [16-17] 17 (11/20 0344) BP: (114-122)/(65-66) 114/66 (11/20 0344) SpO2:  [94 %-99 %] 99 % (11/20 0344)  Intake/Output from previous day: 11/19 0701 - 11/20 0700 In: 720 [P.O.:720] Out: 2700 [Urine:2700] Intake/Output this shift: No intake/output data recorded.  Recent Labs    07/22/19 0838 07/23/19 0246 07/24/19 0356 07/25/19 0425  HGB 12.6* 11.9* 10.8* 9.8*   Recent Labs    07/24/19 0356 07/25/19 0425  WBC 10.6* 8.3  RBC 3.22* 2.92*  HCT 32.3* 29.5*  PLT 58* 56*   Recent Labs    07/24/19 0356 07/25/19 0425  NA 130* 135  K 4.2 4.2  CL 100 103  CO2 23 25  BUN 15 19  CREATININE 0.89 0.70  GLUCOSE 148* 115*  CALCIUM 8.6* 8.7*   No results for input(s): LABPT, INR in the last 72 hours.  Neurologically intact Neurovascular intact Sensation intact distally Intact pulses distally Dorsiflexion/Plantar flexion intact Incision: scant drainage No cellulitis present Compartment soft   Assessment/Plan: 2 Days Post-Op Procedure(s) (LRB): TOTAL HIP ARTHROPLASTY ANTERIOR APPROACH (Left) OPEN REDUCTION INTERNAL FIXATION (ORIF) WRIST FRACTURE (Left) Up with therapy WBAT LLE Platform weight bearing LUE Ice and elevate LUE for pain and swelling ASA 81 mg bid x 6 weeks for dvt ppx F/u with Dr. Erlinda Hong 2 weeks post-op for suture removal      Aundra Dubin 07/25/2019, 7:52 AM

## 2019-07-25 NOTE — Progress Notes (Signed)
PROGRESS NOTE  [name]  DOB: 29-Feb-1966  PCP: Biagio Borg, MD [id number]  DOA: 07/21/2019  LOS: 3 days   Chief Complaint  Patient presents with  . Seizures  . Injuries from seizure   Brief narrative: Patient is a 53 y.o. 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 He presented to the ED on 11/16 with seizure leading to fall and fractures.  Patient states that he is compliant to [place] at home. He tried CBD oil for pain on 11/15. He thinks that might have led him to have seizures on 11/16. She was found in a postictal state and complained of pain in left wrist and left hip area. Patient denies taking any alcohol since January 2020. Work-up in the ED showed comminuted and dorsally impacted distal left radius fracture, mildly comminuted and displaced ulnar styloid fracture and also a mildly impacted left femoral neck fracture.  Subjective: Patient was seen and examined this morning. Lying down in bed.  Not in distress.  Pain controlled. I did appear to be with [place] healthcare this morning.  Not approved for inpatient rehab.  Recommended subacute rehab.   Labs from this morning noted, platelet level is low but stable at 56.  Hemoglobin further down, 9.8 today.  Assessment/Plan: Closed left hip fracture  -Status post left total hip replacement. -Pain control. -Weightbearing as tolerated  Left radial and ulnar fracture -S/p ORIF. -Platform weightbearing per orthopedics.  Ice application and elevate LUE for pain and swelling.  Rib fracture -conservative measures control incentive spirometry  Liver cirrhosis secondary to alcohol use and Wilson's disease Chronic thrombocytopenia -Follows up with hepatologist at [place] meds include Lasix 80 mg twice daily, potassium replacement, Aldactone, Xifaxan. Continue all. -Also reports history of  variceal bleeding. Currently no evidence of bleeding but platelet level is chronically low, 56 today.  Currently on aspirin 81 mg twice daily for DVT prophylaxis.   History of alcohol abuse -currently in remission no evidence of alcohol withdrawal  Bipolar affective disorder - chronic currently stable  History of seizure disorder - takes Keppra at home. Reports compliance. Patient states that he tried CBD oil for pain which may have lowered seizure threshold.  Mobility: PT eval DVT prophylaxis:  Currently on aspirin 81 mg twice daily for 6 weeks per orthopedics. Code Status:   Code Status: Full Code  Family Communication: none at bedside Expected Discharge:  Inpatient rehab denied by insurance company.  SNF recommended.  Consultants:  Orthopedics  Procedures:    Antimicrobials: Anti-infectives (From admission, onward)   Start     Dose/Rate Route Frequency Ordered Stop   07/23/19 1530  ceFAZolin (ANCEF) IVPB 2g/100 mL premix     2 g 200 mL/hr over 30 Minutes Intravenous Every 6 hours 07/23/19 1434 07/24/19 0305   07/23/19 1105  vancomycin (VANCOCIN) powder  Status:  Discontinued       As needed 07/23/19 1105 07/23/19 1300   07/23/19 0600  ceFAZolin (ANCEF) IVPB 2g/100 mL premix     2 g 200 mL/hr over 30 Minutes Intravenous On call to O.R. 07/22/19 1342 07/23/19 0958   07/22/19 1000  rifaximin (XIFAXAN) tablet 550 mg     550 mg Oral 3 times daily 07/22/19 0400        Diet Order            Diet regular Room service appropriate? Yes; Fluid consistency: Thin  Diet effective now  Infusions:  . sodium chloride Stopped (07/22/19 0240)  . sodium chloride Stopped (07/23/19 1447)  . methocarbamol (ROBAXIN) IV      Scheduled Meds: . aspirin EC  81 mg Oral BID  . docusate sodium  100 mg Oral BID  . feeding supplement (ENSURE ENLIVE)  237 mL Oral BID BM  . folic acid  1 mg Oral Daily  . furosemide  80 mg Oral BID  . lactulose  10 g Oral TID  .  levETIRAcetam  1,500 mg Oral BID  . multivitamin with minerals  1 tablet Oral Daily  . oxyCODONE  10 mg Oral Q12H  . rifaximin  550 mg Oral TID  . spironolactone  200 mg Oral q morning - 10a    PRN meds: acetaminophen, alum & mag hydroxide-simeth, bisacodyl, HYDROcodone-acetaminophen, HYDROmorphone (DILAUDID) injection, LORazepam, magnesium citrate, menthol-cetylpyridinium **OR** phenol, methocarbamol **OR** methocarbamol (ROBAXIN) IV, morphine injection, ondansetron **OR** ondansetron (ZOFRAN) IV, oxyCODONE, oxyCODONE, polyethylene glycol, sodium phosphate, sorbitol   Objective: Vitals:   07/25/19 0344 07/25/19 0810  BP: 114/66 (!) 115/55  Pulse: 75 75  Resp: 17 16  Temp: 98.2 F (36.8 C) 98.3 F (36.8 C)  SpO2: 99% 97%    Intake/Output Summary (Last 24 hours) at 07/25/2019 1030 Last data filed at 07/25/2019 0435 Gross per 24 hour  Intake 480 ml  Output 2400 ml  Net -1920 ml   Filed Weights   07/21/19 1920  Weight: 90.7 kg   Weight change:  Body mass index is 26.39 kg/m.   Physical Exam: General exam: Appears calm and comfortable.  Not in physical distress. Skin: No rashes, lesions or ulcers. HEENT: Atraumatic, normocephalic, supple neck, no obvious bleeding Lungs: Clear to auscultation bilaterally CVS: Regular rate and rhythm, no murmur GI/Abd soft, nontender, nondistended, bowel sound present CNS: Alert, awake, oriented x3. No alcohol-related tremors. Psychiatry: Mood appropriate Extremities: No pedal edema, no calf tenderness. Left upper extremity in cast.  Data Review: I have personally reviewed the laboratory data and studies available.  Recent Labs  Lab 07/21/19 2213 07/22/19 0838 07/23/19 0246 07/24/19 0356 07/25/19 0425  WBC 5.0 5.3 7.1 10.6* 8.3  NEUTROABS  --   --  5.4  --   --   HGB 10.6* 12.6* 11.9* 10.8* 9.8*  HCT 31.7* 37.9* 35.0* 32.3* 29.5*  MCV 101.3* 103.0* 98.6 100.3* 101.0*  PLT 53* 54* 56* 58* 56*   Recent Labs  Lab 07/21/19 2213  07/22/19 0838 07/23/19 0246 07/24/19 0356 07/25/19 0425  NA 134* 130* 135 130* 135  K 3.4* 4.7 4.1 4.2 4.2  CL 109 102 104 100 103  CO2 20* 23 25 23 25   GLUCOSE 92 90 104* 148* 115*  BUN 16 15 15 15 19   CREATININE 0.55* 0.65 0.72 0.89 0.70  CALCIUM 7.5* 8.7* 9.1 8.6* 8.7*    [name], MD  [facility] 07/25/2019

## 2019-07-25 NOTE — Progress Notes (Signed)
Physical Therapy Treatment Patient Details Name: [name] MRN: [id number] DOB: 30-Apr-1966 Today's Date: 07/25/2019    History of Present Illness Patient is a 53 y.o. male with medical history significant of alcoholic liver disease complicated by [place] Pulling disease, seizure disorder, bipolar, depression, dyslipidemiaVentral hernia. He presented to the ED on 11/16 with seizure leading to fall and L hip and wrist fractures. Pt s/p L total L hip athroplasty anterior approach and L wrist ORIF    PT Comments    Pt required mod assist bed mobility, mod assist sit to stand and min assist ambulation 30 feet with L platform RW. Pt performed LE exercises in recliner. Pt's insurance denied CIR. Discharge recommendation updated to SNF. Pt agreeable. Pt was hopeful for d/c home with HHPT. Pt's roommate, however, is unable to provide the physical assist that pt will need.     Follow Up Recommendations  SNF;Supervision/Assistance - 24 hour     Equipment Recommendations  Rolling walker with 5" wheels;Other (comment);3in1 (PT)    Recommendations for Other Services       Precautions / Restrictions Precautions Precautions: Fall Required Braces or Orthoses: Splint/Cast;Sling Splint/Cast: L wrist Restrictions LUE Weight Bearing: Weight bear through elbow only LLE Weight Bearing: Weight bearing as tolerated    Mobility  Bed Mobility Overal bed mobility: Needs Assistance Bed Mobility: Supine to Sit     Supine to sit: Mod assist;HOB elevated     General bed mobility comments: +rail, increased time, cues for sequencing, use of bed pad to scoot to EOB  Transfers Overall transfer level: Needs assistance Equipment used: Left platform walker Transfers: Sit to/from Stand Sit to Stand: Mod assist         General transfer comment: cues for hand placement, assist to power up, increased time  Ambulation/Gait Ambulation/Gait assistance: Min assist Gait Distance (Feet): 30 Feet Assistive  device: Left platform walker Gait Pattern/deviations: Step-through pattern;Decreased stride length Gait velocity: decreased Gait velocity interpretation: <1.8 ft/sec, indicate of risk for recurrent falls General Gait Details: cues for sequencing   Stairs             Wheelchair Mobility    Modified Rankin (Stroke Patients Only)       Balance Overall balance assessment: Needs assistance Sitting-balance support: No upper extremity supported;Feet supported Sitting balance-Leahy Scale: Fair     Standing balance support: Bilateral upper extremity supported;During functional activity Standing balance-Leahy Scale: Poor Standing balance comment: reliant on external support                            Cognition Arousal/Alertness: Awake/alert Behavior During Therapy: WFL for tasks assessed/performed Overall Cognitive Status: Within Functional Limits for tasks assessed                                        Exercises Total Joint Exercises Ankle Circles/Pumps: AROM;Both;10 reps Heel Slides: AAROM;Left;10 reps Hip ABduction/ADduction: AAROM;Left;10 reps    General Comments        Pertinent Vitals/Pain Pain Assessment: 0-10 Pain Score: 3  Pain Location: L LE Pain Descriptors / Indicators: Discomfort;Sore Pain Intervention(s): Monitored during session;Repositioned    Home Living                      Prior Function            PT Goals (current goals  can now be found in the care plan section) Acute Rehab PT Goals Patient Stated Goal: independence Progress towards PT goals: Progressing toward goals    Frequency    Min 3X/week      PT Plan Discharge plan needs to be updated;Frequency needs to be updated    Co-evaluation              AM-PAC PT "6 Clicks" Mobility   Outcome Measure  Help needed turning from your back to your side while in a flat bed without using bedrails?: A Little Help needed moving from lying on  your back to sitting on the side of a flat bed without using bedrails?: A Lot Help needed moving to and from a bed to a chair (including a wheelchair)?: A Lot Help needed standing up from a chair using your arms (e.g., wheelchair or bedside chair)?: A Lot Help needed to walk in hospital room?: A Lot Help needed climbing 3-5 steps with a railing? : Total 6 Click Score: 12    End of Session Equipment Utilized During Treatment: Gait belt Activity Tolerance: Patient tolerated treatment well Patient left: in chair;with call bell/phone within reach Nurse Communication: Mobility status PT Visit Diagnosis: Other abnormalities of gait and mobility (R26.89);Difficulty in walking, not elsewhere classified (R26.2);Pain Pain - Right/Left: Left Pain - part of body: Hip     Time: 1211-1236 PT Time Calculation (min) (ACUTE ONLY): 25 min  Charges:  $Gait Training: 8-22 mins $Therapeutic Exercise: 8-22 mins                     Lorrin Goodell, PT  Office # [phone] Pager [phone]    Lorriane Shire 07/25/2019, 12:49 PM

## 2019-07-25 NOTE — NC FL2 (Signed)
[place] LEVEL OF CARE SCREENING TOOL     IDENTIFICATION  Patient Name: [name] Birthdate: 17-Feb-1966 Sex: male Admission Date (Current Location): 07/21/2019  [facility] and [place] Number:  Herbalist and Address:  The . [facility], [place] [address], [place],  [address]      Provider Number: [id number]  Attending Physician Name and Address:  [name], MD  Relative Name and Phone Number:  Luretha Murphy [phone]    Current Level of Care: Hospital Recommended Level of Care: [place] Prior Approval Number:    Date Approved/Denied:   PASRR Number: [id number]  Discharge Plan: SNF    Current Diagnoses: Patient Active Problem List   Diagnosis Date Noted  . Left displaced femoral neck fracture ([place])   . Closed fracture of left distal radius and ulna, initial encounter   . Closed left hip fracture ([place]) 07/22/2019  . Left wrist fracture 07/22/2019  . Rib fracture 07/22/2019  . Ascites 04/16/2019  . Displaced fracture of distal end of right radius 12/23/2016  . Seizure ([place]) 10/29/2016  . Possible exposure to STD 06/07/2016  . S/P total knee replacement using cement 07/15/2015  . Chest pain 04/30/2015  . Left inguinal hernia 10/22/2014  . Hyponatremia 05/06/2014  . Diarrhea 04/30/2014  . UTI (urinary tract infection) 07/29/2013  . Tibial plateau fracture, tibial shaft fracture 07/24/2013  . Erectile dysfunction 01/07/2013  . Hypokalemia 10/21/2011  . Eczema 10/21/2011  . Impaired glucose tolerance 10/19/2011  . Hepatic encephalopathy ([place]) 08/05/2011  . Depression   . Seizures ([place])   . Wilson disease   . Encephalopathy   . Thrombocytopenia ([place])   . Chronic liver disease and cirrhosis ([place])   . History of alcohol abuse   . Preventative health care 04/14/2011  . Bipolar affective disorder ([place])     Orientation RESPIRATION BLADDER Height & Weight     Self, Time, Situation,  Place  Normal Continent Weight: 90.7 kg Height:  6\' 1"  (185.4 cm)  BEHAVIORAL SYMPTOMS/MOOD NEUROLOGICAL BOWEL NUTRITION STATUS    Convulsions/Seizures Continent Diet  AMBULATORY STATUS COMMUNICATION OF NEEDS Skin   Limited Assist Verbally Normal, Surgical wounds                       Personal Care Assistance Level of Assistance  Bathing, Dressing, Feeding Bathing Assistance: Maximum assistance Feeding assistance: Limited assistance Dressing Assistance: Limited assistance     Functional Limitations Info    Sight Info: Adequate Hearing Info: Adequate Speech Info: Adequate    SPECIAL CARE FACTORS FREQUENCY  PT (By licensed PT), OT (By licensed OT)     PT Frequency: five times a week OT Frequency: Five times a week            Contractures Contractures Info: Not present    Additional Factors Info  Code Status, Allergies Code Status Info: full code Allergies Info: Codeine           Current Medications (07/25/2019):  This is the current hospital active medication list Current Facility-Administered Medications  Medication Dose Route Frequency Provider Last Rate Last Dose  . 0.9 %  sodium chloride infusion   Intravenous Continuous Leandrew Koyanagi, MD   Stopped at 07/22/19 0240  . 0.9 %  sodium chloride infusion   Intravenous Continuous Leandrew Koyanagi, MD   Stopped at 07/23/19 1447  . acetaminophen (TYLENOL) tablet 325-650 mg  325-650 mg Oral Q6H PRN Erlinda Hong,  Marylynn Pearson, MD      . alum & mag hydroxide-simeth (MAALOX/MYLANTA) 200-200-20 MG/5ML suspension 30 mL  30 mL Oral Q4H PRN Leandrew Koyanagi, MD      . aspirin EC tablet 81 mg  81 mg Oral BID [name], MD   81 mg at 07/25/19 1028  . bisacodyl (DULCOLAX) suppository 10 mg  10 mg Rectal Daily PRN Leandrew Koyanagi, MD      . docusate sodium (COLACE) capsule 100 mg  100 mg Oral BID Leandrew Koyanagi, MD   100 mg at 07/25/19 1027  . feeding supplement (ENSURE ENLIVE) (ENSURE ENLIVE) liquid 237 mL  237 mL Oral BID BM Leandrew Koyanagi,  MD   237 mL at [id number]  . folic acid (FOLVITE) tablet 1 mg  1 mg Oral Daily Leandrew Koyanagi, MD   1 mg at 07/25/19 1027  . furosemide (LASIX) tablet 80 mg  80 mg Oral BID Leandrew Koyanagi, MD   80 mg at 07/25/19 1027  . HYDROcodone-acetaminophen (NORCO/VICODIN) 5-325 MG per tablet 1-2 tablet  1-2 tablet Oral Q6H PRN Leandrew Koyanagi, MD   1 tablet at 07/24/19 0231  . HYDROmorphone (DILAUDID) injection 0.5-1 mg  0.5-1 mg Intravenous Q4H PRN Leandrew Koyanagi, MD      . lactulose (CHRONULAC) 10 GM/15ML solution 10 g  10 g Oral TID Leandrew Koyanagi, MD   10 g at 07/25/19 1026  . levETIRAcetam (KEPPRA) tablet 1,500 mg  1,500 mg Oral BID Karren Cobble, RPH   1,500 mg at 07/25/19 1028  . LORazepam (ATIVAN) injection 1 mg  1 mg Intravenous Q4H PRN Leandrew Koyanagi, MD      . magnesium citrate solution 1 Bottle  1 Bottle Oral Once PRN Leandrew Koyanagi, MD      . menthol-cetylpyridinium (CEPACOL) lozenge 3 mg  1 lozenge Oral PRN Leandrew Koyanagi, MD       Or  . phenol (CHLORASEPTIC) mouth spray 1 spray  1 spray Mouth/Throat PRN Leandrew Koyanagi, MD      . methocarbamol (ROBAXIN) tablet 500 mg  500 mg Oral Q6H PRN Leandrew Koyanagi, MD   500 mg at 07/23/19 2126   Or  . methocarbamol (ROBAXIN) 500 mg in dextrose 5 % 50 mL IVPB  500 mg Intravenous Q6H PRN Leandrew Koyanagi, MD      . morphine 2 MG/ML injection 0.5 mg  0.5 mg Intravenous Q2H PRN Leandrew Koyanagi, MD   0.5 mg at 07/22/19 2100  . multivitamin with minerals tablet 1 tablet  1 tablet Oral Daily Leandrew Koyanagi, MD   1 tablet at 07/25/19 1028  . ondansetron (ZOFRAN) tablet 4 mg  4 mg Oral Q6H PRN Leandrew Koyanagi, MD       Or  . ondansetron [facility]) injection 4 mg  4 mg Intravenous Q6H PRN Leandrew Koyanagi, MD      . oxyCODONE (Oxy IR/ROXICODONE) immediate release tablet 10-15 mg  10-15 mg Oral Q4H PRN Leandrew Koyanagi, MD      . oxyCODONE (Oxy IR/ROXICODONE) immediate release tablet 5-10 mg  5-10 mg Oral Q4H PRN Leandrew Koyanagi, MD      . oxyCODONE (OXYCONTIN) 12 hr tablet 10 mg   10 mg Oral Q12H Leandrew Koyanagi, MD   10 mg at 07/25/19 1028  . polyethylene glycol (MIRALAX / GLYCOLAX) packet 17 g  17 g Oral Daily PRN Leandrew Koyanagi, MD      .  rifaximin (XIFAXAN) tablet 550 mg  550 mg Oral TID Leandrew Koyanagi, MD   550 mg at 07/25/19 1027  . sodium phosphate (FLEET) 7-19 GM/118ML enema 1 enema  1 enema Rectal Once PRN Leandrew Koyanagi, MD      . sorbitol 70 % solution 30 mL  30 mL Oral Daily PRN Leandrew Koyanagi, MD      . spironolactone (ALDACTONE) tablet 200 mg  200 mg Oral q morning - 10a Leandrew Koyanagi, MD   200 mg at 07/25/19 1027     Discharge Medications: Please see discharge summary for a list of discharge medications.  Relevant Imaging Results:  Relevant Lab Results:   Additional Information SSI [id number] , Seizure disorder Keppra 1500 mg PO BID, Wilson disease , Bipolar , depression , 11/18 ORIF of left distal radius, 07/23/19  left total hip arthoplasty  Rashanda Magloire, Edson Snowball, RN

## 2019-07-25 NOTE — Plan of Care (Signed)

## 2019-07-25 NOTE — Plan of Care (Signed)
  Problem: Activity: Goal: Risk for activity intolerance will decrease Outcome: Progressing   Problem: Coping: Goal: Level of anxiety will decrease Outcome: Progressing   Problem: Pain Managment: Goal: General experience of comfort will improve Outcome: Progressing   Problem: Safety: Goal: Ability to remain free from injury will improve Outcome: Progressing   Problem: Skin Integrity: Goal: Risk for impaired skin integrity will decrease Outcome: Progressing   

## 2019-07-25 NOTE — Progress Notes (Signed)
Inpatient Rehabilitation Admissions Coordinator  I have received a denial from [facility] Medicare for inpt rehab admit. Dr. Pietro Cassis also completed a peer to peer with Insurance MD. I met with patient at bedside and he is aware. He refuses SNF but does not have adequate support at home at current functional level to directly d/c home. I have alerted RN CM, Heather. We will sign off at this time.  Danne Baxter, RN, MSN Rehab Admissions Coordinator [phone] 07/25/2019 10:25 AM

## 2019-07-26 LAB — CBC
HCT: 29.4 % — ABNORMAL LOW (ref 39.0–52.0)
Hemoglobin: 9.8 g/dL — ABNORMAL LOW (ref 13.0–17.0)
MCH: 33.9 pg (ref 26.0–34.0)
MCHC: 33.3 g/dL (ref 30.0–36.0)
MCV: 101.7 fL — ABNORMAL HIGH (ref 80.0–100.0)
Platelets: 63 10*3/uL — ABNORMAL LOW (ref 150–400)
RBC: 2.89 MIL/uL — ABNORMAL LOW (ref 4.22–5.81)
RDW: 16.1 % — ABNORMAL HIGH (ref 11.5–15.5)
WBC: 7.4 10*3/uL (ref 4.0–10.5)
nRBC: 0 % (ref 0.0–0.2)

## 2019-07-26 NOTE — Progress Notes (Signed)
PROGRESS NOTE  [name]  DOB: 11-18-1965  PCP: Biagio Borg, MD [id number]  DOA: 07/21/2019  LOS: 4 days   Chief Complaint  Patient presents with  . Seizures  . Injuries from seizure   Brief narrative: Patient is a 53 y.o. 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 He presented to the ED on 11/16 with seizure leading to fall and fractures.  Patient states that he is compliant to [name] at home. He tried CBD oil for pain on 11/15. He thinks that might have led him to have seizures on 11/16. She was found in a postictal state and complained of pain in left wrist and left hip area. Patient denies taking any alcohol since January 2020. Work-up in the ED showed comminuted and dorsally impacted distal left radius fracture, mildly comminuted and displaced ulnar styloid fracture and also a mildly impacted left femoral neck fracture.  Subjective: Patient was seen and examined this morning. Lying down in bed.  Not in distress.  Pain controlled.  Assessment/Plan: Closed left hip fracture  -Status post left total hip replacement. -Pain control. -Weightbearing as tolerated. -Patient initially insisted on going home.  He has changed his mind and wants to go to SNF.  Pending authorization.  Left radial and ulnar fracture -S/p ORIF. -Platform weightbearing per orthopedics.  Ice application and elevate LUE for pain and swelling.  Rib fracture -conservative measures control incentive spirometry  Liver cirrhosis secondary to alcohol use and Wilson's disease Chronic thrombocytopenia -Follows up with hepatologist at [facility] meds include Lasix 80 mg twice daily, potassium replacement, Aldactone, Xifaxan. Continue all. -Also reports history of variceal bleeding. Currently no evidence of bleeding but platelet level is chronically low, 56 today.  Currently on  aspirin 81 mg twice daily for DVT prophylaxis.   History of alcohol abuse -currently in remission no evidence of alcohol withdrawal  Bipolar affective disorder - chronic currently stable  History of seizure disorder - takes Keppra at home. Reports compliance. Patient states that he tried CBD oil for pain which may have lowered seizure threshold. No seizure episode at home.  Mobility: PT eval DVT prophylaxis:  Currently on aspirin 81 mg twice daily for 6 weeks per orthopedics. Code Status:   Code Status: Full Code  Family Communication: none at bedside Expected Discharge:  Inpatient rehab denied by insurance company.  SNF recommended.  Consultants:  Orthopedics  Procedures:    Antimicrobials: Anti-infectives (From admission, onward)   Start     Dose/Rate Route Frequency Ordered Stop   07/23/19 1530  ceFAZolin (ANCEF) IVPB 2g/100 mL premix     2 g 200 mL/hr over 30 Minutes Intravenous Every 6 hours 07/23/19 1434 07/24/19 0305   07/23/19 1105  vancomycin (VANCOCIN) powder  Status:  Discontinued       As needed 07/23/19 1105 07/23/19 1300   07/23/19 0600  ceFAZolin (ANCEF) IVPB 2g/100 mL premix     2 g 200 mL/hr over 30 Minutes Intravenous On call to O.R. 07/22/19 1342 07/23/19 0958   07/22/19 1000  rifaximin (XIFAXAN) tablet 550 mg     550 mg Oral 3 times daily 07/22/19 0400        Diet Order            Diet regular Room service appropriate? Yes; Fluid consistency: Thin  Diet effective now              Infusions:  .  sodium chloride Stopped (07/22/19 0240)  . sodium chloride Stopped (07/23/19 1447)  . methocarbamol (ROBAXIN) IV      Scheduled Meds: . aspirin EC  81 mg Oral BID  . docusate sodium  100 mg Oral BID  . feeding supplement (ENSURE ENLIVE)  237 mL Oral BID BM  . folic acid  1 mg Oral Daily  . furosemide  80 mg Oral BID  . lactulose  10 g Oral TID  . levETIRAcetam  1,500 mg Oral BID  . multivitamin with minerals  1 tablet Oral Daily  . oxyCODONE   10 mg Oral Q12H  . rifaximin  550 mg Oral TID  . spironolactone  200 mg Oral q morning - 10a    PRN meds: acetaminophen, alum & mag hydroxide-simeth, bisacodyl, HYDROcodone-acetaminophen, HYDROmorphone (DILAUDID) injection, LORazepam, magnesium citrate, menthol-cetylpyridinium **OR** phenol, methocarbamol **OR** methocarbamol (ROBAXIN) IV, morphine injection, ondansetron **OR** ondansetron (ZOFRAN) IV, oxyCODONE, oxyCODONE, polyethylene glycol, sodium phosphate, sorbitol   Objective: Vitals:   07/26/19 0326 07/26/19 0759  BP: 108/63 (!) 110/52  Pulse: 79 70  Resp: 17 16  Temp: 98.1 F (36.7 C) 98 F (36.7 C)  SpO2: 96% 97%    Intake/Output Summary (Last 24 hours) at 07/26/2019 1308 Last data filed at 07/26/2019 0816 Gross per 24 hour  Intake -  Output 800 ml  Net -800 ml   Filed Weights   07/21/19 1920  Weight: 90.7 kg   Weight change:  Body mass index is 26.39 kg/m.   Physical Exam: General exam: Appears calm and comfortable.  Not in physical distress. Skin: No rashes, lesions or ulcers. HEENT: Atraumatic, normocephalic, supple neck, no obvious bleeding Lungs: Clear to auscultation bilaterally CVS: Regular rate and rhythm, no murmur GI/Abd soft, nontender, nondistended, bowel sound present CNS: Alert, awake, oriented x3. No alcohol-related tremors. Psychiatry: Mood appropriate Extremities: No pedal edema, no calf tenderness. Left upper extremity in cast.  Data Review: I have personally reviewed the laboratory data and studies available.  Recent Labs  Lab 07/22/19 0838 07/23/19 0246 07/24/19 0356 07/25/19 0425 07/26/19 0451  WBC 5.3 7.1 10.6* 8.3 7.4  NEUTROABS  --  5.4  --   --   --   HGB 12.6* 11.9* 10.8* 9.8* 9.8*  HCT 37.9* 35.0* 32.3* 29.5* 29.4*  MCV 103.0* 98.6 100.3* 101.0* 101.7*  PLT 54* 56* 58* 56* 63*   Recent Labs  Lab 07/21/19 2213 07/22/19 0838 07/23/19 0246 07/24/19 0356 07/25/19 0425  NA 134* 130* 135 130* 135  K 3.4* 4.7 4.1 4.2  4.2  CL 109 102 104 100 103  CO2 20* 23 25 23 25   GLUCOSE 92 90 104* 148* 115*  BUN 16 15 15 15 19   CREATININE 0.55* 0.65 0.72 0.89 0.70  CALCIUM 7.5* 8.7* 9.1 8.6* 8.7*    [name], MD  [facility] 07/26/2019

## 2019-07-26 NOTE — Plan of Care (Signed)

## 2019-07-27 NOTE — Progress Notes (Signed)
PROGRESS NOTE  [name]  DOB: 06-Mar-1966  PCP: Biagio Borg, MD [id number]  DOA: 07/21/2019  LOS: 5 days   Chief Complaint  Patient presents with  . Seizures  . Injuries from seizure   Brief narrative: Patient is a 53 y.o. 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 He presented to the ED on 11/16 with seizure leading to fall and fractures.  Patient states that he is compliant to [place] at home. He tried CBD oil for pain on 11/15. He thinks that might have led him to have seizures on 11/16. She was found in a postictal state and complained of pain in left wrist and left hip area. Patient denies taking any alcohol since January 2020. Work-up in the ED showed comminuted and dorsally impacted distal left radius fracture, mildly comminuted and displaced ulnar styloid fracture and also a mildly impacted left femoral neck fracture.  Subjective: Patient was seen and examined this morning. Lying down in bed.  Not in distress.  Pain controlled. No new symptoms.  Feels ready for discharge to SNF whenever bed is available.  Assessment/Plan: Closed left hip fracture  -Status post left total hip replacement. -Pain control. -Weightbearing as tolerated. -Patient initially insisted on going home.  He has changed his mind and wants to go to SNF.  Pending authorization.  Left radial and ulnar fracture -S/p ORIF. -Platform weightbearing per orthopedics.  Ice application and elevate LUE for pain and swelling.  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 Continue all. -Also reports history of variceal bleeding. Currently no  evidence of bleeding but platelet level is chronically low, 63 today.  Currently on aspirin 81 mg twice daily for DVT prophylaxis.   History of alcohol abuse -currently in remission no evidence of alcohol withdrawal  Bipolar affective disorder - chronic currently stable  History of seizure disorder - takes Keppra at home. Reports compliance. Patient states that he tried CBD oil for pain which may have lowered seizure threshold. No seizure episode at home.  Mobility: PT eval DVT prophylaxis:  Currently on aspirin 81 mg twice daily for 6 weeks per orthopedics. Code Status:   Code Status: Full Code  Family Communication: none at bedside Expected Discharge:  Inpatient rehab denied by insurance company.  SNF recommended.  Consultants:  Orthopedics  Procedures:    Antimicrobials: Anti-infectives (From admission, onward)   Start     Dose/Rate Route Frequency Ordered Stop   07/23/19 1530  ceFAZolin (ANCEF) IVPB 2g/100 mL premix     2 g 200 mL/hr over 30 Minutes Intravenous Every 6 hours 07/23/19 1434 07/24/19 0305   07/23/19 1105  vancomycin (VANCOCIN) powder  Status:  Discontinued       As needed 07/23/19 1105 07/23/19 1300   07/23/19 0600  ceFAZolin (ANCEF) IVPB 2g/100 mL premix     2 g 200 mL/hr over 30 Minutes Intravenous On call to O.R. 07/22/19 1342 07/23/19 0958   07/22/19 1000  rifaximin (XIFAXAN) tablet 550 mg     550 mg Oral 3 times daily 07/22/19 0400        Diet Order            Diet regular Room service appropriate? Yes; Fluid consistency: Thin  Diet effective now  Infusions:  . sodium chloride Stopped (07/22/19 0240)  . sodium chloride Stopped (07/23/19 1447)  . methocarbamol (ROBAXIN) IV      Scheduled Meds: . aspirin EC  81 mg Oral BID  . docusate sodium  100 mg Oral BID  . feeding supplement (ENSURE ENLIVE)  237 mL Oral BID BM  . folic acid  1 mg Oral Daily  . furosemide  80 mg Oral BID  . lactulose  10 g Oral TID  . levETIRAcetam   1,500 mg Oral BID  . multivitamin with minerals  1 tablet Oral Daily  . oxyCODONE  10 mg Oral Q12H  . rifaximin  550 mg Oral TID  . spironolactone  200 mg Oral q morning - 10a    PRN meds: acetaminophen, alum & mag hydroxide-simeth, bisacodyl, HYDROcodone-acetaminophen, HYDROmorphone (DILAUDID) injection, LORazepam, magnesium citrate, menthol-cetylpyridinium **OR** phenol, methocarbamol **OR** methocarbamol (ROBAXIN) IV, morphine injection, ondansetron **OR** ondansetron (ZOFRAN) IV, oxyCODONE, oxyCODONE, polyethylene glycol, sodium phosphate, sorbitol   Objective: Vitals:   07/27/19 0451 07/27/19 0744  BP: (!) 118/55 104/60  Pulse: 73 70  Resp: 19 18  Temp: 98 F (36.7 C) 98.4 F (36.9 C)  SpO2: 99% 96%    Intake/Output Summary (Last 24 hours) at 07/27/2019 1303 Last data filed at 07/27/2019 1100 Gross per 24 hour  Intake 480 ml  Output 1500 ml  Net -1020 ml   Filed Weights   07/21/19 1920  Weight: 90.7 kg   Weight change:  Body mass index is 26.39 kg/m.   Physical Exam: General exam: Appears calm and comfortable.  Not in physical distress. Skin: No rashes, lesions or ulcers. HEENT: Atraumatic, normocephalic, supple neck, no obvious bleeding Lungs: Clear to auscultation bilaterally CVS: Regular rate and rhythm, no murmur GI/Abd soft, nontender, nondistended, bowel sound present CNS: Alert, awake, oriented x3. No alcohol-related tremors. Psychiatry: Mood appropriate Extremities: No pedal edema, no calf tenderness. Left upper extremity in cast.  Data Review: I have personally reviewed the laboratory data and studies available.  Recent Labs  Lab 07/22/19 0838 07/23/19 0246 07/24/19 0356 07/25/19 0425 07/26/19 0451  WBC 5.3 7.1 10.6* 8.3 7.4  NEUTROABS  --  5.4  --   --   --   HGB 12.6* 11.9* 10.8* 9.8* 9.8*  HCT 37.9* 35.0* 32.3* 29.5* 29.4*  MCV 103.0* 98.6 100.3* 101.0* 101.7*  PLT 54* 56* 58* 56* 63*   Recent Labs  Lab 07/21/19 2213 07/22/19 0838  07/23/19 0246 07/24/19 0356 07/25/19 0425  NA 134* 130* 135 130* 135  K 3.4* 4.7 4.1 4.2 4.2  CL 109 102 104 100 103  CO2 20* 23 25 23 25   GLUCOSE 92 90 104* 148* 115*  BUN 16 15 15 15 19   CREATININE 0.55* 0.65 0.72 0.89 0.70  CALCIUM 7.5* 8.7* 9.1 8.6* 8.7*    [name], MD  [facility] 07/27/2019

## 2019-07-28 LAB — SARS CORONAVIRUS 2 (TAT 6-24 HRS): SARS Coronavirus 2: NEGATIVE

## 2019-07-28 NOTE — Plan of Care (Signed)
  Problem: Education: Goal: Knowledge of General Education information will improve Description: Including pain rating scale, medication(s)/side effects and non-pharmacologic comfort measures Outcome: Progressing   Problem: Health Behavior/Discharge Planning: Goal: Ability to manage health-related needs will improve Outcome: Progressing   Problem: Clinical Measurements: Goal: Ability to maintain clinical measurements within normal limits will improve Outcome: Progressing Goal: Will remain free from infection Outcome: Progressing Goal: Respiratory complications will improve Outcome: Progressing   Problem: Activity: Goal: Risk for activity intolerance will decrease Outcome: Progressing   Problem: Coping: Goal: Level of anxiety will decrease Outcome: Progressing   Problem: Elimination: Goal: Will not experience complications related to bowel motility Outcome: Progressing   Problem: Pain Managment: Goal: General experience of comfort will improve Outcome: Progressing   Problem: Safety: Goal: Ability to remain free from injury will improve Outcome: Progressing   Problem: Skin Integrity: Goal: Risk for impaired skin integrity will decrease Outcome: Progressing

## 2019-07-28 NOTE — NC FL2 (Signed)
[place] LEVEL OF CARE SCREENING TOOL     IDENTIFICATION  Patient Name: [name] Birthdate: [birth year] Sex: male Admission Date (Current Location): 07/21/2019  [facility] and [place] Number:  Herbalist and Address:  [facility], [place] [address] [place], [place] [address]      Provider Number: [id number]  Attending Physician Name and Address:  [name], MD  Relative Name and Phone Number:  Luretha Murphy [phone]    Current Level of Care: Hospital Recommended Level of Care: [place] Prior Approval Number:    Date Approved/Denied:   PASRR Number: [id number]  Discharge Plan: SNF    Current Diagnoses: Patient Active Problem List   Diagnosis Date Noted  . Left displaced femoral neck fracture ([place])   . Closed fracture of left distal radius and ulna, initial encounter   . Closed left hip fracture ([place]) 07/22/2019  . Left wrist fracture 07/22/2019  . Rib fracture 07/22/2019  . Ascites 04/16/2019  . Displaced fracture of distal end of right radius 12/23/2016  . Seizure ([place]) 10/29/2016  . Possible exposure to STD 06/07/2016  . S/P total knee replacement using cement 07/15/2015  . Chest pain 04/30/2015  . Left inguinal hernia 10/22/2014  . Hyponatremia 05/06/2014  . Diarrhea 04/30/2014  . UTI (urinary tract infection) 07/29/2013  . Tibial plateau fracture, tibial shaft fracture 07/24/2013  . Erectile dysfunction 01/07/2013  . Hypokalemia 10/21/2011  . Eczema 10/21/2011  . Impaired glucose tolerance 10/19/2011  . Hepatic encephalopathy ([place]) 08/05/2011  . Depression   . Seizures ([place])   . Wilson disease   . Encephalopathy   . Thrombocytopenia ([place])   . Chronic liver disease and cirrhosis ([place])   . History of alcohol abuse   . Preventative health care 04/14/2011  . Bipolar affective disorder ([place])     Orientation RESPIRATION BLADDER Height & Weight     Self, Time, Situation,  Place  Normal Continent Weight: 200 lb (90.7 kg) Height:  6\' 1"  (185.4 cm)  BEHAVIORAL SYMPTOMS/MOOD NEUROLOGICAL BOWEL NUTRITION STATUS    Convulsions/Seizures Continent Diet  AMBULATORY STATUS COMMUNICATION OF NEEDS Skin   Limited Assist Verbally Normal, Surgical wounds                       Personal Care Assistance Level of Assistance  Bathing, Dressing, Feeding Bathing Assistance: Maximum assistance Feeding assistance: Limited assistance Dressing Assistance: Limited assistance     Functional Limitations Info    Sight Info: Adequate Hearing Info: Adequate Speech Info: Adequate    SPECIAL CARE FACTORS FREQUENCY  PT (By licensed PT), OT (By licensed OT)     PT Frequency: five times a week OT Frequency: Five times a week            Contractures Contractures Info: Not present    Additional Factors Info  Code Status, Allergies Code Status Info: full code Allergies Info: Codeine           Current Medications (07/28/2019):  This is the current hospital active medication list Current Facility-Administered Medications  Medication Dose Route Frequency Provider Last Rate Last Dose  . 0.9 %  sodium chloride infusion   Intravenous Continuous Leandrew Koyanagi, MD   Stopped at 07/22/19 0240  . 0.9 %  sodium chloride infusion   Intravenous Continuous Leandrew Koyanagi, MD   Stopped at 07/23/19 1447  . acetaminophen (TYLENOL) tablet 325-650 mg  325-650 mg Oral Q6H  PRN Leandrew Koyanagi, MD      . alum & mag hydroxide-simeth (MAALOX/MYLANTA) 200-200-20 MG/5ML suspension 30 mL  30 mL Oral Q4H PRN Leandrew Koyanagi, MD      . aspirin EC tablet 81 mg  81 mg Oral BID [name], MD   81 mg at 07/28/19 0900  . bisacodyl (DULCOLAX) suppository 10 mg  10 mg Rectal Daily PRN Leandrew Koyanagi, MD      . docusate sodium (COLACE) capsule 100 mg  100 mg Oral BID Leandrew Koyanagi, MD   100 mg at 07/28/19 1007  . feeding supplement (ENSURE ENLIVE) (ENSURE ENLIVE) liquid 237 mL  237 mL Oral BID BM Leandrew Koyanagi, MD   237 mL at 07/28/19 1014  . folic acid (FOLVITE) tablet 1 mg  1 mg Oral Daily Leandrew Koyanagi, MD   1 mg at 07/28/19 1008  . furosemide (LASIX) tablet 80 mg  80 mg Oral BID Leandrew Koyanagi, MD   80 mg at 07/28/19 0900  . HYDROcodone-acetaminophen (NORCO/VICODIN) 5-325 MG per tablet 1-2 tablet  1-2 tablet Oral Q6H PRN Leandrew Koyanagi, MD   1 tablet at 07/24/19 0231  . HYDROmorphone (DILAUDID) injection 0.5-1 mg  0.5-1 mg Intravenous Q4H PRN Leandrew Koyanagi, MD      . lactulose (CHRONULAC) 10 GM/15ML solution 10 g  10 g Oral TID Leandrew Koyanagi, MD   10 g at 07/28/19 1007  . levETIRAcetam (KEPPRA) tablet 1,500 mg  1,500 mg Oral BID Karren Cobble, RPH   1,500 mg at 07/28/19 1007  . LORazepam (ATIVAN) injection 1 mg  1 mg Intravenous Q4H PRN Leandrew Koyanagi, MD      . magnesium citrate solution 1 Bottle  1 Bottle Oral Once PRN Leandrew Koyanagi, MD      . menthol-cetylpyridinium (CEPACOL) lozenge 3 mg  1 lozenge Oral PRN Leandrew Koyanagi, MD       Or  . phenol (CHLORASEPTIC) mouth spray 1 spray  1 spray Mouth/Throat PRN Leandrew Koyanagi, MD      . methocarbamol (ROBAXIN) tablet 500 mg  500 mg Oral Q6H PRN Leandrew Koyanagi, MD   500 mg at 07/23/19 2126   Or  . methocarbamol (ROBAXIN) 500 mg in dextrose 5 % 50 mL IVPB  500 mg Intravenous Q6H PRN Leandrew Koyanagi, MD      . morphine 2 MG/ML injection 0.5 mg  0.5 mg Intravenous Q2H PRN Leandrew Koyanagi, MD   0.5 mg at 07/22/19 2100  . multivitamin with minerals tablet 1 tablet  1 tablet Oral Daily Leandrew Koyanagi, MD   1 tablet at 07/28/19 1008  . ondansetron (ZOFRAN) tablet 4 mg  4 mg Oral Q6H PRN Leandrew Koyanagi, MD       Or  . ondansetron [facility]) injection 4 mg  4 mg Intravenous Q6H PRN Leandrew Koyanagi, MD      . oxyCODONE (Oxy IR/ROXICODONE) immediate release tablet 10-15 mg  10-15 mg Oral Q4H PRN Leandrew Koyanagi, MD      . oxyCODONE (Oxy IR/ROXICODONE) immediate release tablet 5-10 mg  5-10 mg Oral Q4H PRN Leandrew Koyanagi, MD      . oxyCODONE (OXYCONTIN) 12 hr  tablet 10 mg  10 mg Oral Q12H Leandrew Koyanagi, MD   10 mg at 07/28/19 1008  . polyethylene glycol (MIRALAX / GLYCOLAX) packet 17 g  17 g Oral Daily PRN Erlinda Hong, [place]  M, MD      . rifaximin Doreene Nest) tablet 550 mg  550 mg Oral TID Leandrew Koyanagi, MD   550 mg at 07/28/19 1007  . sodium phosphate (FLEET) 7-19 GM/118ML enema 1 enema  1 enema Rectal Once PRN Leandrew Koyanagi, MD      . sorbitol 70 % solution 30 mL  30 mL Oral Daily PRN Leandrew Koyanagi, MD      . spironolactone (ALDACTONE) tablet 200 mg  200 mg Oral q morning - 10a Leandrew Koyanagi, MD   200 mg at 07/28/19 1007     Discharge Medications: Please see discharge summary for a list of discharge medications.  Relevant Imaging Results:  Relevant Lab Results:   Additional Information SSI [id number] ,   Patient will require 30 days of skilled services.   Atilano Median, LCSW

## 2019-07-28 NOTE — Progress Notes (Signed)
PROGRESS NOTE  [name]  DOB: Jan 03, 1966  PCP: Biagio Borg, MD [id number]  DOA: 07/21/2019  LOS: 6 days   Chief Complaint  Patient presents with  . Seizures  . Injuries from seizure   Brief narrative: Patient is a 53 y.o. 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 He presented to the ED on 11/16 with seizure leading to fall and fractures.  Patient states that he is compliant to [place] at home. He tried CBD oil for pain on 11/15. He thinks that might have led him to have seizures on 11/16. She was found in a postictal state and complained of pain in left wrist and left hip area. Patient denies taking any alcohol since January 2020. Work-up in the ED showed comminuted and dorsally impacted distal left radius fracture, mildly comminuted and displaced ulnar styloid fracture and also a mildly impacted left femoral neck fracture.  Subjective: Patient was seen and examined this morning. No new changes.  Not in distress. Feels ready for discharge to SNF whenever bed is available.  Assessment/Plan: Closed left hip fracture  -Status post left total hip replacement. -Pain control. -Weightbearing as tolerated. -Patient initially insisted on going home.  He has changed his mind and wants to go to SNF.  Pending authorization.  Left radial and ulnar fracture -S/p ORIF. -Platform weightbearing per orthopedics.  Ice application and elevate LUE for pain and swelling.  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 Continue all. -Also reports history of variceal bleeding. Currently no evidence of bleeding but platelet level is  chronically low, 63 today.  Currently on aspirin 81 mg twice daily for DVT prophylaxis.   History of alcohol abuse -currently in remission no evidence of alcohol withdrawal  Bipolar affective disorder - chronic currently stable  History of seizure disorder - takes Keppra at home. Reports compliance. Patient states that he tried CBD oil for pain which may have lowered seizure threshold. No seizure episode at home.  Mobility: PT eval DVT prophylaxis:  Currently on aspirin 81 mg twice daily for 6 weeks per orthopedics. Code Status:   Code Status: Full Code  Family Communication: none at bedside Expected Discharge:  Inpatient rehab denied by insurance company.  SNF recommended.  Consultants:  Orthopedics  Procedures:    Antimicrobials: Anti-infectives (From admission, onward)   Start     Dose/Rate Route Frequency Ordered Stop   07/23/19 1530  ceFAZolin (ANCEF) IVPB 2g/100 mL premix     2 g 200 mL/hr over 30 Minutes Intravenous Every 6 hours 07/23/19 1434 07/24/19 0305   07/23/19 1105  vancomycin (VANCOCIN) powder  Status:  Discontinued       As needed 07/23/19 1105 07/23/19 1300   07/23/19 0600  ceFAZolin (ANCEF) IVPB 2g/100 mL premix     2 g 200 mL/hr over 30 Minutes Intravenous On call to O.R. 07/22/19 1342 07/23/19 0958   07/22/19 1000  rifaximin (XIFAXAN) tablet 550 mg     550 mg Oral 3 times daily 07/22/19 0400        Diet Order            Diet regular Room service appropriate? Yes; Fluid consistency: Thin  Diet effective now  Infusions:  . sodium chloride Stopped (07/22/19 0240)  . sodium chloride Stopped (07/23/19 1447)  . methocarbamol (ROBAXIN) IV      Scheduled Meds: . aspirin EC  81 mg Oral BID  . docusate sodium  100 mg Oral BID  . feeding supplement (ENSURE ENLIVE)  237 mL Oral BID BM  . folic acid  1 mg Oral Daily  . furosemide  80 mg Oral BID  . lactulose  10 g Oral TID  . levETIRAcetam  1,500 mg Oral BID  . multivitamin with  minerals  1 tablet Oral Daily  . oxyCODONE  10 mg Oral Q12H  . rifaximin  550 mg Oral TID  . spironolactone  200 mg Oral q morning - 10a    PRN meds: acetaminophen, alum & mag hydroxide-simeth, bisacodyl, HYDROcodone-acetaminophen, HYDROmorphone (DILAUDID) injection, LORazepam, magnesium citrate, menthol-cetylpyridinium **OR** phenol, methocarbamol **OR** methocarbamol (ROBAXIN) IV, morphine injection, ondansetron **OR** ondansetron (ZOFRAN) IV, oxyCODONE, oxyCODONE, polyethylene glycol, sodium phosphate, sorbitol   Objective: Vitals:   07/28/19 0342 07/28/19 0855  BP: (!) 121/55 96/73  Pulse: 74 73  Resp: 17 18  Temp: 97.8 F (36.6 C) 98.2 F (36.8 C)  SpO2: 99% 97%    Intake/Output Summary (Last 24 hours) at 07/28/2019 1452 Last data filed at 07/28/2019 0940 Gross per 24 hour  Intake 960 ml  Output 1700 ml  Net -740 ml   Filed Weights   07/21/19 1920  Weight: 90.7 kg   Weight change:  Body mass index is 26.39 kg/m.   Physical Exam: General exam: Appears calm and comfortable.  Not in physical distress. Skin: No rashes, lesions or ulcers. HEENT: Atraumatic, normocephalic, supple neck, no obvious bleeding Lungs: Clear to auscultation bilaterally.  No wheezing, no crackles. CVS: Regular rate and rhythm, no murmur GI/Abd soft, nontender, nondistended, bowel sound present CNS: Alert, awake, oriented x3. No alcohol-related tremors. Psychiatry: Mood appropriate Extremities: No pedal edema, no calf tenderness. Left upper extremity in cast.  Data Review: I have personally reviewed the laboratory data and studies available.  Recent Labs  Lab 07/22/19 0838 07/23/19 0246 07/24/19 0356 07/25/19 0425 07/26/19 0451  WBC 5.3 7.1 10.6* 8.3 7.4  NEUTROABS  --  5.4  --   --   --   HGB 12.6* 11.9* 10.8* 9.8* 9.8*  HCT 37.9* 35.0* 32.3* 29.5* 29.4*  MCV 103.0* 98.6 100.3* 101.0* 101.7*  PLT 54* 56* 58* 56* 63*   Recent Labs  Lab 07/21/19 2213 07/22/19 0838 07/23/19 0246  07/24/19 0356 07/25/19 0425  NA 134* 130* 135 130* 135  K 3.4* 4.7 4.1 4.2 4.2  CL 109 102 104 100 103  CO2 20* 23 25 23 25   GLUCOSE 92 90 104* 148* 115*  BUN 16 15 15 15 19   CREATININE 0.55* 0.65 0.72 0.89 0.70  CALCIUM 7.5* 8.7* 9.1 8.6* 8.7*    [name], MD  [facility] 07/28/2019

## 2019-07-28 NOTE — Progress Notes (Signed)
Physical Therapy Treatment Patient Details Name: [name] MRN: [id number] DOB: 04-11-1966 Today's Date: 07/28/2019    History of Present Illness Patient is a 53 y.o. male with medical history significant of alcoholic liver disease complicated by Redmond Pulling disease, seizure disorder, bipolar, depression, dyslipidemiaVentral hernia. He presented to the ED on 11/16 with seizure leading to fall and L hip and wrist fractures. Pt s/p L total L hip athroplasty anterior approach and L wrist ORIF    PT Comments    Continuing work on functional mobility and activity tolerance;  Able to incr progressive amb distance, with noted improving weight acceptance LLE in stance; he agrees to SNF for continued rehab to maximize independence and safety with mobility prior to dc home; Overall progressing well; Anticipate continuing good progress at post-acute rehabilitation.    Follow Up Recommendations  SNF;Supervision/Assistance - 24 hour     Equipment Recommendations  Rolling walker with 5" wheels(L platform)    Recommendations for Other Services       Precautions / Restrictions Precautions Precautions: Fall Required Braces or Orthoses: Splint/Cast;Sling Splint/Cast: L wrist Restrictions LUE Weight Bearing: Weight bear through elbow only LLE Weight Bearing: Weight bearing as tolerated    Mobility  Bed Mobility Overal bed mobility: Needs Assistance Bed Mobility: Sit to Supine       Sit to supine: Min assist   General bed mobility comments: Min assist to help LEs into bed  Transfers Overall transfer level: Needs assistance Equipment used: Left platform walker Transfers: Sit to/from Stand Sit to Stand: Mod assist         General transfer comment: Light mod assist to power up; mod assist also to control descent to sit  Ambulation/Gait Ambulation/Gait assistance: Min assist Gait Distance (Feet): 50 Feet Assistive device: Left platform walker Gait Pattern/deviations:  Step-through pattern;Decreased stride length     General Gait Details: cues for sequencing, and to stand tall on LLE to encourage more quad and gluteal activation; cues to self-monitor for activity tolerance   Stairs             Wheelchair Mobility    Modified Rankin (Stroke Patients Only)       Balance     Sitting balance-Leahy Scale: Good       Standing balance-Leahy Scale: Poor Standing balance comment: reliant on external support                            Cognition Arousal/Alertness: Awake/alert Behavior During Therapy: WFL for tasks assessed/performed Overall Cognitive Status: Within Functional Limits for tasks assessed                                        Exercises      General Comments        Pertinent Vitals/Pain Pain Assessment: 0-10 Pain Score: 5  Faces Pain Scale: Hurts little more Pain Location: L LE Pain Descriptors / Indicators: Discomfort;Sore Pain Intervention(s): Monitored during session    Home Living                      Prior Function            PT Goals (current goals can now be found in the care plan section) Acute Rehab PT Goals Patient Stated Goal: independence PT Goal Formulation: With patient Time For Goal Achievement: 08/07/19 Potential  to Achieve Goals: Good Progress towards PT goals: Progressing toward goals    Frequency    Min 3X/week      PT Plan Discharge plan needs to be updated;Frequency needs to be updated    Co-evaluation              AM-PAC PT "6 Clicks" Mobility   Outcome Measure  Help needed turning from your back to your side while in a flat bed without using bedrails?: A Little Help needed moving from lying on your back to sitting on the side of a flat bed without using bedrails?: A Lot Help needed moving to and from a bed to a chair (including a wheelchair)?: A Lot Help needed standing up from a chair using your arms (e.g., wheelchair or bedside  chair)?: A Lot Help needed to walk in hospital room?: A Lot Help needed climbing 3-5 steps with a railing? : Total 6 Click Score: 12    End of Session Equipment Utilized During Treatment: Gait belt Activity Tolerance: Patient tolerated treatment well Patient left: in bed;with call bell/phone within reach;with nursing/sitter in room Nurse Communication: Mobility status PT Visit Diagnosis: Other abnormalities of gait and mobility (R26.89);Difficulty in walking, not elsewhere classified (R26.2);Pain Pain - Right/Left: Left Pain - part of body: Hip     Time: [id number] PT Time Calculation (min) (ACUTE ONLY): 27 min  Charges:  $Gait Training: 23-37 mins                     Roney Marion, [place]  Acute Rehabilitation Services Pager [phone] Office [place] 07/28/2019, 6:58 PM

## 2019-07-28 NOTE — Progress Notes (Signed)
Occupational Therapy Treatment Patient Details Name: [name] MRN: [id number] DOB: [birth year] Today's Date: 07/28/2019    History of present illness Patient is a 53 y.o. male with medical history significant of alcoholic liver disease complicated by Redmond Pulling disease, seizure disorder, bipolar, depression, dyslipidemiaVentral hernia. He presented to the ED on 11/16 with seizure leading to fall and L hip and wrist fractures. Pt s/p L total L hip athroplasty anterior approach and L wrist ORIF   OT comments  Pt making good progress with with functional goals. Pt required min A with bed mobiliyt to sit EOB. Pt participated in UB bathing and dressing min A and LB bathing max A sitting EOB. Pt stood at [facility] for therapist to assist pt with LB bathing and perihygiene. Pt stood form EOB to PFRW mod A and SPT min A to BSC and to recliner. OT will continue to follow acutely  Follow Up Recommendations  SNF    Equipment Recommendations  3 in 1 bedside commode;Other (comment)(TBD at next venue of care)    Recommendations for Other Services      Precautions / Restrictions Precautions Precautions: Fall Required Braces or Orthoses: Splint/Cast;Sling Splint/Cast: L wrist Restrictions Weight Bearing Restrictions: Yes LUE Weight Bearing: Weight bear through elbow only LLE Weight Bearing: Weight bearing as tolerated       Mobility Bed Mobility Overal bed mobility: Needs Assistance Bed Mobility: Supine to Sit     Supine to sit: Min assist     General bed mobility comments: +rail, increased time, cues for sequencing, use of bed pad to scoot to EOB  Transfers Overall transfer level: Needs assistance Equipment used: Left platform walker Transfers: Sit to/from Stand Sit to Stand: Mod assist Stand pivot transfers: Min assist       General transfer comment: mod A to power up for sit - stand from EOB to PFRW, min A SPT    Balance Overall balance assessment: Needs  assistance Sitting-balance support: No upper extremity supported;Feet supported Sitting balance-Leahy Scale: Good     Standing balance support: Bilateral upper extremity supported;During functional activity Standing balance-Leahy Scale: Poor                             ADL either performed or assessed with clinical judgement   ADL Overall ADL's : Needs assistance/impaired     Grooming: Wash/dry hands;Wash/dry face;Set up;Sitting   Upper Body Bathing: Minimal assistance;Sitting   Lower Body Bathing: Maximal assistance;Moderate assistance;Sitting/lateral leans   Upper Body Dressing : Minimal assistance;Sitting   Lower Body Dressing: Total assistance;Sitting/lateral leans   Toilet Transfer: Moderate assistance;Minimal assistance;Cueing for safety;Ambulation;RW;Stand-pivot   Toileting- Water quality scientist and Hygiene: Total assistance;Sit to/from stand       Functional mobility during ADLs: Moderate assistance;Minimal assistance;Cueing for safety;Rolling walker General ADL Comments: pt participated in UB bathing and dressing min A and LB bathing max A sitting EOB. Pt stood at [facility] for therapist to assist pt with LB bathing and perihygiene     Vision Patient Visual Report: No change from baseline     Perception     Praxis      Cognition Arousal/Alertness: Awake/alert Behavior During Therapy: WFL for tasks assessed/performed Overall Cognitive Status: Within Functional Limits for tasks assessed  Exercises     Shoulder Instructions       General Comments      Pertinent Vitals/ Pain       Pain Assessment: 0-10 Pain Score: 4  Pain Location: L LE Pain Descriptors / Indicators: Discomfort;Sore Pain Intervention(s): Monitored during session;Repositioned  Home Living                                          Prior Functioning/Environment              Frequency  Min 2X/week         Progress Toward Goals  OT Goals(current goals can now be found in the care plan section)  Progress towards OT goals: Progressing toward goals     Plan Discharge plan needs to be updated    Co-evaluation                 AM-PAC OT "6 Clicks" Daily Activity     Outcome Measure   Help from another person eating meals?: A Little Help from another person taking care of personal grooming?: A Little Help from another person toileting, which includes using toliet, bedpan, or urinal?: A Lot Help from another person bathing (including washing, rinsing, drying)?: A Lot Help from another person to put on and taking off regular upper body clothing?: A Little Help from another person to put on and taking off regular lower body clothing?: Total 6 Click Score: 14    End of Session Equipment Utilized During Treatment: Gait belt;Rolling walker;Other (comment)(BSC)  OT Visit Diagnosis: Unsteadiness on feet (R26.81);Other abnormalities of gait and mobility (R26.89);Muscle weakness (generalized) (M62.81);History of falling (Z91.81);Pain Pain - Right/Left: Left Pain - part of body: Hip;Leg;Ankle and joints of foot   Activity Tolerance Patient tolerated treatment well   Patient Left in chair;with call bell/phone within reach;with nursing/sitter in room   Nurse Communication          Time: [id number] OT Time Calculation (min): 25 min  Charges: OT General Charges $OT Visit: 1 Visit OT Treatments $Self Care/Home Management : 8-22 mins $Therapeutic Activity: 8-22 mins     Britt Bottom 07/28/2019, 2:25 PM

## 2019-07-28 NOTE — Progress Notes (Signed)
Insurance Authorization Information  Auth ID # Still waiting to generate  [place] Reference ID # [id number]  Start of Care 11/21  Next review date 11/25  Care coordinator Cristobal Goldmann  Send clinical information to [phone]

## 2019-07-29 MED ORDER — ENSURE ENLIVE PO LIQD: 237.0000 mL | Freq: Two times a day (BID) | ORAL | 12 refills | Status: DC

## 2019-07-29 NOTE — Progress Notes (Signed)
Nutrition Follow-up  RD working remotely.  DOCUMENTATION CODES:   Not applicable  INTERVENTION:   -Continue MVI with minerals daily -Continue Ensure Enlive po BID, each supplement provides 350 kcal and 20 grams of protein  NUTRITION DIAGNOSIS:   Increased nutrient needs related to hip fracture as evidenced by estimated needs.  Ongoing  GOAL:   Patient will meet greater than or equal to 90% of their needs  Progressing   MONITOR:   PO intake, Supplement acceptance, Labs, Weight trends  REASON FOR ASSESSMENT:   Malnutrition Screening Tool    ASSESSMENT:   53 year old male who presented to the ED on 11/16 after having a seizure. PMH of cirrhosis, seizures, EtOH abuse, Wilson's disease, hepatocellular carcinoma s/p gamma knife surgical ablation, bipolar, depression. Pt found to have left femoral neck fracture and left wrist fracture.  11/18- s/p Total hip replacement; Left hip; uncemented [id number]; Open reduction and internal fixation, Left CPT [phone] extraarticular  Reviewed I/O's: +990 ml x 24 hours and -4 L since admission  UOP: 450 ml x 24 hours  Pt with good appetite; noted meal completion 75-100%. Pt is also consuming Ensure supplements per MAR.   Medications reviewed and include lasix, lactulose, and MVI.  Pt awaiting SNF placement for discharge.   Labs reviewed.   Diet Order:   Diet Order            Diet regular Room service appropriate? Yes; Fluid consistency: Thin  Diet effective now              EDUCATION NEEDS:   No education needs have been identified at this time  Skin:  Skin Assessment: Skin Integrity Issues: Skin Integrity Issues:: Incisions Incisions: rt leg, lt wrist, lt hip  Last BM:  07/26/19  Height:   Ht Readings from Last 1 Encounters:  07/21/19 6\' 1"  (1.854 m)    Weight:   Wt Readings from Last 1 Encounters:  07/21/19 90.7 kg    Ideal Body Weight:  83.6 kg  BMI:  Body mass index is 26.39 kg/m.  Estimated  Nutritional Needs:   Kcal:  2200-2400  Protein:  105-125 grams  Fluid:  >/= 2.0 L    Finnbar Cedillos A. Jimmye Norman, RD, LDN, [place] Registered Dietitian II Certified Diabetes Care and Education Specialist Pager: [phone] After hours Pager: [phone]

## 2019-07-29 NOTE — Discharge Summary (Signed)
Physician Discharge Summary  [name] [id number] DOB: [birth year] DOA: 07/21/2019  PCP: Joel Borg, MD  Admit date: 07/21/2019 Discharge date: 07/29/2019  Admitted From: Home Discharge disposition: SNF   Code Status: Full Code  Diet Recommendation: Low-sodium diet   Recommendations for Outpatient Follow-Up:   1. Follow-up with orthopedics as an outpatient  Discharge Diagnosis:   Active Problems:   Bipolar affective disorder ([place])   Seizures ([place])   Wilson disease   Thrombocytopenia ([place])   History of alcohol abuse   Hypokalemia   Closed left hip fracture (HCC)   Left wrist fracture   Rib fracture   Left displaced femoral neck fracture (HCC)   Closed fracture of left distal radius and ulna, initial encounter  History of Present Illness / Brief narrative:  Patient is a 53 y.o. 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 He presented to the ED on 11/16 with seizure leading to fall and fractures.  Patient states that he is compliant to [place] at home. He tried CBD oil for pain on 11/15. He thinks that might have led him to have seizures on 11/16. She was found in a postictal state and complained of pain in left wrist and left hip area. Patient denies taking any alcohol since January 2020. Work-up in the ED showed comminuted and dorsally impacted distal left radius fracture, mildly comminuted and displaced ulnar styloid fracture and also a mildly impacted left femoral neck fracture.  Hospital Course:  Closed left hip fracture  -Status post left total hip replacement. -Pain control. -Weightbearing as tolerated. -Patient initially insisted on going home.  He changed his mind and wants to go to SNF.   Left radial and ulnar fracture -S/p ORIF. -Platform weightbearing per orthopedics.  Ice application and elevate LUE for pain and swelling.   Rib fracture -conservative measurescontrol incentive spirometry  Liver cirrhosis secondary to alcohol use and Wilson's disease Chronic thrombocytopenia -Follows up with hepatologist at [facility] meds include Lasix 80 mg twice daily, potassium replacement, Aldactone, Xifaxan. Continue all. -Also reports history of variceal bleeding. Currently no evidence of bleeding but platelet level is chronically low, 63 today. Currently on aspirin 81 mg twice daily for DVT prophylaxis.   History of alcohol abuse - currently in remission no evidence of alcohol withdrawal.  Bipolar affective disorder -chronic currently stable  History of seizure disorder- takes Keppra at home. Reports compliance. Patient states that he tried CBD oil for pain which may have lowered seizure threshold. No seizure episode at home.  Stable for discharge to SNF today.  Subjective:  Seen and examined this morning.  Lying down in bed.  Not in distress.  Ready to go to SNF today.  Discharge Exam:   Vitals:   07/28/19 1625 07/28/19 1928 07/29/19 0251 07/29/19 0851  BP: (!) 120/55 (!) 122/56 111/66 (!) 109/57  Pulse: 71 79 78 70  Resp: 18   17  Temp: 98.4 F (36.9 C)  98.5 F (36.9 C) 98.1 F (36.7 C)  TempSrc: Oral  Oral Oral  SpO2: 99% 100% 97% 97%  Weight:      Height:        Body mass index is 26.39 kg/m.  General exam: Appears calm and comfortable.  Skin: No rashes, lesions or ulcers. HEENT: Atraumatic, normocephalic, supple neck, no obvious bleeding Lungs: Clear to auscultation bilaterally CVS: Regular rate and rhythm, no murmur GI/Abd soft, nondistended, nontender, bowel  sound present CNS: Alert, awake, oriented x3 Psychiatry: Mood appropriate Extremities: No pedal edema, no calf tenderness  Discharge Instructions:  Wound care: None Discharge Instructions    Diet - low sodium heart healthy   Complete by: As directed    Increase activity slowly   Complete by: As directed       Contact  information for follow-up providers    Leandrew Koyanagi, MD In 2 weeks.   Specialty: Orthopedic Surgery Why: For suture removal, For wound re-check Contact information: [place] [address] [phone]            Contact information for after-discharge care    [facility] .   Service: Skilled Nursing Contact information: [place] [phone]                 Allergies as of 07/29/2019      Reactions   Codeine Nausea And Vomiting, Other (See Comments)   Reports it makes me crazy.      Medication List    STOP taking these medications   cyclobenzaprine 5 MG tablet Commonly known as: FLEXERIL     TAKE these medications   aspirin EC 81 MG tablet Take one tablet bid x 6 weeks to prevent blood clots   feeding supplement (ENSURE ENLIVE) Liqd Take 237 mLs by mouth 2 (two) times daily between meals.   folic acid 1 MG tablet Commonly known as: FOLVITE Take 1 tablet (1 mg total) by mouth daily. Take 1 mg by mouth daily.   furosemide 40 MG tablet Commonly known as: Lasix Take 2 tablets (80 mg total) by mouth 2 (two) times daily.   levETIRAcetam 1000 MG tablet Commonly known as: KEPPRA Take 1 tablet (1,000 mg total) by mouth 2 (two) times daily. What changed: how much to take   Melatonin 3 MG Tabs Take 3 mg by mouth at bedtime.   oxyCODONE 5 MG immediate release tablet Commonly known as: Oxy IR/ROXICODONE Take 1-2 tablets (5-10 mg total) by mouth every 6 (six) hours as needed for moderate pain (pain score 4-6).   potassium chloride 10 MEQ tablet Commonly known as: KLOR-CON Take 1 tablet (10 mEq total) by mouth 2 (two) times daily.   POTASSIUM CITRATE PO Take 200 mg by mouth daily.   spironolactone 100 MG tablet Commonly known as: ALDACTONE Take 2 tablets (200 mg total) by mouth every morning.   tadalafil 5 MG tablet Commonly known as: CIALIS Take 5 mg by mouth  daily as needed for erectile dysfunction.   Xifaxan 550 MG Tabs tablet Generic drug: rifaximin Take 1 tablet (550 mg total) by mouth 3 (three) times daily. Take 550 mg by mouth 3 (three) times daily.   zinc gluconate 50 MG tablet Take 50 mg by mouth 3 (three) times daily.            Durable Medical Equipment  (From admission, onward)         Start     Ordered   07/25/19 1100  For home use only DME 3 n 1  Once     07/25/19 1059   07/25/19 1100  For home use only DME Tub bench  Once     07/25/19 1059   07/25/19 1059  For home use only DME Walker rolling  Once    Comments: TOTAL HIP ARTHROPLASTY ANTERIOR APPROACH (Left) OPEN REDUCTION INTERNAL FIXATION (ORIF) WRIST FRACTURE (Left)  Left  platform  Question:  Patient needs a walker to treat with the following condition  Answer:  Weakness   07/25/19 1059          Time coordinating discharge: 35 minutes  The results of significant diagnostics from this hospitalization (including imaging, microbiology, ancillary and laboratory) are listed below for reference.    Procedures and Diagnostic Studies:   Xr Chest Preop 1 View  Result Date: 07/21/2019 CLINICAL DATA:  53 year old male status post seizure and fall at home. EXAM: CHEST  1 VIEW COMPARISON:  Chest radiographs 04/30/2015 and earlier. FINDINGS: Portable AP supine view at 2121 hours. Externals zipper artifact suspected. Lung volumes and mediastinal contours remain normal. Visualized tracheal air column is within normal limits. Allowing for portable technique the lungs are clear. Subacute appearing left lateral 9th rib fracture with some periosteal new bone formation. No superimposed No acute osseous abnormality identified. IMPRESSION: 1. Subacute appearing left 9th rib fracture. 2. No acute cardiopulmonary abnormality. Electronically Signed   By: Genevie Ann M.D.   On: 07/21/2019 21:47   Xr Wrist L  Result Date: 07/21/2019 CLINICAL DATA:  53 year old male status post seizure  and fall. Wrist deformity. EXAM: LEFT WRIST - COMPLETE 3+ VIEW COMPARISON:  None. FINDINGS: Comminuted and dorsally impacted distal left radius fracture. DRU involvement. Mild radial displacement also. No definite radiocarpal involvement. Mildly comminuted and displaced ulnar styloid fracture. Carpal bone alignment within normal limits. Visible metacarpals intact. IMPRESSION: 1. Comminuted and dorsally impacted distal left radius fracture. 2. Ulnar styloid fracture. Electronically Signed   By: Genevie Ann M.D.   On: 07/21/2019 21:45   Ct Head Wo Contrast  Result Date: 07/21/2019 CLINICAL DATA:  53 year old male status post seizure and fall at home. Found down postictal. EXAM: CT HEAD WITHOUT CONTRAST TECHNIQUE: Contiguous axial images were obtained from the base of the skull through the vertex without intravenous contrast. COMPARISON:  Head CT 08/05/2011 and earlier. FINDINGS: Brain: Stable cerebral volume since 2012, chronic brainstem volume loss. No midline shift, ventriculomegaly, mass effect, evidence of mass lesion, intracranial hemorrhage or evidence of cortically based acute infarction. Gray-white matter differentiation is within normal limits throughout the brain. Vascular: No suspicious intracranial vascular hyperdensity. Skull: Stable, intact. Sinuses/Orbits: Visualized paranasal sinuses and mastoids are stable and well pneumatized. Other: Visualized orbits and scalp soft tissues are within normal limits. IMPRESSION: Stable since 2012. No acute intracranial abnormality or acute traumatic injury identified. Electronically Signed   By: Genevie Ann M.D.   On: 07/21/2019 21:51   Ct Cervical Spine Wo Contrast  Result Date: 07/21/2019 CLINICAL DATA:  53 year old male status post seizure and fall at home. Found down postictal. EXAM: CT CERVICAL SPINE WITHOUT CONTRAST TECHNIQUE: Multidetector CT imaging of the cervical spine was performed without intravenous contrast. Multiplanar CT image reconstructions were  also generated. COMPARISON:  Head CT today reported separately. CT cervical spine 10/29/2016. FINDINGS: Alignment: Improved cervical lordosis since 2018. Mild levoconvex scoliosis. Cervicothoracic junction alignment is within normal limits. Bilateral posterior element alignment is within normal limits. Skull base and vertebrae: Visualized skull base is intact. No atlanto-occipital dissociation. No acute osseous abnormality identified. Soft tissues and spinal canal: No prevertebral fluid or swelling. No visible canal hematoma. Negative noncontrast neck soft tissues. Disc levels: Intermittent chronic disc and endplate degeneration, but capacious cervical spinal canal with no spinal stenosis suspected. Upper chest: Visible upper thoracic levels appear intact. Mild atelectasis in the lung apices. Trace retained secretions in the trachea on series 6, image 82. IMPRESSION: 1. No acute  traumatic injury identified in the cervical spine. 2. Trace retained secretions in the trachea at the thoracic inlet. Electronically Signed   By: Genevie Ann M.D.   On: 07/21/2019 22:01   Dg Ankle Left Port  Result Date: 07/22/2019 CLINICAL DATA:  Fall, lateral pain and swelling. EXAM: PORTABLE LEFT ANKLE - 2 VIEW COMPARISON:  None. FINDINGS: There is no evidence of fracture, dislocation, or joint effusion. There is no evidence of arthropathy or other focal bone abnormality. Soft tissues are unremarkable. IMPRESSION: Negative. Electronically Signed   By: Rolm Baptise M.D.   On: 07/22/2019 01:30   Xr Hip Left  Result Date: 07/21/2019 CLINICAL DATA:  53 year old male status post seizure and fall. Hip deformity. EXAM: DG HIP (WITH OR WITHOUT PELVIS) 2-3V LEFT COMPARISON:  CT Abdomen and Pelvis 12/24/2014. FINDINGS: Impacted left femoral neck fracture, possibly subcapital. This appears minimally displaced aside from the mild impaction. Left femoral head remains normally located. The intertrochanteric segment appears to remain intact. Hip  joint spaces are symmetric and normal for age. Grossly intact proximal right femur. Pelvis appears intact. Negative visible bowel gas pattern. IMPRESSION: Mildly impacted left femoral neck fracture. Electronically Signed   By: Genevie Ann M.D.   On: 07/21/2019 21:44     Labs:   Basic Metabolic Panel: Recent Labs  Lab 07/23/19 0246 07/24/19 0356 07/25/19 0425  NA 135 130* 135  K 4.1 4.2 4.2  CL 104 100 103  CO2 25 23 25   GLUCOSE 104* 148* 115*  BUN 15 15 19   CREATININE 0.72 0.89 0.70  CALCIUM 9.1 8.6* 8.7*   GFR Estimated Creatinine Clearance: 117.9 mL/min (by C-G formula based on SCr of 0.7 mg/dL). Liver Function Tests: Recent Labs  Lab 07/23/19 0246  AST 53*  ALT 49*  ALKPHOS 147*  BILITOT 2.8*  PROT 5.6*  ALBUMIN 2.3*   No results for input(s): LIPASE, AMYLASE in the last 168 hours. Recent Labs  Lab 07/23/19 0246  AMMONIA 70*   Coagulation profile No results for input(s): INR, PROTIME in the last 168 hours.  CBC: Recent Labs  Lab 07/23/19 0246 07/24/19 0356 07/25/19 0425 07/26/19 0451  WBC 7.1 10.6* 8.3 7.4  NEUTROABS 5.4  --   --   --   HGB 11.9* 10.8* 9.8* 9.8*  HCT 35.0* 32.3* 29.5* 29.4*  MCV 98.6 100.3* 101.0* 101.7*  PLT 56* 58* 56* 63*   Cardiac Enzymes: No results for input(s): CKTOTAL, CKMB, CKMBINDEX, TROPONINI in the last 168 hours. BNP: Invalid input(s): POCBNP CBG: No results for input(s): GLUCAP in the last 168 hours. D-Dimer No results for input(s): DDIMER in the last 72 hours. Hgb A1c No results for input(s): HGBA1C in the last 72 hours. Lipid Profile No results for input(s): CHOL, HDL, LDLCALC, TRIG, CHOLHDL, LDLDIRECT in the last 72 hours. Thyroid function studies No results for input(s): TSH, T4TOTAL, T3FREE, THYROIDAB in the last 72 hours.  Invalid input(s): FREET3 Anemia work up No results for input(s): VITAMINB12, FOLATE, FERRITIN, TIBC, IRON, RETICCTPCT in the last 72 hours. Microbiology Recent Results (from the past 240  hour(s))  SARS CORONAVIRUS 2 (TAT 6-24 HRS) Nasopharyngeal Nasopharyngeal Swab     Status: None   Collection Time: 07/21/19 11:27 PM   Specimen: Nasopharyngeal Swab  Result Value Ref Range Status   SARS Coronavirus 2 NEGATIVE NEGATIVE Final    Comment: (NOTE) SARS-CoV-2 target nucleic acids are NOT DETECTED. The SARS-CoV-2 RNA is generally detectable in upper and lower respiratory specimens during the acute phase of  infection. Negative results do not preclude SARS-CoV-2 infection, do not rule out co-infections with other pathogens, and should not be used as the sole basis for treatment or other patient management decisions. Negative results must be combined with clinical observations, patient history, and epidemiological information. The expected result is Negative. Fact Sheet for Patients: SugarRoll.be Fact Sheet for Healthcare Providers: https://www.woods-mathews.com/ This test is not yet approved or cleared by the [place] FDA and  has been authorized for detection and/or diagnosis of SARS-CoV-2 by FDA under an Emergency Use Authorization (EUA). This EUA will remain  in effect (meaning this test can be used) for the duration of the COVID-19 declaration under Section 56 4(b)(1) of the Act, 21 U.S.C. section 360bbb-3(b)(1), unless the authorization is terminated or revoked sooner. Performed at [facility], [place] [address] [place], [place] [address]   Surgical PCR screen     Status: None   Collection Time: 07/22/19  4:11 AM   Specimen: Nasal Mucosa; Nasal Swab  Result Value Ref Range Status   MRSA, PCR NEGATIVE NEGATIVE Final   Staphylococcus aureus NEGATIVE NEGATIVE Final    Comment: (NOTE) The Xpert SA Assay (FDA approved for NASAL specimens in patients 61 years of age and older), is one component of a comprehensive surveillance program. It is not intended to diagnose infection nor to guide or monitor treatment. Performed  at [facility], [place] [address], [place], [place] [address]   SARS CORONAVIRUS 2 (TAT 6-24 HRS) Nasopharyngeal Nasopharyngeal Swab     Status: None   Collection Time: 07/28/19  3:41 PM   Specimen: Nasopharyngeal Swab  Result Value Ref Range Status   SARS Coronavirus 2 NEGATIVE NEGATIVE Final    Comment: (NOTE) SARS-CoV-2 target nucleic acids are NOT DETECTED. The SARS-CoV-2 RNA is generally detectable in upper and lower respiratory specimens during the acute phase of infection. Negative results do not preclude SARS-CoV-2 infection, do not rule out co-infections with other pathogens, and should not be used as the sole basis for treatment or other patient management decisions. Negative results must be combined with clinical observations, patient history, and epidemiological information. The expected result is Negative. Fact Sheet for Patients: SugarRoll.be Fact Sheet for Healthcare Providers: https://www.woods-mathews.com/ This test is not yet approved or cleared by the [place] FDA and  has been authorized for detection and/or diagnosis of SARS-CoV-2 by FDA under an Emergency Use Authorization (EUA). This EUA will remain  in effect (meaning this test can be used) for the duration of the COVID-19 declaration under Section 56 4(b)(1) of the Act, 21 U.S.C. section 360bbb-3(b)(1), unless the authorization is terminated or revoked sooner. Performed at [facility], [place] [address], [place], [place] [address]     Please note: You were cared for by a hospitalist during your hospital stay. Once you are discharged, your primary care physician will handle any further medical issues. Please note that NO REFILLS for any discharge medications will be authorized once you are discharged, as it is imperative that you return to your primary care physician (or establish a relationship with a primary care physician if you do not  have one) for your post hospital discharge needs so that they can reassess your need for medications and monitor your lab values.  Signed: [name]  [facility] 07/29/2019, 12:07 PM

## 2019-07-29 NOTE — Progress Notes (Signed)
A discharge packet was printed and will be sent via [facility] to [facility].  Report given to [place].  The patient has been made aware of arrangements

## 2019-07-29 NOTE — TOC Transition Note (Signed)
Transition of Care [facility]) - CM/SW Discharge Note   Patient Details  Name: [name] MRN: [id number] Date of Birth: Dec 24, 1965  Transition of Care [facility] CM/SW Contact:  Atilano Median, LCSW Phone Number: 07/29/2019, 12:21 PM   Clinical Narrative:    Discharged to SNF. Referral coordinated with Freda Munro [phone]. Patient aware and agreeable to this plan. Number to call report [phone] given to unit RN Caren Griffins. No other needs at this time. Case closed to this CSW.    Final next level of care: Skilled Nursing Facility Barriers to Discharge: Barriers Resolved   Patient Goals and CMS Choice Patient states their goals for this hospitalization and ongoing recovery are:: to go home CMS Medicare.gov Compare Post Acute Care list provided to:: Patient    Discharge Placement PASRR number recieved: 07/26/19            Patient chooses bed at: [facility] Patient to be transferred to facility by: [place] Name of family member notified: patient Patient and family notified of of transfer: 07/29/19  Discharge Plan and Services     Post Acute Care Choice: Home Health, Durable Medical Equipment                               Social Determinants of Health (SDOH) Interventions     Readmission Risk Interventions No flowsheet data found.

## 2019-07-30 ENCOUNTER — Telehealth: Payer: Self-pay | Admitting: *Deleted

## 2019-07-30 NOTE — Telephone Encounter (Signed)
Pt was on TCM report admitted 11/16 with seizure leading to fall and fractures. Work-up in the ED showed comminuted and dorsally impacted distal left radius fracture, mildly comminuted and displaced ulnar styloid fracture and also a mildly impacted left femoral neck fracture. Pt underwent left total hip replacement, and was D/C 07/29/19 to SNF. After leaving facility he will follow=up w/specialist Dr. Erlinda Hong in 2 weeks.Marland KitchenJohny Chess

## 2019-08-06 ENCOUNTER — Encounter: Payer: Self-pay | Admitting: Orthopaedic Surgery

## 2019-08-06 ENCOUNTER — Other Ambulatory Visit: Payer: Self-pay

## 2019-08-06 ENCOUNTER — Ambulatory Visit (INDEPENDENT_AMBULATORY_CARE_PROVIDER_SITE_OTHER): Payer: Medicare Other

## 2019-08-06 ENCOUNTER — Ambulatory Visit (INDEPENDENT_AMBULATORY_CARE_PROVIDER_SITE_OTHER): Payer: Medicare Other | Admitting: Orthopaedic Surgery

## 2019-08-06 VITALS — Ht 73.0 in | Wt 200.0 lb

## 2019-08-06 DIAGNOSIS — S52502A Unspecified fracture of the lower end of left radius, initial encounter for closed fracture: Secondary | ICD-10-CM | POA: Diagnosis not present

## 2019-08-06 DIAGNOSIS — S72002A Fracture of unspecified part of neck of left femur, initial encounter for closed fracture: Secondary | ICD-10-CM | POA: Diagnosis not present

## 2019-08-06 DIAGNOSIS — S52602A Unspecified fracture of lower end of left ulna, initial encounter for closed fracture: Secondary | ICD-10-CM

## 2019-08-06 NOTE — Progress Notes (Signed)
Post-Op Visit Note   Patient: [name]           Date of Birth: [birth year]           MRN: [id number] Visit Date: 08/06/2019 PCP: Joel Borg, MD   Assessment & Plan:  Chief Complaint:  Chief Complaint  Patient presents with  . Left Hip - Routine Post Op  . Left Wrist - Routine Post Op   Visit Diagnoses:  1. Left displaced femoral neck fracture (HCC)   2. Closed fracture of left distal radius and ulna, initial encounter     Plan: [name] is 2-week status post ORIF left distal radius fracture and left total hip replacement.  He is still at [name].  He is doing well overall he is making good progress.  His surgical incisions are all healed without any signs of infection.  Sutures were removed today. His x-rays demonstrate stable fixation alignment of the distal radius fracture and uncomplicated total hip placement.  Neurovascular intact. He looks good from our standpoint for his 2-week follow-up.  We placed him into a short wrist brace.  He can platform weightbearing to the left upper extremity and weight-bear as tolerated to the left lower extremity.  Continue with physical therapy.  Recheck in 4 weeks with two-view x-rays of the left wrist and hip.  Follow-Up Instructions: Return in about 4 weeks (around 09/03/2019).   Orders:  Orders Placed This Encounter  Procedures  . XR HIP UNILAT W OR W/O PELVIS 2-3 VIEWS LEFT  . XR Wrist Complete Left   No orders of the defined types were placed in this encounter.   Imaging: Xr Hip Unilat W Or W/o Pelvis 2-3 Views Left  Result Date: 08/06/2019 Stable total hip replacement without complication  Xr Wrist Complete Left  Result Date: 08/06/2019 Stable fixation plate and alignment of distal radius fracture.   PMFS History: Patient Active Problem List   Diagnosis Date Noted  . Left displaced femoral neck fracture ([place])   . Closed fracture of left distal radius and ulna, initial encounter   . Closed left hip  fracture ([place]) 07/22/2019  . Left wrist fracture 07/22/2019  . Rib fracture 07/22/2019  . Ascites 04/16/2019  . Displaced fracture of distal end of right radius 12/23/2016  . Seizure ([place]) 10/29/2016  . Possible exposure to STD 06/07/2016  . S/P total knee replacement using cement 07/15/2015  . Chest pain 04/30/2015  . Left inguinal hernia 10/22/2014  . Hyponatremia 05/06/2014  . Diarrhea 04/30/2014  . UTI (urinary tract infection) 07/29/2013  . Tibial plateau fracture, tibial shaft fracture 07/24/2013  . Erectile dysfunction 01/07/2013  . Hypokalemia 10/21/2011  . Eczema 10/21/2011  . Impaired glucose tolerance 10/19/2011  . Hepatic encephalopathy ([place]) 08/05/2011  . Depression   . Seizures ([place])   . Wilson disease   . Encephalopathy   . Thrombocytopenia ([place])   . Chronic liver disease and cirrhosis ([place])   . History of alcohol abuse   . Preventative health care 04/14/2011  . Bipolar affective disorder [facility])    Past Medical History:  Diagnosis Date  . Abuse, drug or alcohol ([place])   . Anemia   . Bipolar affective disorder ([place])    [place]  . Cancer [facility])    liver cancer treated with micrablation  . Chronic liver disease and cirrhosis   . Depression   . DJD (degenerative joint disease)    right knee  . Dyslipidemia   .  Encephalopathy   . Esophageal varices ([place])   . Hernia   . History of alcohol abuse   . PONV (postoperative nausea and vomiting)   . Seizures ([place])    Dr [name]  . Thrombocytopenia ([place])   . Wilson disease   . Wrist fracture    right    Family History  Problem Relation Age of Onset  . Heart disease Other   . Diabetes Father   . Dementia Mother   . Glaucoma Brother     Past Surgical History:  Procedure Laterality Date  . ESOPHAGOGASTRODUODENOSCOPY    . HARDWARE REMOVAL Right 01/22/2015   Procedure: HARDWARE REMOVAL RIGHT LEG;  Surgeon: Joel Koyanagi, MD;  Location: [place];  Service: Orthopedics;  Laterality: Right;  . IR  PARACENTESIS  10/04/2018  . IR PARACENTESIS  04/29/2019  . left inguinal hernia  june 2012   [place]  . liver cancer  02/2015   ablation  . microablation of liver    . OPEN REDUCTION INTERNAL FIXATION (ORIF) DISTAL RADIAL FRACTURE Right 12/21/2016   Procedure: OPEN REDUCTION INTERNAL FIXATION (ORIF) RIGHT DISTAL RADIUS FRACTURE;  Surgeon: Joel Koyanagi, MD;  Location: [place];  Service: Orthopedics;  Laterality: Right;  . OPEN REDUCTION INTERNAL FIXATION (ORIF) TIBIA/FIBULA FRACTURE Right 07/25/2013   Procedure: OPEN REDUCTION INTERNAL FIXATION (ORIF) RIGHT TIBIAL PLATEAU, TIBIAL SHAFT AND FIBULA FRACTURES, POSSIBLE FASCIOTOMIES;  Surgeon: Joel Payment, MD;  Location: [place];  Service: Orthopedics;  Laterality: Right;  . ORIF WRIST FRACTURE Left 07/23/2019   Procedure: OPEN REDUCTION INTERNAL FIXATION (ORIF) WRIST FRACTURE;  Surgeon: Joel Koyanagi, MD;  Location: [place];  Service: Orthopedics;  Laterality: Left;  . TOTAL HIP ARTHROPLASTY Left 07/23/2019   Procedure: TOTAL HIP ARTHROPLASTY ANTERIOR APPROACH;  Surgeon: Joel Koyanagi, MD;  Location: [place];  Service: Orthopedics;  Laterality: Left;  . TOTAL KNEE ARTHROPLASTY Right 07/15/2015   Procedure: RIGHT TOTAL KNEE ARTHROPLASTY;  Surgeon: Joel Koyanagi, MD;  Location: [place];  Service: Orthopedics;  Laterality: Right;  [name] VASCULAR SURGERY     Social History   Occupational History  . Occupation: disabled    Employer: [employer]  Tobacco Use  . Smoking status: Current Some Day Smoker    Packs/day: 0.25    Types: Cigarettes  . Smokeless tobacco: Never Used  Substance and Sexual Activity  . Alcohol use: No    Alcohol/week: 0.0 standard drinks    Comment: 3-4 bottles a week, quit drinking 2005  . Drug use: No    Comment: last use 2005  . Sexual activity: Yes

## 2019-08-18 ENCOUNTER — Ambulatory Visit: Payer: Medicare Other | Admitting: Internal Medicine

## 2019-08-21 IMAGING — CT CT PELVIS W/ CM
2 of 6 series · 12 of 46 positions shown, 14 images · IV contrast (iopamidol)
Comparison: CT abdomen pelvis of 12/24/2014

CLINICAL DATA: Testicular swelling, question inguinal hernia,
history of carcinoma of the liver in 9494

EXAM:
CT PELVIS WITH CONTRAST
TECHNIQUE: Multidetector CT imaging of the pelvis was performed using the
standard protocol following the bolus administration of intravenous
contrast.
CONTRAST:  100mL AHO97U-ULL IOPAMIDOL (AHO97U-ULL) INJECTION 61%

[Series 2: pelvis 5.00 br40 s3 ax · axial · 0.54mm/px · z∈[+1180,+1445]mm · 9 of 65 slices shown, 11 images]
[im 6/65  soft-tissue]
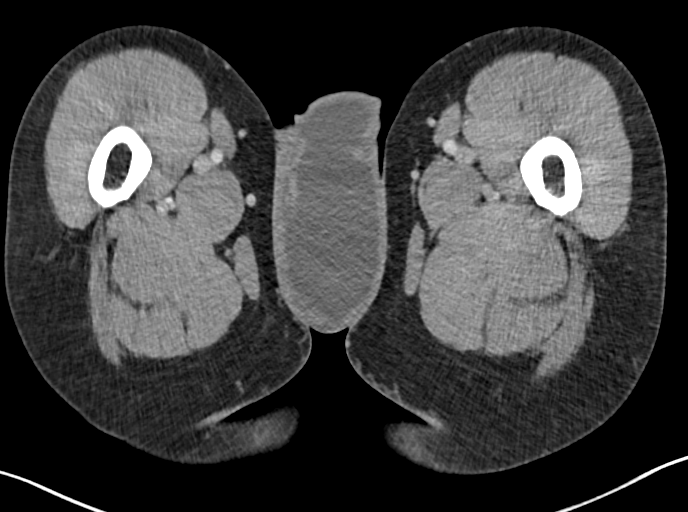
[im 6/65  bone]
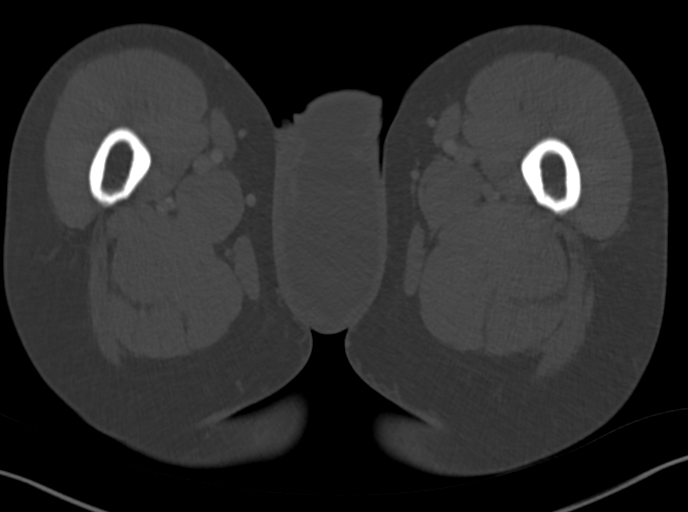
[im 12/65  soft-tissue]
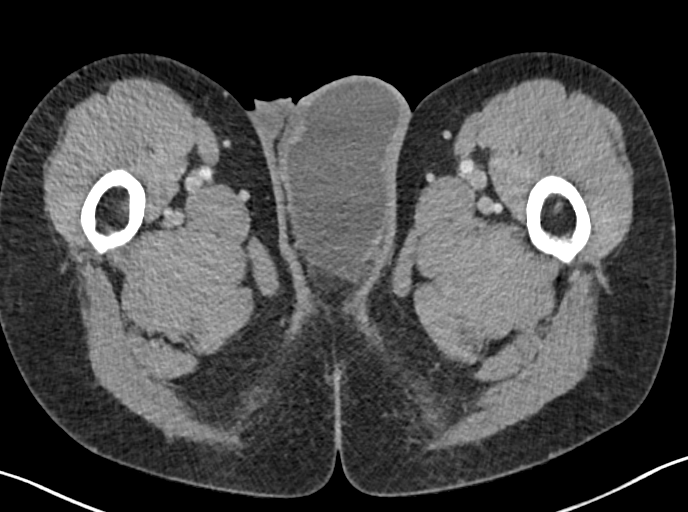
[im 20/65  soft-tissue]
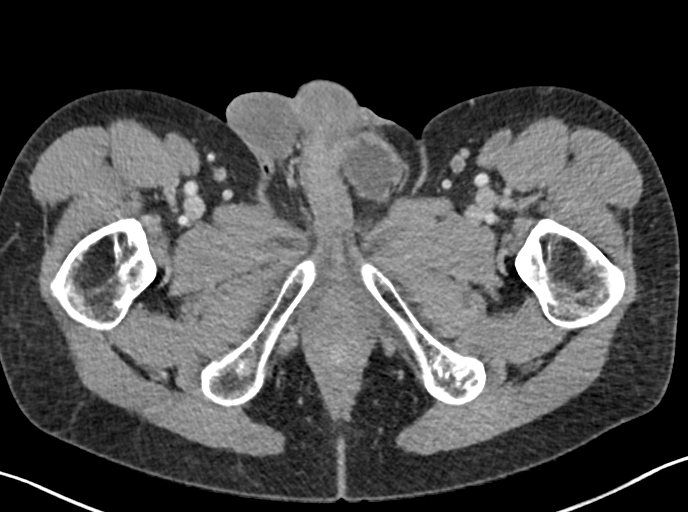
[im 26/65  soft-tissue]
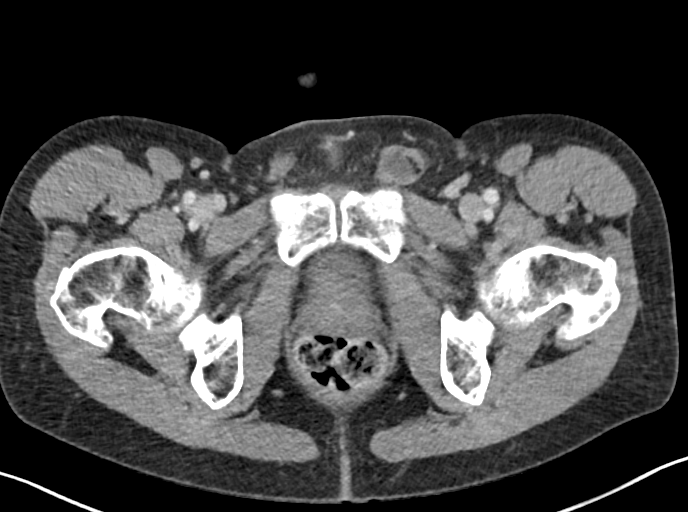
[im 34/65  soft-tissue]
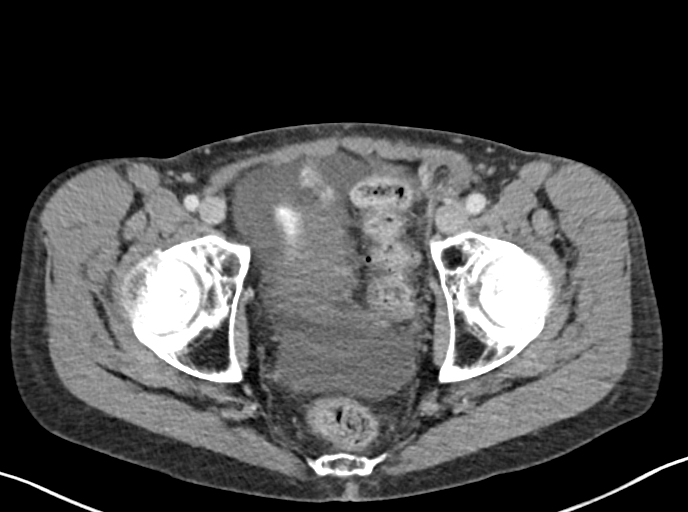
[im 39/65  soft-tissue]
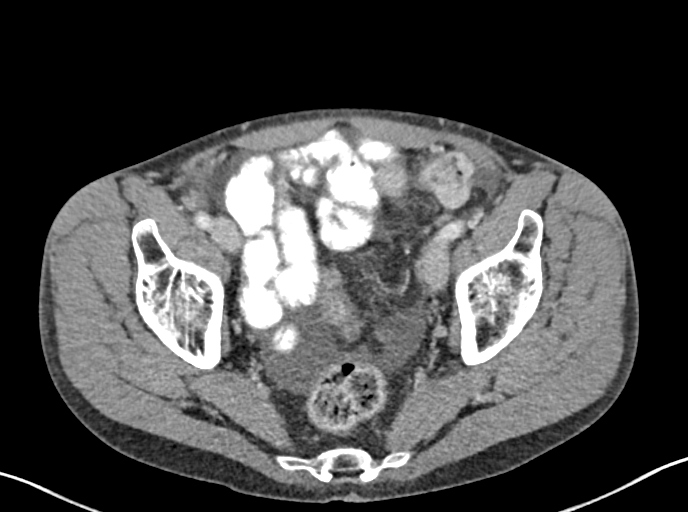
[im 45/65  soft-tissue]
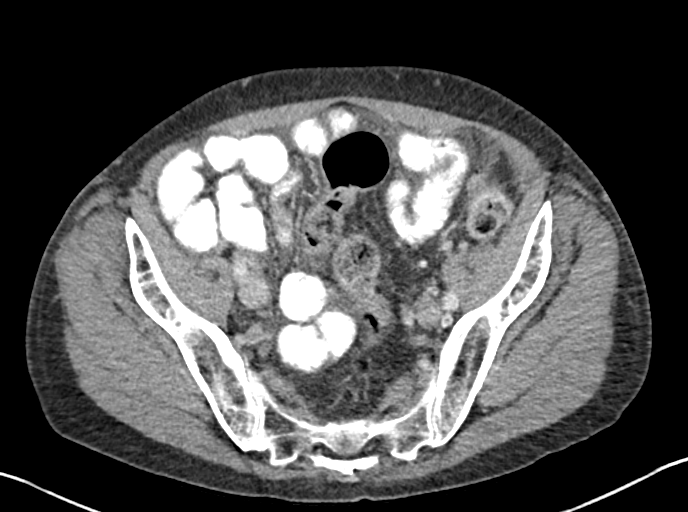
[im 53/65  soft-tissue]
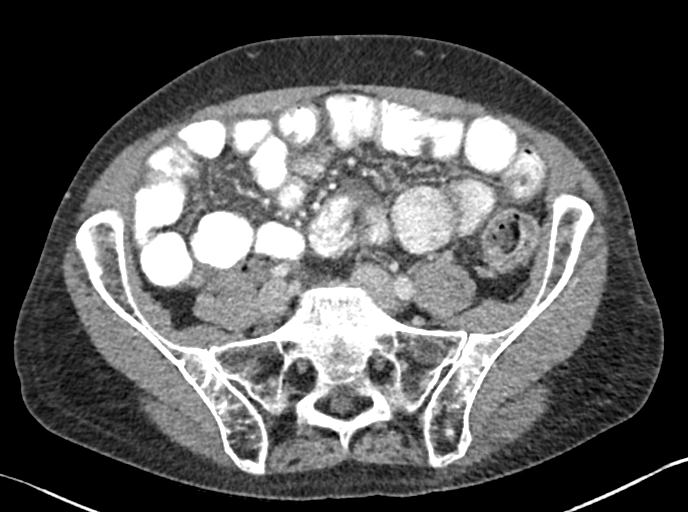
[im 59/65  soft-tissue]
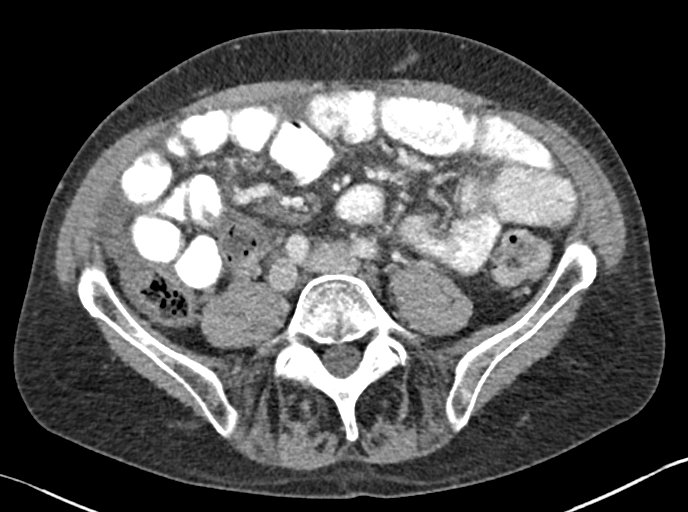
[im 59/65  bone]
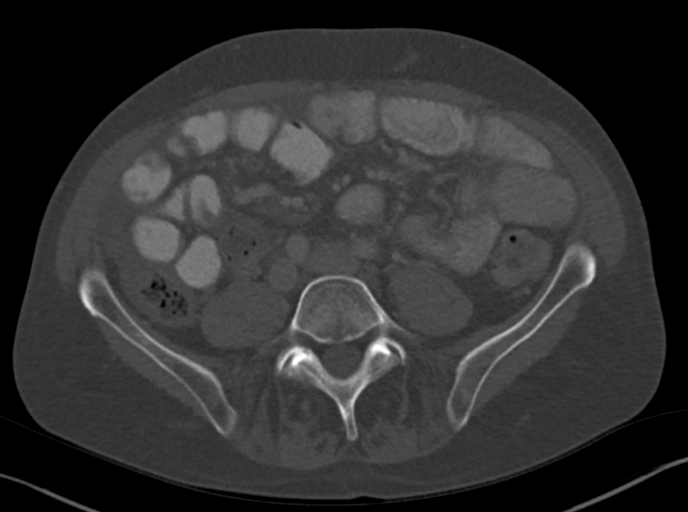

[Series 4: pelvis 2.00 br40 s3 cor · coronal · 0.59mm/px · 3 of 137 slices shown]
[im 35/137  soft-tissue]
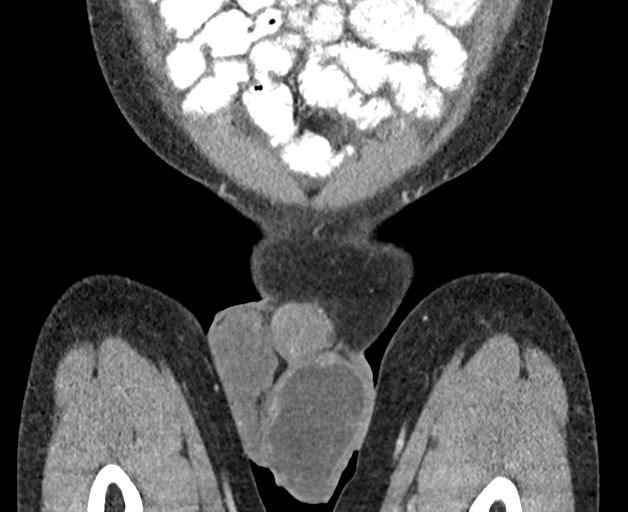
[im 69/137  soft-tissue]
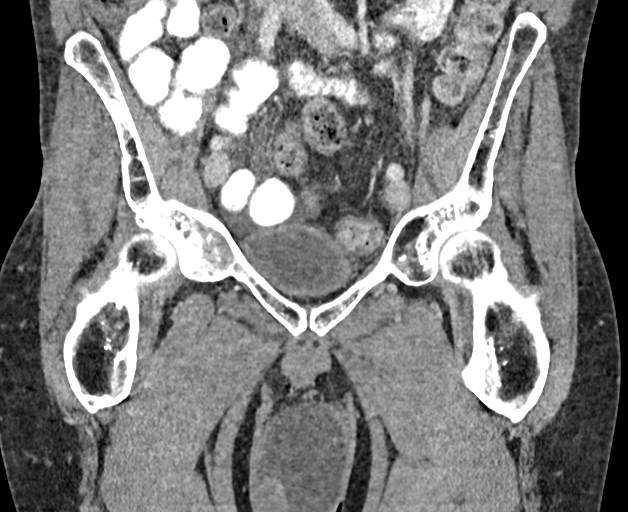
[im 103/137  soft-tissue]
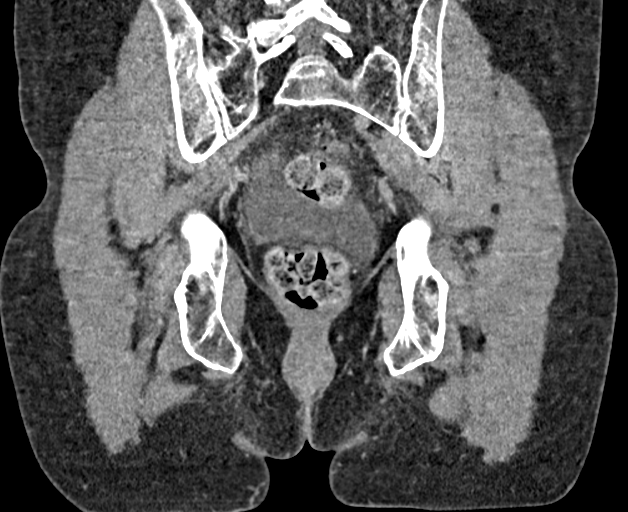

[12 of 46 positions shown; findings below may reference images not displayed]

FINDINGS: Urinary Tract: The ureters do not appear to be dilated. The urinary
bladder is not well distended but no abnormality is evident.

Bowel: There is feces throughout the somewhat tortuous rectosigmoid
colon. No abnormality of the right colon is seen other than
questionable thickening of the mucosa of the cecum. Minimally
prominent small bowel loops are present without definite edema. On
image 15 series 2 a polypoid lesion within the ileum cannot be
excluded. This may simply represent food debris within the small
bowel.

Vascular/Lymphatic: The abdominal aorta is normal in caliber. No
adenopathy is seen.

Reproductive:  The prostate gland is unremarkable.

Other: There is a moderate amount of ascites layering dependently
within the pelvis, increased in volume compared to the CT from 9494.
Also, on the prior study there was ascites noted flowing into the
inguinal canal and into the left scrotum creating a left scrotal
hydrocele. This left scrotal hydrocele has increased in volume with
increased amount of ascites present. No bowel enters this left
inguinal hernia.

Musculoskeletal: The sacrococcygeal elements are in normal
alignment. The SI joints appear corticated.
IMPRESSION: 1. Interval increase in amount of ascitic fluid tracking into the
left inguinal hernia creating a large left scrotal hydrocele. No
bowel enters this inguinal hernia.
2. Some increase in volume of ascites throughout the peritoneal
cavity layering dependently within the pelvis.
3. Cannot exclude a polypoid lesion within the ileum as noted above
versus food debris.

## 2019-08-25 ENCOUNTER — Other Ambulatory Visit: Payer: Self-pay | Admitting: Physician Assistant

## 2019-08-25 ENCOUNTER — Ambulatory Visit (INDEPENDENT_AMBULATORY_CARE_PROVIDER_SITE_OTHER): Payer: Medicare Other | Admitting: Internal Medicine

## 2019-08-25 ENCOUNTER — Encounter: Payer: Self-pay | Admitting: Internal Medicine

## 2019-08-25 ENCOUNTER — Telehealth: Payer: Self-pay | Admitting: Physician Assistant

## 2019-08-25 DIAGNOSIS — F32A Depression, unspecified: Secondary | ICD-10-CM

## 2019-08-25 DIAGNOSIS — R7302 Impaired glucose tolerance (oral): Secondary | ICD-10-CM

## 2019-08-25 DIAGNOSIS — R569 Unspecified convulsions: Secondary | ICD-10-CM

## 2019-08-25 DIAGNOSIS — F329 Major depressive disorder, single episode, unspecified: Secondary | ICD-10-CM | POA: Diagnosis not present

## 2019-08-25 MED ORDER — HYDROCODONE-ACETAMINOPHEN 5-325 MG PO TABS: 1.0000 | ORAL_TABLET | Freq: Two times a day (BID) | ORAL | 0 refills | Status: DC | PRN

## 2019-08-25 NOTE — Telephone Encounter (Signed)
I just sent in norco

## 2019-08-25 NOTE — Telephone Encounter (Signed)
Patient called needing Rx refilled (Oxycodone) The number to contact patient is [phone]

## 2019-08-25 NOTE — Telephone Encounter (Signed)
Called patient to advise  °

## 2019-08-25 NOTE — Progress Notes (Signed)
Patient ID: [name], male   DOB: 01/20/1966, 53 y.o.   MRN: [id number]  Virtual Visit via Video Note  I connected with [name] on 08/25/19 at  3:00 PM EST by a video enabled telemedicine application and verified that I am speaking with the correct person using two identifiers.  Location: Patient: at home Provider: at office   I discussed the limitations of evaluation and management by telemedicine and the availability of in person appointments. The patient expressed understanding and agreed to proceed.  History of Present Illness: Here after recent hx siezure breakthrough suffering fall nov 15 to left hip with 2 wks in rehab.  Home since dec 15. Hx of osteoporosis due to wilson dz.  Keppra increased 1500 bid, no further siezure.  Pt denies chest pain, increased sob or doe, wheezing, orthopnea, PND, increased LE swelling, palpitations, dizziness or syncope.  Pt denies new neurological symptoms such as new headache, or facial or extremity weakness or numbness   Pt denies polydipsia, polyuria,   Pt denies fever, wt loss, night sweats, loss of appetite, or other constitutional symptoms.  Denies worsening depressive symptoms, suicidal ideation, or panic Past Medical History:  Diagnosis Date  . Abuse, drug or alcohol ([place])   . Anemia   . Bipolar affective disorder ([place])    [place]  . Cancer [facility])    liver cancer treated with micrablation  . Chronic liver disease and cirrhosis   . Depression   . DJD (degenerative joint disease)    right knee  . Dyslipidemia   . Encephalopathy   . Esophageal varices ([place])   . Hernia   . History of alcohol abuse   . PONV (postoperative nausea and vomiting)   . Seizures ([place])    Dr Jannifer Franklin  . Thrombocytopenia ([place])   . Wilson disease   . Wrist fracture    right   Past Surgical History:  Procedure Laterality Date  . ESOPHAGOGASTRODUODENOSCOPY    . HARDWARE REMOVAL Right 01/22/2015   Procedure: HARDWARE REMOVAL  RIGHT LEG;  Surgeon: Leandrew Koyanagi, MD;  Location: [place];  Service: Orthopedics;  Laterality: Right;  . IR PARACENTESIS  10/04/2018  . IR PARACENTESIS  04/29/2019  . left inguinal hernia  june 2012   [place]  . liver cancer  02/2015   ablation  . microablation of liver    . OPEN REDUCTION INTERNAL FIXATION (ORIF) DISTAL RADIAL FRACTURE Right 12/21/2016   Procedure: OPEN REDUCTION INTERNAL FIXATION (ORIF) RIGHT DISTAL RADIUS FRACTURE;  Surgeon: Leandrew Koyanagi, MD;  Location: [place];  Service: Orthopedics;  Laterality: Right;  . OPEN REDUCTION INTERNAL FIXATION (ORIF) TIBIA/FIBULA FRACTURE Right 07/25/2013   Procedure: OPEN REDUCTION INTERNAL FIXATION (ORIF) RIGHT TIBIAL PLATEAU, TIBIAL SHAFT AND FIBULA FRACTURES, POSSIBLE FASCIOTOMIES;  Surgeon: Marianna Payment, MD;  Location: [place];  Service: Orthopedics;  Laterality: Right;  . ORIF WRIST FRACTURE Left 07/23/2019   Procedure: OPEN REDUCTION INTERNAL FIXATION (ORIF) WRIST FRACTURE;  Surgeon: Leandrew Koyanagi, MD;  Location: [place];  Service: Orthopedics;  Laterality: Left;  . TOTAL HIP ARTHROPLASTY Left 07/23/2019   Procedure: TOTAL HIP ARTHROPLASTY ANTERIOR APPROACH;  Surgeon: Leandrew Koyanagi, MD;  Location: [place];  Service: Orthopedics;  Laterality: Left;  . TOTAL KNEE ARTHROPLASTY Right 07/15/2015   Procedure: RIGHT TOTAL KNEE ARTHROPLASTY;  Surgeon: Leandrew Koyanagi, MD;  Location: [place];  Service: Orthopedics;  Laterality: Right;  [name] VASCULAR SURGERY      reports  that he has been smoking cigarettes. He has been smoking about 0.25 packs per day. He has never used smokeless tobacco. He reports that he does not drink alcohol or use drugs. family history includes Dementia in his mother; Diabetes in his father; Glaucoma in his brother; Heart disease in an other family member. Allergies  Allergen Reactions  . Codeine Nausea And Vomiting and Other (See Comments)    Reports it makes me crazy.    Current Outpatient Medications on File Prior to Visit   Medication Sig Dispense Refill  . aspirin EC 81 MG tablet Take one tablet bid x 6 weeks to prevent blood clots 84 tablet 0  . feeding supplement, ENSURE ENLIVE, (ENSURE ENLIVE) LIQD Take 237 mLs by mouth 2 (two) times daily between meals. 237 mL 12  . furosemide (LASIX) 40 MG tablet Take 2 tablets (80 mg total) by mouth 2 (two) times daily. 120 tablet 5  . levETIRAcetam (KEPPRA) 1000 MG tablet Take 1 tablet (1,000 mg total) by mouth 2 (two) times daily. (Patient taking differently: Take 1,500 mg by mouth 2 (two) times daily. ) 180 tablet 3  . Melatonin 3 MG TABS Take 3 mg by mouth at bedtime.    . potassium chloride (K-DUR) 10 MEQ tablet Take 1 tablet (10 mEq total) by mouth 2 (two) times daily. 180 tablet 1  . POTASSIUM CITRATE PO Take 200 mg by mouth daily.    [name] spironolactone (ALDACTONE) 100 MG tablet Take 2 tablets (200 mg total) by mouth every morning. 90 tablet 1  . XIFAXAN 550 MG TABS tablet Take 1 tablet (550 mg total) by mouth 3 (three) times daily. Take 550 mg by mouth 3 (three) times daily. 42 tablet 5  . zinc gluconate 50 MG tablet Take 50 mg by mouth 3 (three) times daily.     . folic acid (FOLVITE) 1 MG tablet Take 1 tablet (1 mg total) by mouth daily. Take 1 mg by mouth daily. (Patient not taking: Reported on 07/22/2019) 90 tablet 1  . oxyCODONE (OXY IR/ROXICODONE) 5 MG immediate release tablet Take 1-2 tablets (5-10 mg total) by mouth every 6 (six) hours as needed for moderate pain (pain score 4-6). (Patient not taking: Reported on 08/25/2019) 30 tablet 0  . tadalafil (CIALIS) 5 MG tablet Take 5 mg by mouth daily as needed for erectile dysfunction.      No current facility-administered medications on file prior to visit.    Observations/Objective: Alert, NAD, appropriate mood and affect, resps normal, cn 2-12 intact, moves all 4s, no visible rash or swelling Lab Results  Component Value Date   WBC 7.4 07/26/2019   HGB 9.8 (L) 07/26/2019   HCT 29.4 (L) 07/26/2019   PLT 63 (L)  07/26/2019   GLUCOSE 115 (H) 07/25/2019   CHOL 136 06/11/2019   TRIG 69.0 06/11/2019   HDL 37.70 (L) 06/11/2019   LDLDIRECT 117.8 01/07/2013   LDLCALC 84 06/11/2019   ALT 49 (H) 07/23/2019   AST 53 (H) 07/23/2019   NA 135 07/25/2019   K 4.2 07/25/2019   CL 103 07/25/2019   CREATININE 0.70 07/25/2019   BUN 19 07/25/2019   CO2 25 07/25/2019   TSH 1.90 06/11/2019   PSA 0.02 (L) 06/11/2019   INR 1.5 (H) 07/21/2019   HGBA1C 4.4 (L) 01/07/2013   Assessment and Plan: See notes  Follow Up Instructions: See notes   I discussed the assessment and treatment plan with the patient. The patient was provided an opportunity to  ask questions and all were answered. The patient agreed with the plan and demonstrated an understanding of the instructions.   The patient was advised to call back or seek an in-person evaluation if the symptoms worsen or if the condition fails to improve as anticipated.  Cathlean Cower, MD

## 2019-09-02 DIAGNOSIS — R188 Other ascites: Secondary | ICD-10-CM | POA: Diagnosis not present

## 2019-09-02 DIAGNOSIS — Z20822 Contact with and (suspected) exposure to covid-19: Secondary | ICD-10-CM | POA: Diagnosis not present

## 2019-09-02 DIAGNOSIS — Z743 Need for continuous supervision: Secondary | ICD-10-CM | POA: Diagnosis not present

## 2019-09-02 DIAGNOSIS — R0902 Hypoxemia: Secondary | ICD-10-CM | POA: Diagnosis not present

## 2019-09-02 DIAGNOSIS — R000 Tachycardia, unspecified: Secondary | ICD-10-CM | POA: Diagnosis not present

## 2019-09-02 DIAGNOSIS — R600 Localized edema: Secondary | ICD-10-CM | POA: Diagnosis not present

## 2019-09-02 DIAGNOSIS — K7290 Hepatic failure, unspecified without coma: Secondary | ICD-10-CM | POA: Diagnosis not present

## 2019-09-02 DIAGNOSIS — E871 Hypo-osmolality and hyponatremia: Secondary | ICD-10-CM | POA: Diagnosis not present

## 2019-09-02 DIAGNOSIS — D638 Anemia in other chronic diseases classified elsewhere: Secondary | ICD-10-CM | POA: Diagnosis not present

## 2019-09-02 DIAGNOSIS — Z87891 Personal history of nicotine dependence: Secondary | ICD-10-CM | POA: Diagnosis not present

## 2019-09-02 DIAGNOSIS — Z1159 Encounter for screening for other viral diseases: Secondary | ICD-10-CM | POA: Diagnosis not present

## 2019-09-02 DIAGNOSIS — R609 Edema, unspecified: Secondary | ICD-10-CM | POA: Diagnosis not present

## 2019-09-02 DIAGNOSIS — K7200 Acute and subacute hepatic failure without coma: Secondary | ICD-10-CM | POA: Diagnosis not present

## 2019-09-02 DIAGNOSIS — D696 Thrombocytopenia, unspecified: Secondary | ICD-10-CM | POA: Diagnosis not present

## 2019-09-02 DIAGNOSIS — D649 Anemia, unspecified: Secondary | ICD-10-CM | POA: Diagnosis not present

## 2019-09-02 DIAGNOSIS — R410 Disorientation, unspecified: Secondary | ICD-10-CM | POA: Diagnosis not present

## 2019-09-02 DIAGNOSIS — E440 Moderate protein-calorie malnutrition: Secondary | ICD-10-CM | POA: Diagnosis not present

## 2019-09-03 ENCOUNTER — Ambulatory Visit: Payer: Medicaid Other | Admitting: Physician Assistant

## 2019-09-07 ENCOUNTER — Encounter: Payer: Self-pay | Admitting: Internal Medicine

## 2019-09-07 NOTE — Assessment & Plan Note (Signed)
stable overall by history and exam, recent data reviewed with pt, and pt to continue medical treatment as before,  to f/u any worsening symptoms or concerns  

## 2019-09-07 NOTE — Assessment & Plan Note (Signed)
Stable on increased keppra,  to f/u any worsening symptoms or concerns

## 2019-09-07 NOTE — Patient Instructions (Signed)
Please continue all other medications as before, and refills have been done if requested.  Please have the pharmacy call with any other refills you may need.  Please continue your efforts at being more active, low cholesterol diet, and weight control.  Please keep your appointments with your specialists as you may have planned     

## 2019-09-10 ENCOUNTER — Other Ambulatory Visit: Payer: Self-pay

## 2019-09-10 ENCOUNTER — Ambulatory Visit: Payer: Self-pay

## 2019-09-10 ENCOUNTER — Encounter: Payer: Self-pay | Admitting: Orthopaedic Surgery

## 2019-09-10 ENCOUNTER — Ambulatory Visit (INDEPENDENT_AMBULATORY_CARE_PROVIDER_SITE_OTHER): Payer: Medicare Other | Admitting: Orthopaedic Surgery

## 2019-09-10 VITALS — Ht 73.0 in | Wt 200.0 lb

## 2019-09-10 DIAGNOSIS — S52502A Unspecified fracture of the lower end of left radius, initial encounter for closed fracture: Secondary | ICD-10-CM

## 2019-09-10 DIAGNOSIS — S72002A Fracture of unspecified part of neck of left femur, initial encounter for closed fracture: Secondary | ICD-10-CM | POA: Diagnosis not present

## 2019-09-10 DIAGNOSIS — M722 Plantar fascial fibromatosis: Secondary | ICD-10-CM | POA: Diagnosis not present

## 2019-09-10 DIAGNOSIS — S52602A Unspecified fracture of lower end of left ulna, initial encounter for closed fracture: Secondary | ICD-10-CM

## 2019-09-10 MED ORDER — HYDROCODONE-ACETAMINOPHEN 5-325 MG PO TABS: 1.0000 | ORAL_TABLET | Freq: Two times a day (BID) | ORAL | 0 refills | Status: DC | PRN

## 2019-09-10 NOTE — Progress Notes (Signed)
Post-Op Visit Note   Patient: [name]           Date of Birth: 12-19-1965           MRN: [id number] Visit Date: 09/10/2019 PCP: Biagio Borg, MD   Assessment & Plan:  Chief Complaint:  Chief Complaint  Patient presents with  . Left Wrist - Follow-up  . Left Hip - Routine Post Op   Visit Diagnoses:  1. Left displaced femoral neck fracture (HCC)   2. Closed fracture of left distal radius and ulna, initial encounter   3. Plantar fasciitis, left     Plan: Patient is a pleasant 54 year old gentleman who comes in today 7 weeks status post ORIF left distal radius fracture and left total hip replacement from a femoral neck fracture, date of surgery 07/23/2019.  He has been doing well.  He has been weightbearing as tolerated left upper and left lower extremity.  He has not been wearing a brace to the left wrist.  He denies any pain to the left wrist or left hip.  He has also been complaining of left heel pain for the past 10 days.  No new injury.  He has been walking with an altered gait due to his recent surgical intervention.  All of his pain is to the heel and is worse first thing in the morning as well as the first few steps after being seated for period of time.  No numbness, tingling or burning.  Examination of the left wrist reveals no swelling.  No tenderness to the fracture site.  Near full range of motion.  Left hip exam shows a negative logroll.  He is able to fully hip flex.  Left foot shows marked tenderness to the plantar fascial insertion heel.  He has limited dorsiflexion.  He is neurovascular intact distally.  Impression is #1 status post left total hip replacement.  #2 status post ORIF left distal radius fracture.  #3 left foot plantar fasciitis.  In regards to the left hip and left wrist, he will advance with activity as tolerated.  No lifting more than 5-10 10 pounds to the left upper extremity.  He will continue with exercises for his left hip and wrist.  In regards  to the left heel, I recommended full foot orthotics and have provided him with Achilles stretching program.  He will follow-up with [place] in 5 weeks time for repeat evaluation and AP pelvis lateral left hip x-rays as well as 3 view x-rays of the left wrist.  Call with concerns or questions in the meantime.  Follow-Up Instructions: Return in about 5 weeks (around 10/15/2019).   Orders:  Orders Placed This Encounter  Procedures  . XR HIP UNILAT W OR W/O PELVIS 2-3 VIEWS LEFT  . XR Wrist Complete Left   Meds ordered this encounter  Medications  . HYDROcodone-acetaminophen (NORCO) 5-325 MG tablet    Sig: Take 1-2 tablets by mouth 2 (two) times daily as needed for moderate pain.    Dispense:  30 tablet    Refill:  0    Imaging: XR HIP UNILAT W OR W/O PELVIS 2-3 VIEWS LEFT  Result Date: 09/10/2019 X-rays demonstrate a well-seated prosthesis without complication  XR Wrist Complete Left  Result Date: 09/10/2019 X-rays demonstrate stable alignment of the fracture and hardware   PMFS History: Patient Active Problem List   Diagnosis Date Noted  . Left displaced femoral neck fracture ([place])   . Closed fracture of left distal  radius and ulna, initial encounter   . Closed left hip fracture ([place]) 07/22/2019  . Left wrist fracture 07/22/2019  . Rib fracture 07/22/2019  . Ascites 04/16/2019  . Displaced fracture of distal end of right radius 12/23/2016  . Seizure ([place]) 10/29/2016  . Possible exposure to STD 06/07/2016  . S/P total knee replacement using cement 07/15/2015  . Chest pain 04/30/2015  . Left inguinal hernia 10/22/2014  . Hyponatremia 05/06/2014  . Diarrhea 04/30/2014  . UTI (urinary tract infection) 07/29/2013  . Tibial plateau fracture, tibial shaft fracture 07/24/2013  . Erectile dysfunction 01/07/2013  . Hypokalemia 10/21/2011  . Eczema 10/21/2011  . Impaired glucose tolerance 10/19/2011  . Hepatic encephalopathy ([place]) 08/05/2011  . Depression   . Seizures ([place])   .  Wilson disease   . Encephalopathy   . Thrombocytopenia ([place])   . Chronic liver disease and cirrhosis ([place])   . History of alcohol abuse   . Preventative health care 04/14/2011  . Bipolar affective disorder [facility])    Past Medical History:  Diagnosis Date  . Abuse, drug or alcohol ([place])   . Anemia   . Bipolar affective disorder ([place])    [place]  . Cancer [facility])    liver cancer treated with micrablation  . Chronic liver disease and cirrhosis   . Depression   . DJD (degenerative joint disease)    right knee  . Dyslipidemia   . Encephalopathy   . Esophageal varices ([place])   . Hernia   . History of alcohol abuse   . PONV (postoperative nausea and vomiting)   . Seizures ([place])    Dr Jannifer Franklin  . Thrombocytopenia ([place])   . Wilson disease   . Wrist fracture    right    Family History  Problem Relation Age of Onset  . Heart disease Other   . Diabetes Father   . Dementia Mother   . Glaucoma Brother     Past Surgical History:  Procedure Laterality Date  . ESOPHAGOGASTRODUODENOSCOPY    . HARDWARE REMOVAL Right 01/22/2015   Procedure: HARDWARE REMOVAL RIGHT LEG;  Surgeon: Leandrew Koyanagi, MD;  Location: [place];  Service: Orthopedics;  Laterality: Right;  . IR PARACENTESIS  10/04/2018  . IR PARACENTESIS  04/29/2019  . left inguinal hernia  june 2012   [place]  . liver cancer  02/2015   ablation  . microablation of liver    . OPEN REDUCTION INTERNAL FIXATION (ORIF) DISTAL RADIAL FRACTURE Right 12/21/2016   Procedure: OPEN REDUCTION INTERNAL FIXATION (ORIF) RIGHT DISTAL RADIUS FRACTURE;  Surgeon: Leandrew Koyanagi, MD;  Location: [place];  Service: Orthopedics;  Laterality: Right;  . OPEN REDUCTION INTERNAL FIXATION (ORIF) TIBIA/FIBULA FRACTURE Right 07/25/2013   Procedure: OPEN REDUCTION INTERNAL FIXATION (ORIF) RIGHT TIBIAL PLATEAU, TIBIAL SHAFT AND FIBULA FRACTURES, POSSIBLE FASCIOTOMIES;  Surgeon: Marianna Payment, MD;  Location: [place];  Service: Orthopedics;   Laterality: Right;  . ORIF WRIST FRACTURE Left 07/23/2019   Procedure: OPEN REDUCTION INTERNAL FIXATION (ORIF) WRIST FRACTURE;  Surgeon: Leandrew Koyanagi, MD;  Location: [place];  Service: Orthopedics;  Laterality: Left;  . TOTAL HIP ARTHROPLASTY Left 07/23/2019   Procedure: TOTAL HIP ARTHROPLASTY ANTERIOR APPROACH;  Surgeon: Leandrew Koyanagi, MD;  Location: [place];  Service: Orthopedics;  Laterality: Left;  . TOTAL KNEE ARTHROPLASTY Right 07/15/2015   Procedure: RIGHT TOTAL KNEE ARTHROPLASTY;  Surgeon: Leandrew Koyanagi, MD;  Location: [place];  Service: Orthopedics;  Laterality: Right;  .  VASCULAR SURGERY     Social History   Occupational History  . Occupation: disabled    Employer: [employer]  Tobacco Use  . Smoking status: Current Some Day Smoker    Packs/day: 0.25    Types: Cigarettes  . Smokeless tobacco: Never Used  Substance and Sexual Activity  . Alcohol use: No    Alcohol/week: 0.0 standard drinks    Comment: 3-4 bottles a week, quit drinking 2005  . Drug use: No    Comment: last use 2005  . Sexual activity: Yes

## 2019-09-16 DIAGNOSIS — D649 Anemia, unspecified: Secondary | ICD-10-CM | POA: Diagnosis not present

## 2019-09-16 DIAGNOSIS — C220 Liver cell carcinoma: Secondary | ICD-10-CM | POA: Diagnosis not present

## 2019-09-16 DIAGNOSIS — K7460 Unspecified cirrhosis of liver: Secondary | ICD-10-CM | POA: Diagnosis not present

## 2019-09-16 DIAGNOSIS — R569 Unspecified convulsions: Secondary | ICD-10-CM | POA: Diagnosis not present

## 2019-09-16 DIAGNOSIS — R5382 Chronic fatigue, unspecified: Secondary | ICD-10-CM | POA: Diagnosis not present

## 2019-09-16 DIAGNOSIS — Z23 Encounter for immunization: Secondary | ICD-10-CM | POA: Diagnosis not present

## 2019-09-30 DIAGNOSIS — C220 Liver cell carcinoma: Secondary | ICD-10-CM | POA: Diagnosis not present

## 2019-09-30 DIAGNOSIS — G4762 Sleep related leg cramps: Secondary | ICD-10-CM | POA: Diagnosis not present

## 2019-09-30 DIAGNOSIS — D696 Thrombocytopenia, unspecified: Secondary | ICD-10-CM | POA: Diagnosis not present

## 2019-09-30 DIAGNOSIS — E785 Hyperlipidemia, unspecified: Secondary | ICD-10-CM | POA: Diagnosis not present

## 2019-10-14 DIAGNOSIS — G4762 Sleep related leg cramps: Secondary | ICD-10-CM | POA: Diagnosis not present

## 2019-10-14 DIAGNOSIS — C220 Liver cell carcinoma: Secondary | ICD-10-CM | POA: Diagnosis not present

## 2019-10-14 DIAGNOSIS — E785 Hyperlipidemia, unspecified: Secondary | ICD-10-CM | POA: Diagnosis not present

## 2019-10-14 DIAGNOSIS — R569 Unspecified convulsions: Secondary | ICD-10-CM | POA: Diagnosis not present

## 2019-10-21 ENCOUNTER — Ambulatory Visit: Payer: Medicare Other | Admitting: Internal Medicine

## 2019-12-01 ENCOUNTER — Encounter: Payer: Self-pay | Admitting: Neurology

## 2019-12-01 ENCOUNTER — Other Ambulatory Visit: Payer: Self-pay

## 2019-12-01 ENCOUNTER — Ambulatory Visit (INDEPENDENT_AMBULATORY_CARE_PROVIDER_SITE_OTHER): Payer: 59 | Admitting: Neurology

## 2019-12-01 VITALS — BP 122/72 | HR 55 | Ht 72.0 in | Wt 207.0 lb

## 2019-12-01 DIAGNOSIS — G40009 Localization-related (focal) (partial) idiopathic epilepsy and epileptic syndromes with seizures of localized onset, not intractable, without status epilepticus: Secondary | ICD-10-CM | POA: Diagnosis not present

## 2019-12-01 MED ORDER — LEVETIRACETAM 500 MG PO TABS: 1500.0000 mg | ORAL_TABLET | Freq: Two times a day (BID) | ORAL | 3 refills | Status: DC
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
## 2019-12-01 NOTE — Patient Instructions (Signed)
Great meeting you! Continue Keppra 500mg : Take 3 tablets twice a day. Follow-up in 6 months, call for any changes.  Seizure Precautions: 1. If medication has been prescribed for you to prevent seizures, take it exactly as directed.  Do not stop taking the medicine without talking to your doctor first, even if you have not had a seizure in a long time.   2. Avoid activities in which a seizure would cause danger to yourself or to others.  Don't operate dangerous machinery, swim alone, or climb in high or dangerous places, such as on ladders, roofs, or girders.  Do not drive unless your doctor says you may.  3. If you have any warning that you may have a seizure, lay down in a safe place where you can't hurt yourself.    4.  No driving for 6 months from last seizure, as per [facility].   Please refer to the following link on the [place] website for more information: http://www.epilepsyfoundation.org/answerplace/Social/driving/drivingu.cfm   5.  Maintain good sleep hygiene. Avoid alcohol.  6.  Contact your doctor if you have any problems that may be related to the medicine you are taking.  7.  Call 911 and bring the patient back to the ED if:        A.  The seizure lasts longer than 5 minutes.       B.  The patient doesn't awaken shortly after the seizure  C.  The patient has new problems such as difficulty seeing, speaking or moving  D.  The patient was injured during the seizure  E.  The patient has a temperature over 102 F (39C)  F.  The patient vomited and now is having trouble breathing

## 2020-01-01 ENCOUNTER — Encounter: Payer: Self-pay | Admitting: Orthopaedic Surgery

## 2020-01-01 ENCOUNTER — Ambulatory Visit (INDEPENDENT_AMBULATORY_CARE_PROVIDER_SITE_OTHER): Payer: 59 | Admitting: Orthopaedic Surgery

## 2020-01-01 ENCOUNTER — Ambulatory Visit: Payer: Self-pay

## 2020-01-01 ENCOUNTER — Other Ambulatory Visit: Payer: Self-pay

## 2020-01-01 DIAGNOSIS — Z96642 Presence of left artificial hip joint: Secondary | ICD-10-CM | POA: Diagnosis not present

## 2020-01-01 DIAGNOSIS — S72002A Fracture of unspecified part of neck of left femur, initial encounter for closed fracture: Secondary | ICD-10-CM | POA: Diagnosis not present

## 2020-01-01 DIAGNOSIS — M5410 Radiculopathy, site unspecified: Secondary | ICD-10-CM | POA: Diagnosis not present

## 2020-01-01 MED ORDER — PREDNISONE 10 MG (21) PO TBPK: ORAL_TABLET | ORAL | 0 refills | Status: DC

## 2020-01-01 NOTE — Progress Notes (Signed)
Office Visit Note   Patient: [name]           Date of Birth: 11/06/1965           MRN: [id number] Visit Date: 01/01/2020              Requested by: Biagio Borg, MD [place],  [place] [id number] PCP: Biagio Borg, MD   Assessment & Plan: Visit Diagnoses:  1. Radicular pain of left lower extremity   2. Left displaced femoral neck fracture (HCC)   3. Status post total replacement of left hip     Plan: Impression is status post left total hip replacement from a femoral neck fracture and left lower extremity radiculopathy.  In regards to the left hip, he will continue to advance activity as tolerated.  Dental prophylaxis reinforced.  Follow-up with [place] in 7 months time he is 1 year out from surgery.  In regards to his left lower extremity radiculopathy, of called in a Sterapred taper.  Have also provided the patient with a hard prescription for physical therapy as he lives in [place].  He will follow-up with [place] as needed for his back.  Call with concerns or questions in meantime.  Follow-Up Instructions: Return in about 7 months (around 08/02/2020).   Orders:  Orders Placed This Encounter  Procedures  . XR HIP UNILAT W OR W/O PELVIS 2-3 VIEWS LEFT  . XR Lumbar Spine 2-3 Views  . Ambulatory referral to Physical Therapy   Meds ordered this encounter  Medications  . predniSONE (STERAPRED UNI-PAK 21 TAB) 10 MG (21) TBPK tablet    Sig: Take as directed    Dispense:  21 tablet    Refill:  0      Procedures: No procedures performed   Clinical Data: No additional findings.   Subjective: Chief Complaint  Patient presents with  . Left Hip - Pain    Left THA DOS 07/23/2019    HPI [name] is a pleasant 54 year old gentleman who comes in today 5 months out left total hip replacement from a femoral neck fracture as well as new onset left buttocks and left lower extremity radiculopathy pain for the past 2 weeks.  The pain he is having is described as a  constant pain without specific aggravators.  No pain at night.  He has been taking Tylenol without significant relief of symptoms.  He denies any numbness, tingling or burning.  No new weakness.  No previous lumbar spine pathology.  Review of Systems as detailed in HPI.  All others reviewed and are negative.   Objective: Vital Signs: There were no vitals taken for this visit.  Physical Exam well-developed well-nourished gentleman in no acute distress.  Alert and oriented x3.  Ortho Exam examination of the left hip reveals a negative logroll.  He does have a positive straight leg raise.  No spinous or paraspinous tenderness of the lumbar spine.  No pain with lumbar flexion or extension.  No gross weakness.  Is neurovascular intact distally.  Specialty Comments:  No specialty comments available.  Imaging: XR HIP UNILAT W OR W/O PELVIS 2-3 VIEWS LEFT  Result Date: 01/01/2020 Well-seated prosthesis without complication  XR Lumbar Spine 2-3 Views  Result Date: 01/01/2020 X-rays demonstrate moderate to space narrowing L4-5    PMFS History: Patient Active Problem List   Diagnosis Date Noted  . Left displaced femoral neck fracture ([place])   . Closed fracture of left distal radius and  ulna, initial encounter   . Closed left hip fracture ([place]) 07/22/2019  . Left wrist fracture 07/22/2019  . Rib fracture 07/22/2019  . Ascites 09/14/2018  . Alcoholic cirrhosis of liver with ascites ([place]) 07/19/2018  . Left hydrocele 07/19/2018  . Umbilical hernia without obstruction and without gangrene 07/19/2018  . Displaced fracture of distal end of right radius 12/23/2016  . Possible exposure to STD 06/07/2016  . S/P total knee replacement using cement 07/15/2015  . Bipolar affective disorder ([place]) 05/27/2015  . Depression 05/27/2015  . Thrombocytopenia ([place]) 05/27/2015  . History of alcohol abuse 05/27/2015  . Seizure ([place]) 05/27/2015  . Brain disorder 05/27/2015  . Chest pain 04/30/2015  .  Hepatocellular carcinoma ([place]) 02/15/2015  . Left inguinal hernia 10/22/2014  . Hyponatremia 05/06/2014  . Diarrhea 04/30/2014  . Wilson's disease 09/08/2013  . UTI (urinary tract infection) 07/29/2013  . Fracture of tibial plateau 07/24/2013  . ED (erectile dysfunction) of organic origin 01/07/2013  . Hypokalemia 10/21/2011  . Eczema 10/21/2011  . Impaired glucose tolerance 10/19/2011  . Hepatic encephalopathy ([place]) 08/05/2011  . Disorder of stomach and duodenum 08/03/2011  . Esophageal varices ([place]) 08/03/2011  . Portal hypertension ([place]) 08/03/2011  . Seizures ([place])   . Encephalopathy   . Preventative health care 04/14/2011  . Follow-up examination following surgery 02/23/2011  . Chronic liver disease and cirrhosis ([place]) 06/10/2009   Past Medical History:  Diagnosis Date  . Abuse, drug or alcohol ([place])   . Anemia   . Bipolar affective disorder ([place])    [place]  . Cancer [facility])    liver cancer treated with micrablation  . Chronic liver disease and cirrhosis   . Depression   . DJD (degenerative joint disease)    right knee  . Dyslipidemia   . Encephalopathy   . Esophageal varices ([place])   . Hernia   . History of alcohol abuse   . PONV (postoperative nausea and vomiting)   . Seizures ([place])    Dr Jannifer Franklin  . Thrombocytopenia ([place])   . Wilson disease   . Wrist fracture    right    Family History  Problem Relation Age of Onset  . Heart disease Other   . Diabetes Father   . Dementia Mother   . Glaucoma Brother     Past Surgical History:  Procedure Laterality Date  . ESOPHAGOGASTRODUODENOSCOPY    . HARDWARE REMOVAL Right 01/22/2015   Procedure: HARDWARE REMOVAL RIGHT LEG;  Surgeon: Leandrew Koyanagi, MD;  Location: [place];  Service: Orthopedics;  Laterality: Right;  . IR PARACENTESIS  10/04/2018  . IR PARACENTESIS  04/29/2019  . left inguinal hernia  june 2012   [place]  . liver cancer  02/2015   ablation  . microablation of liver    . OPEN  REDUCTION INTERNAL FIXATION (ORIF) DISTAL RADIAL FRACTURE Right 12/21/2016   Procedure: OPEN REDUCTION INTERNAL FIXATION (ORIF) RIGHT DISTAL RADIUS FRACTURE;  Surgeon: Leandrew Koyanagi, MD;  Location: [place];  Service: Orthopedics;  Laterality: Right;  . OPEN REDUCTION INTERNAL FIXATION (ORIF) TIBIA/FIBULA FRACTURE Right 07/25/2013   Procedure: OPEN REDUCTION INTERNAL FIXATION (ORIF) RIGHT TIBIAL PLATEAU, TIBIAL SHAFT AND FIBULA FRACTURES, POSSIBLE FASCIOTOMIES;  Surgeon: Marianna Payment, MD;  Location: [place];  Service: Orthopedics;  Laterality: Right;  . ORIF WRIST FRACTURE Left 07/23/2019   Procedure: OPEN REDUCTION INTERNAL FIXATION (ORIF) WRIST FRACTURE;  Surgeon: Leandrew Koyanagi, MD;  Location: [place];  Service: Orthopedics;  Laterality:  Left;  . TOTAL HIP ARTHROPLASTY Left 07/23/2019   Procedure: TOTAL HIP ARTHROPLASTY ANTERIOR APPROACH;  Surgeon: Leandrew Koyanagi, MD;  Location: [place];  Service: Orthopedics;  Laterality: Left;  . TOTAL KNEE ARTHROPLASTY Right 07/15/2015   Procedure: RIGHT TOTAL KNEE ARTHROPLASTY;  Surgeon: Leandrew Koyanagi, MD;  Location: [place];  Service: Orthopedics;  Laterality: Right;  [name] VASCULAR SURGERY     Social History   Occupational History  . Occupation: disabled    Employer: [employer]  Tobacco Use  . Smoking status: Former Smoker    Packs/day: 0.25    Types: Cigarettes  . Smokeless tobacco: Never Used  Substance and Sexual Activity  . Alcohol use: No    Alcohol/week: 0.0 standard drinks    Comment: 3-4 bottles a week, quit drinking 2005  . Drug use: No    Comment: last use 2005  . Sexual activity: Yes

## 2020-01-27 ENCOUNTER — Other Ambulatory Visit: Payer: Self-pay | Admitting: Internal Medicine

## 2020-01-27 NOTE — Telephone Encounter (Signed)
Please refill as per office routine med refill policy (all routine meds refilled for 3 mo or monthly per pt preference up to one year from last visit, then month to month grace period for 3 mo, then further med refills will have to be denied)  

## 2020-06-02 ENCOUNTER — Ambulatory Visit (INDEPENDENT_AMBULATORY_CARE_PROVIDER_SITE_OTHER): Payer: 59 | Admitting: Neurology

## 2020-06-02 ENCOUNTER — Other Ambulatory Visit: Payer: Self-pay

## 2020-06-02 ENCOUNTER — Encounter: Payer: Self-pay | Admitting: Neurology

## 2020-06-02 VITALS — BP 130/74 | HR 53 | Ht 73.0 in | Wt 206.8 lb

## 2020-06-02 DIAGNOSIS — G40009 Localization-related (focal) (partial) idiopathic epilepsy and epileptic syndromes with seizures of localized onset, not intractable, without status epilepticus: Secondary | ICD-10-CM | POA: Diagnosis not present

## 2020-06-02 MED ORDER — LEVETIRACETAM 500 MG PO TABS: 1500.0000 mg | ORAL_TABLET | Freq: Two times a day (BID) | ORAL | 3 refills | Status: DC

## 2020-06-02 NOTE — Patient Instructions (Signed)
Good to see you! Continue Keppra (Levetiracetam) 500mg : Take 3 tablets twice a day. Follow-up in 8 months, call for any changes.   Seizure Precautions: 1. If medication has been prescribed for you to prevent seizures, take it exactly as directed.  Do not stop taking the medicine without talking to your doctor first, even if you have not had a seizure in a long time.   2. Avoid activities in which a seizure would cause danger to yourself or to others.  Don't operate dangerous machinery, swim alone, or climb in high or dangerous places, such as on ladders, roofs, or girders.  Do not drive unless your doctor says you may.  3. If you have any warning that you may have a seizure, lay down in a safe place where you can't hurt yourself.    4.  No driving for 6 months from last seizure, as per [facility].   Please refer to the following link on the [place] website for more information: http://www.epilepsyfoundation.org/answerplace/Social/driving/drivingu.cfm   5.  Maintain good sleep hygiene. Avoid alcohol.  6.  Contact your doctor if you have any problems that may be related to the medicine you are taking.  7.  Call 911 and bring the patient back to the ED if:        A.  The seizure lasts longer than 5 minutes.       B.  The patient doesn't awaken shortly after the seizure  C.  The patient has new problems such as difficulty seeing, speaking or moving  D.  The patient was injured during the seizure  E.  The patient has a temperature over 102 F (39C)  F.  The patient vomited and now is having trouble breathing
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
## 2020-08-03 ENCOUNTER — Ambulatory Visit: Payer: 59 | Admitting: Orthopaedic Surgery

## 2020-12-20 ENCOUNTER — Telehealth: Payer: Self-pay | Admitting: Neurology

## 2020-12-20 MED ORDER — LEVETIRACETAM 500 MG PO TABS: 1500.0000 mg | ORAL_TABLET | Freq: Two times a day (BID) | ORAL | 3 refills | Status: AC

## 2020-12-20 NOTE — Telephone Encounter (Signed)
Patient needs a refill on the Keppra [facility] [place]  He will be out of medication Friday

## 2020-12-20 NOTE — Telephone Encounter (Signed)
[facility] [place], [place].

## 2021-01-21 ENCOUNTER — Telehealth: Payer: Self-pay | Admitting: Neurology

## 2021-01-21 NOTE — Telephone Encounter (Signed)
Pt went camping for 5 days he has a 7 day medication planner morning and night he has a dog and the dog wanted to go out and got distracted and wanted to go out and he put the medication in the morning and not in the night. So he missed a couple of nights medication. He did not sleep well while camping,  The seizure / fall happened on the front deck of his home his face is black and blue he bit his lip. Pt did go to the hospital to be evaluated in [place] via EMS and was sent home,  Pt is now home and taken his medication morning and night, was asking for something to help with his pain. Pt advised that we do not give medication for pain  Pt c/o: seizure Missed medications?  Yes.   Sleep deprived?  Yes.   Alcohol intake?  No. Back to their usual baseline self?  No.. Just hurting from the fall taken tylenol.  Current medications prescribed by Dr. Delice Lesch: levetiracetam 500 mg tablet Take 3 tablets (1,500 mg total) by mouth 2 (two) times daily

## 2021-01-21 NOTE — Telephone Encounter (Signed)
It's really only Tylenol, if there is significant pain, he may need to go to ER to make sure there are no fractures.

## 2021-01-21 NOTE — Telephone Encounter (Signed)
Patient returned call to Heather. 

## 2021-01-21 NOTE — Telephone Encounter (Signed)
Patient advised and voiced understanding.  

## 2021-01-21 NOTE — Telephone Encounter (Signed)
Patient called to report a fall during a seizure yesterday. He said he does not have any injuries but he is "extremely sore all over." He is asking for some medication to help with the pain.  [facility] [place]

## 2021-02-02 ENCOUNTER — Ambulatory Visit: Payer: 59 | Admitting: Neurology

## 2021-03-10 ENCOUNTER — Ambulatory Visit: Payer: Self-pay

## 2021-03-10 ENCOUNTER — Ambulatory Visit (INDEPENDENT_AMBULATORY_CARE_PROVIDER_SITE_OTHER): Payer: 59 | Admitting: Orthopaedic Surgery

## 2021-03-10 ENCOUNTER — Encounter: Payer: Self-pay | Admitting: Orthopaedic Surgery

## 2021-03-10 DIAGNOSIS — S32000A Wedge compression fracture of unspecified lumbar vertebra, initial encounter for closed fracture: Secondary | ICD-10-CM

## 2021-03-10 NOTE — Progress Notes (Signed)
Office Visit Note   Patient: [name]           Date of Birth: 1966/05/20           MRN: [id number] Visit Date: 03/10/2021              Requested by: Cher Nakai, MD [address] [place],  [place] [address] PCP: Cher Nakai, MD   Assessment & Plan: Visit Diagnoses:  1. Lumbar compression fracture, closed, initial encounter ([place])     Plan: The L4 compression fracture has mild height loss.  He does have a lumbar corset at home which she is not wearing today but he has been instructed to wear this when he is upright or sitting.  We did talk about the option of a vertebroplasty but his pain seems to be under control therefore he is not interested in this at this time.  I explained that if he continues to feel improvement from his injury he does not need to follow-up with me but if he has any concerns or worsening he needs to follow-up with me in about 4 to 6 weeks.  Questions encouraged and answered.  Follow-Up Instructions: Return if symptoms worsen or fail to improve.   Orders:  Orders Placed This Encounter  Procedures   XR Lumbar Spine 2-3 Views   No orders of the defined types were placed in this encounter.     Procedures: No procedures performed   Clinical Data: No additional findings.   Subjective: Chief Complaint  Patient presents with   [name] is a 55 year old gentleman who is well-known to me who comes in for evaluation and treatment of a compression fracture to L4 that he suffered from a seizure a month ago.  He has been taking Tylenol Profen.  Denies any leg pain, numbness or tingling, loss of bladder or bowel control.  He has pain throughout his low back.  He has Wilson's disease.   Review of Systems  Constitutional: Negative.   All other systems reviewed and are negative.   Objective: Vital Signs: There were no vitals taken for this visit.  Physical Exam Vitals and nursing note reviewed.  Constitutional:       Appearance: He is well-developed.  Pulmonary:     Effort: Pulmonary effort is normal.  Abdominal:     Palpations: Abdomen is soft.  Skin:    General: Skin is warm.  Neurological:     Mental Status: He is alert and oriented to person, place, and time.  Psychiatric:        Behavior: Behavior normal.        Thought Content: Thought content normal.        Judgment: Judgment normal.    Ortho Exam Lumbar spine is tender to palpation.  There is no step-offs or deformities.  No focal deficits of the lower extremities. Specialty Comments:  No specialty comments available.  Imaging: XR Lumbar Spine 2-3 Views  Result Date: 03/10/2021 L4 compression fracture with mild height loss    PMFS History: Patient Active Problem List   Diagnosis Date Noted   Left displaced femoral neck fracture ([place])    Closed fracture of left distal radius and ulna, initial encounter    Closed left hip fracture ([place]) 07/22/2019   Left wrist fracture 07/22/2019   Rib fracture 07/22/2019   Ascites 00/93/8182   Alcoholic cirrhosis of liver with ascites ([place]) 07/19/2018   Left hydrocele 07/19/2018  Umbilical hernia without obstruction and without gangrene 07/19/2018   Displaced fracture of distal end of right radius 12/23/2016   Possible exposure to STD 06/07/2016   S/P total knee replacement using cement 07/15/2015   Bipolar affective disorder ([place]) 05/27/2015   Depression 05/27/2015   Thrombocytopenia ([place]) 05/27/2015   History of alcohol abuse 05/27/2015   Seizure ([place]) 05/27/2015   Brain disorder 05/27/2015   Chest pain 04/30/2015   Hepatocellular carcinoma ([place]) 02/15/2015   Left inguinal hernia 10/22/2014   Hyponatremia 05/06/2014   Diarrhea 04/30/2014   Wilson's disease 09/08/2013   UTI (urinary tract infection) 07/29/2013   Fracture of tibial plateau 07/24/2013   ED (erectile dysfunction) of organic origin 01/07/2013   Hypokalemia 10/21/2011   Eczema 10/21/2011   Impaired glucose tolerance  10/19/2011   Hepatic encephalopathy ([place]) 08/05/2011   Disorder of stomach and duodenum 08/03/2011   Esophageal varices ([place]) 08/03/2011   Portal hypertension ([place]) 08/03/2011   Seizures ([place])    Encephalopathy    Preventative health care 04/14/2011   Follow-up examination following surgery 02/23/2011   Chronic liver disease and cirrhosis ([place]) 06/10/2009   Past Medical History:  Diagnosis Date   Abuse, drug or alcohol ([place])    Anemia    Bipolar affective disorder ([place])    [facility])    liver cancer treated with micrablation   Chronic liver disease and cirrhosis    Depression    DJD (degenerative joint disease)    right knee   Dyslipidemia    Encephalopathy    Esophageal varices ([facility])    Hernia    History of alcohol abuse    PONV (postoperative nausea and vomiting)    Seizures ([facility])    Dr Jannifer Franklin   Thrombocytopenia ([place])    Wilson disease    Wrist fracture    right    Family History  Problem Relation Age of Onset   Heart disease Other    Diabetes Father    Dementia Mother    Glaucoma Brother     Past Surgical History:  Procedure Laterality Date   ESOPHAGOGASTRODUODENOSCOPY     HARDWARE REMOVAL Right 01/22/2015   Procedure: HARDWARE REMOVAL RIGHT LEG;  Surgeon: Leandrew Koyanagi, MD;  Location: [facility];  Service: Orthopedics;  Laterality: Right;   IR PARACENTESIS  10/04/2018   IR PARACENTESIS  04/29/2019   left inguinal hernia  june 2012   [place]   liver cancer  02/2015   ablation   microablation of liver     OPEN REDUCTION INTERNAL FIXATION (ORIF) DISTAL RADIAL FRACTURE Right 12/21/2016   Procedure: OPEN REDUCTION INTERNAL FIXATION (ORIF) RIGHT DISTAL RADIUS FRACTURE;  Surgeon: Leandrew Koyanagi, MD;  Location: [facility];  Service: Orthopedics;  Laterality: Right;   OPEN REDUCTION INTERNAL FIXATION (ORIF) TIBIA/FIBULA FRACTURE Right 07/25/2013   Procedure: OPEN REDUCTION INTERNAL FIXATION (ORIF) RIGHT TIBIAL PLATEAU, TIBIAL SHAFT AND FIBULA  FRACTURES, POSSIBLE FASCIOTOMIES;  Surgeon: Marianna Payment, MD;  Location: [facility];  Service: Orthopedics;  Laterality: Right;   ORIF WRIST FRACTURE Left 07/23/2019   Procedure: OPEN REDUCTION INTERNAL FIXATION (ORIF) WRIST FRACTURE;  Surgeon: Leandrew Koyanagi, MD;  Location: [facility];  Service: Orthopedics;  Laterality: Left;   TOTAL HIP ARTHROPLASTY Left 07/23/2019   Procedure: TOTAL HIP ARTHROPLASTY ANTERIOR APPROACH;  Surgeon: Leandrew Koyanagi, MD;  Location: [facility];  Service: Orthopedics;  Laterality: Left;   TOTAL KNEE ARTHROPLASTY Right 07/15/2015   Procedure: RIGHT TOTAL  KNEE ARTHROPLASTY;  Surgeon: Leandrew Koyanagi, MD;  Location: [facility];  Service: Orthopedics;  Laterality: Right;   VASCULAR SURGERY     Social History   Occupational History   Occupation: disabled    Employer: [employer]  Tobacco Use   Smoking status: Former    Packs/day: 0.25    Pack years: 0.00    Types: Cigarettes   Smokeless tobacco: Never  Vaping Use   Vaping Use: Never used  Substance and Sexual Activity   Alcohol use: No    Alcohol/week: 0.0 standard drinks    Comment: 3-4 bottles a week, quit drinking 2005   Drug use: No    Comment: last use 2005   Sexual activity: Yes

## 2021-04-13 ENCOUNTER — Ambulatory Visit: Payer: 59 | Admitting: Adult Health

## 2021-05-03 ENCOUNTER — Other Ambulatory Visit: Payer: Self-pay | Admitting: Internal Medicine

## 2021-05-03 ENCOUNTER — Other Ambulatory Visit (HOSPITAL_COMMUNITY): Payer: Self-pay | Admitting: Internal Medicine

## 2021-05-03 DIAGNOSIS — K7031 Alcoholic cirrhosis of liver with ascites: Secondary | ICD-10-CM

## 2021-05-09 ENCOUNTER — Observation Stay (HOSPITAL_COMMUNITY): Payer: 59

## 2021-05-09 ENCOUNTER — Emergency Department (HOSPITAL_COMMUNITY): Payer: 59

## 2021-05-09 ENCOUNTER — Encounter (HOSPITAL_COMMUNITY): Payer: Self-pay | Admitting: Internal Medicine

## 2021-05-09 ENCOUNTER — Inpatient Hospital Stay (HOSPITAL_COMMUNITY)
Admission: EM | Admit: 2021-05-09 | Discharge: 2021-05-11 | DRG: 433 | Disposition: A | Discharge status: Home Health | Payer: 59 | Attending: Internal Medicine | Admitting: Internal Medicine

## 2021-05-09 ENCOUNTER — Other Ambulatory Visit: Payer: Self-pay

## 2021-05-09 DIAGNOSIS — K7290 Hepatic failure, unspecified without coma: Secondary | ICD-10-CM | POA: Diagnosis present

## 2021-05-09 DIAGNOSIS — E785 Hyperlipidemia, unspecified: Secondary | ICD-10-CM | POA: Diagnosis present

## 2021-05-09 DIAGNOSIS — R569 Unspecified convulsions: Secondary | ICD-10-CM

## 2021-05-09 DIAGNOSIS — Z96642 Presence of left artificial hip joint: Secondary | ICD-10-CM | POA: Diagnosis present

## 2021-05-09 DIAGNOSIS — Z9119 Patient's noncompliance with other medical treatment and regimen: Secondary | ICD-10-CM

## 2021-05-09 DIAGNOSIS — E876 Hypokalemia: Secondary | ICD-10-CM | POA: Diagnosis not present

## 2021-05-09 DIAGNOSIS — K7031 Alcoholic cirrhosis of liver with ascites: Secondary | ICD-10-CM | POA: Diagnosis not present

## 2021-05-09 DIAGNOSIS — R1011 Right upper quadrant pain: Secondary | ICD-10-CM

## 2021-05-09 DIAGNOSIS — R4182 Altered mental status, unspecified: Secondary | ICD-10-CM | POA: Diagnosis not present

## 2021-05-09 DIAGNOSIS — F17200 Nicotine dependence, unspecified, uncomplicated: Secondary | ICD-10-CM | POA: Diagnosis present

## 2021-05-09 DIAGNOSIS — C220 Liver cell carcinoma: Secondary | ICD-10-CM | POA: Diagnosis present

## 2021-05-09 DIAGNOSIS — K529 Noninfective gastroenteritis and colitis, unspecified: Secondary | ICD-10-CM | POA: Diagnosis not present

## 2021-05-09 DIAGNOSIS — F17210 Nicotine dependence, cigarettes, uncomplicated: Secondary | ICD-10-CM | POA: Diagnosis present

## 2021-05-09 DIAGNOSIS — Z885 Allergy status to narcotic agent status: Secondary | ICD-10-CM

## 2021-05-09 DIAGNOSIS — K766 Portal hypertension: Secondary | ICD-10-CM | POA: Diagnosis present

## 2021-05-09 DIAGNOSIS — Z83511 Family history of glaucoma: Secondary | ICD-10-CM

## 2021-05-09 DIAGNOSIS — Z833 Family history of diabetes mellitus: Secondary | ICD-10-CM

## 2021-05-09 DIAGNOSIS — D696 Thrombocytopenia, unspecified: Secondary | ICD-10-CM | POA: Diagnosis present

## 2021-05-09 DIAGNOSIS — I8510 Secondary esophageal varices without bleeding: Secondary | ICD-10-CM | POA: Diagnosis present

## 2021-05-09 DIAGNOSIS — Z8505 Personal history of malignant neoplasm of liver: Secondary | ICD-10-CM

## 2021-05-09 DIAGNOSIS — Z20822 Contact with and (suspected) exposure to covid-19: Secondary | ICD-10-CM | POA: Diagnosis present

## 2021-05-09 DIAGNOSIS — K7682 Hepatic encephalopathy: Secondary | ICD-10-CM

## 2021-05-09 DIAGNOSIS — G40909 Epilepsy, unspecified, not intractable, without status epilepticus: Secondary | ICD-10-CM | POA: Diagnosis present

## 2021-05-09 DIAGNOSIS — Z96651 Presence of right artificial knee joint: Secondary | ICD-10-CM | POA: Diagnosis present

## 2021-05-09 LAB — CBC WITH DIFFERENTIAL/PLATELET
Abs Immature Granulocytes: 0.1 10*3/uL — ABNORMAL HIGH (ref 0.00–0.07)
Basophils Absolute: 0 10*3/uL (ref 0.0–0.1)
Basophils Relative: 0 %
Eosinophils Absolute: 0.1 10*3/uL (ref 0.0–0.5)
Eosinophils Relative: 1 %
HCT: 38.6 % — ABNORMAL LOW (ref 39.0–52.0)
Hemoglobin: 12.4 g/dL — ABNORMAL LOW (ref 13.0–17.0)
Immature Granulocytes: 1 %
Lymphocytes Relative: 5 %
Lymphs Abs: 0.6 10*3/uL — ABNORMAL LOW (ref 0.7–4.0)
MCH: 34.5 pg — ABNORMAL HIGH (ref 26.0–34.0)
MCHC: 32.1 g/dL (ref 30.0–36.0)
MCV: 107.5 fL — ABNORMAL HIGH (ref 80.0–100.0)
Monocytes Absolute: 1.3 10*3/uL — ABNORMAL HIGH (ref 0.1–1.0)
Monocytes Relative: 13 %
Neutro Abs: 8.4 10*3/uL — ABNORMAL HIGH (ref 1.7–7.7)
Neutrophils Relative %: 80 %
Platelets: UNDETERMINED 10*3/uL (ref 150–400)
RBC: 3.59 MIL/uL — ABNORMAL LOW (ref 4.22–5.81)
RDW: 16.4 % — ABNORMAL HIGH (ref 11.5–15.5)
WBC: 10.5 10*3/uL (ref 4.0–10.5)
nRBC: 0 % (ref 0.0–0.2)

## 2021-05-09 LAB — AMMONIA: Ammonia: 64 umol/L — ABNORMAL HIGH (ref 9–35)

## 2021-05-09 LAB — PROTIME-INR
INR: 1.7 — ABNORMAL HIGH (ref 0.8–1.2)
Prothrombin Time: 20.3 seconds — ABNORMAL HIGH (ref 11.4–15.2)

## 2021-05-09 LAB — ETHANOL: Alcohol, Ethyl (B): <10 mg/dL (ref <10)

## 2021-05-09 LAB — URINALYSIS, ROUTINE W REFLEX MICROSCOPIC
Bilirubin Urine: NEGATIVE
Glucose, UA: NEGATIVE mg/dL
Hgb urine dipstick: NEGATIVE
Ketones, ur: NEGATIVE mg/dL
Leukocytes,Ua: NEGATIVE
Nitrite: NEGATIVE
Protein, ur: NEGATIVE mg/dL
Specific Gravity, Urine: 1.012 (ref 1.005–1.030)
pH: 7 (ref 5.0–8.0)

## 2021-05-09 LAB — COMPREHENSIVE METABOLIC PANEL
ALT: 141 U/L — ABNORMAL HIGH (ref 0–44)
AST: 134 U/L — ABNORMAL HIGH (ref 15–41)
Albumin: 2.2 g/dL — ABNORMAL LOW (ref 3.5–5.0)
Alkaline Phosphatase: 217 U/L — ABNORMAL HIGH (ref 38–126)
Anion gap: 12 (ref 5–15)
BUN: 13 mg/dL (ref 6–20)
CO2: 23 mmol/L (ref 22–32)
Calcium: 8.2 mg/dL — ABNORMAL LOW (ref 8.9–10.3)
Chloride: 95 mmol/L — ABNORMAL LOW (ref 98–111)
Creatinine, Ser: 0.86 mg/dL (ref 0.61–1.24)
GFR, Estimated: >60 mL/min (ref >60)
Glucose, Bld: 84 mg/dL (ref 70–99)
Potassium: 3.8 mmol/L (ref 3.5–5.1)
Sodium: 130 mmol/L — ABNORMAL LOW (ref 135–145)
Total Bilirubin: 8.7 mg/dL — ABNORMAL HIGH (ref 0.3–1.2)
Total Protein: 5.1 g/dL — ABNORMAL LOW (ref 6.5–8.1)

## 2021-05-09 LAB — RAPID URINE DRUG SCREEN, HOSP PERFORMED
Amphetamines: NOT DETECTED
Barbiturates: NOT DETECTED
Benzodiazepines: NOT DETECTED
Cocaine: NOT DETECTED
Opiates: NOT DETECTED
Tetrahydrocannabinol: NOT DETECTED

## 2021-05-09 LAB — SARS CORONAVIRUS 2 (TAT 6-24 HRS): SARS Coronavirus 2: NEGATIVE

## 2021-05-09 LAB — ACETAMINOPHEN LEVEL: Acetaminophen (Tylenol), Serum: <10 ug/mL — ABNORMAL LOW (ref 10–30)

## 2021-05-09 MED ORDER — ONDANSETRON HCL 4 MG/2ML IJ SOLN: 4.0000 mg | Freq: Four times a day (QID) | INTRAMUSCULAR | Status: DC | PRN

## 2021-05-09 MED ORDER — SPIRONOLACTONE 25 MG PO TABS
200.0000 mg | ORAL_TABLET | Freq: Every morning | ORAL | Status: DC
  Administered 2021-05-10 – 2021-05-11 (×2): 200 mg via ORAL
  Filled 2021-05-09 (×2): qty 2
  Filled 2021-05-09: qty 8

## 2021-05-09 MED ORDER — ACETAMINOPHEN 500 MG PO TABS: 500.0000 mg | ORAL_TABLET | Freq: Four times a day (QID) | ORAL | Status: DC | PRN

## 2021-05-09 MED ORDER — NICOTINE 14 MG/24HR TD PT24
14.0000 mg | MEDICATED_PATCH | Freq: Every day | TRANSDERMAL | Status: DC
  Filled 2021-05-09 (×2): qty 1

## 2021-05-09 MED ORDER — ACETAMINOPHEN 650 MG RE SUPP: 650.0000 mg | Freq: Four times a day (QID) | RECTAL | Status: DC | PRN

## 2021-05-09 MED ORDER — HYDRALAZINE HCL 20 MG/ML IJ SOLN: 5.0000 mg | INTRAMUSCULAR | Status: DC | PRN

## 2021-05-09 MED ORDER — PIPERACILLIN-TAZOBACTAM 3.375 G IVPB 30 MIN
3.3750 g | Freq: Once | INTRAVENOUS | Status: AC
  Administered 2021-05-10: 3.375 g via INTRAVENOUS
  Filled 2021-05-09: qty 50

## 2021-05-09 MED ORDER — FUROSEMIDE 40 MG PO TABS
80.0000 mg | ORAL_TABLET | Freq: Two times a day (BID) | ORAL | Status: DC
  Administered 2021-05-09 – 2021-05-11 (×4): 80 mg via ORAL
  Filled 2021-05-09: qty 2
  Filled 2021-05-09 (×2): qty 4
  Filled 2021-05-09: qty 2

## 2021-05-09 MED ORDER — POTASSIUM CHLORIDE CRYS ER 10 MEQ PO TBCR
10.0000 meq | EXTENDED_RELEASE_TABLET | Freq: Two times a day (BID) | ORAL | Status: DC
  Administered 2021-05-09 – 2021-05-10 (×3): 10 meq via ORAL
  Filled 2021-05-09 (×5): qty 1

## 2021-05-09 MED ORDER — LACTULOSE 10 GM/15ML PO SOLN
30.0000 g | Freq: Three times a day (TID) | ORAL | Status: DC
  Administered 2021-05-09 – 2021-05-11 (×5): 30 g via ORAL
  Filled 2021-05-09 (×2): qty 45
  Filled 2021-05-09 (×2): qty 60
  Filled 2021-05-09: qty 45

## 2021-05-09 MED ORDER — IOHEXOL 350 MG/ML SOLN
80.0000 mL | Freq: Once | INTRAVENOUS | Status: AC | PRN
  Administered 2021-05-09: 80 mL via INTRAVENOUS

## 2021-05-09 MED ORDER — ROPINIROLE HCL 1 MG PO TABS
0.5000 mg | ORAL_TABLET | Freq: Every day | ORAL | Status: DC
  Administered 2021-05-09 – 2021-05-10 (×2): 0.5 mg via ORAL
  Filled 2021-05-09 (×3): qty 1

## 2021-05-09 MED ORDER — LEVETIRACETAM 500 MG PO TABS
1500.0000 mg | ORAL_TABLET | Freq: Two times a day (BID) | ORAL | Status: DC
  Administered 2021-05-09 – 2021-05-11 (×4): 1500 mg via ORAL
  Filled 2021-05-09 (×4): qty 3

## 2021-05-09 MED ORDER — PIPERACILLIN-TAZOBACTAM 3.375 G IVPB
3.3750 g | Freq: Three times a day (TID) | INTRAVENOUS | Status: DC
  Administered 2021-05-10 (×2): 3.375 g via INTRAVENOUS
  Filled 2021-05-09 (×4): qty 50

## 2021-05-09 MED ORDER — ONDANSETRON HCL 4 MG PO TABS: 4.0000 mg | ORAL_TABLET | Freq: Four times a day (QID) | ORAL | Status: DC | PRN

## 2021-05-09 NOTE — ED Triage Notes (Addendum)
Pt bib [facility] from home. Per ems, pt lives with his friend, and friend called ems this morning d/t Altered mental status. Pt did not know how to flush the toilet thou the toilet has already been flushed.  LSN 2100 last night.  Pt was A& O X1 with ems ( was only able to tell who he was ).   VS ems:  - BP 130/64  - CBG 95  -O2 98% RA  -HR 86  -temp 97.6

## 2021-05-09 NOTE — ED Notes (Signed)
Patient transported to CT 

## 2021-05-09 NOTE — ED Provider Notes (Signed)
[facility] EMERGENCY DEPARTMENT Provider Note   CSN: [id number] Arrival date & time: 05/09/21  1230     History Chief Complaint  Patient presents with   Altered Mental Status    Darryal Gotte is a 55 y.o. male. Level 5 caveat due to altered mental status.  Altered Mental Status Patient brought in with mental status change.  Reportedly has been confused and not know how to flush the toilet.  Patient cannot really tell me what happened but states he knows he was confused.  History of liver disease.  Patient cannot tell me about it but reviewing it appears as if he has hepatocellular carcinoma.  Has had some recent microwave ablation.    Past Medical History:  Diagnosis Date   Abuse, drug or alcohol ([place])    Anemia    Bipolar affective disorder ([place])    [place] [facility])    liver cancer treated with micrablation   Chronic liver disease and cirrhosis    Depression    DJD (degenerative joint disease)    right knee   Dyslipidemia    Encephalopathy    Esophageal varices ([place])    Hernia    History of alcohol abuse    PONV (postoperative nausea and vomiting)    Seizures ([place])    Dr Jannifer Franklin   Thrombocytopenia ([place])    Wilson disease    Wrist fracture    right    Patient Active Problem List   Diagnosis Date Noted   Left displaced femoral neck fracture ([place])    Closed fracture of left distal radius and ulna, initial encounter    Closed left hip fracture ([place]) 07/22/2019   Left wrist fracture 07/22/2019   Rib fracture 07/22/2019   Ascites [id number]   Alcoholic cirrhosis of liver with ascites ([place]) 07/19/2018   Left hydrocele 0000000   Umbilical hernia without obstruction and without gangrene 07/19/2018   Displaced fracture of distal end of right radius 12/23/2016   Possible exposure to STD 06/07/2016   S/P total knee replacement using cement 07/15/2015   Bipolar affective disorder ([place]) 05/27/2015   Depression  05/27/2015   Thrombocytopenia ([place]) 05/27/2015   History of alcohol abuse 05/27/2015   Seizure ([place]) 05/27/2015   Brain disorder 05/27/2015   Chest pain 04/30/2015   Hepatocellular carcinoma ([place]) 02/15/2015   Left inguinal hernia 10/22/2014   Hyponatremia 05/06/2014   Diarrhea 04/30/2014   Wilson's disease 09/08/2013   UTI (urinary tract infection) 07/29/2013   Fracture of tibial plateau 07/24/2013   ED (erectile dysfunction) of organic origin 01/07/2013   Hypokalemia 10/21/2011   Eczema 10/21/2011   Impaired glucose tolerance 10/19/2011   Hepatic encephalopathy ([place]) 08/05/2011   Disorder of stomach and duodenum 08/03/2011   Esophageal varices ([place]) 08/03/2011   Portal hypertension ([place]) 08/03/2011   Seizures ([place])    Encephalopathy    Preventative health care 04/14/2011   Follow-up examination following surgery 02/23/2011   Chronic liver disease and cirrhosis ([place]) 06/10/2009    Past Surgical History:  Procedure Laterality Date   ESOPHAGOGASTRODUODENOSCOPY     HARDWARE REMOVAL Right 01/22/2015   Procedure: HARDWARE REMOVAL RIGHT LEG;  Surgeon: Leandrew Koyanagi, MD;  Location: [place];  Service: Orthopedics;  Laterality: Right;   IR PARACENTESIS  10/04/2018   IR PARACENTESIS  04/29/2019   left inguinal hernia  june 2012   [place]   liver cancer  02/2015   ablation   microablation of  liver     OPEN REDUCTION INTERNAL FIXATION (ORIF) DISTAL RADIAL FRACTURE Right 12/21/2016   Procedure: OPEN REDUCTION INTERNAL FIXATION (ORIF) RIGHT DISTAL RADIUS FRACTURE;  Surgeon: Leandrew Koyanagi, MD;  Location: [place];  Service: Orthopedics;  Laterality: Right;   OPEN REDUCTION INTERNAL FIXATION (ORIF) TIBIA/FIBULA FRACTURE Right 07/25/2013   Procedure: OPEN REDUCTION INTERNAL FIXATION (ORIF) RIGHT TIBIAL PLATEAU, TIBIAL SHAFT AND FIBULA FRACTURES, POSSIBLE FASCIOTOMIES;  Surgeon: Marianna Payment, MD;  Location: [place];  Service: Orthopedics;  Laterality: Right;   ORIF WRIST FRACTURE Left  07/23/2019   Procedure: OPEN REDUCTION INTERNAL FIXATION (ORIF) WRIST FRACTURE;  Surgeon: Leandrew Koyanagi, MD;  Location: [place];  Service: Orthopedics;  Laterality: Left;   TOTAL HIP ARTHROPLASTY Left 07/23/2019   Procedure: TOTAL HIP ARTHROPLASTY ANTERIOR APPROACH;  Surgeon: Leandrew Koyanagi, MD;  Location: [place];  Service: Orthopedics;  Laterality: Left;   TOTAL KNEE ARTHROPLASTY Right 07/15/2015   Procedure: RIGHT TOTAL KNEE ARTHROPLASTY;  Surgeon: Leandrew Koyanagi, MD;  Location: [place];  Service: Orthopedics;  Laterality: Right;   VASCULAR SURGERY         Family History  Problem Relation Age of Onset   Heart disease Other    Diabetes Father    Dementia Mother    Glaucoma Brother     Social History   Tobacco Use   Smoking status: Every Day    Packs/day: 1.00    Types: Cigarettes   Smokeless tobacco: Never  Vaping Use   Vaping Use: Never used  Substance Use Topics   Alcohol use: No    Alcohol/week: 0.0 standard drinks    Comment: 3-4 bottles a week, quit drinking 2005   Drug use: No    Comment: last use 2005    Home Medications Prior to Admission medications   Medication Sig Start Date End Date Taking? Authorizing Provider  furosemide (LASIX) 40 MG tablet Take 2 tablets (80 mg total) by mouth 2 (two) times daily. 04/29/19   Biagio Borg, MD  lactulose [facility]) 10 GM/15ML solution Take 20 g by mouth daily. 09/10/19   [provider]  levETIRAcetam (KEPPRA) 500 MG tablet Take 3 tablets (1,500 mg total) by mouth 2 (two) times daily. 12/20/20   Cameron Sprang, MD  potassium chloride (KLOR-CON) 10 MEQ tablet TAKE ONE TABLET BY MOUTH TWICE DAILY 01/27/20   Biagio Borg, MD  rOPINIRole (REQUIP) 0.5 MG tablet Take 0.5 mg by mouth at bedtime. 10/24/19   [provider]  spironolactone (ALDACTONE) 100 MG tablet Take 2 tablets (200 mg total) by mouth every morning. 04/29/19   Biagio Borg, MD  zinc gluconate 50 MG tablet Take 50 mg by mouth 3 (three) times daily.      [provider]    Allergies    Codeine  Review of Systems   Review of Systems  Unable to perform ROS: Mental status change   Physical Exam Updated Vital Signs BP 126/69   Pulse 89   Temp 98.6 F (37 C) (Oral)   Resp (!) 24   SpO2 99%   Physical Exam Vitals and nursing note reviewed.  HENT:     Head: Normocephalic and atraumatic.  Eyes:     General: No scleral icterus.    Pupils: Pupils are equal, round, and reactive to light.  Cardiovascular:     Rate and Rhythm: Regular rhythm.  Pulmonary:     Breath sounds: No wheezing or rhonchi.  Abdominal:  Comments: Mild swelling of abdomen.  Musculoskeletal:        General: No tenderness.     Cervical back: Neck supple.  Skin:    General: Skin is warm.     Capillary Refill: Capillary refill takes less than 2 seconds.  Neurological:     Mental Status: He is alert.     Comments: Awake and oriented to self.  Able to tell me his name but not able to give much other history.    ED Results / Procedures / Treatments   Labs (all labs ordered are listed, but only abnormal results are displayed) Labs Reviewed  PROTIME-INR - Abnormal; Notable for the following components:      Result Value   Prothrombin Time 20.3 (*)    INR 1.7 (*)    All other components within normal limits  CBC WITH DIFFERENTIAL/PLATELET - Abnormal; Notable for the following components:   RBC 3.59 (*)    Hemoglobin 12.4 (*)    HCT 38.6 (*)    MCV 107.5 (*)    MCH 34.5 (*)    RDW 16.4 (*)    Neutro Abs 8.4 (*)    Lymphs Abs 0.6 (*)    Monocytes Absolute 1.3 (*)    Abs Immature Granulocytes 0.10 (*)    All other components within normal limits  COMPREHENSIVE METABOLIC PANEL - Abnormal; Notable for the following components:   Sodium 130 (*)    Chloride 95 (*)    Calcium 8.2 (*)    Total Protein 5.1 (*)    Albumin 2.2 (*)    AST 134 (*)    ALT 141 (*)    Alkaline Phosphatase 217 (*)    Total Bilirubin 8.7 (*)    All other components  within normal limits  AMMONIA - Abnormal; Notable for the following components:   Ammonia 64 (*)    All other components within normal limits  ACETAMINOPHEN LEVEL - Abnormal; Notable for the following components:   Acetaminophen (Tylenol), Serum <10 (*)    All other components within normal limits  ETHANOL  URINALYSIS, ROUTINE W REFLEX MICROSCOPIC  RAPID URINE DRUG SCREEN, HOSP PERFORMED    EKG None  Radiology CT HEAD WO CONTRAST (5MM)  Result Date: 05/09/2021 CLINICAL DATA:  Delirium, altered mental status EXAM: CT HEAD WITHOUT CONTRAST TECHNIQUE: Contiguous axial images were obtained from the base of the skull through the vertex without intravenous contrast. COMPARISON:  07/21/2019, 11/29/2008 FINDINGS: Brain: Stable mild atrophy pattern. No acute intracranial hemorrhage, mass lesion, acute infarction, midline shift, herniation, hydrocephalus, or extra-axial fluid collection. Cisterns are patent. Cerebellar atrophy as well. Vascular: No hyperdense vessel or unexpected calcification. Skull: Normal. Negative for fracture or focal lesion. Sinuses/Orbits: No acute finding. Other: None. IMPRESSION: Stable atrophy pattern. No acute intracranial abnormality by noncontrast CT. Electronically Signed   By: Jerilynn Mages.  Shick M.D.   On: 05/09/2021 14:48   DG Chest Portable 1 View  Result Date: 05/09/2021 CLINICAL DATA:  Altered mental status, history cirrhosis EXAM: PORTABLE CHEST 1 VIEW COMPARISON:  Portable exam 1345 hours compared to 07/21/2019 FINDINGS: Normal heart size, mediastinal contours, and pulmonary vascularity. Lungs clear. No infiltrate, pleural effusion, or pneumothorax. No acute osseous findings. IMPRESSION: No acute abnormalities. Electronically Signed   By: Lavonia Norvin M.D.   On: 05/09/2021 14:03    Procedures Procedures   Medications Ordered in ED Medications - No data to display  ED Course  I have reviewed the triage vital signs and the nursing notes.  Pertinent labs & imaging  results that were available during my care of the patient were reviewed by me and considered in my medical decision making (see chart for details).    MDM Rules/Calculators/A&P                           Patient presents with mental status change.  Patient really not tell me what happened.  However reviewing records appears to have had recent ablation of liver [facility].  Reviewing records it appears his AST was 83 ALT was 70 and total bili was 3.3 about a month ago.  INR at that time was 1.36.  Now all of this has worsened.  Ammonia is up.  Head CT reassuring.  I think this is likely hepatic encephalopathy worsened potentially due to recent procedure.  Benign abdominal exam overall.  Discussed with [facility] for transfer but they are diverting medical transfer and cannot take any.  Will admit to unassigned medicine since PCP is out of town.  Discussed with [facility].  They feel comfortable managing him for now.  No imaging necessary needed but if feel as if we need it would probably start with an ultrasound. Discussed with hospitalist, Dr. Lorin Mercy.  Will see patient.  Final Clinical Impression(s) / ED Diagnoses Final diagnoses:  Hepatic encephalopathy ([place])  RUQ pain    Rx / DC Orders ED Discharge Orders     None        Davonna Belling, MD 05/09/21 [id number]

## 2021-05-09 NOTE — H&P (Signed)
History and Physical    [name] [id number] DOB: 09/24/1965 DOA: 05/09/2021  PCP: Joel Nakai, MD Consultants:  [facility] Oiler - heme/onc; [name] - neurology Patient coming from:  Home - lives with friend; NOK: [name], [phone]  Chief Complaint: AMS  HPI: [name] is a 55 y.o. male with medical history significant of bipolar; polysubstance abuse; cirrhosis with h/o encephalopathy, varices, and hepatocellular carcinoma s/p TACE; HLD;  and seizures presenting with AMS.  He had a surgery at [name] on 8/30 with lap ablation of recurrent HCC.  1.5L of ascites fluid was removed and US showed a 3.4 cm tumor.  Ablations were performed to the tumor to a depth of 8 cm with track coagulation.  Patient and his roommate report that he was doing well through last night.  He went to bed about 830-9pm (his usual time) but was confused upon awakening this AM. He went to the bathroom but called his roommate for assistance; he was standing in front of the commode with his underwear at his feet and couldn't remember how to flush the commode or wipe his bottom.  He remains confused, recognizes that he is confused, but feels maybe less so than earlier.   He has stopped taking Lactulose - he is having regular BMs so did not think he needed to keep taking it.   He doesn't remember the last time he took it.    ED Course: Should go to [facility] but they aren't taking patients.  Recent T[facility] at [facility]  Appears to be hepatic encephalopathy.  Does not need imaging, no suspicion for bleeding.  Review of Systems: As per HPI; otherwise review of systems reviewed and negative.   Ambulatory Status:  Ambulates without assistance   Past Medical History:  Diagnosis Date   Abuse, drug or alcohol ([place])    Anemia    Bipolar affective disorder ([place])    [facility])    liver cancer treated with micrablation   Chronic liver disease and cirrhosis    Depression     DJD (degenerative joint disease)    right knee   Dyslipidemia    Encephalopathy    Esophageal varices (HCC)    Hernia    History of alcohol abuse    PONV (postoperative nausea and vomiting)    Seizures ([name])    Dr [name]   Thrombocytopenia ([place])    Wilson disease    Wrist fracture    right    Past Surgical History:  Procedure Laterality Date   ESOPHAGOGASTRODUODENOSCOPY     HARDWARE REMOVAL Right 01/22/2015   Procedure: HARDWARE REMOVAL RIGHT LEG;  Surgeon: Joel Koyanagi, MD;  Location: [place];  Service: Orthopedics;  Laterality: Right;   IR PARACENTESIS  10/04/2018   IR PARACENTESIS  04/29/2019   left inguinal hernia  june 2012   [place]   liver cancer  02/2015   ablation   microablation of liver     OPEN REDUCTION INTERNAL FIXATION (ORIF) DISTAL RADIAL FRACTURE Right 12/21/2016   Procedure: OPEN REDUCTION INTERNAL FIXATION (ORIF) RIGHT DISTAL RADIUS FRACTURE;  Surgeon: [facility] Koyanagi, MD;  Location: [place];  Service: Orthopedics;  Laterality: Right;   OPEN REDUCTION INTERNAL FIXATION (ORIF) TIBIA/FIBULA FRACTURE Right 07/25/2013   Procedure: OPEN REDUCTION INTERNAL FIXATION (ORIF) RIGHT TIBIAL PLATEAU, TIBIAL SHAFT AND FIBULA FRACTURES, POSSIBLE FASCIOTOMIES;  Surgeon: Joel Payment, MD;  Location: [place];  Service: Orthopedics;  Laterality:  Right;   ORIF WRIST FRACTURE Left 07/23/2019   Procedure: OPEN REDUCTION INTERNAL FIXATION (ORIF) WRIST FRACTURE;  Surgeon: [facility] Koyanagi, MD;  Location: [place];  Service: Orthopedics;  Laterality: Left;   TOTAL HIP ARTHROPLASTY Left 07/23/2019   Procedure: TOTAL HIP ARTHROPLASTY ANTERIOR APPROACH;  Surgeon: [facility] Koyanagi, MD;  Location: [place];  Service: Orthopedics;  Laterality: Left;   TOTAL KNEE ARTHROPLASTY Right 07/15/2015   Procedure: RIGHT TOTAL KNEE ARTHROPLASTY;  Surgeon: [facility] Koyanagi, MD;  Location: [place];  Service: Orthopedics;  Laterality: Right;   VASCULAR SURGERY      Social History   Socioeconomic History   Marital  status: Divorced    Spouse name: Not on file   Number of children: 2   Years of education: HS   Highest education level: Not on file  Occupational History   Occupation: disabled    Employer: [employer]  Tobacco Use   Smoking status: Every Day    Packs/day: 1.00    Types: Cigarettes   Smokeless tobacco: Never  Vaping Use   Vaping Use: Never used  Substance and Sexual Activity   Alcohol use: No    Alcohol/week: 0.0 standard drinks    Comment: 3-4 bottles a week, quit drinking 2005   Drug use: No    Comment: last use 2005   Sexual activity: Yes  Other Topics Concern   Not on file  Social History Narrative   Patient is right handed.   Patient drinks some caffeine daily.   Lives in a one story home.   Social Determinants of Health   Financial Resource Strain: Not on file  Food Insecurity: Not on file  Transportation Needs: Not on file  Physical Activity: Not on file  Stress: Not on file  Social Connections: Not on file  Intimate Partner Violence: Not on file    Allergies  Allergen Reactions   Codeine Nausea And Vomiting and Other (See Comments)    Reports it makes me crazy.     Family History  Problem Relation Age of Onset   Heart disease Other    Diabetes Father    Dementia Mother    Glaucoma Brother     Prior to Admission medications   Medication Sig Start Date End Date Taking? Authorizing Provider  furosemide (LASIX) 40 MG tablet Take 2 tablets (80 mg total) by mouth 2 (two) times daily. 04/29/19   Biagio Borg, MD  lactulose [facility]) 10 GM/15ML solution Take 20 g by mouth daily. 09/10/19   [provider]  levETIRAcetam (KEPPRA) 500 MG tablet Take 3 tablets (1,500 mg total) by mouth 2 (two) times daily. 12/20/20   Cameron Sprang, MD  potassium chloride (KLOR-CON) 10 MEQ tablet TAKE ONE TABLET BY MOUTH TWICE DAILY 01/27/20   Biagio Borg, MD  rOPINIRole (REQUIP) 0.5 MG tablet Take 0.5 mg by mouth at bedtime. 10/24/19   [provider]   spironolactone (ALDACTONE) 100 MG tablet Take 2 tablets (200 mg total) by mouth every morning. 04/29/19   Biagio Borg, MD  zinc gluconate 50 MG tablet Take 50 mg by mouth 3 (three) times daily.     [provider]    Physical Exam: Vitals:   05/09/21 1406 05/09/21 1530 05/09/21 1619 05/09/21 1700  BP:  130/62 126/69 134/67  Pulse: 79 81 89 85  Resp: 19 20 (!) 24 14  Temp:      TempSrc:      SpO2: 97% 96% 99%  100%     General:  Appears calm and comfortable and is in NAD Eyes:  PERRL, EOMI, normal lids, iris; marked scleral icterus ENT:  grossly normal hearing, lips & tongue, mmm Neck:  no LAD, masses or thyromegaly Cardiovascular:  RRR, no m/r/g. No LE edema.  Respiratory:   CTA bilaterally with no wheezes/rales/rhonchi.  Normal respiratory effort. Abdomen:  soft, RUQ and RLQ TTP, + ascites (mild to moderate) Skin:  no rash or induration seen on limited exam; +jaundice, brawny skin tone Musculoskeletal:  grossly normal tone BUE/BLE, good ROM, no bony abnormality Psychiatric:  mildly confused mood and affect (couldn't remember the word "telephone" when looking at it, couldn't remember the code to unlock his phone - his DOB), speech fluent and appropriate, AOx2 - no clue about year and thought it might be [facility] Neurologic:  CN 2-12 grossly intact, moves all extremities in coordinated fashion    Radiological Exams on Admission: Independently reviewed - see discussion in A/P where applicable  CT HEAD WO CONTRAST (5MM)  Result Date: 05/09/2021 CLINICAL DATA:  Delirium, altered mental status EXAM: CT HEAD WITHOUT CONTRAST TECHNIQUE: Contiguous axial images were obtained from the base of the skull through the vertex without intravenous contrast. COMPARISON:  07/21/2019, 11/29/2008 FINDINGS: Brain: Stable mild atrophy pattern. No acute intracranial hemorrhage, mass lesion, acute infarction, midline shift, herniation, hydrocephalus, or extra-axial fluid collection. Cisterns  are patent. Cerebellar atrophy as well. Vascular: No hyperdense vessel or unexpected calcification. Skull: Normal. Negative for fracture or focal lesion. Sinuses/Orbits: No acute finding. Other: None. IMPRESSION: Stable atrophy pattern. No acute intracranial abnormality by noncontrast CT. Electronically Signed   By: Jerilynn Mages.  Shick M.D.   On: 05/09/2021 14:48   DG Chest Portable 1 View  Result Date: 05/09/2021 CLINICAL DATA:  Altered mental status, history cirrhosis EXAM: PORTABLE CHEST 1 VIEW COMPARISON:  Portable exam 1345 hours compared to 07/21/2019 FINDINGS: Normal heart size, mediastinal contours, and pulmonary vascularity. Lungs clear. No infiltrate, pleural effusion, or pneumothorax. No acute osseous findings. IMPRESSION: No acute abnormalities. Electronically Signed   By: Lavonia Tupac M.D.   On: 05/09/2021 14:03    EKG: Independently reviewed.  NSR with rate 84; nonspecific ST changes with no evidence of acute ischemia   Labs on Admission: I have personally reviewed the available labs and imaging studies at the time of the admission.  Pertinent labs:   Na++ 130 AP 217 Albumin 2.2 AST 134/ALT 141/Bili 8.7; 77/64/3.2 on 8/8 NH4 64 WBC 10.5 Hgb 12.4 INR 1.7; 1.36 on 8/8 APAP <10 ETOH <10   Assessment/Plan Principal Problem:   Hepatic encephalopathy (HCC) Active Problems:   Seizures (HCC)   Alcoholic cirrhosis of liver with ascites (HCC)   Hepatocellular carcinoma (HCC)   Tobacco dependence   Hepatic encephalopathy -Patient with known alcoholic cirrhosis which has been decompensated with ascites and esophageal varices; also with [place] (see below) -Presenting with acute onset of AMS this morning -Ammonia 64 -He does appear to be encephalopathic with waxing and waning mental status -Family reports no recent changes to any medications -No apparent s/sx of infection  -Precipitant of encephalopathy could be his recent surgical procedure and so further imaging with [place] is essential; if  negative for acute findings, will observe at [facility] but if positive he will be transferred to [facility] ER/surgery.  I called to review with radiology, Dr. Joelyn Oms - she recommends CT to evaluate for bleeding complication.   -Since CT is unlikely to be completed prior to the end of my  shift, I will place admission orders for now pending the CT result and have asked Dr. Tomi Bamberger to ensure that there is no surgical complication requiring ER:ER transfer to [facility] prior to admission. -He does have apparent hepatocellular CA (see below), which can also lead to encephalopathy and he is non-compliant with lactulose. -He does have AMS and needs empiric treatment for encephalopathy with lactulose titrated to 2-4 soft stools/day; even if he is having regular stools daily, he still needs this medication and this was explained to the patient and his friend -Would anticipate improvement in mental status in the next 12-24 hours; will follow with physical/neurologic exam rather than serial ammonia levels as per guidelines -If not improving, he likely needs a more extensive evaluation for causes of his AMS -It is important to maintain dietary protein intake of 1.2-1.5g/kg/day; will request nutrition consult    Cirrhosis -MELD/MELD-Na score is 25, with an estimated 90-day mortality rate of 15% -He is not a candidate for liver transplant at this time since he is actively smoking -Has paracentesis at the time of his ablation on 8/30 -Continue diuretics  -Remote h/o drinking  HCC, recurrent -s/p TACE previously and then again on 8/30 -Appeared to tolerate the procedure well -Current decompensation may be a result of this procedure, but could also be a result of the [facility] itself or simply non-compliance with Lactulose -Needs f/u at [facility] as scheduled  Seizure d/o -Continue Keppra  Tobacco dependence -Tobacco Dependence: encourage cessation.   -This was discussed with the patient and should be reviewed on an ongoing basis.   -He  understands that smoking precludes him from being a candidate for liver transplantation. -Patch ordered      Note: This patient has been tested and is pending for the novel coronavirus COVID-19.   Level of care:  DVT prophylaxis: SCDs Code Status:  Full - confirmed with patient/family Family Communication: Friend was present throughout evaluation Disposition Plan:  The patient is from: home  Anticipated d/c is to: home without [facility] services   Anticipated d/c date will depend on clinical response to treatment, but possibly as early as tomorrow if he has excellent response to treatment  Patient is currently: acutely ill Consults called: GI  Admission status:  It is my clinical opinion that referral for OBSERVATION is reasonable and necessary in this patient based on the above information provided. The aforementioned taken together are felt to place the patient at high risk for further clinical deterioration. However it is anticipated that the patient may be medically stable for discharge from the [facility] within 24 to 48 hours.    Karmen Bongo MD [facility]   How to contact the [facility] Attending or Consulting provider Kenneth or covering provider during after hours [place], for this patient?  Check the care team in [facility] and look for a) attending/consulting TR[facility] provider listed and b) the [facility] team listed Log into [email or web address] and use [place]'s universal password to access. If you do not have the password, please contact the [facility] operator. Locate the [facility] provider you are looking for under [facility] and page to a number that you can be directly reached. If you still have difficulty reaching the provider, please page the [facility] (Director on Call) for the Hospitalists listed on amion for assistance.   05/09/2021, 5:30 PM

## 2021-05-09 NOTE — ED Provider Notes (Addendum)
I was made aware of significantly abnormal CT findings on this hospitalist patient boarding in the emergency department.  I have passed these along to Dr. Tonie Griffith, who will consult general surgery.   Sherwood Gambler, MD 05/09/21 2211  I did personally examined patient and his vitals are unremarkable and he is not ill-appearing.  He does have some lower abdominal tenderness but no peritonitis or rigidity/guarding.  We will add on a dose of antibiotics while sorting out what is going on with his abdomen.   Sherwood Gambler, MD 05/09/21 2238

## 2021-05-10 ENCOUNTER — Inpatient Hospital Stay (HOSPITAL_COMMUNITY): Payer: 59

## 2021-05-10 DIAGNOSIS — D696 Thrombocytopenia, unspecified: Secondary | ICD-10-CM | POA: Diagnosis present

## 2021-05-10 DIAGNOSIS — E876 Hypokalemia: Secondary | ICD-10-CM | POA: Diagnosis not present

## 2021-05-10 DIAGNOSIS — K529 Noninfective gastroenteritis and colitis, unspecified: Secondary | ICD-10-CM | POA: Diagnosis not present

## 2021-05-10 DIAGNOSIS — Z8505 Personal history of malignant neoplasm of liver: Secondary | ICD-10-CM | POA: Diagnosis not present

## 2021-05-10 DIAGNOSIS — K7290 Hepatic failure, unspecified without coma: Secondary | ICD-10-CM | POA: Diagnosis present

## 2021-05-10 DIAGNOSIS — Z833 Family history of diabetes mellitus: Secondary | ICD-10-CM | POA: Diagnosis not present

## 2021-05-10 DIAGNOSIS — Z20822 Contact with and (suspected) exposure to covid-19: Secondary | ICD-10-CM | POA: Diagnosis present

## 2021-05-10 DIAGNOSIS — I8510 Secondary esophageal varices without bleeding: Secondary | ICD-10-CM | POA: Diagnosis present

## 2021-05-10 DIAGNOSIS — R4182 Altered mental status, unspecified: Secondary | ICD-10-CM | POA: Diagnosis present

## 2021-05-10 DIAGNOSIS — Z885 Allergy status to narcotic agent status: Secondary | ICD-10-CM | POA: Diagnosis not present

## 2021-05-10 DIAGNOSIS — F17210 Nicotine dependence, cigarettes, uncomplicated: Secondary | ICD-10-CM | POA: Diagnosis present

## 2021-05-10 DIAGNOSIS — Z96651 Presence of right artificial knee joint: Secondary | ICD-10-CM | POA: Diagnosis present

## 2021-05-10 DIAGNOSIS — K7031 Alcoholic cirrhosis of liver with ascites: Secondary | ICD-10-CM | POA: Diagnosis present

## 2021-05-10 DIAGNOSIS — Z9119 Patient's noncompliance with other medical treatment and regimen: Secondary | ICD-10-CM | POA: Diagnosis not present

## 2021-05-10 DIAGNOSIS — Z83511 Family history of glaucoma: Secondary | ICD-10-CM | POA: Diagnosis not present

## 2021-05-10 DIAGNOSIS — K766 Portal hypertension: Secondary | ICD-10-CM | POA: Diagnosis present

## 2021-05-10 DIAGNOSIS — E785 Hyperlipidemia, unspecified: Secondary | ICD-10-CM | POA: Diagnosis present

## 2021-05-10 DIAGNOSIS — G40909 Epilepsy, unspecified, not intractable, without status epilepticus: Secondary | ICD-10-CM | POA: Diagnosis present

## 2021-05-10 DIAGNOSIS — Z96642 Presence of left artificial hip joint: Secondary | ICD-10-CM | POA: Diagnosis present

## 2021-05-10 HISTORY — PX: IR PARACENTESIS: IMG2679

## 2021-05-10 LAB — BODY FLUID CELL COUNT WITH DIFFERENTIAL
Eos, Fluid: 0 %
Lymphs, Fluid: 27 %
Monocyte-Macrophage-Serous Fluid: 31 % — ABNORMAL LOW (ref 50–90)
Neutrophil Count, Fluid: 42 % — ABNORMAL HIGH (ref 0–25)
Total Nucleated Cell Count, Fluid: 635 cu mm (ref 0–1000)

## 2021-05-10 LAB — CBC
HCT: 35.2 % — ABNORMAL LOW (ref 39.0–52.0)
Hemoglobin: 11.9 g/dL — ABNORMAL LOW (ref 13.0–17.0)
MCH: 35 pg — ABNORMAL HIGH (ref 26.0–34.0)
MCHC: 33.8 g/dL (ref 30.0–36.0)
MCV: 103.5 fL — ABNORMAL HIGH (ref 80.0–100.0)
Platelets: 69 10*3/uL — ABNORMAL LOW (ref 150–400)
RBC: 3.4 MIL/uL — ABNORMAL LOW (ref 4.22–5.81)
RDW: 16.4 % — ABNORMAL HIGH (ref 11.5–15.5)
WBC: 7.9 10*3/uL (ref 4.0–10.5)
nRBC: 0 % (ref 0.0–0.2)

## 2021-05-10 LAB — COMPREHENSIVE METABOLIC PANEL
ALT: 116 U/L — ABNORMAL HIGH (ref 0–44)
AST: 100 U/L — ABNORMAL HIGH (ref 15–41)
Albumin: 2 g/dL — ABNORMAL LOW (ref 3.5–5.0)
Alkaline Phosphatase: 200 U/L — ABNORMAL HIGH (ref 38–126)
Anion gap: 5 (ref 5–15)
BUN: 10 mg/dL (ref 6–20)
CO2: 25 mmol/L (ref 22–32)
Calcium: 8.3 mg/dL — ABNORMAL LOW (ref 8.9–10.3)
Chloride: 102 mmol/L (ref 98–111)
Creatinine, Ser: 0.84 mg/dL (ref 0.61–1.24)
GFR, Estimated: >60 mL/min (ref >60)
Glucose, Bld: 118 mg/dL — ABNORMAL HIGH (ref 70–99)
Potassium: 3.3 mmol/L — ABNORMAL LOW (ref 3.5–5.1)
Sodium: 132 mmol/L — ABNORMAL LOW (ref 135–145)
Total Bilirubin: 8.2 mg/dL — ABNORMAL HIGH (ref 0.3–1.2)
Total Protein: 4.8 g/dL — ABNORMAL LOW (ref 6.5–8.1)

## 2021-05-10 LAB — PROTIME-INR
INR: 1.7 — ABNORMAL HIGH (ref 0.8–1.2)
Prothrombin Time: 20.1 seconds — ABNORMAL HIGH (ref 11.4–15.2)

## 2021-05-10 LAB — HIV ANTIBODY (ROUTINE TESTING W REFLEX): HIV Screen 4th Generation wRfx: NONREACTIVE

## 2021-05-10 MED ORDER — LIDOCAINE HCL 1 % IJ SOLN
INTRAMUSCULAR | Status: AC
  Filled 2021-05-10: qty 20

## 2021-05-10 MED ORDER — LIDOCAINE HCL 1 % IJ SOLN
INTRAMUSCULAR | Status: DC | PRN
Start: 2021-05-10 — End: 2021-05-11
  Administered 2021-05-10: 10 mL

## 2021-05-10 NOTE — Consult Note (Signed)
Dalton Curtiss Fitting 09-Jul-1966  MD:[id number].    Requesting MD: Dr. Tonie Griffith Chief Complaint/Reason for Consult: pneumoperitoneum, concern for bowel perforation  HPI:  Mr. Schlesser is a 55 year old male with [place] in the setting of cirrhosis.  Initially underwent a laparoscopic ablation of his [place] in 2016 at [facility].  He later developed a recurrence and had a TACE earlier this year.  He then had progression of the lesion, and recently underwent a laparoscopic microwave ablation by Dr. Dory Horn at [facility] on 05/03/2021.  He also had 2 L of ascites evacuated at that time.   This morning he presented to the [facility] ED with confusion which he says began last night.  Labs are significant for a bilirubin of 8 (from 3 in early August), as well as an ammonia of 64.  His white blood cell count is normal, and he is afebrile and hemodynamically stable.  He underwent a CT scan as part of his work-up, which showed intra-abdominal free fluid, small volume free air, and thickening of the right colon.  General surgery was consulted to evaluate for an underlying bowel perforation.  At the time of my exam the patient is alert and oriented, and denies any abdominal pain.  He has not had any fevers or chills at home, and reports he has been eating without difficulty.  He says it has been at least a few days since his last bowel movement and denies any blood in his stools.  He has not been taking lactulose at home for the last few days.  ROS: Review of Systems  Constitutional:  Negative for chills and fever.  HENT:  Negative for sore throat.   Respiratory:  Negative for shortness of breath, wheezing and stridor.   Cardiovascular:  Negative for chest pain.  Gastrointestinal:  Negative for abdominal pain, diarrhea, nausea and vomiting.  Neurological:        Confusion   Family History  Problem Relation Age of Onset   Heart disease Other    Diabetes Father    Dementia Mother    Glaucoma Brother     Past Medical History:   Diagnosis Date   Abuse, drug or alcohol ([place])    Anemia    Bipolar affective disorder ([place])    [facility])    liver cancer treated with micrablation   Chronic liver disease and cirrhosis    Depression    DJD (degenerative joint disease)    right knee   Dyslipidemia    Encephalopathy    Esophageal varices ([facility])    Hernia    History of alcohol abuse    PONV (postoperative nausea and vomiting)    Seizures ([place])    Dr Jannifer Franklin   Thrombocytopenia ([place])    Wilson disease    Wrist fracture    right    Past Surgical History:  Procedure Laterality Date   ESOPHAGOGASTRODUODENOSCOPY     HARDWARE REMOVAL Right 01/22/2015   Procedure: HARDWARE REMOVAL RIGHT LEG;  Surgeon: Leandrew Koyanagi, MD;  Location: [place];  Service: Orthopedics;  Laterality: Right;   IR PARACENTESIS  10/04/2018   IR PARACENTESIS  04/29/2019   left inguinal hernia  june 2012   [place]   liver cancer  02/2015   ablation   microablation of liver     OPEN REDUCTION INTERNAL FIXATION (ORIF) DISTAL RADIAL FRACTURE Right 12/21/2016   Procedure: OPEN REDUCTION INTERNAL FIXATION (ORIF) RIGHT DISTAL RADIUS FRACTURE;  Surgeon:  Leandrew Koyanagi, MD;  Location: [place];  Service: Orthopedics;  Laterality: Right;   OPEN REDUCTION INTERNAL FIXATION (ORIF) TIBIA/FIBULA FRACTURE Right 07/25/2013   Procedure: OPEN REDUCTION INTERNAL FIXATION (ORIF) RIGHT TIBIAL PLATEAU, TIBIAL SHAFT AND FIBULA FRACTURES, POSSIBLE FASCIOTOMIES;  Surgeon: Marianna Payment, MD;  Location: [place];  Service: Orthopedics;  Laterality: Right;   ORIF WRIST FRACTURE Left 07/23/2019   Procedure: OPEN REDUCTION INTERNAL FIXATION (ORIF) WRIST FRACTURE;  Surgeon: Leandrew Koyanagi, MD;  Location: [place];  Service: Orthopedics;  Laterality: Left;   TOTAL HIP ARTHROPLASTY Left 07/23/2019   Procedure: TOTAL HIP ARTHROPLASTY ANTERIOR APPROACH;  Surgeon: Leandrew Koyanagi, MD;  Location: [place];  Service: Orthopedics;  Laterality: Left;   TOTAL KNEE  ARTHROPLASTY Right 07/15/2015   Procedure: RIGHT TOTAL KNEE ARTHROPLASTY;  Surgeon: Leandrew Koyanagi, MD;  Location: [place];  Service: Orthopedics;  Laterality: Right;   VASCULAR SURGERY      Social History:  reports that he has been smoking cigarettes. He has been smoking an average of 1 pack per day. He has never used smokeless tobacco. He reports that he does not drink alcohol and does not use drugs.  Allergies:  Allergies  Allergen Reactions   Codeine Nausea And Vomiting and Other (See Comments)    Reports it makes me crazy.     (Not in a hospital admission)    Physical Exam: Blood pressure (!) 126/55, pulse 75, temperature 98.4 F (36.9 C), temperature source Oral, resp. rate 16, height '6\' 1"'$  (1.854 m), weight 95.3 kg, SpO2 100 %. General: resting comfortably, appears stated age, no apparent distress Neurological: alert and oriented, no focal deficits, cranial nerves grossly in tact HEENT: normocephalic, atraumatic, oropharynx clear, icteric sclera CV: regular rate and rhythm, extremities warm and well-perfused Respiratory: normal work of breathing on room air, symmetric chest wall expansion Abdomen: soft, nondistended, nontender to deep palpation. No masses or organomegaly. Laparoscopic incisions are clean and dry with no erythema, induration or drainage. Extremities: warm and well-perfused, no deformities, moving all extremities spontaneously Psychiatric: normal mood and affect Skin: warm and dry, petechiae on lower abdomen   Results for orders placed or performed during the hospital encounter of 05/09/21 (from the past 48 hour(s))  Protime-INR     Status: Abnormal   Collection Time: 05/09/21 12:52 PM  Result Value Ref Range   Prothrombin Time 20.3 (H) 11.4 - 15.2 seconds   INR 1.7 (H) 0.8 - 1.2    Comment: (NOTE) INR goal varies based on device and disease states. Performed at [facility], [place] [address], [place], [place] [address]   CBC with Differential      Status: Abnormal   Collection Time: 05/09/21 12:52 PM  Result Value Ref Range   WBC 10.5 4.0 - 10.5 K/uL   RBC 3.59 (L) 4.22 - 5.81 MIL/uL   Hemoglobin 12.4 (L) 13.0 - 17.0 g/dL   HCT 38.6 (L) 39.0 - 52.0 %   MCV 107.5 (H) 80.0 - 100.0 fL   MCH 34.5 (H) 26.0 - 34.0 pg   MCHC 32.1 30.0 - 36.0 g/dL   RDW 16.4 (H) 11.5 - 15.5 %   Platelets PLATELET CLUMPS NOTED ON SMEAR, UNABLE TO ESTIMATE 150 - 400 K/uL   nRBC 0.0 0.0 - 0.2 %   Neutrophils Relative % 80 %   Neutro Abs 8.4 (H) 1.7 - 7.7 K/uL   Lymphocytes Relative 5 %   Lymphs Abs 0.6 (L) 0.7 - 4.0 K/uL  Monocytes Relative 13 %   Monocytes Absolute 1.3 (H) 0.1 - 1.0 K/uL   Eosinophils Relative 1 %   Eosinophils Absolute 0.1 0.0 - 0.5 K/uL   Basophils Relative 0 %   Basophils Absolute 0.0 0.0 - 0.1 K/uL   Immature Granulocytes 1 %   Abs Immature Granulocytes 0.10 (H) 0.00 - 0.07 K/uL    Comment: Performed at [place] [address], [place], [place] [address]  Comprehensive metabolic panel     Status: Abnormal   Collection Time: 05/09/21 12:52 PM  Result Value Ref Range   Sodium 130 (L) 135 - 145 mmol/L   Potassium 3.8 3.5 - 5.1 mmol/L   Chloride 95 (L) 98 - 111 mmol/L   CO2 23 22 - 32 mmol/L   Glucose, Bld 84 70 - 99 mg/dL    Comment: Glucose reference range applies only to samples taken after fasting for at least 8 hours.   BUN 13 6 - 20 mg/dL   Creatinine, Ser 0.86 0.61 - 1.24 mg/dL   Calcium 8.2 (L) 8.9 - 10.3 mg/dL   Total Protein 5.1 (L) 6.5 - 8.1 g/dL   Albumin 2.2 (L) 3.5 - 5.0 g/dL   AST 134 (H) 15 - 41 U/L   ALT 141 (H) 0 - 44 U/L   Alkaline Phosphatase 217 (H) 38 - 126 U/L   Total Bilirubin 8.7 (H) 0.3 - 1.2 mg/dL   GFR, Estimated >60 >60 mL/min    Comment: (NOTE) Calculated using the CKD-EPI Creatinine Equation (2021)    Anion gap 12 5 - 15    Comment: Performed at [facility], [place] [address], [place], [place] [address]  Ethanol     Status: None   Collection Time: 05/09/21 12:52 PM   Result Value Ref Range   Alcohol, Ethyl (B) <10 <10 mg/dL    Comment: (NOTE) Lowest detectable limit for serum alcohol is 10 mg/dL.  For medical purposes only. Performed at [facility], [place] [address] [place], [place] [address]   Ammonia     Status: Abnormal   Collection Time: 05/09/21 12:52 PM  Result Value Ref Range   Ammonia 64 (H) 9 - 35 umol/L    Comment: Performed at [facility] [address], [place], [place] [address]  Acetaminophen level     Status: Abnormal   Collection Time: 05/09/21 12:52 PM  Result Value Ref Range   Acetaminophen (Tylenol), Serum <10 (L) 10 - 30 ug/mL    Comment: Performed at [facility], [place] [address], [place], [place] [address]  Urinalysis, Routine w reflex microscopic     Status: Abnormal   Collection Time: 05/09/21  4:05 PM  Result Value Ref Range   Color, Urine AMBER (A) YELLOW    Comment: BIOCHEMICALS MAY BE AFFECTED BY COLOR   APPearance CLEAR CLEAR   Specific Gravity, Urine 1.012 1.005 - 1.030   pH 7.0 5.0 - 8.0   Glucose, UA NEGATIVE NEGATIVE mg/dL   Hgb urine dipstick NEGATIVE NEGATIVE   Bilirubin Urine NEGATIVE NEGATIVE   Ketones, ur NEGATIVE NEGATIVE mg/dL   Protein, ur NEGATIVE NEGATIVE mg/dL   Nitrite NEGATIVE NEGATIVE   Leukocytes,Ua NEGATIVE NEGATIVE    Comment: Performed at [place] [address], [place],  [address]  Urine rapid drug screen (hosp performed)     Status: None   Collection Time: 05/09/21  4:05 PM  Result Value Ref Range   Opiates  NONE DETECTED NONE DETECTED   Cocaine NONE DETECTED NONE DETECTED   Benzodiazepines NONE DETECTED NONE DETECTED   Amphetamines NONE DETECTED NONE DETECTED   Tetrahydrocannabinol NONE DETECTED NONE DETECTED   Barbiturates NONE DETECTED NONE DETECTED    Comment: (NOTE) DRUG SCREEN FOR MEDICAL PURPOSES ONLY.  IF CONFIRMATION IS NEEDED FOR ANY PURPOSE, NOTIFY LAB WITHIN 5 DAYS.  LOWEST DETECTABLE LIMITS FOR URINE DRUG SCREEN Drug Class                      Cutoff (ng/mL) Amphetamine and metabolites    1000 Barbiturate and metabolites    200 Benzodiazepine                 [id number] Tricyclics and metabolites     300 Opiates and metabolites        300 Cocaine and metabolites        300 THC                            50 Performed at [facility], [place] [address], [place], [place] [address]   SARS CORONAVIRUS 2 (TAT 6-24 HRS) Nasopharyngeal Nasopharyngeal Swab     Status: None   Collection Time: 05/09/21  5:29 PM   Specimen: Nasopharyngeal Swab  Result Value Ref Range   SARS Coronavirus 2 NEGATIVE NEGATIVE    Comment: (NOTE) SARS-CoV-2 target nucleic acids are NOT DETECTED.  The SARS-CoV-2 RNA is generally detectable in upper and lower respiratory specimens during the acute phase of infection. Negative results do not preclude SARS-CoV-2 infection, do not rule out co-infections with other pathogens, and should not be used as the sole basis for treatment or other patient management decisions. Negative results must be combined with clinical observations, patient history, and epidemiological information. The expected result is Negative.  Fact Sheet for Patients: SugarRoll.be  Fact Sheet for Healthcare Providers: https://www.woods-mathews.com/  This test is not yet approved or cleared by the [place] FDA and  has been authorized for detection and/or diagnosis of SARS-CoV-2 by FDA under an Emergency Use Authorization (EUA). This EUA will remain  in effect (meaning this test can be used) for the duration of the COVID-19 declaration under Se ction 564(b)(1) of the Act, 21 U.S.C. section 360bbb-3(b)(1), unless the authorization is terminated or revoked sooner.  Performed at [facility], [place] [address] [place], [place] [address]    CT HEAD WO CONTRAST (5MM)  Result Date: 05/09/2021 CLINICAL DATA:  Delirium, altered mental status EXAM: CT HEAD WITHOUT CONTRAST  TECHNIQUE: Contiguous axial images were obtained from the base of the skull through the vertex without intravenous contrast. COMPARISON:  07/21/2019, 11/29/2008 FINDINGS: Brain: Stable mild atrophy pattern. No acute intracranial hemorrhage, mass lesion, acute infarction, midline shift, herniation, hydrocephalus, or extra-axial fluid collection. Cisterns are patent. Cerebellar atrophy as well. Vascular: No hyperdense vessel or unexpected calcification. Skull: Normal. Negative for fracture or focal lesion. Sinuses/Orbits: No acute finding. Other: None. IMPRESSION: Stable atrophy pattern. No acute intracranial abnormality by noncontrast CT. Electronically Signed   By: Jerilynn Mages.  Shick M.D.   On: 05/09/2021 14:48   CT ABDOMEN PELVIS W CONTRAST  Result Date: 05/09/2021 CLINICAL DATA:  Fever and abdominal pain, postoperative. History of [place] post ablation 1 week ago. Laparoscopy 1 week ago. EXAM: CT ABDOMEN AND PELVIS WITH CONTRAST TECHNIQUE: Multidetector CT imaging of the abdomen and pelvis was performed using the standard protocol following bolus administration of  intravenous contrast. CONTRAST:  66m OMNIPAQUE IOHEXOL 350 MG/ML SOLN COMPARISON:  Abdominal ultrasound 05/09/2021. MRI abdomen 01/21/2015. FINDINGS: Lower chest: There is atelectasis in the lung bases. Hepatobiliary: Gallstones are present. There is gallbladder wall thickening/edema diffusely. There is no definite biliary ductal dilatation. There is cirrhotic liver contour. Branching hypodensity throughout the liver may represent intrahepatic biliary ductal dilatation, periportal edema or abnormal portal or hepatic veins. There are 2 hypodense lobulated heterogeneous areas in the inferior right lobe of the liver which may represent recently treated hepatic masses. The more inferior mass measures 5.2 by 4.5 cm. The mass located more superiorly measures 4.8 by 4.2 cm. Pancreas: Unremarkable. No pancreatic ductal dilatation or surrounding inflammatory changes.  Spleen: Mildly enlarged, similar to the prior study. Adrenals/Urinary Tract: The adrenal glands are within normal limits. There is no hydronephrosis or perinephric fluid. There is a rounded hypodensity in the right kidney which is too small to characterize likely cyst. The bladder is grossly within normal limits, but evaluation of the bladder is limited secondary to streak artifact in the pelvis. Stomach/Bowel: There is marked wall thickening of the ascending colon and transverse colon. The appendix appears within normal limits. The distal colon appears within normal limits. There is also some wall thickening of small bowel loops throughout the mid abdomen which are mildly dilated measuring up to 3.2 cm. Questionable pneumatosis identified within left-sided small bowel loops image 3/57. There is a small amount of free air in the upper abdomen. Vascular/Lymphatic: Aorta and IVC are normal in size. Splenic varices and large splenorenal shunt are again noted. Superior mesenteric vein grossly patent. Portal vein is not visualized and portal vein thrombosis is not excluded. Reproductive: Prostate is unremarkable. Other: There is a moderate amount of ascites. There is diffuse body wall edema. Large fluid collection visualized in the left scrotal region. There is a small umbilical hernia containing fat and air. Bilateral gynecomastia. Musculoskeletal: The bones are diffusely osteopenic. There is an acute or subacute fracture of the superior endplate of L4 with [id number] vertebral body height. There is minimal retropulsion of fracture fragments. There is trace compression deformity of the superior endplate of [id number] appears chronic, but is age indeterminate left hip arthroplasty is present. IMPRESSION: 1. There is free air in the upper abdomen. This may be related to recent laparoscopy; however, given other abnormal bowel findings listed below, bowel perforation cannot be excluded. 2. Marked wall thickening of the  ascending colon and transverse colon worrisome for nonspecific colitis including infectious, inflammatory and ischemic etiologies. 3. Wall thickening and mild dilatation of small-bowel loops in the central abdomen with questionable pneumatosis. Findings may represent ischemic enteritis. Other etiologies for enteritis not excluded. 4. Moderate amount of ascites. 5. Findings compatible with cirrhosis and portal hypertension. There are 2 lesions in the right lobe of the liver likely related to recent treated cancer. 6. Cholelithiasis. There is diffuse gallbladder wall edema which may be reactive secondary to 2 ascites/hepatocellular disease; however, cholecystitis cannot be excluded. 7. The portal vein is not opacified. Portal vein thrombosis is not excluded. 8. Splenomegaly and varices appear grossly unchanged compatible with portal systemic hypertension. 9. Acute or subacute compression fracture superior endplate of L4. 10. Large amount of left scrotal fluid. Recommend clinical correlation and follow-up. 11. These results were called by telephone at the time of interpretation on 05/09/2021 at 10:02 pm to provider Dr. GRegenia Skeeter who verbally acknowledged these results. Electronically Signed   By: ARonney AstersM.D.   On: 05/09/2021 22:06  DG Chest Portable 1 View  Result Date: 05/09/2021 CLINICAL DATA:  Altered mental status, history cirrhosis EXAM: PORTABLE CHEST 1 VIEW COMPARISON:  Portable exam 1345 hours compared to 07/21/2019 FINDINGS: Normal heart size, mediastinal contours, and pulmonary vascularity. Lungs clear. No infiltrate, pleural effusion, or pneumothorax. No acute osseous findings. IMPRESSION: No acute abnormalities. Electronically Signed   By: Lavonia Asher M.D.   On: 05/09/2021 14:03   US Abdomen Limited RUQ (LIVER/GB)  Result Date: 05/09/2021 CLINICAL DATA:  Right upper quadrant pain. Altered mental status. History of [place] post ablation 1 week ago. EXAM: ULTRASOUND ABDOMEN LIMITED RIGHT UPPER  QUADRANT COMPARISON:  No recent imaging. Most recent CT available 12/24/2014. FINDINGS: Gallbladder: Shadowing gallstones. Diffuse wall thickening up to 7 mm. No sonographic Murphy sign noted by sonographer. Common bile duct: Diameter: Not well visualized, but likely 3-4 mm. Liver: Cirrhotic hepatic morphology with nodular contours and diffusely increased parenchymal echogenicity. There are 2 hypoechoic lesions, 1 in the anterior right lobe measuring 3.8 x 3.7 x 2.8 cm. Additional lesion in the posterior right lobe measures 3.9 x 3.1 x 3.6 cm. Nonspecific rind of increased echogenicity in the periphery of the right lobe adjacent to 1 of these liver lesions. Portal vein is patent on color Doppler imaging with reversal of blood flow away from the liver. Other: Right upper quadrant ascites, with fluid appearing relatively simple. IMPRESSION: 1. Hepatic cirrhosis. Two hypodense liver lesions. One of these lesions has a nonspecific rind of increased echogenicity adjacent which is nonspecific by ultrasound. There is no interval comparison imaging since 2016, and findings are nonspecific. Given clinical concern for procedure complications such as bleeding, recommend abdominopelvic CT (with IV contrast in the absence of contraindication). 2. Right upper quadrant ascites, appears simple. 3. Gallstones. Diffuse gallbladder wall thickening, nonspecific but likely due to chronic liver disease. 4. Reversal of blood flow in the main portal vein. Electronically Signed   By: Keith Rake M.D.   On: 05/09/2021 18:20      Assessment/Plan This is a 55 year old male with a history of decompensated cirrhosis and HCC, who is 6 days status post laparoscopic ablation of [place] in the right liver [facility].  He has had hepatic encephalopathy in the past and presented with altered mental status this morning.  I reviewed his CT scan, which shows moderate amount of intra-abdominal free fluid consistent with ascites, and a small amount of  pneumoperitoneum which is expected in the postoperative period.  The right colon does appear thickened but there is no pneumatosis or portal venous gas to suggest ischemia.  In addition the patient clinically appears very well, with no fevers, leukocytosis or signs of systemic sepsis, and his abdominal exam is very benign.  I do not feel that his CT findings represent an acute surgical emergency, and has mild confusion is more likely secondary to hepatic encephalopathy.  It is reasonable to continue treatment with antibiotics for possible colitis.  Surgery will continue to follow while admitted.   Michaelle Birks, [facility], Hepatobiliary and Pancreatic Surgery 05/10/21 12:39 AM

## 2021-05-10 NOTE — Consult Note (Addendum)
[facility] Consult: 12:40 PM 05/10/2021  LOS: 0 days    Referring Provider: Dr Verlon Au  Primary Care Physician:  Cher Nakai, MD in [place].   Primary Gastroenterologist: unassigned.  Dr. Rayvon Char.   Dr Rosey Bath at [facility].   Pt address is in [place], [place].     Reason for Consultation: Hepatic encephalopathy, ascites, HCC.   HPI: [name] is a 55 y.o. male.  PMH bipolar disorder.  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  Hepatic encephalopathy.  Seizures due to EtOH withdrawal.  Sober x 15 years.    Hepatocellular carcinoma w recurrence.     02/2015 TACE.  10/21/2020 TACE segment VI lesion.   05/03/2021 Laparoscopic RFA.  Operative report mentions macronodular cirrhosis with omentum adherent to liver at site of previous ablation.  1.5 L ascites removed.   3.4 cm tumor in segment VII ablated.   At the time of the TACE in February, chronic dissection of the celiac artery noted. 04/11/2021 AFP level of 4.  However this had been assayed a month earlier, and 4 times between 2019 and December 2021 and was always normal.  01/2014 EGD.  Grade 1 varices with scarring from previous banding.  Portal hypertensive gastropathy.  Gastritis. 11/2015 colonoscopy with removal of 5 polyps located in the cecum, transverse, sigmoid and rectum. 11/2015 EGD.  Normal.  No recurrent varices.  Scarring from previous banding in esophagus noted 04/2018 EGD.  Nonbleeding grade 1 stool esophageal varices, no stigmata of bleeding.  Prepyloric gastric erosions without bleeding.  Mild portal hypertensive gastropathy. 09/10/2020 EGD.  Nonbleeding, grade 1 varix with extensive scarring.  Normal stomach and examined duodenum.  Altered mental status developed in a.m. of 9/5.  He had stopped taking lactulose as he was having  regular bowel movements without it.   Ammonia level 64.  T bili 8.7.  Alk phos 217.  AST/ALT 134/141.  These are improving within 24 hours.  Albumin 2.  INR stable at 1.7.  Hgb 11.9.  MCV 107.  Platelets 69.  Normal WBC.  APAP < 10.  ETOH < 10.    CTAP w contrast: Free air in upper abdomen likely related to recent laparoscopy.  Thickening of colon wall at the ascending and transverse colon.  Thickening and mild dilation of small bowel loops in central abdomen with question pneumatosis.  ?  Inflammatory/, infectious, ischemic etiologies.  Moderate ascites.  Diffuse body wall edema.  Large fluid collection in left scrotum.  Small umbilical hernia.  2 lesions in the right lobe of liver related to recent cancer treatment.  Cholelithiasis, diffuse GB wall edema, possibly reactive but cannot exclude cholecystitis.  Portal vein not opacified, unable to exclude PVT.  No change in splenomegaly and varices.  Diffuse spinal osteopenia acute vs subacute compression fracture L4.  Large amount left scrotal fluid.  Gynecomastia. Abdominal ultrasound: Cirrhosis.  2 lesions in the right liver.  One of the lesions has nonspecific rind of increased echo which is nonspecific by ultrasound.  RUQ ascites.  Gallstones.  GB  wall thickening nonspecific.  Reversal of blood flow in main portal vein.  [facility] unable to accept the patient in transfer.  Surgeon, Dr. Michaelle Birks, has consulted.  Right colon is thickened but there is no pneumatosis or portal venous gas suggesting ischemia.  Clinically looks well and she does not feel any acute surgical emergency  Thus far today patient's had 1 bowel movement and his mental status has improved.  Able to provide me with a good history.  Says he has had increased somnolence in recent week or so.  Appetite is good.  No new abdominal distention or discomfort.  Chronic scrotal swelling due to hernia, ascites..  Surgery has not wanted to operate unless scrotal edema improves/resolves.  The small  volume pneumoperitoneum is expected postoperatively.  [name] He confirms that its been months since he took lactulose.  Many years ago he was prescribed rifaximin but the prescription was only good for 3 months.  Normally has anywhere from 1-3 formed brown stools daily but in the last for 5 days has not had any bowel movements until earlier today.  Appetite generally good.  No nausea or vomiting.   Sober for 15 years.  Past Medical History:  Diagnosis Date   Abuse, drug or alcohol ([place])    Anemia    Bipolar affective disorder ([place])    [facility])    liver cancer treated with micrablation   Chronic liver disease and cirrhosis    Depression    DJD (degenerative joint disease)    right knee   Dyslipidemia    Encephalopathy    Esophageal varices ([facility])    Hernia    History of alcohol abuse    PONV (postoperative nausea and vomiting)    Seizures ([facility])    Dr Jannifer Franklin   Thrombocytopenia ([place])    Wilson disease    Wrist fracture    right    Past Surgical History:  Procedure Laterality Date   ESOPHAGOGASTRODUODENOSCOPY     HARDWARE REMOVAL Right 01/22/2015   Procedure: HARDWARE REMOVAL RIGHT LEG;  Surgeon: Leandrew Koyanagi, MD;  Location: [facility];  Service: Orthopedics;  Laterality: Right;   IR PARACENTESIS  10/04/2018   IR PARACENTESIS  04/29/2019   left inguinal hernia  june 2012   [place]   liver cancer  02/2015   ablation   microablation of liver     OPEN REDUCTION INTERNAL FIXATION (ORIF) DISTAL RADIAL FRACTURE Right 12/21/2016   Procedure: OPEN REDUCTION INTERNAL FIXATION (ORIF) RIGHT DISTAL RADIUS FRACTURE;  Surgeon: Leandrew Koyanagi, MD;  Location: [place];  Service: Orthopedics;  Laterality: Right;   OPEN REDUCTION INTERNAL FIXATION (ORIF) TIBIA/FIBULA FRACTURE Right 07/25/2013   Procedure: OPEN REDUCTION INTERNAL FIXATION (ORIF) RIGHT TIBIAL PLATEAU, TIBIAL SHAFT AND FIBULA FRACTURES, POSSIBLE FASCIOTOMIES;  Surgeon: Marianna Payment, MD;  Location: [place];  Service: Orthopedics;  Laterality: Right;   ORIF WRIST FRACTURE Left 07/23/2019   Procedure: OPEN REDUCTION INTERNAL FIXATION (ORIF) WRIST FRACTURE;  Surgeon: Leandrew Koyanagi, MD;  Location: [place];  Service: Orthopedics;  Laterality: Left;   TOTAL HIP ARTHROPLASTY Left 07/23/2019   Procedure: TOTAL HIP ARTHROPLASTY ANTERIOR APPROACH;  Surgeon: Leandrew Koyanagi, MD;  Location: [place];  Service: Orthopedics;  Laterality: Left;   TOTAL KNEE ARTHROPLASTY Right 07/15/2015   Procedure: RIGHT TOTAL KNEE ARTHROPLASTY;  Surgeon: Leandrew Koyanagi, MD;  Location: [place];  Service: Orthopedics;  Laterality: Right;   VASCULAR SURGERY  Prior to Admission medications   Medication Sig Start Date End Date Taking? Authorizing Provider  furosemide (LASIX) 40 MG tablet Take 2 tablets (80 mg total) by mouth 2 (two) times daily. 04/29/19   Biagio Borg, MD  lactulose [facility]) 10 GM/15ML solution Take 20 g by mouth daily. 09/10/19   [provider]  levETIRAcetam (KEPPRA) 500 MG tablet Take 3 tablets (1,500 mg total) by mouth 2 (two) times daily. 12/20/20   Cameron Sprang, MD  potassium chloride (KLOR-CON) 10 MEQ tablet TAKE ONE TABLET BY MOUTH TWICE DAILY 01/27/20   Biagio Borg, MD  rOPINIRole (REQUIP) 0.5 MG tablet Take 0.5 mg by mouth at bedtime. 10/24/19   [provider]  spironolactone (ALDACTONE) 100 MG tablet Take 2 tablets (200 mg total) by mouth every morning. 04/29/19   Biagio Borg, MD  zinc gluconate 50 MG tablet Take 50 mg by mouth 3 (three) times daily.     [provider]    Scheduled Meds:  furosemide  80 mg Oral BID   lactulose  30 g Oral TID   levETIRAcetam  1,500 mg Oral BID   nicotine  14 mg Transdermal Daily   potassium chloride  10 mEq Oral BID   rOPINIRole  0.5 mg Oral QHS   spironolactone  200 mg Oral q morning   Infusions:  piperacillin-tazobactam (ZOSYN)  IV Stopped (05/10/21 1156)   PRN Meds: acetaminophen **OR** acetaminophen, hydrALAZINE, ondansetron  **OR** ondansetron (ZOFRAN) IV   Allergies as of 05/09/2021 - Review Complete 05/09/2021  Allergen Reaction Noted   Codeine Nausea And Vomiting and Other (See Comments) 04/14/2011    Family History  Problem Relation Age of Onset   Heart disease Other    Diabetes Father    Dementia Mother    Glaucoma Brother     Social History   Socioeconomic History   Marital status: Divorced    Spouse name: Not on file   Number of children: 2   Years of education: HS   Highest education level: Not on file  Occupational History   Occupation: disabled    Employer: [employer]  Tobacco Use   Smoking status: Every Day    Packs/day: 1.00    Types: Cigarettes   Smokeless tobacco: Never  Vaping Use   Vaping Use: Never used  Substance and Sexual Activity   Alcohol use: No    Alcohol/week: 0.0 standard drinks    Comment: 3-4 bottles a week, quit drinking 2005   Drug use: No    Comment: last use 2005   Sexual activity: Yes  Other Topics Concern   Not on file  Social History Narrative   Patient is right handed.   Patient drinks some caffeine daily.   Lives in a one story home.   Social Determinants of Health   Financial Resource Strain: Not on file  Food Insecurity: Not on file  Transportation Needs: Not on file  Physical Activity: Not on file  Stress: Not on file  Social Connections: Not on file  Intimate Partner Violence: Not on file    REVIEW OF SYSTEMS: Constitutional: Generally no weakness, no fatigue. ENT:  No nose bleeds Pulm: No shortness of breath, no cough CV:  No palpitations, no angina.  Periodic lower extremity edema. GU: Chronic scrotal swelling which is uncomfortable but not painful.  No hematuria, no frequency GI: See HPI. Heme: [place] unusual bleeding or bruising. Transfusions: None. Neuro:  No headaches, no peripheral tingling or numbness.  No  seizures, no syncope. Derm:  No itching, no rash or sores.  Endocrine:  No sweats or chills.  No polyuria or  dysuria Immunization: Reviewed.  I do not see records of COVID 19 vaccination and I did not query him about specific vaccinations. Travel:  None beyond local counties in last few months.    PHYSICAL EXAM: Vital signs in last 24 hours: Vitals:   05/10/21 1115 05/10/21 1145  BP: 122/69 130/71  Pulse: 64 68  Resp: 17 19  Temp:    SpO2: 97% 97%   Wt Readings from Last 3 Encounters:  05/09/21 95.3 kg  06/02/20 93.8 kg  12/01/19 93.9 kg    General: Jaundiced, alert, comfortable.  Thin. Head: No facial asymmetry or swelling.  No signs of head trauma. Eyes: Mild scleral icterus.  No conjunctival pallor.  EOMI. Ears: Not hard of hearing Nose: No congestion or discharge Mouth: Moist, pink, clear oropharynx.  Good dentition.  Tongue midline. Neck: No JVD, no masses, no thyromegaly Lungs: Clear bilaterally.  No labored breathing or cough. Heart: RRR.  No MRG.  S1, S2 present Abdomen: Soft without distention.  No HSM, masses, bruits, hernias.  Laparoscopy incision site at umbilicus is CDI.[name]   GU: Scrotal edema.  2 small, ivory colored, subcutaneous nodules at anterior/left of midline.  Rectal: Deferred. Musc/Skeltl: No joint redness, swelling or gross deformity. Extremities: No CCE.  Skin on lower legs is hyperemic. Neurologic: Not confused.  Response time is a bit slow but accurate and precise.  Moves all 4 limbs. ?  Slight asterixis on the left hand versus tremor.  Oriented x3. Skin: Tanned so hard to appreciate jaundice.  Some telangiectasia on upper trunk Tattoos: None observed Nodes: No cervical adenopathy Psych: Calm, pleasant, cooperative.  Intake/Output from previous day: 09/05 0701 - 09/06 0700 In: -  Out: 2020 [Urine:2020] Intake/Output this shift: Total I/O In: 41.1 [IV Piggyback:41.1] Out: -   LAB RESULTS: Recent Labs    05/09/21 1252 05/10/21 0327  WBC 10.5 7.9  HGB 12.4* 11.9*  HCT 38.6* 35.2*  PLT PLATELET CLUMPS NOTED ON SMEAR, UNABLE TO ESTIMATE 69*    BMET Lab Results  Component Value Date   NA 132 (L) 05/10/2021   NA 130 (L) 05/09/2021   NA 135 07/25/2019   K 3.3 (L) 05/10/2021   K 3.8 05/09/2021   K 4.2 07/25/2019   CL 102 05/10/2021   CL 95 (L) 05/09/2021   CL 103 07/25/2019   CO2 25 05/10/2021   CO2 23 05/09/2021   CO2 25 07/25/2019   GLUCOSE 118 (H) 05/10/2021   GLUCOSE 84 05/09/2021   GLUCOSE 115 (H) 07/25/2019   BUN 10 05/10/2021   BUN 13 05/09/2021   BUN 19 07/25/2019   CREATININE 0.84 05/10/2021   CREATININE 0.86 05/09/2021   CREATININE 0.70 07/25/2019   CALCIUM 8.3 (L) 05/10/2021   CALCIUM 8.2 (L) 05/09/2021   CALCIUM 8.7 (L) 07/25/2019   LFT Recent Labs    05/09/21 1252 05/10/21 0327  PROT 5.1* 4.8*  ALBUMIN 2.2* 2.0*  AST 134* 100*  ALT 141* 116*  ALKPHOS 217* 200*  BILITOT 8.7* 8.2*   PT/INR Lab Results  Component Value Date   INR 1.7 (H) 05/10/2021   INR 1.7 (H) 05/09/2021   INR 1.5 (H) 07/21/2019   Hepatitis Panel No results for input(s): HEPBSAG, HCVAB, HEPAIGM, HEPBIGM in the last 72 hours. C-Diff No components found for: CDIFF Lipase     Component Value Date/Time   LIPASE  40.0 04/30/2014 1726    Drugs of Abuse     Component Value Date/Time   LABOPIA NONE DETECTED 05/09/2021 1605   COCAINSCRNUR NONE DETECTED 05/09/2021 1605   LABBENZ NONE DETECTED 05/09/2021 1605   AMPHETMU NONE DETECTED 05/09/2021 1605   THCU NONE DETECTED 05/09/2021 1605   LABBARB NONE DETECTED 05/09/2021 1605     RADIOLOGY STUDIES: CT HEAD WO CONTRAST (5MM)  Result Date: 05/09/2021 CLINICAL DATA:  Delirium, altered mental status EXAM: CT HEAD WITHOUT CONTRAST TECHNIQUE: Contiguous axial images were obtained from the base of the skull through the vertex without intravenous contrast. COMPARISON:  07/21/2019, 11/29/2008 FINDINGS: Brain: Stable mild atrophy pattern. No acute intracranial hemorrhage, mass lesion, acute infarction, midline shift, herniation, hydrocephalus, or extra-axial fluid collection.  Cisterns are patent. Cerebellar atrophy as well. Vascular: No hyperdense vessel or unexpected calcification. Skull: Normal. Negative for fracture or focal lesion. Sinuses/Orbits: No acute finding. Other: None. IMPRESSION: Stable atrophy pattern. No acute intracranial abnormality by noncontrast CT. Electronically Signed   By: Jerilynn Mages.  Shick M.D.   On: 05/09/2021 14:48   CT ABDOMEN PELVIS W CONTRAST  Result Date: 05/09/2021 CLINICAL DATA:  Fever and abdominal pain, postoperative. History of [place] post ablation 1 week ago. Laparoscopy 1 week ago. EXAM: CT ABDOMEN AND PELVIS WITH CONTRAST TECHNIQUE: Multidetector CT imaging of the abdomen and pelvis was performed using the standard protocol following bolus administration of intravenous contrast. CONTRAST:  52m OMNIPAQUE IOHEXOL 350 MG/ML SOLN COMPARISON:  Abdominal ultrasound 05/09/2021. MRI abdomen 01/21/2015. FINDINGS: Lower chest: There is atelectasis in the lung bases. Hepatobiliary: Gallstones are present. There is gallbladder wall thickening/edema diffusely. There is no definite biliary ductal dilatation. There is cirrhotic liver contour. Branching hypodensity throughout the liver may represent intrahepatic biliary ductal dilatation, periportal edema or abnormal portal or hepatic veins. There are 2 hypodense lobulated heterogeneous areas in the inferior right lobe of the liver which may represent recently treated hepatic masses. The more inferior mass measures 5.2 by 4.5 cm. The mass located more superiorly measures 4.8 by 4.2 cm. Pancreas: Unremarkable. No pancreatic ductal dilatation or surrounding inflammatory changes. Spleen: Mildly enlarged, similar to the prior study. Adrenals/Urinary Tract: The adrenal glands are within normal limits. There is no hydronephrosis or perinephric fluid. There is a rounded hypodensity in the right kidney which is too small to characterize likely cyst. The bladder is grossly within normal limits, but evaluation of the bladder is  limited secondary to streak artifact in the pelvis. Stomach/Bowel: There is marked wall thickening of the ascending colon and transverse colon. The appendix appears within normal limits. The distal colon appears within normal limits. There is also some wall thickening of small bowel loops throughout the mid abdomen which are mildly dilated measuring up to 3.2 cm. Questionable pneumatosis identified within left-sided small bowel loops image 3/57. There is a small amount of free air in the upper abdomen. Vascular/Lymphatic: Aorta and IVC are normal in size. Splenic varices and large splenorenal shunt are again noted. Superior mesenteric vein grossly patent. Portal vein is not visualized and portal vein thrombosis is not excluded. Reproductive: Prostate is unremarkable. Other: There is a moderate amount of ascites. There is diffuse body wall edema. Large fluid collection visualized in the left scrotal region. There is a small umbilical hernia containing fat and air. Bilateral gynecomastia. Musculoskeletal: The bones are diffusely osteopenic. There is an acute or subacute fracture of the superior endplate of L4 with 255%loss vertebral body height. There is minimal retropulsion of fracture fragments. There  is trace compression deformity of the superior endplate of V77 which appears chronic, but is age indeterminate left hip arthroplasty is present. IMPRESSION: 1. There is free air in the upper abdomen. This may be related to recent laparoscopy; however, given other abnormal bowel findings listed below, bowel perforation cannot be excluded. 2. Marked wall thickening of the ascending colon and transverse colon worrisome for nonspecific colitis including infectious, inflammatory and ischemic etiologies. 3. Wall thickening and mild dilatation of small-bowel loops in the central abdomen with questionable pneumatosis. Findings may represent ischemic enteritis. Other etiologies for enteritis not excluded. 4. Moderate amount of  ascites. 5. Findings compatible with cirrhosis and portal hypertension. There are 2 lesions in the right lobe of the liver likely related to recent treated cancer. 6. Cholelithiasis. There is diffuse gallbladder wall edema which may be reactive secondary to 2 ascites/hepatocellular disease; however, cholecystitis cannot be excluded. 7. The portal vein is not opacified. Portal vein thrombosis is not excluded. 8. Splenomegaly and varices appear grossly unchanged compatible with portal systemic hypertension. 9. Acute or subacute compression fracture superior endplate of L4. 10. Large amount of left scrotal fluid. Recommend clinical correlation and follow-up. 11. These results were called by telephone at the time of interpretation on 05/09/2021 at 10:02 pm to provider Dr. Regenia Skeeter, who verbally acknowledged these results. Electronically Signed   By: Ronney Asters M.D.   On: 05/09/2021 22:06   DG Chest Portable 1 View  Result Date: 05/09/2021 CLINICAL DATA:  Altered mental status, history cirrhosis EXAM: PORTABLE CHEST 1 VIEW COMPARISON:  Portable exam 1345 hours compared to 07/21/2019 FINDINGS: Normal heart size, mediastinal contours, and pulmonary vascularity. Lungs clear. No infiltrate, pleural effusion, or pneumothorax. No acute osseous findings. IMPRESSION: No acute abnormalities. Electronically Signed   By: Lavonia Joshu M.D.   On: 05/09/2021 14:03   US Abdomen Limited RUQ (LIVER/GB)  Result Date: 05/09/2021 CLINICAL DATA:  Right upper quadrant pain. Altered mental status. History of [place] post ablation 1 week ago. EXAM: ULTRASOUND ABDOMEN LIMITED RIGHT UPPER QUADRANT COMPARISON:  No recent imaging. Most recent CT available 12/24/2014. FINDINGS: Gallbladder: Shadowing gallstones. Diffuse wall thickening up to 7 mm. No sonographic Murphy sign noted by sonographer. Common bile duct: Diameter: Not well visualized, but likely 3-4 mm. Liver: Cirrhotic hepatic morphology with nodular contours and diffusely increased  parenchymal echogenicity. There are 2 hypoechoic lesions, 1 in the anterior right lobe measuring 3.8 x 3.7 x 2.8 cm. Additional lesion in the posterior right lobe measures 3.9 x 3.1 x 3.6 cm. Nonspecific rind of increased echogenicity in the periphery of the right lobe adjacent to 1 of these liver lesions. Portal vein is patent on color Doppler imaging with reversal of blood flow away from the liver. Other: Right upper quadrant ascites, with fluid appearing relatively simple. IMPRESSION: 1. Hepatic cirrhosis. Two hypodense liver lesions. One of these lesions has a nonspecific rind of increased echogenicity adjacent which is nonspecific by ultrasound. There is no interval comparison imaging since 2016, and findings are nonspecific. Given clinical concern for procedure complications such as bleeding, recommend abdominopelvic CT (with IV contrast in the absence of contraindication). 2. Right upper quadrant ascites, appears simple. 3. Gallstones. Diffuse gallbladder wall thickening, nonspecific but likely due to chronic liver disease. 4. Reversal of blood flow in the main portal vein. Electronically Signed   By: Keith Rake M.D.   On: 05/09/2021 18:20      IMPRESSION:      Hepatic encephalopathy.   Has not been taking lactulose  for at least several weeks if not several months.  Remotely was on rifaximin.  Ammonia is 64.  There is ascites on imaging studies so need to rule out SBP.     [place] with recurrence.  Status post TACE 2016, 10/2020.  02/2021 laparoscopy with RFA.     Portal hypertension.  Previous EGDs with esophageal banding before 2015.  Subsequent EGDs show nonbleeding grade 1 varices, portal hypertensive gastropathy, gastritis.     Cirrhosis of the liver due to alcohol and Wilson's disease.  ETOH abstinence since ~ 2007.    Scrotal edema, inguinal hernia.    Ascites.  Rule out SBP.  Empiric Zosyn in place.  Home meds include spironolactone 200 mg daily, furosemide 80 mg bid.       Gallbladder wall edema, likely due to ascites.       Thickened right sided, transverse colon.  Wall thickening and mild dilation of small bowel.  Low suspicion for infectious or inflammatory colitis.  Suspect this is due to ascites, hypoalbuminemia.     Thrombocytopenia, splenomegaly  Macrocytosis.    PLAN:        Ordered Paracentesis.  Send studies for cell count and differential.  Check B12, folate levels.    May need to add rifaximin but his mental status is improved already so holding off on this.  Patient has appt 9/8 w Dr Drue Novel at [facility].    Patient can likely discharge from hospital tomorrow.   Azucena Freed  05/10/2021, 12:40 PM Phone [phone]   Attending physician's note   I have taken an interval history, reviewed the chart and examined the patient. I agree with the Advanced Practitioner's note, impression and recommendations.   Decompensated liver cirrhosis (d/t ETOH/Wilson's disease) with portal hypertension.  Followed at [facility] liver transplant clinic. Adm with Hepatic encephalopathy to [facility] as [facility] was on "bypass" after recent rpt lap with [place] RFA ablation 05/03/2021. No obvious infections. [place] with recurrence s/p TACE/RFA as above. Ascites s/p paracentesis today  Plan: -Restarted lactulose 30 cc p.o. TID.  Titrate to 2-3 Bms/day -Add rifaximin 550 mg p.o. twice daily -Follow ascitic fluid analysis including cytology. -He has FU appt with Dr. Drue Novel at [facility] 9/8 and intends to keep it -Resume previous diuretics. -Anticipate D/C in AM   Carmell Austria, MD [facility] [phone]

## 2021-05-10 NOTE — Procedures (Signed)
PROCEDURE SUMMARY:  Successful US guided paracentesis from left abdomen.  Yielded 2.8 L of yellow fluid.  No immediate complications.  Pt tolerated well.   Specimen sent for labs.  EBL < 2 mL  Theresa Duty, NP 05/10/2021 4:36 PM

## 2021-05-10 NOTE — Progress Notes (Signed)
PROGRESS NOTE   [name]  [id number] DOB: 12-11-1965 DOA: 05/09/2021 PCP: Cher Nakai, MD  Brief Narrative:  55 year old white male, history of bipolar, polysubstance abuse, cirrhosis, possible Wilson's disease-with history of encephalopathy and varices, hyperlipidemia, alcohol-related seizures Diagnosis hepatocellular cancer prior laparoscopic ablation 02/24/2015 LI RADS 4 hypovascular region-had an attempt at TACE 01/2017 Underwent laparoscopic ablation of recurrent HCC with TACE8/30 Noncompliant on lactulose post hospital stay found to be confused prior roommate on 05/09/2021   Hospital-Problem based course  Likely hepatic encephalopathy Mentation nearing normal but not quite there Continue lactulose 30 3 times daily at this time with at least 2-3 loose stools per day Liver cirrhosis from alcoholism, Wilsons disease-MELD score about 25 Continue Lasix p.o. 80 twice daily and Aldactone 200 every morning-outpatient follow-up with [facility] for postoperative routine care Hypokalemia careful replacement of potassium given on potassium sparing diuretic\ Recheck labs with magnesium in the morning Colitis? Feel antibiotics may be able to be discontinued if no acute surgical abdomen-defer this to general surgery Chronic seizure disorder Urine drug screen is negative, continue Keppra 1500 twice daily, Requip 0.5 nightly Lovenox   DVT prophylaxis: Lovenox Code Status: Full Family Communication: No one present today Disposition:  Status is: Observation  The patient remains OBS appropriate and will d/c before 2 midnights.  Dispo: The patient is from: Home              Anticipated d/c is to: Home              Patient currently is not medically stable to d/c.   Difficult to place patient No       Consultants:  General surgery  Procedures: CT scan  Antimicrobials: Zosyn since admission   Subjective: Awake coherent pleasant no distress About to have breakfast No chest pain  no fever no chills no rigors no cough no cold Cannot tell me the month and the place but cannot tell me the year-still has a little bit of confusion-also cannot tell me when he has his follow-up appointment "I think I will be able to remember these dates once I feel less confused"  Objective: Vitals:   05/09/21 2145 05/10/21 0101 05/10/21 0336 05/10/21 0550  BP: (!) 126/55 (!) 118/54 (!) 119/59 (!) 127/55  Pulse: 75 73 69 78  Resp: '16 16 16 16  '$ Temp: 98.4 F (36.9 C) 98.1 F (36.7 C) 98.7 F (37.1 C)   TempSrc: Oral Oral Oral   SpO2: 100% 97% 98% 98%  Weight: 95.3 kg     Height: '6\' 1"'$  (1.854 m)       Intake/Output Summary (Last 24 hours) at 05/10/2021 0706 Last data filed at 05/10/2021 0532 Gross per 24 hour  Intake --  Output 2020 ml  Net -2020 ml   Filed Weights   05/09/21 2145  Weight: 95.3 kg    Examination:  EOMI NCAT icteric Slightly frail No distress Abdomen soft mild ascites some shifting dullness CTA B no added sound no rales rhonchi ROM intact Not tremulous   Data Reviewed: personally reviewed   CBC    Component Value Date/Time   WBC 7.9 05/10/2021 0327   RBC 3.40 (L) 05/10/2021 0327   HGB 11.9 (L) 05/10/2021 0327   HCT 35.2 (L) 05/10/2021 0327   PLT 69 (L) 05/10/2021 0327   MCV 103.5 (H) 05/10/2021 0327   MCH 35.0 (H) 05/10/2021 0327   MCHC 33.8 05/10/2021 0327   RDW 16.4 (H) 05/10/2021 0327   LYMPHSABS 0.6 (L) 05/09/2021  1252   MONOABS 1.3 (H) 05/09/2021 1252   EOSABS 0.1 05/09/2021 1252   BASOSABS 0.0 05/09/2021 1252   CMP Latest Ref Rng & Units 05/10/2021 05/09/2021 07/25/2019  Glucose 70 - 99 mg/dL 118(H) 84 115(H)  BUN 6 - 20 mg/dL '10 13 19  '$ Creatinine 0.61 - 1.24 mg/dL 0.84 0.86 0.70  Sodium 135 - 145 mmol/L 132(L) 130(L) 135  Potassium 3.5 - 5.1 mmol/L 3.3(L) 3.8 4.2  Chloride 98 - 111 mmol/L 102 95(L) 103  CO2 22 - 32 mmol/L '25 23 25  '$ Calcium 8.9 - 10.3 mg/dL 8.3(L) 8.2(L) 8.7(L)  Total Protein 6.5 - 8.1 g/dL 4.8(L) 5.1(L) -  Total  Bilirubin 0.3 - 1.2 mg/dL 8.2(H) 8.7(H) -  Alkaline Phos 38 - 126 U/L 200(H) 217(H) -  AST 15 - 41 U/L 100(H) 134(H) -  ALT 0 - 44 U/L 116(H) 141(H) -     Radiology Studies: CT HEAD WO CONTRAST (5MM)  Result Date: 05/09/2021 CLINICAL DATA:  Delirium, altered mental status EXAM: CT HEAD WITHOUT CONTRAST TECHNIQUE: Contiguous axial images were obtained from the base of the skull through the vertex without intravenous contrast. COMPARISON:  07/21/2019, 11/29/2008 FINDINGS: Brain: Stable mild atrophy pattern. No acute intracranial hemorrhage, mass lesion, acute infarction, midline shift, herniation, hydrocephalus, or extra-axial fluid collection. Cisterns are patent. Cerebellar atrophy as well. Vascular: No hyperdense vessel or unexpected calcification. Skull: Normal. Negative for fracture or focal lesion. Sinuses/Orbits: No acute finding. Other: None. IMPRESSION: Stable atrophy pattern. No acute intracranial abnormality by noncontrast CT. Electronically Signed   By: Jerilynn Mages.  Shick M.D.   On: 05/09/2021 14:48   CT ABDOMEN PELVIS W CONTRAST  Result Date: 05/09/2021 CLINICAL DATA:  Fever and abdominal pain, postoperative. History of [place] post ablation 1 week ago. Laparoscopy 1 week ago. EXAM: CT ABDOMEN AND PELVIS WITH CONTRAST TECHNIQUE: Multidetector CT imaging of the abdomen and pelvis was performed using the standard protocol following bolus administration of intravenous contrast. CONTRAST:  39m OMNIPAQUE IOHEXOL 350 MG/ML SOLN COMPARISON:  Abdominal ultrasound 05/09/2021. MRI abdomen 01/21/2015. FINDINGS: Lower chest: There is atelectasis in the lung bases. Hepatobiliary: Gallstones are present. There is gallbladder wall thickening/edema diffusely. There is no definite biliary ductal dilatation. There is cirrhotic liver contour. Branching hypodensity throughout the liver may represent intrahepatic biliary ductal dilatation, periportal edema or abnormal portal or hepatic veins. There are 2 hypodense lobulated  heterogeneous areas in the inferior right lobe of the liver which may represent recently treated hepatic masses. The more inferior mass measures 5.2 by 4.5 cm. The mass located more superiorly measures 4.8 by 4.2 cm. Pancreas: Unremarkable. No pancreatic ductal dilatation or surrounding inflammatory changes. Spleen: Mildly enlarged, similar to the prior study. Adrenals/Urinary Tract: The adrenal glands are within normal limits. There is no hydronephrosis or perinephric fluid. There is a rounded hypodensity in the right kidney which is too small to characterize likely cyst. The bladder is grossly within normal limits, but evaluation of the bladder is limited secondary to streak artifact in the pelvis. Stomach/Bowel: There is marked wall thickening of the ascending colon and transverse colon. The appendix appears within normal limits. The distal colon appears within normal limits. There is also some wall thickening of small bowel loops throughout the mid abdomen which are mildly dilated measuring up to 3.2 cm. Questionable pneumatosis identified within left-sided small bowel loops image 3/57. There is a small amount of free air in the upper abdomen. Vascular/Lymphatic: Aorta and IVC are normal in size. Splenic varices and large splenorenal  shunt are again noted. Superior mesenteric vein grossly patent. Portal vein is not visualized and portal vein thrombosis is not excluded. Reproductive: Prostate is unremarkable. Other: There is a moderate amount of ascites. There is diffuse body wall edema. Large fluid collection visualized in the left scrotal region. There is a small umbilical hernia containing fat and air. Bilateral gynecomastia. Musculoskeletal: The bones are diffusely osteopenic. There is an acute or subacute fracture of the superior endplate of L4 with [id number] loss vertebral body height. There is minimal retropulsion of fracture fragments. There is trace compression deformity of the superior endplate of 624THL which  appears chronic, but is age indeterminate left hip arthroplasty is present. IMPRESSION: 1. There is free air in the upper abdomen. This may be related to recent laparoscopy; however, given other abnormal bowel findings listed below, bowel perforation cannot be excluded. 2. Marked wall thickening of the ascending colon and transverse colon worrisome for nonspecific colitis including infectious, inflammatory and ischemic etiologies. 3. Wall thickening and mild dilatation of small-bowel loops in the central abdomen with questionable pneumatosis. Findings may represent ischemic enteritis. Other etiologies for enteritis not excluded. 4. Moderate amount of ascites. 5. Findings compatible with cirrhosis and portal hypertension. There are 2 lesions in the right lobe of the liver likely related to recent treated cancer. 6. Cholelithiasis. There is diffuse gallbladder wall edema which may be reactive secondary to 2 ascites/hepatocellular disease; however, cholecystitis cannot be excluded. 7. The portal vein is not opacified. Portal vein thrombosis is not excluded. 8. Splenomegaly and varices appear grossly unchanged compatible with portal systemic hypertension. 9. Acute or subacute compression fracture superior endplate of L4. 10. Large amount of left scrotal fluid. Recommend clinical correlation and follow-up. 11. These results were called by telephone at the time of interpretation on 05/09/2021 at 10:02 pm to provider Dr. Regenia Skeeter, who verbally acknowledged these results. Electronically Signed   By: Ronney Asters M.D.   On: 05/09/2021 22:06   DG Chest Portable 1 View  Result Date: 05/09/2021 CLINICAL DATA:  Altered mental status, history cirrhosis EXAM: PORTABLE CHEST 1 VIEW COMPARISON:  Portable exam 1345 hours compared to 07/21/2019 FINDINGS: Normal heart size, mediastinal contours, and pulmonary vascularity. Lungs clear. No infiltrate, pleural effusion, or pneumothorax. No acute osseous findings. IMPRESSION: No acute  abnormalities. Electronically Signed   By: Lavonia Jariel M.D.   On: 05/09/2021 14:03   US Abdomen Limited RUQ (LIVER/GB)  Result Date: 05/09/2021 CLINICAL DATA:  Right upper quadrant pain. Altered mental status. History of [place] post ablation 1 week ago. EXAM: ULTRASOUND ABDOMEN LIMITED RIGHT UPPER QUADRANT COMPARISON:  No recent imaging. Most recent CT available 12/24/2014. FINDINGS: Gallbladder: Shadowing gallstones. Diffuse wall thickening up to 7 mm. No sonographic Murphy sign noted by sonographer. Common bile duct: Diameter: Not well visualized, but likely 3-4 mm. Liver: Cirrhotic hepatic morphology with nodular contours and diffusely increased parenchymal echogenicity. There are 2 hypoechoic lesions, 1 in the anterior right lobe measuring 3.8 x 3.7 x 2.8 cm. Additional lesion in the posterior right lobe measures 3.9 x 3.1 x 3.6 cm. Nonspecific rind of increased echogenicity in the periphery of the right lobe adjacent to 1 of these liver lesions. Portal vein is patent on color Doppler imaging with reversal of blood flow away from the liver. Other: Right upper quadrant ascites, with fluid appearing relatively simple. IMPRESSION: 1. Hepatic cirrhosis. Two hypodense liver lesions. One of these lesions has a nonspecific rind of increased echogenicity adjacent which is nonspecific by ultrasound. There is no interval  comparison imaging since 2016, and findings are nonspecific. Given clinical concern for procedure complications such as bleeding, recommend abdominopelvic CT (with IV contrast in the absence of contraindication). 2. Right upper quadrant ascites, appears simple. 3. Gallstones. Diffuse gallbladder wall thickening, nonspecific but likely due to chronic liver disease. 4. Reversal of blood flow in the main portal vein. Electronically Signed   By: Keith Rake M.D.   On: 05/09/2021 18:20     Scheduled Meds:  furosemide  80 mg Oral BID   lactulose  30 g Oral TID   levETIRAcetam  1,500 mg Oral BID    nicotine  14 mg Transdermal Daily   potassium chloride  10 mEq Oral BID   rOPINIRole  0.5 mg Oral QHS   spironolactone  200 mg Oral q morning   Continuous Infusions:  piperacillin-tazobactam (ZOSYN)  IV       LOS: 0 days   Time spent: 75  Nita Sells, MD [facility] To contact the attending provider between 7A-7P or the covering provider during after hours 7P-7A, please log into the web site [email or web address] and access using universal [place] password for that web site. If you do not have the password, please call the hospital operator.  05/10/2021, 7:06 AM

## 2021-05-10 NOTE — ED Notes (Signed)
Pt was ambulatory to bathroom  

## 2021-05-11 ENCOUNTER — Ambulatory Visit: Payer: 59 | Admitting: Neurology

## 2021-05-11 LAB — CBC WITH DIFFERENTIAL/PLATELET
Abs Immature Granulocytes: 0.07 10*3/uL (ref 0.00–0.07)
Basophils Absolute: 0 10*3/uL (ref 0.0–0.1)
Basophils Relative: 1 %
Eosinophils Absolute: 0.2 10*3/uL (ref 0.0–0.5)
Eosinophils Relative: 3 %
HCT: 34 % — ABNORMAL LOW (ref 39.0–52.0)
Hemoglobin: 11.8 g/dL — ABNORMAL LOW (ref 13.0–17.0)
Immature Granulocytes: 1 %
Lymphocytes Relative: 10 %
Lymphs Abs: 0.8 10*3/uL (ref 0.7–4.0)
MCH: 35 pg — ABNORMAL HIGH (ref 26.0–34.0)
MCHC: 34.7 g/dL (ref 30.0–36.0)
MCV: 100.9 fL — ABNORMAL HIGH (ref 80.0–100.0)
Monocytes Absolute: 1.6 10*3/uL — ABNORMAL HIGH (ref 0.1–1.0)
Monocytes Relative: 22 %
Neutro Abs: 4.7 10*3/uL (ref 1.7–7.7)
Neutrophils Relative %: 63 %
Platelets: 68 10*3/uL — ABNORMAL LOW (ref 150–400)
RBC: 3.37 MIL/uL — ABNORMAL LOW (ref 4.22–5.81)
RDW: 16 % — ABNORMAL HIGH (ref 11.5–15.5)
WBC: 7.4 10*3/uL (ref 4.0–10.5)
nRBC: 0 % (ref 0.0–0.2)

## 2021-05-11 LAB — COMPREHENSIVE METABOLIC PANEL
ALT: 89 U/L — ABNORMAL HIGH (ref 0–44)
AST: 74 U/L — ABNORMAL HIGH (ref 15–41)
Albumin: 1.7 g/dL — ABNORMAL LOW (ref 3.5–5.0)
Alkaline Phosphatase: 172 U/L — ABNORMAL HIGH (ref 38–126)
Anion gap: 6 (ref 5–15)
BUN: 8 mg/dL (ref 6–20)
CO2: 26 mmol/L (ref 22–32)
Calcium: 8 mg/dL — ABNORMAL LOW (ref 8.9–10.3)
Chloride: 98 mmol/L (ref 98–111)
Creatinine, Ser: 0.82 mg/dL (ref 0.61–1.24)
GFR, Estimated: >60 mL/min (ref >60)
Glucose, Bld: 117 mg/dL — ABNORMAL HIGH (ref 70–99)
Potassium: 2.9 mmol/L — ABNORMAL LOW (ref 3.5–5.1)
Sodium: 130 mmol/L — ABNORMAL LOW (ref 135–145)
Total Bilirubin: 6.1 mg/dL — ABNORMAL HIGH (ref 0.3–1.2)
Total Protein: 4.2 g/dL — ABNORMAL LOW (ref 6.5–8.1)

## 2021-05-11 LAB — VITAMIN B12: Vitamin B-12: 2463 pg/mL — ABNORMAL HIGH (ref 180–914)

## 2021-05-11 LAB — POTASSIUM: Potassium: 3.4 mmol/L — ABNORMAL LOW (ref 3.5–5.1)

## 2021-05-11 MED ORDER — POTASSIUM CHLORIDE CRYS ER 20 MEQ PO TBCR: 40.0000 meq | EXTENDED_RELEASE_TABLET | ORAL | Status: DC

## 2021-05-11 MED ORDER — ROPINIROLE HCL 1 MG PO TABS: 1.0000 mg | ORAL_TABLET | Freq: Every day | ORAL | Status: DC

## 2021-05-11 MED ORDER — LACTULOSE 10 GM/15ML PO SOLN: 20.0000 g | Freq: Three times a day (TID) | ORAL | 0 refills | Status: AC

## 2021-05-11 MED ORDER — POTASSIUM CHLORIDE CRYS ER 20 MEQ PO TBCR
40.0000 meq | EXTENDED_RELEASE_TABLET | Freq: Two times a day (BID) | ORAL | Status: DC
  Administered 2021-05-11: 40 meq via ORAL
  Filled 2021-05-11: qty 2

## 2021-05-11 MED ORDER — POTASSIUM CHLORIDE CRYS ER 20 MEQ PO TBCR
40.0000 meq | EXTENDED_RELEASE_TABLET | Freq: Once | ORAL | Status: AC
  Administered 2021-05-11: 40 meq via ORAL
  Filled 2021-05-11: qty 2

## 2021-05-11 MED ORDER — HYDROMORPHONE HCL 1 MG/ML IJ SOLN
1.0000 mg | INTRAMUSCULAR | Status: AC | PRN
  Administered 2021-05-11 (×2): 1 mg via INTRAVENOUS
  Filled 2021-05-11 (×2): qty 1

## 2021-05-11 MED ORDER — TRAZODONE HCL 50 MG PO TABS
50.0000 mg | ORAL_TABLET | Freq: Once | ORAL | Status: AC
  Administered 2021-05-11: 50 mg via ORAL

## 2021-05-11 MED ORDER — MAGNESIUM OXIDE -MG SUPPLEMENT 400 (240 MG) MG PO TABS
400.0000 mg | ORAL_TABLET | Freq: Once | ORAL | Status: AC
  Administered 2021-05-11: 400 mg via ORAL
  Filled 2021-05-11: qty 1

## 2021-05-11 MED ORDER — OXYCODONE HCL 5 MG PO TABS: 5.0000 mg | ORAL_TABLET | ORAL | Status: DC | PRN

## 2021-05-11 NOTE — Evaluation (Signed)
Physical Therapy Evaluation Patient Details Name: [name] MRN: [id number] DOB: 12-23-1965 Today's Date: 05/11/2021   History of Present Illness  55yo male who presented on 05/09/21 with AMS. Had lap ablation of recurrent [place] at [facility] on 8/30. Admitted with hepatic encephalopathy. PMH bipolar, polysubstance abuse, hepatocellular carcinoma s/p TACE, seizures, HLD, L THA, R TKR  Clinical Impression   Patient received in bed, very cooperative and pleasant today. Did need help with urinal due to hand cramping and managing scrotal edema. Otherwise able to mobilize on a MinA level without device, improved to S level with use of RW/BUE support. Agreeable to using this device at home. Left up in recliner with all needs met, RN present and attending. Will really benefit from skilled HHPT f/u at DC.     Follow Up Recommendations Home health PT    Equipment Recommendations  Rolling walker with 5" wheels;3in1 (PT)    Recommendations for Other Services       Precautions / Restrictions Precautions Precautions: Fall Precaution Comments: hand cramps. scrotal edema Restrictions Weight Bearing Restrictions: No      Mobility  Bed Mobility Overal bed mobility: Modified Independent             General bed mobility comments: HOB elevated, + rail    Transfers Overall transfer level: Modified independent Equipment used: None Transfers: Sit to/from [facility] Sit to Stand: Modified independent (Device/Increase time);From elevated surface Stand pivot transfers: Min assist       General transfer comment: good awareness of hand placement, reports elevated seats at home; did need MInA for balance without BUE support, improved to general S with RW  Ambulation/Gait Ambulation/Gait assistance: Supervision Gait Distance (Feet): 400 Feet Assistive device: Rolling walker (2 wheeled) Gait Pattern/deviations: Step-through pattern;Decreased step length - right;Decreased step  length - left;Trunk flexed;Decreased stride length Gait velocity: decreased   General Gait Details: slow but steady with RW  Stairs            Wheelchair Mobility    Modified Rankin (Stroke Patients Only)       Balance Overall balance assessment: Needs assistance Sitting-balance support: Bilateral upper extremity supported;Feet supported Sitting balance-Leahy Scale: Good     Standing balance support: No upper extremity supported;During functional activity Standing balance-Leahy Scale: Poor Standing balance comment: reliant on BUE support for dynamic balance                             Pertinent Vitals/Pain Pain Assessment: No/denies pain    Home Living Family/patient expects to be discharged to:: Private residence Living Arrangements: Non-relatives/Friends Available Help at Discharge: Family;Available 24 hours/day (friend is disabled) Type of Home: House Home Access: Stairs to enter Entrance Stairs-Rails: Chemical engineer of Steps: 3 small steps Home Layout: One level Home Equipment: Tub bench;Bedside commode;Cane - single point Additional Comments: manages bills and meds, does not drive (room mate does)    Prior Function Level of Independence: Independent               Hand Dominance        Extremity/Trunk Assessment   Upper Extremity Assessment Upper Extremity Assessment: Generalized weakness    Lower Extremity Assessment Lower Extremity Assessment: Generalized weakness    Cervical / Trunk Assessment Cervical / Trunk Assessment: Kyphotic  Communication   Communication: No difficulties  Cognition Arousal/Alertness: Awake/alert Behavior During Therapy: WFL for tasks assessed/performed Overall Cognitive Status: Within Functional Limits for tasks assessed  General Comments      Exercises     Assessment/Plan    PT Assessment Patient needs continued PT  services  PT Problem List Decreased strength;Decreased safety awareness;Decreased balance;Decreased mobility       PT Treatment Interventions DME instruction;Balance training;Gait training;Stair training;Functional mobility training;Patient/family education;Therapeutic activities;Therapeutic exercise    PT Goals (Current goals can be found in the Care Plan section)  Acute Rehab PT Goals Patient Stated Goal: go home today PT Goal Formulation: With patient Time For Goal Achievement: 05/25/21 Potential to Achieve Goals: Good    Frequency Min 3X/week   Barriers to discharge        Co-evaluation               AM-PAC PT "6 Clicks" Mobility  Outcome Measure Help needed turning from your back to your side while in a flat bed without using bedrails?: A Little Help needed moving from lying on your back to sitting on the side of a flat bed without using bedrails?: A Little Help needed moving to and from a bed to a chair (including a wheelchair)?: A Little Help needed standing up from a chair using your arms (e.g., wheelchair or bedside chair)?: A Little Help needed to walk in hospital room?: A Little Help needed climbing 3-5 steps with a railing? : A Little 6 Click Score: 18    End of Session   Activity Tolerance: Patient tolerated treatment well Patient left: in chair;with call bell/phone within reach;with nursing/sitter in room Nurse Communication: Mobility status PT Visit Diagnosis: Unsteadiness on feet (R26.81);Muscle weakness (generalized) (M62.81);Difficulty in walking, not elsewhere classified (R26.2)    Time: 0037-0488 PT Time Calculation (min) (ACUTE ONLY): 33 min   Charges:   PT Evaluation $PT Eval Moderate Complexity: 1 Mod PT Treatments $Gait Training: 8-22 mins    Windell Norfolk, DPT, PN2   Supplemental Physical Therapist [place]    Pager [phone] Acute Rehab Office [phone]

## 2021-05-11 NOTE — Progress Notes (Signed)
AVS was given to and reviewed with patient. RN reviewed future appointments, home health arrangements and medications/prescriptions. All questions from patient were answered. PIV's were removed and dressed with no sign of infection or complications. Patient was able to bathe and dress himself with only supervision. Patient was discharged via wheelchair with roommate, Audelia Acton.

## 2021-05-11 NOTE — Discharge Summary (Signed)
Physician Discharge Summary  [name] [id number] DOB: [birth year] DOA: 05/09/2021  PCP: Cher Nakai, MD  Admit date: 05/09/2021 Discharge date: 05/11/2021  Admitted From: home Disposition:  home  Recommendations for Outpatient Follow-up:  Obtain follow up POTASSIUM LEVEL TOMORROW  Follow up with PCP in 1-2 weeks Please obtain BMP/CBC in one week Please follow up TOMORROW with Dr. Drue Novel, [place]: No  Equipment/Devices: None   Discharge Condition: Stable  CODE STATUS: Full  Diet recommendation: Low sodium    Discharge Diagnoses: Principal Problem:   Hepatic encephalopathy ([place]) Active Problems:   Seizures ([place])   Alcoholic cirrhosis of liver with ascites ([place])   Hepatocellular carcinoma ([place])   Tobacco dependence    Summary of HPI and Hospital Course:  Per Dr. 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  Mild confusion this AM after IV Dilaudid given, improved as it wore off. --Continue Lactulose at 20 g TID and titrate for at least 2-3 loose stools per day --GI was consulted, agreed with lactulose --Rifaximin was added  Liver cirrhosis from alcoholism, Wilsons disease MELD score about 25. --Continue Lasix p.o. 80 twice daily and Aldactone 200 --S/P Paracentesis on 9/6 with 2.8 L fluid removed. --Follow ascitic fluid cytology --Follow up with Dr. Drue Novel, GI and [facility] tomorrow. --Surgery post-op follow up as scheduled  Hypokalemia - improved  with replacement.  Acute drop this AM after very large BM's from lactulose. Repeat K level this afternoon. Patient has labs ordered for tomorrow by his outpatient GI physician.    Colitis on imaging - treated with empiric antibiotics.  No abdominal pain, N/V.  General surgery evaluated and felt abdomen was non-acute.  Chronic seizure disorder - UDS negative Continue Keppra 1500 BID, Requip 0.5 nightly    Discharge Instructions   Discharge Instructions     Call MD for:   Complete by: As directed    Worsening confusion, lethargy, hallucinations - signs of high ammonia level.   Call MD for:  extreme fatigue   Complete by: As directed    Call MD for:  persistant dizziness or light-headedness   Complete by: As directed    Call MD for:  persistant nausea and vomiting   Complete by: As directed    Call MD for:  severe uncontrolled pain   Complete by: As directed    Call MD for:  temperature >100.4   Complete by: As directed    Diet - low sodium heart healthy   Complete by: As directed    Discharge instructions   Complete by: As directed    It is very important that you get labs re-checked tomorrow, as ordered by Dr. Drue Novel. Your potassium level this morning was low and we've given you enough potassium today. But you'll need to have it re-checked.  I ordered a repeat level for 3 pm today here in the hospital.   If you are able to stay until they've drawn your blood, that would be preferable.    Please go to your scheduled follow up appointments with your [facility] doctors.  Lactulose - we increased to THREE  times daily.  The goal is to having about 3 loose bowel movements every day, to prevent build up of ammonia and worsening confusion.   Contact Dr. Finis Bud office if you have questions about how much lactulose to take.   Increase activity slowly   Complete by: As directed       Allergies as of 05/11/2021       Reactions   Codeine Nausea And Vomiting, Other (See Comments)    Reports it makes me crazy.        Medication List     TAKE these medications    furosemide 40 MG tablet Commonly known as: Lasix Take 2 tablets (80 mg total) by mouth 2 (two) times daily.   lactulose 10 GM/15ML solution Commonly known as: CHRONULAC Take 30 mLs (20 g total) by mouth 3 (three) times daily. What changed: when to take this   levETIRAcetam 500 MG tablet Commonly known as: KEPPRA Take 3 tablets (1,500 mg total) by mouth 2 (two) times daily.   oxyCODONE 5 MG immediate release tablet Commonly known as: Oxy IR/ROXICODONE Take 5 mg by mouth every 4 (four) hours as needed for pain.   potassium chloride 10 MEQ tablet Commonly known as: KLOR-CON TAKE ONE TABLET BY MOUTH TWICE DAILY What changed: how much to take   rOPINIRole 1 MG tablet Commonly known as: REQUIP Take 1 mg by mouth at bedtime.   spironolactone 100 MG tablet Commonly known as: ALDACTONE Take 2 tablets (200 mg total) by mouth every morning.   zinc gluconate 50 MG tablet Take 50 mg by mouth 3 (three) times daily.        Allergies  Allergen Reactions   Codeine Nausea And Vomiting and Other (See Comments)    Reports it makes me crazy.      If you experience worsening of your admission symptoms, develop shortness of breath, life threatening emergency, suicidal or homicidal thoughts you must seek medical attention immediately by calling 911 or calling your MD immediately  if symptoms less severe.    Please note   You were cared for by a hospitalist during your hospital stay. If you have any questions about your discharge medications or the care you received while you were in the hospital after you are discharged, you can call the unit and asked to speak with the hospitalist on call if the hospitalist that took care of you is not available. Once you are discharged, your primary care physician will handle any further medical issues. Please note that NO REFILLS for any discharge medications will  be authorized once you are discharged, as it is imperative that you return to your primary care physician (or establish a relationship with a primary care physician if you do not have one) for your aftercare needs so that they can reassess your need for medications and monitor your lab values.   Consultations: Gastroenterology General surgery    Procedures/Studies: CT HEAD WO CONTRAST (5MM)  Result Date: 05/09/2021 CLINICAL DATA:  Delirium, altered mental status EXAM: CT HEAD WITHOUT CONTRAST TECHNIQUE: Contiguous axial images were obtained from the base of the skull through the vertex without intravenous contrast. COMPARISON:  07/21/2019, 11/29/2008 FINDINGS: Brain: Stable mild atrophy pattern. No acute intracranial hemorrhage, mass lesion, acute infarction, midline shift, herniation, hydrocephalus, or extra-axial fluid collection. Cisterns are patent. Cerebellar atrophy as well. Vascular: No hyperdense vessel or unexpected calcification. Skull: Normal. Negative for fracture or focal lesion. Sinuses/Orbits: No acute finding. Other: None. IMPRESSION: Stable atrophy pattern. No  acute intracranial abnormality by noncontrast CT. Electronically Signed   By: Jerilynn Mages.  Shick M.D.   On: 05/09/2021 14:48   CT ABDOMEN PELVIS W CONTRAST  Result Date: 05/09/2021 CLINICAL DATA:  Fever and abdominal pain, postoperative. History of [place] post ablation 1 week ago. Laparoscopy 1 week ago. EXAM: CT ABDOMEN AND PELVIS WITH CONTRAST TECHNIQUE: Multidetector CT imaging of the abdomen and pelvis was performed using the standard protocol following bolus administration of intravenous contrast. CONTRAST:  31m OMNIPAQUE IOHEXOL 350 MG/ML SOLN COMPARISON:  Abdominal ultrasound 05/09/2021. MRI abdomen 01/21/2015. FINDINGS: Lower chest: There is atelectasis in the lung bases. Hepatobiliary: Gallstones are present. There is gallbladder wall thickening/edema diffusely. There is no definite biliary ductal dilatation. There is cirrhotic  liver contour. Branching hypodensity throughout the liver may represent intrahepatic biliary ductal dilatation, periportal edema or abnormal portal or hepatic veins. There are 2 hypodense lobulated heterogeneous areas in the inferior right lobe of the liver which may represent recently treated hepatic masses. The more inferior mass measures 5.2 by 4.5 cm. The mass located more superiorly measures 4.8 by 4.2 cm. Pancreas: Unremarkable. No pancreatic ductal dilatation or surrounding inflammatory changes. Spleen: Mildly enlarged, similar to the prior study. Adrenals/Urinary Tract: The adrenal glands are within normal limits. There is no hydronephrosis or perinephric fluid. There is a rounded hypodensity in the right kidney which is too small to characterize likely cyst. The bladder is grossly within normal limits, but evaluation of the bladder is limited secondary to streak artifact in the pelvis. Stomach/Bowel: There is marked wall thickening of the ascending colon and transverse colon. The appendix appears within normal limits. The distal colon appears within normal limits. There is also some wall thickening of small bowel loops throughout the mid abdomen which are mildly dilated measuring up to 3.2 cm. Questionable pneumatosis identified within left-sided small bowel loops image 3/57. There is a small amount of free air in the upper abdomen. Vascular/Lymphatic: Aorta and IVC are normal in size. Splenic varices and large splenorenal shunt are again noted. Superior mesenteric vein grossly patent. Portal vein is not visualized and portal vein thrombosis is not excluded. Reproductive: Prostate is unremarkable. Other: There is a moderate amount of ascites. There is diffuse body wall edema. Large fluid collection visualized in the left scrotal region. There is a small umbilical hernia containing fat and air. Bilateral gynecomastia. Musculoskeletal: The bones are diffusely osteopenic. There is an acute or subacute fracture  of the superior endplate of L4 with [id number] vertebral body height. There is minimal retropulsion of fracture fragments. There is trace compression deformity of the superior endplate of [id number] appears chronic, but is age indeterminate left hip arthroplasty is present. IMPRESSION: 1. There is free air in the upper abdomen. This may be related to recent laparoscopy; however, given other abnormal bowel findings listed below, bowel perforation cannot be excluded. 2. Marked wall thickening of the ascending colon and transverse colon worrisome for nonspecific colitis including infectious, inflammatory and ischemic etiologies. 3. Wall thickening and mild dilatation of small-bowel loops in the central abdomen with questionable pneumatosis. Findings may represent ischemic enteritis. Other etiologies for enteritis not excluded. 4. Moderate amount of ascites. 5. Findings compatible with cirrhosis and portal hypertension. There are 2 lesions in the right lobe of the liver likely related to recent treated cancer. 6. Cholelithiasis. There is diffuse gallbladder wall edema which may be reactive secondary to 2 ascites/hepatocellular disease; however, cholecystitis cannot be excluded. 7. The portal vein is not opacified. Portal vein thrombosis  is not excluded. 8. Splenomegaly and varices appear grossly unchanged compatible with portal systemic hypertension. 9. Acute or subacute compression fracture superior endplate of L4. 10. Large amount of left scrotal fluid. Recommend clinical correlation and follow-up. 11. These results were called by telephone at the time of interpretation on 05/09/2021 at 10:02 pm to provider Dr. Regenia Skeeter, who verbally acknowledged these results. Electronically Signed   By: Ronney Asters M.D.   On: 05/09/2021 22:06   DG Chest Portable 1 View  Result Date: 05/09/2021 CLINICAL DATA:  Altered mental status, history cirrhosis EXAM: PORTABLE CHEST 1 VIEW COMPARISON:  Portable exam 1345 hours compared to  07/21/2019 FINDINGS: Normal heart size, mediastinal contours, and pulmonary vascularity. Lungs clear. No infiltrate, pleural effusion, or pneumothorax. No acute osseous findings. IMPRESSION: No acute abnormalities. Electronically Signed   By: Lavonia Davyon M.D.   On: 05/09/2021 14:03   US Abdomen Limited RUQ (LIVER/GB)  Result Date: 05/09/2021 CLINICAL DATA:  Right upper quadrant pain. Altered mental status. History of [place] post ablation 1 week ago. EXAM: ULTRASOUND ABDOMEN LIMITED RIGHT UPPER QUADRANT COMPARISON:  No recent imaging. Most recent CT available 12/24/2014. FINDINGS: Gallbladder: Shadowing gallstones. Diffuse wall thickening up to 7 mm. No sonographic Murphy sign noted by sonographer. Common bile duct: Diameter: Not well visualized, but likely 3-4 mm. Liver: Cirrhotic hepatic morphology with nodular contours and diffusely increased parenchymal echogenicity. There are 2 hypoechoic lesions, 1 in the anterior right lobe measuring 3.8 x 3.7 x 2.8 cm. Additional lesion in the posterior right lobe measures 3.9 x 3.1 x 3.6 cm. Nonspecific rind of increased echogenicity in the periphery of the right lobe adjacent to 1 of these liver lesions. Portal vein is patent on color Doppler imaging with reversal of blood flow away from the liver. Other: Right upper quadrant ascites, with fluid appearing relatively simple. IMPRESSION: 1. Hepatic cirrhosis. Two hypodense liver lesions. One of these lesions has a nonspecific rind of increased echogenicity adjacent which is nonspecific by ultrasound. There is no interval comparison imaging since 2016, and findings are nonspecific. Given clinical concern for procedure complications such as bleeding, recommend abdominopelvic CT (with IV contrast in the absence of contraindication). 2. Right upper quadrant ascites, appears simple. 3. Gallstones. Diffuse gallbladder wall thickening, nonspecific but likely due to chronic liver disease. 4. Reversal of blood flow in the main portal  vein. Electronically Signed   By: Keith Rake M.D.   On: 05/09/2021 18:20   IR Paracentesis  Result Date: 05/10/2021 INDICATION: Patient with a history of cirrhosis, hepatocellular carcinoma and recurrent ascites since today for diagnostic and therapeutic paracentesis. EXAM: ULTRASOUND GUIDED PARACENTESIS MEDICATIONS: 1% lidocaine 10 mL COMPLICATIONS: None immediate. PROCEDURE: Informed written consent was obtained from the patient after a discussion of the risks, benefits and alternatives to treatment. A timeout was performed prior to the initiation of the procedure. Initial ultrasound scanning demonstrates a large amount of ascites within the left lower abdominal quadrant. The left lower abdomen was prepped and draped in the usual sterile fashion. 1% lidocaine was used for local anesthesia. Following this, a 19 gauge, 7-cm, Yueh catheter was introduced. An ultrasound image was saved for documentation purposes. The paracentesis was performed. The catheter was removed and a dressing was applied. The patient tolerated the procedure well without immediate post procedural complication. FINDINGS: A total of approximately 2.8 L of yellow fluid was removed. Samples were sent to the laboratory as requested by the clinical team. IMPRESSION: Successful ultrasound-guided paracentesis yielding 2.8 liters of peritoneal fluid. Read  by: Soyla Dryer, NP Electronically Signed   By: Corrie Mckusick D.O.   On: 05/10/2021 16:35     Subjective: Pt has slow speech which RN reports near his baseline but little worse after he was given IV Dilaudid this AM.  Pt reports a HUGE BM.  He states needs to d/c today so he can keep his appt tomorrow.   Agreeable to stay for repeat potassium level this afternoon, given he dropped so much overnight.  Otherwise says feeling well and denies any acute complaints.    Discharge Exam: Vitals:   05/10/21 2107 05/11/21 0621  BP: 127/73 116/65  Pulse: 81 86  Resp: 17 18  Temp: 98.4 F  (36.9 C) 98 F (36.7 C)  SpO2: 99% 97%   Vitals:   05/10/21 1329 05/10/21 1709 05/10/21 2107 05/11/21 0621  BP: 134/83 130/72 127/73 116/65  Pulse: 71 75 81 86  Resp: '20 18 17 18  '$ Temp: 97.8 F (36.6 C) (!) 97.5 F (36.4 C) 98.4 F (36.9 C) 98 F (36.7 C)  TempSrc: Oral Oral Oral Oral  SpO2: 100% 98% 99% 97%  Weight:   95.3 kg   Height:        General: Pt is alert, awake, not in acute distress Cardiovascular: RRR, S1/S2 +, no rubs, no gallops Respiratory: CTA bilaterally, no wheezing, no rhonchi Abdominal: Soft, NT, ND, bowel sounds + Extremities: no edema, no cyanosis    The results of significant diagnostics from this hospitalization (including imaging, microbiology, ancillary and laboratory) are listed below for reference.     Microbiology: Recent Results (from the past 240 hour(s))  SARS CORONAVIRUS 2 (TAT 6-24 HRS) Nasopharyngeal Nasopharyngeal Swab     Status: None   Collection Time: 05/09/21  5:29 PM   Specimen: Nasopharyngeal Swab  Result Value Ref Range Status   SARS Coronavirus 2 NEGATIVE NEGATIVE Final    Comment: (NOTE) SARS-CoV-2 target nucleic acids are NOT DETECTED.  The SARS-CoV-2 RNA is generally detectable in upper and lower respiratory specimens during the acute phase of infection. Negative results do not preclude SARS-CoV-2 infection, do not rule out co-infections with other pathogens, and should not be used as the sole basis for treatment or other patient management decisions. Negative results must be combined with clinical observations, patient history, and epidemiological information. The expected result is Negative.  Fact Sheet for Patients: SugarRoll.be  Fact Sheet for Healthcare Providers: https://www.woods-mathews.com/  This test is not yet approved or cleared by the [place] FDA and  has been authorized for detection and/or diagnosis of SARS-CoV-2 by FDA under an Emergency Use  Authorization (EUA). This EUA will remain  in effect (meaning this test can be used) for the duration of the COVID-19 declaration under Se ction 564(b)(1) of the Act, 21 U.S.C. section 360bbb-3(b)(1), unless the authorization is terminated or revoked sooner.  Performed at [facility], [place] [address], [place], [place] [address]      Labs: BNP (last 3 results) No results for input(s): BNP in the last 8760 hours. Basic Metabolic Panel: Recent Labs  Lab 05/09/21 1252 05/10/21 0327 05/11/21 0308  NA 130* 132* 130*  K 3.8 3.3* 2.9*  CL 95* 102 98  CO2 '23 25 26  '$ GLUCOSE 84 118* 117*  BUN '13 10 8  '$ CREATININE 0.86 0.84 0.82  CALCIUM 8.2* 8.3* 8.0*   Liver Function Tests: Recent Labs  Lab 05/09/21 1252 05/10/21 0327 05/11/21 0308  AST 134* 100* 74*  ALT 141* 116* 89*  ALKPHOS 217* 200* 172*  BILITOT 8.7* 8.2* 6.1*  PROT 5.1* 4.8* 4.2*  ALBUMIN 2.2* 2.0* 1.7*   No results for input(s): LIPASE, AMYLASE in the last 168 hours. Recent Labs  Lab 05/09/21 1252  AMMONIA 64*   CBC: Recent Labs  Lab 05/09/21 1252 05/10/21 0327 05/11/21 0308  WBC 10.5 7.9 7.4  NEUTROABS 8.4*  --  4.7  HGB 12.4* 11.9* 11.8*  HCT 38.6* 35.2* 34.0*  MCV 107.5* 103.5* 100.9*  PLT PLATELET CLUMPS NOTED ON SMEAR, UNABLE TO ESTIMATE 69* 68*   Cardiac Enzymes: No results for input(s): CKTOTAL, CKMB, CKMBINDEX, TROPONINI in the last 168 hours. BNP: Invalid input(s): POCBNP CBG: No results for input(s): GLUCAP in the last 168 hours. D-Dimer No results for input(s): DDIMER in the last 72 hours. Hgb A1c No results for input(s): HGBA1C in the last 72 hours. Lipid Profile No results for input(s): CHOL, HDL, LDLCALC, TRIG, CHOLHDL, LDLDIRECT in the last 72 hours. Thyroid function studies No results for input(s): TSH, T4TOTAL, T3FREE, THYROIDAB in the last 72 hours.  Invalid input(s): FREET3 Anemia work up Recent Labs    05/11/21 0308  VITAMINB12 2,463*   Urinalysis    Component  Value Date/Time   COLORURINE AMBER (A) 05/09/2021 [place] 05/09/2021 1605   LABSPEC 1.012 05/09/2021 1605   PHURINE 7.0 05/09/2021 1605   GLUCOSEU NEGATIVE 05/09/2021 [place] 06/11/2019 1128   HGBUR NEGATIVE 05/09/2021 1605   BILIRUBINUR NEGATIVE 05/09/2021 1605   KETONESUR NEGATIVE 05/09/2021 1605   PROTEINUR NEGATIVE 05/09/2021 1605   UROBILINOGEN 2.0 (A) 06/11/2019 1128   NITRITE NEGATIVE 05/09/2021 1605   LEUKOCYTESUR NEGATIVE 05/09/2021 1605   Sepsis Labs Invalid input(s): PROCALCITONIN,  WBC,  LACTICIDVEN Microbiology Recent Results (from the past 240 hour(s))  SARS CORONAVIRUS 2 (TAT 6-24 HRS) Nasopharyngeal Nasopharyngeal Swab     Status: None   Collection Time: 05/09/21  5:29 PM   Specimen: Nasopharyngeal Swab  Result Value Ref Range Status   SARS Coronavirus 2 NEGATIVE NEGATIVE Final    Comment: (NOTE) SARS-CoV-2 target nucleic acids are NOT DETECTED.  The SARS-CoV-2 RNA is generally detectable in upper and lower respiratory specimens during the acute phase of infection. Negative results do not preclude SARS-CoV-2 infection, do not rule out co-infections with other pathogens, and should not be used as the sole basis for treatment or other patient management decisions. Negative results must be combined with clinical observations, patient history, and epidemiological information. The expected result is Negative.  Fact Sheet for Patients: SugarRoll.be  Fact Sheet for Healthcare Providers: https://www.woods-mathews.com/  This test is not yet approved or cleared by the [place] FDA and  has been authorized for detection and/or diagnosis of SARS-CoV-2 by FDA under an Emergency Use Authorization (EUA). This EUA will remain  in effect (meaning this test can be used) for the duration of the COVID-19 declaration under Se ction 564(b)(1) of the Act, 21 U.S.C. section 360bbb-3(b)(1), unless  the authorization is terminated or revoked sooner.  Performed at [facility], [place] [address] [place], [place] [address]      Time coordinating discharge: Over 30 minutes  SIGNED:   Ezekiel Slocumb, DO [facility] 05/11/2021, 11:57 AM   If 7PM-7AM, please contact night-coverage [email or web address]

## 2021-05-11 NOTE — Progress Notes (Signed)
Progress Note     Subjective: Alert and oriented this am and he states he is feeling much improved. He denies abdominal pain, nausea or vomiting and has had a BM  Objective: Vital signs in last 24 hours: Temp:  [97.5 F (36.4 C)-98.4 F (36.9 C)] 98 F (36.7 C) (09/07 0621) Pulse Rate:  [64-86] 86 (09/07 0621) Resp:  [17-20] 18 (09/07 0621) BP: (116-134)/(65-83) 116/65 (09/07 0621) SpO2:  [97 %-100 %] 97 % (09/07 0621) Weight:  [95.3 kg] 95.3 kg (09/06 2107) Last BM Date: 05/10/21  Intake/Output from previous day: 09/06 0701 - 09/07 0700 In: 641.1 [P.O.:600; IV Piggyback:41.1] Out: 1850 [Urine:1850] Intake/Output this shift: Total I/O In: 300 [P.O.:300] Out: 125 [Urine:125]  PE: General: pleasant, WD, male who is laying in bed in NAD HEENT: head is normocephalic, atraumatic. Mouth is pink and moist Heart: Palpable radial and pedal pulses bilaterally Lungs: Respiratory effort nonlabored Abd: soft, NT, +BS, no masses, hernias, or organomegaly. Moderate distension MSK: all 4 extremities are symmetrical with no cyanosis, clubbing, or edema. Skin: warm and dry with no masses, lesions, or rashes Psych: A&Ox3 with an appropriate affect.    Lab Results:  Recent Labs    05/10/21 0327 05/11/21 0308  WBC 7.9 7.4  HGB 11.9* 11.8*  HCT 35.2* 34.0*  PLT 69* 68*   BMET Recent Labs    05/10/21 0327 05/11/21 0308  NA 132* 130*  K 3.3* 2.9*  CL 102 98  CO2 25 26  GLUCOSE 118* 117*  BUN 10 8  CREATININE 0.84 0.82  CALCIUM 8.3* 8.0*   PT/INR Recent Labs    05/09/21 1252 05/10/21 0327  LABPROT 20.3* 20.1*  INR 1.7* 1.7*   CMP     Component Value Date/Time   NA 130 (L) 05/11/2021 0308   K 2.9 (L) 05/11/2021 0308   CL 98 05/11/2021 0308   CO2 26 05/11/2021 0308   GLUCOSE 117 (H) 05/11/2021 0308   BUN 8 05/11/2021 0308   CREATININE 0.82 05/11/2021 0308   CALCIUM 8.0 (L) 05/11/2021 0308   PROT 4.2 (L) 05/11/2021 0308   ALBUMIN 1.7 (L) 05/11/2021 0308    AST 74 (H) 05/11/2021 0308   ALT 89 (H) 05/11/2021 0308   ALKPHOS 172 (H) 05/11/2021 0308   BILITOT 6.1 (H) 05/11/2021 0308   GFRNONAA >60 05/11/2021 0308   GFRAA >60 07/25/2019 0425   Lipase     Component Value Date/Time   LIPASE 40.0 04/30/2014 1726       Studies/Results: CT HEAD WO CONTRAST (5MM)  Result Date: 05/09/2021 CLINICAL DATA:  Delirium, altered mental status EXAM: CT HEAD WITHOUT CONTRAST TECHNIQUE: Contiguous axial images were obtained from the base of the skull through the vertex without intravenous contrast. COMPARISON:  07/21/2019, 11/29/2008 FINDINGS: Brain: Stable mild atrophy pattern. No acute intracranial hemorrhage, mass lesion, acute infarction, midline shift, herniation, hydrocephalus, or extra-axial fluid collection. Cisterns are patent. Cerebellar atrophy as well. Vascular: No hyperdense vessel or unexpected calcification. Skull: Normal. Negative for fracture or focal lesion. Sinuses/Orbits: No acute finding. Other: None. IMPRESSION: Stable atrophy pattern. No acute intracranial abnormality by noncontrast CT. Electronically Signed   By: Jerilynn Mages.  Shick M.D.   On: 05/09/2021 14:48   CT ABDOMEN PELVIS W CONTRAST  Result Date: 05/09/2021 CLINICAL DATA:  Fever and abdominal pain, postoperative. History of [place] post ablation 1 week ago. Laparoscopy 1 week ago. EXAM: CT ABDOMEN AND PELVIS WITH CONTRAST TECHNIQUE: Multidetector CT imaging of the abdomen and pelvis was performed using  the standard protocol following bolus administration of intravenous contrast. CONTRAST:  68m OMNIPAQUE IOHEXOL 350 MG/ML SOLN COMPARISON:  Abdominal ultrasound 05/09/2021. MRI abdomen 01/21/2015. FINDINGS: Lower chest: There is atelectasis in the lung bases. Hepatobiliary: Gallstones are present. There is gallbladder wall thickening/edema diffusely. There is no definite biliary ductal dilatation. There is cirrhotic liver contour. Branching hypodensity throughout the liver may represent intrahepatic  biliary ductal dilatation, periportal edema or abnormal portal or hepatic veins. There are 2 hypodense lobulated heterogeneous areas in the inferior right lobe of the liver which may represent recently treated hepatic masses. The more inferior mass measures 5.2 by 4.5 cm. The mass located more superiorly measures 4.8 by 4.2 cm. Pancreas: Unremarkable. No pancreatic ductal dilatation or surrounding inflammatory changes. Spleen: Mildly enlarged, similar to the prior study. Adrenals/Urinary Tract: The adrenal glands are within normal limits. There is no hydronephrosis or perinephric fluid. There is a rounded hypodensity in the right kidney which is too small to characterize likely cyst. The bladder is grossly within normal limits, but evaluation of the bladder is limited secondary to streak artifact in the pelvis. Stomach/Bowel: There is marked wall thickening of the ascending colon and transverse colon. The appendix appears within normal limits. The distal colon appears within normal limits. There is also some wall thickening of small bowel loops throughout the mid abdomen which are mildly dilated measuring up to 3.2 cm. Questionable pneumatosis identified within left-sided small bowel loops image 3/57. There is a small amount of free air in the upper abdomen. Vascular/Lymphatic: Aorta and IVC are normal in size. Splenic varices and large splenorenal shunt are again noted. Superior mesenteric vein grossly patent. Portal vein is not visualized and portal vein thrombosis is not excluded. Reproductive: Prostate is unremarkable. Other: There is a moderate amount of ascites. There is diffuse body wall edema. Large fluid collection visualized in the left scrotal region. There is a small umbilical hernia containing fat and air. Bilateral gynecomastia. Musculoskeletal: The bones are diffusely osteopenic. There is an acute or subacute fracture of the superior endplate of L4 with [id number] vertebral body height. There is minimal  retropulsion of fracture fragments. There is trace compression deformity of the superior endplate of [id number] appears chronic, but is age indeterminate left hip arthroplasty is present. IMPRESSION: 1. There is free air in the upper abdomen. This may be related to recent laparoscopy; however, given other abnormal bowel findings listed below, bowel perforation cannot be excluded. 2. Marked wall thickening of the ascending colon and transverse colon worrisome for nonspecific colitis including infectious, inflammatory and ischemic etiologies. 3. Wall thickening and mild dilatation of small-bowel loops in the central abdomen with questionable pneumatosis. Findings may represent ischemic enteritis. Other etiologies for enteritis not excluded. 4. Moderate amount of ascites. 5. Findings compatible with cirrhosis and portal hypertension. There are 2 lesions in the right lobe of the liver likely related to recent treated cancer. 6. Cholelithiasis. There is diffuse gallbladder wall edema which may be reactive secondary to 2 ascites/hepatocellular disease; however, cholecystitis cannot be excluded. 7. The portal vein is not opacified. Portal vein thrombosis is not excluded. 8. Splenomegaly and varices appear grossly unchanged compatible with portal systemic hypertension. 9. Acute or subacute compression fracture superior endplate of L4. 10. Large amount of left scrotal fluid. Recommend clinical correlation and follow-up. 11. These results were called by telephone at the time of interpretation on 05/09/2021 at 10:02 pm to provider Dr. GRegenia Skeeter who verbally acknowledged these results. Electronically Signed   By: AWarren Lacy  Dagoberto Reef M.D.   On: 05/09/2021 22:06   DG Chest Portable 1 View  Result Date: 05/09/2021 CLINICAL DATA:  Altered mental status, history cirrhosis EXAM: PORTABLE CHEST 1 VIEW COMPARISON:  Portable exam 1345 hours compared to 07/21/2019 FINDINGS: Normal heart size, mediastinal contours, and pulmonary vascularity.  Lungs clear. No infiltrate, pleural effusion, or pneumothorax. No acute osseous findings. IMPRESSION: No acute abnormalities. Electronically Signed   By: Lavonia Ladarion M.D.   On: 05/09/2021 14:03   US Abdomen Limited RUQ (LIVER/GB)  Result Date: 05/09/2021 CLINICAL DATA:  Right upper quadrant pain. Altered mental status. History of [place] post ablation 1 week ago. EXAM: ULTRASOUND ABDOMEN LIMITED RIGHT UPPER QUADRANT COMPARISON:  No recent imaging. Most recent CT available 12/24/2014. FINDINGS: Gallbladder: Shadowing gallstones. Diffuse wall thickening up to 7 mm. No sonographic Murphy sign noted by sonographer. Common bile duct: Diameter: Not well visualized, but likely 3-4 mm. Liver: Cirrhotic hepatic morphology with nodular contours and diffusely increased parenchymal echogenicity. There are 2 hypoechoic lesions, 1 in the anterior right lobe measuring 3.8 x 3.7 x 2.8 cm. Additional lesion in the posterior right lobe measures 3.9 x 3.1 x 3.6 cm. Nonspecific rind of increased echogenicity in the periphery of the right lobe adjacent to 1 of these liver lesions. Portal vein is patent on color Doppler imaging with reversal of blood flow away from the liver. Other: Right upper quadrant ascites, with fluid appearing relatively simple. IMPRESSION: 1. Hepatic cirrhosis. Two hypodense liver lesions. One of these lesions has a nonspecific rind of increased echogenicity adjacent which is nonspecific by ultrasound. There is no interval comparison imaging since 2016, and findings are nonspecific. Given clinical concern for procedure complications such as bleeding, recommend abdominopelvic CT (with IV contrast in the absence of contraindication). 2. Right upper quadrant ascites, appears simple. 3. Gallstones. Diffuse gallbladder wall thickening, nonspecific but likely due to chronic liver disease. 4. Reversal of blood flow in the main portal vein. Electronically Signed   By: Keith Rake M.D.   On: 05/09/2021 18:20   IR  Paracentesis  Result Date: 05/10/2021 INDICATION: Patient with a history of cirrhosis, hepatocellular carcinoma and recurrent ascites since today for diagnostic and therapeutic paracentesis. EXAM: ULTRASOUND GUIDED PARACENTESIS MEDICATIONS: 1% lidocaine 10 mL COMPLICATIONS: None immediate. PROCEDURE: Informed written consent was obtained from the patient after a discussion of the risks, benefits and alternatives to treatment. A timeout was performed prior to the initiation of the procedure. Initial ultrasound scanning demonstrates a large amount of ascites within the left lower abdominal quadrant. The left lower abdomen was prepped and draped in the usual sterile fashion. 1% lidocaine was used for local anesthesia. Following this, a 19 gauge, 7-cm, Yueh catheter was introduced. An ultrasound image was saved for documentation purposes. The paracentesis was performed. The catheter was removed and a dressing was applied. The patient tolerated the procedure well without immediate post procedural complication. FINDINGS: A total of approximately 2.8 L of yellow fluid was removed. Samples were sent to the laboratory as requested by the clinical team. IMPRESSION: Successful ultrasound-guided paracentesis yielding 2.8 liters of peritoneal fluid. Read by: Soyla Dryer, NP Electronically Signed   By: Corrie Mckusick D.O.   On: 05/10/2021 16:35    Anti-infectives: Anti-infectives (From admission, onward)    Start     Dose/Rate Route Frequency Ordered Stop   05/10/21 0700  piperacillin-tazobactam (ZOSYN) IVPB 3.375 g  Status:  Discontinued       See Hyperspace for full Linked Orders Report.   3.375 g  12.5 mL/hr over 240 Minutes Intravenous Every 8 hours 05/09/21 2242 05/10/21 1730   05/09/21 2311  piperacillin-tazobactam (ZOSYN) IVPB 3.375 g       See Hyperspace for full Linked Orders Report.   3.375 g 100 mL/hr over 30 Minutes Intravenous  Once 05/09/21 2242 05/10/21 0115         Assessment/Plan Decompensated cirrhosis and HCC Recent laparoscopic ablation (8/30) Right colon thickening  - no abdominal pain and patient is feeling significantly improved. No acute surgical indications at this time and from patient sounds like hopeful for soon discharge. Reasonable to cover with 7 days of antibiotics to cover for colitis given right colon thickening. Pending discharge, should surgical needs arise would recommend transfer to [facility] where he receives care   LOS: 1 day    Winferd Humphrey, [facility] Surgery 05/11/2021, 9:57 AM Please see Amion for pager number during day hours 7:00am-4:30pm

## 2021-05-11 NOTE — TOC Transition Note (Signed)
Transition of Care [facility]) - CM/SW Discharge Note   Patient Details  Name: [name] MRN: [id number] Date of Birth: 1966/01/31  [facility] CM/SW Contact:  Tom-Johnson, Renea Ee, RN Phone Number: 05/11/2021, 3:20 PM   Clinical Narrative:    CM consulted for home health services at discharge. Spoke with patient at bedside. States he lives with his roommate/friend and his children lives in [place]. Has transportation services with [facility] and sometime his friend takes him to his appointments. States he has a walker and a cane. Needs a bedside commode. Spoke with Freda Munro with [employer] [phone]) and will deliver at bedside. List of home health agencies from Medicare.gov given to patient and he chose [facility] services. Cory with [facility] [phone]) called and agreed with PT/OT referral. Friend to transport at discharge. No further TOC needs at this time.  Final next level of care: [place] Barriers to Discharge: No Barriers Identified   Patient Goals and CMS Choice Patient states their goals for this hospitalization and ongoing recovery are:: To go home CMS Medicare.gov Compare Post Acute Care list provided to:: Patient Choice offered to / list presented to : Patient  Discharge Placement                       Discharge Plan and Services                          HH Arranged: PT, OT [facility] Agency: [place] Date [place]: 05/11/21 Time [place]: [id number] Representative spoke with at [place]: [place] ([place]) Interventions     Readmission Risk Interventions No flowsheet data found.

## 2021-05-12 ENCOUNTER — Ambulatory Visit (HOSPITAL_COMMUNITY): Admission: RE | Admit: 2021-05-12 | Payer: 59 | Source: Ambulatory Visit

## 2021-05-12 ENCOUNTER — Encounter (HOSPITAL_COMMUNITY): Payer: Self-pay

## 2021-05-12 LAB — FOLATE RBC
Folate, Hemolysate: >620 ng/mL
Folate, RBC: >1975 ng/mL (ref >498)
Hematocrit: 31.4 % — ABNORMAL LOW (ref 37.5–51.0)

## 2021-05-12 LAB — PATHOLOGIST SMEAR REVIEW

## 2021-05-12 LAB — CYTOLOGY - NON PAP

## 2021-05-12 LAB — HEMATOLOGY COMMENTS:

## 2021-05-13 ENCOUNTER — Inpatient Hospital Stay (HOSPITAL_COMMUNITY): Payer: 59

## 2021-05-13 ENCOUNTER — Other Ambulatory Visit: Payer: Self-pay

## 2021-05-13 ENCOUNTER — Encounter (HOSPITAL_COMMUNITY): Payer: Self-pay | Admitting: Emergency Medicine

## 2021-05-13 ENCOUNTER — Inpatient Hospital Stay (HOSPITAL_COMMUNITY)
Admission: EM | Admit: 2021-05-13 | Discharge: 2021-05-17 | DRG: 433 | Disposition: A | Discharge status: Home / Self Care | Payer: 59 | Attending: Internal Medicine | Admitting: Internal Medicine

## 2021-05-13 DIAGNOSIS — R7989 Other specified abnormal findings of blood chemistry: Secondary | ICD-10-CM

## 2021-05-13 DIAGNOSIS — Z8249 Family history of ischemic heart disease and other diseases of the circulatory system: Secondary | ICD-10-CM

## 2021-05-13 DIAGNOSIS — F1010 Alcohol abuse, uncomplicated: Secondary | ICD-10-CM | POA: Diagnosis present

## 2021-05-13 DIAGNOSIS — Z79899 Other long term (current) drug therapy: Secondary | ICD-10-CM | POA: Diagnosis not present

## 2021-05-13 DIAGNOSIS — K769 Liver disease, unspecified: Secondary | ICD-10-CM | POA: Diagnosis not present

## 2021-05-13 DIAGNOSIS — D689 Coagulation defect, unspecified: Secondary | ICD-10-CM | POA: Diagnosis present

## 2021-05-13 DIAGNOSIS — G40909 Epilepsy, unspecified, not intractable, without status epilepticus: Secondary | ICD-10-CM | POA: Diagnosis present

## 2021-05-13 DIAGNOSIS — C220 Liver cell carcinoma: Secondary | ICD-10-CM

## 2021-05-13 DIAGNOSIS — Z20822 Contact with and (suspected) exposure to covid-19: Secondary | ICD-10-CM | POA: Diagnosis present

## 2021-05-13 DIAGNOSIS — E871 Hypo-osmolality and hyponatremia: Secondary | ICD-10-CM

## 2021-05-13 DIAGNOSIS — Z885 Allergy status to narcotic agent status: Secondary | ICD-10-CM

## 2021-05-13 DIAGNOSIS — F17210 Nicotine dependence, cigarettes, uncomplicated: Secondary | ICD-10-CM | POA: Diagnosis present

## 2021-05-13 DIAGNOSIS — K7290 Hepatic failure, unspecified without coma: Secondary | ICD-10-CM | POA: Diagnosis present

## 2021-05-13 DIAGNOSIS — K7040 Alcoholic hepatic failure without coma: Secondary | ICD-10-CM | POA: Diagnosis present

## 2021-05-13 DIAGNOSIS — K7460 Unspecified cirrhosis of liver: Secondary | ICD-10-CM | POA: Diagnosis present

## 2021-05-13 DIAGNOSIS — D696 Thrombocytopenia, unspecified: Secondary | ICD-10-CM | POA: Diagnosis present

## 2021-05-13 DIAGNOSIS — R4182 Altered mental status, unspecified: Secondary | ICD-10-CM

## 2021-05-13 DIAGNOSIS — Z833 Family history of diabetes mellitus: Secondary | ICD-10-CM | POA: Diagnosis not present

## 2021-05-13 DIAGNOSIS — E7220 Disorder of urea cycle metabolism, unspecified: Secondary | ICD-10-CM | POA: Diagnosis not present

## 2021-05-13 DIAGNOSIS — Z8505 Personal history of malignant neoplasm of liver: Secondary | ICD-10-CM | POA: Diagnosis not present

## 2021-05-13 DIAGNOSIS — K7031 Alcoholic cirrhosis of liver with ascites: Secondary | ICD-10-CM | POA: Diagnosis present

## 2021-05-13 DIAGNOSIS — Y900 Blood alcohol level of less than 20 mg/100 ml: Secondary | ICD-10-CM | POA: Diagnosis present

## 2021-05-13 DIAGNOSIS — K7682 Hepatic encephalopathy: Secondary | ICD-10-CM | POA: Diagnosis present

## 2021-05-13 DIAGNOSIS — Z96643 Presence of artificial hip joint, bilateral: Secondary | ICD-10-CM | POA: Diagnosis present

## 2021-05-13 DIAGNOSIS — Z8659 Personal history of other mental and behavioral disorders: Secondary | ICD-10-CM

## 2021-05-13 DIAGNOSIS — K709 Alcoholic liver disease, unspecified: Secondary | ICD-10-CM

## 2021-05-13 LAB — COMPREHENSIVE METABOLIC PANEL
ALT: 108 U/L — ABNORMAL HIGH (ref 0–44)
AST: 130 U/L — ABNORMAL HIGH (ref 15–41)
Albumin: 2.5 g/dL — ABNORMAL LOW (ref 3.5–5.0)
Alkaline Phosphatase: 217 U/L — ABNORMAL HIGH (ref 38–126)
Anion gap: 11 (ref 5–15)
BUN: 16 mg/dL (ref 6–20)
CO2: 24 mmol/L (ref 22–32)
Calcium: 8.7 mg/dL — ABNORMAL LOW (ref 8.9–10.3)
Chloride: 89 mmol/L — ABNORMAL LOW (ref 98–111)
Creatinine, Ser: 0.72 mg/dL (ref 0.61–1.24)
GFR, Estimated: >60 mL/min (ref >60)
Glucose, Bld: 79 mg/dL (ref 70–99)
Potassium: 3.7 mmol/L (ref 3.5–5.1)
Sodium: 124 mmol/L — ABNORMAL LOW (ref 135–145)
Total Bilirubin: 13.2 mg/dL — ABNORMAL HIGH (ref 0.3–1.2)
Total Protein: 5.8 g/dL — ABNORMAL LOW (ref 6.5–8.1)

## 2021-05-13 LAB — CBC WITH DIFFERENTIAL/PLATELET
Abs Immature Granulocytes: 0.2 10*3/uL — ABNORMAL HIGH (ref 0.00–0.07)
Basophils Absolute: 0.1 10*3/uL (ref 0.0–0.1)
Basophils Relative: 1 %
Eosinophils Absolute: 0.2 10*3/uL (ref 0.0–0.5)
Eosinophils Relative: 2 %
HCT: 36.6 % — ABNORMAL LOW (ref 39.0–52.0)
Hemoglobin: 12.6 g/dL — ABNORMAL LOW (ref 13.0–17.0)
Immature Granulocytes: 2 %
Lymphocytes Relative: 8 %
Lymphs Abs: 0.8 10*3/uL (ref 0.7–4.0)
MCH: 35.4 pg — ABNORMAL HIGH (ref 26.0–34.0)
MCHC: 34.4 g/dL (ref 30.0–36.0)
MCV: 102.8 fL — ABNORMAL HIGH (ref 80.0–100.0)
Monocytes Absolute: 1.4 10*3/uL — ABNORMAL HIGH (ref 0.1–1.0)
Monocytes Relative: 14 %
Neutro Abs: 8 10*3/uL — ABNORMAL HIGH (ref 1.7–7.7)
Neutrophils Relative %: 73 %
Platelets: 107 10*3/uL — ABNORMAL LOW (ref 150–400)
RBC: 3.56 MIL/uL — ABNORMAL LOW (ref 4.22–5.81)
RDW: 16.3 % — ABNORMAL HIGH (ref 11.5–15.5)
WBC: 10.7 10*3/uL — ABNORMAL HIGH (ref 4.0–10.5)
nRBC: 0 % (ref 0.0–0.2)

## 2021-05-13 LAB — RESP PANEL BY RT-PCR (FLU A&B, COVID) ARPGX2
Influenza A by PCR: NEGATIVE
Influenza B by PCR: NEGATIVE
SARS Coronavirus 2 by RT PCR: NEGATIVE

## 2021-05-13 LAB — URINALYSIS, ROUTINE W REFLEX MICROSCOPIC
Glucose, UA: NEGATIVE mg/dL
Ketones, ur: NEGATIVE mg/dL
Leukocytes,Ua: NEGATIVE
Nitrite: NEGATIVE
Protein, ur: NEGATIVE mg/dL
Specific Gravity, Urine: 1.01 (ref 1.005–1.030)
pH: 6.5 (ref 5.0–8.0)

## 2021-05-13 LAB — LIPASE, BLOOD: Lipase: 61 U/L — ABNORMAL HIGH (ref 11–51)

## 2021-05-13 LAB — PROTIME-INR
INR: 1.7 — ABNORMAL HIGH (ref 0.8–1.2)
Prothrombin Time: 19.9 seconds — ABNORMAL HIGH (ref 11.4–15.2)

## 2021-05-13 LAB — RAPID URINE DRUG SCREEN, HOSP PERFORMED
Amphetamines: NOT DETECTED
Barbiturates: NOT DETECTED
Benzodiazepines: NOT DETECTED
Cocaine: NOT DETECTED
Opiates: NOT DETECTED
Tetrahydrocannabinol: NOT DETECTED

## 2021-05-13 LAB — URINALYSIS, MICROSCOPIC (REFLEX): Bacteria, UA: NONE SEEN

## 2021-05-13 LAB — ETHANOL: Alcohol, Ethyl (B): <10 mg/dL (ref <10)

## 2021-05-13 LAB — AMMONIA: Ammonia: 36 umol/L — ABNORMAL HIGH (ref 9–35)

## 2021-05-13 LAB — SODIUM: Sodium: 128 mmol/L — ABNORMAL LOW (ref 135–145)

## 2021-05-13 MED ORDER — MELATONIN 3 MG PO TABS
3.0000 mg | ORAL_TABLET | Freq: Every day | ORAL | Status: DC
  Administered 2021-05-13 – 2021-05-16 (×4): 3 mg via ORAL
  Filled 2021-05-13 (×3): qty 1

## 2021-05-13 MED ORDER — LEVETIRACETAM 500 MG PO TABS
1500.0000 mg | ORAL_TABLET | Freq: Two times a day (BID) | ORAL | Status: DC
  Administered 2021-05-13 – 2021-05-17 (×8): 1500 mg via ORAL
  Filled 2021-05-13 (×9): qty 3

## 2021-05-13 MED ORDER — FUROSEMIDE 10 MG/ML IJ SOLN
40.0000 mg | Freq: Two times a day (BID) | INTRAMUSCULAR | Status: DC
  Administered 2021-05-13 – 2021-05-17 (×8): 40 mg via INTRAVENOUS
  Filled 2021-05-13 (×8): qty 4

## 2021-05-13 MED ORDER — SPIRONOLACTONE 25 MG PO TABS
200.0000 mg | ORAL_TABLET | Freq: Every morning | ORAL | Status: DC
  Administered 2021-05-14 – 2021-05-17 (×4): 200 mg via ORAL
  Filled 2021-05-13 (×4): qty 8

## 2021-05-13 MED ORDER — OXYCODONE HCL 5 MG PO TABS: 5.0000 mg | ORAL_TABLET | ORAL | Status: DC | PRN

## 2021-05-13 MED ORDER — CHOLESTYRAMINE 4 G PO PACK
4.0000 g | PACK | Freq: Two times a day (BID) | ORAL | Status: DC
  Administered 2021-05-13 – 2021-05-16 (×6): 4 g via ORAL
  Filled 2021-05-13 (×10): qty 1

## 2021-05-13 MED ORDER — BACITRACIN-NEOMYCIN-POLYMYXIN OINTMENT TUBE
1.0000 "application " | TOPICAL_OINTMENT | Freq: Every day | CUTANEOUS | Status: DC
  Administered 2021-05-14 – 2021-05-16 (×3): 1 via TOPICAL
  Filled 2021-05-13: qty 14

## 2021-05-13 MED ORDER — LACTULOSE 10 GM/15ML PO SOLN
30.0000 g | Freq: Once | ORAL | Status: AC
  Administered 2021-05-13: 30 g via ORAL
  Filled 2021-05-13: qty 45

## 2021-05-13 MED ORDER — ROPINIROLE HCL 1 MG PO TABS: 1.0000 mg | ORAL_TABLET | Freq: Every day | ORAL | Status: DC

## 2021-05-13 MED ORDER — BACLOFEN 10 MG PO TABS
10.0000 mg | ORAL_TABLET | Freq: Every day | ORAL | Status: DC
  Administered 2021-05-13 – 2021-05-16 (×4): 10 mg via ORAL
  Filled 2021-05-13 (×3): qty 1

## 2021-05-13 MED ORDER — RIFAXIMIN 550 MG PO TABS
550.0000 mg | ORAL_TABLET | Freq: Two times a day (BID) | ORAL | Status: DC
  Administered 2021-05-14 – 2021-05-17 (×7): 550 mg via ORAL
  Filled 2021-05-13 (×9): qty 1

## 2021-05-13 MED ORDER — SODIUM CHLORIDE 0.9 % IV BOLUS
500.0000 mL | Freq: Once | INTRAVENOUS | Status: AC
  Administered 2021-05-13: 500 mL via INTRAVENOUS

## 2021-05-13 MED ORDER — PHYTONADIONE 5 MG PO TABS
5.0000 mg | ORAL_TABLET | Freq: Once | ORAL | Status: DC
  Filled 2021-05-13: qty 1

## 2021-05-13 MED ORDER — POTASSIUM CHLORIDE ER 10 MEQ PO TBCR
10.0000 meq | EXTENDED_RELEASE_TABLET | Freq: Two times a day (BID) | ORAL | Status: DC
  Administered 2021-05-14 – 2021-05-15 (×4): 10 meq via ORAL
  Filled 2021-05-13 (×4): qty 1

## 2021-05-13 MED ORDER — LACTULOSE 10 GM/15ML PO SOLN
20.0000 g | Freq: Three times a day (TID) | ORAL | Status: DC
  Administered 2021-05-13 – 2021-05-15 (×5): 20 g via ORAL
  Filled 2021-05-13 (×6): qty 30

## 2021-05-13 NOTE — ED Notes (Signed)
Notified RN of pts Low BP

## 2021-05-13 NOTE — ED Triage Notes (Signed)
Pt here via [facility] from home for manic behavior. Pt's roommate and neighbors found him running around in the front yard naked. Pt had recent hospitalization for encephalopathy, hx liver CA, cirrhosis, jaundice, Wilson's disease, seizures, and bipolar. Aox4 but lethargic to answer. Pt denies pain, believes his behavior is related to his liver. 110/58, 99% RA, 86HR, CBG 78.

## 2021-05-13 NOTE — ED Notes (Signed)
Pt states he feels manic and that he has been compliant w/ meds, but endorses taking keppra for mania and also "doesn't want to be sent into a seizure"

## 2021-05-13 NOTE — ED Notes (Signed)
Brother Audelia Acton [phone] would like an update

## 2021-05-13 NOTE — ED Provider Notes (Signed)
[facility] EMERGENCY DEPARTMENT Provider Note   CSN: [id number] Arrival date & time: 05/13/21  1159     History Chief Complaint  Patient presents with   Altered Mental Status    [name] is a 55 y.o. male.  Patient with history hepatic ca, cirrhosis, c/o confusion, altered mental status today. Per report, was running about naked, acting strange/confused - pt indicates feels likely due to his ammonia level. Symptoms acute onset, moderate, persistent. Denies fever or chills. States compliant w home meds including lactulose, reports having regular bms including this AM. Denies abd pain or distension. No vomiting. Denies trauma or  fall. No headache. No chest pain or sob. EMS reports CBG 78 and vitals normal.   The history is provided by the patient, medical records and the EMS personnel.  Altered Mental Status Presenting symptoms: confusion   Associated symptoms: no abdominal pain, no fever, no headaches and no rash       Past Medical History:  Diagnosis Date   Abuse, drug or alcohol ([place])    Anemia    Bipolar affective disorder ([place])    [facility])    liver cancer treated with micrablation   Chronic liver disease and cirrhosis    Depression    DJD (degenerative joint disease)    right knee   Dyslipidemia    Encephalopathy    Esophageal varices ([place])    Hernia    History of alcohol abuse    PONV (postoperative nausea and vomiting)    Seizures ([place])    Dr Jannifer Franklin   Thrombocytopenia ([place])    Wilson disease    Wrist fracture    right    Patient Active Problem List   Diagnosis Date Noted   Tobacco dependence 05/09/2021   Left displaced femoral neck fracture ([place])    Closed fracture of left distal radius and ulna, initial encounter    Closed left hip fracture ([place]) 07/22/2019   Left wrist fracture 07/22/2019   Rib fracture 07/22/2019   Ascites [id number]   Alcoholic cirrhosis of liver with ascites ([place])  07/19/2018   Left hydrocele 0000000   Umbilical hernia without obstruction and without gangrene 07/19/2018   Displaced fracture of distal end of right radius 12/23/2016   Possible exposure to STD 06/07/2016   S/P total knee replacement using cement 07/15/2015   Bipolar affective disorder ([place]) 05/27/2015   Depression 05/27/2015   Thrombocytopenia ([place]) 05/27/2015   History of alcohol abuse 05/27/2015   Seizure ([place]) 05/27/2015   Brain disorder 05/27/2015   Chest pain 04/30/2015   Hepatocellular carcinoma ([place]) 02/15/2015   Left inguinal hernia 10/22/2014   Hyponatremia 05/06/2014   Diarrhea 04/30/2014   Wilson's disease 09/08/2013   UTI (urinary tract infection) 07/29/2013   Fracture of tibial plateau 07/24/2013   ED (erectile dysfunction) of organic origin 01/07/2013   Hypokalemia 10/21/2011   Eczema 10/21/2011   Impaired glucose tolerance 10/19/2011   Hepatic encephalopathy ([place]) 08/05/2011   Disorder of stomach and duodenum 08/03/2011   Esophageal varices ([place]) 08/03/2011   Portal hypertension ([place]) 08/03/2011   Seizures ([place])    Encephalopathy    Preventative health care 04/14/2011   Follow-up examination following surgery 02/23/2011   Chronic liver disease and cirrhosis ([place]) 06/10/2009    Past Surgical History:  Procedure Laterality Date   ESOPHAGOGASTRODUODENOSCOPY     HARDWARE REMOVAL Right 01/22/2015   Procedure: HARDWARE REMOVAL RIGHT LEG;  Surgeon: Leandrew Koyanagi, MD;  Location: [place];  Service: Orthopedics;  Laterality: Right;   IR PARACENTESIS  10/04/2018   IR PARACENTESIS  04/29/2019   IR PARACENTESIS  05/10/2021   left inguinal hernia  june 2012   [place]   liver cancer  02/2015   ablation   microablation of liver     OPEN REDUCTION INTERNAL FIXATION (ORIF) DISTAL RADIAL FRACTURE Right 12/21/2016   Procedure: OPEN REDUCTION INTERNAL FIXATION (ORIF) RIGHT DISTAL RADIUS FRACTURE;  Surgeon: Leandrew Koyanagi, MD;  Location: [place];  Service: Orthopedics;   Laterality: Right;   OPEN REDUCTION INTERNAL FIXATION (ORIF) TIBIA/FIBULA FRACTURE Right 07/25/2013   Procedure: OPEN REDUCTION INTERNAL FIXATION (ORIF) RIGHT TIBIAL PLATEAU, TIBIAL SHAFT AND FIBULA FRACTURES, POSSIBLE FASCIOTOMIES;  Surgeon: Marianna Payment, MD;  Location: [place];  Service: Orthopedics;  Laterality: Right;   ORIF WRIST FRACTURE Left 07/23/2019   Procedure: OPEN REDUCTION INTERNAL FIXATION (ORIF) WRIST FRACTURE;  Surgeon: Leandrew Koyanagi, MD;  Location: [place];  Service: Orthopedics;  Laterality: Left;   TOTAL HIP ARTHROPLASTY Left 07/23/2019   Procedure: TOTAL HIP ARTHROPLASTY ANTERIOR APPROACH;  Surgeon: Leandrew Koyanagi, MD;  Location: [place];  Service: Orthopedics;  Laterality: Left;   TOTAL KNEE ARTHROPLASTY Right 07/15/2015   Procedure: RIGHT TOTAL KNEE ARTHROPLASTY;  Surgeon: Leandrew Koyanagi, MD;  Location: [place];  Service: Orthopedics;  Laterality: Right;   VASCULAR SURGERY         Family History  Problem Relation Age of Onset   Heart disease Other    Diabetes Father    Dementia Mother    Glaucoma Brother     Social History   Tobacco Use   Smoking status: Every Day    Packs/day: 0.50    Types: Cigarettes   Smokeless tobacco: Never  Vaping Use   Vaping Use: Never used  Substance Use Topics   Alcohol use: No    Alcohol/week: 0.0 standard drinks    Comment: 3-4 bottles a week, quit drinking 2005   Drug use: No    Comment: last use 2005    Home Medications Prior to Admission medications   Medication Sig Start Date End Date Taking? Authorizing Provider  furosemide (LASIX) 40 MG tablet Take 2 tablets (80 mg total) by mouth 2 (two) times daily. 04/29/19   Biagio Borg, MD  lactulose (CHRONULAC) 10 GM/15ML solution Take 30 mLs (20 g total) by mouth 3 (three) times daily. 05/11/21   Ezekiel Slocumb, DO  levETIRAcetam (KEPPRA) 500 MG tablet Take 3 tablets (1,500 mg total) by mouth 2 (two) times daily. 12/20/20   Cameron Sprang, MD  oxyCODONE (OXY IR/ROXICODONE) 5  MG immediate release tablet Take 5 mg by mouth every 4 (four) hours as needed for pain. 05/03/21   [provider]  potassium chloride (KLOR-CON) 10 MEQ tablet TAKE ONE TABLET BY MOUTH TWICE DAILY Patient taking differently: Take 20 mEq by mouth 2 (two) times daily. 01/27/20   Biagio Borg, MD  rOPINIRole (REQUIP) 1 MG tablet Take 1 mg by mouth at bedtime.    [provider]  spironolactone (ALDACTONE) 100 MG tablet Take 2 tablets (200 mg total) by mouth every morning. 04/29/19   Biagio Borg, MD  zinc gluconate 50 MG tablet Take 50 mg by mouth 3 (three) times daily.     [provider]    Allergies    Codeine  Review of Systems   Review of Systems  Constitutional:  Negative for chills and  fever.  HENT:  Negative for sore throat.   Eyes:  Negative for redness.  Respiratory:  Negative for shortness of breath.   Cardiovascular:  Negative for chest pain.  Gastrointestinal:  Negative for abdominal pain.  Genitourinary:  Negative for dysuria and flank pain.  Musculoskeletal:  Negative for back pain and neck pain.  Skin:  Negative for rash.  Neurological:  Negative for headaches.  Hematological:  Does not bruise/bleed easily.  Psychiatric/Behavioral:  Positive for confusion.    Physical Exam Updated Vital Signs BP (!) 134/54 (BP Location: Left Arm)   Pulse 85   Temp 97.6 F (36.4 C) (Oral)   Resp 18   Ht 1.854 m ('6\' 1"'$ )   SpO2 100%   BMI 27.71 kg/m   Physical Exam Vitals and nursing note reviewed.  Constitutional:      Appearance: Normal appearance. He is well-developed.  HENT:     Head: Atraumatic.     Nose: Nose normal.     Mouth/Throat:     Mouth: Mucous membranes are moist.     Pharynx: Oropharynx is clear.  Eyes:     General: Scleral icterus present.     Conjunctiva/sclera: Conjunctivae normal.  Neck:     Trachea: No tracheal deviation.     Comments: No stiffness or rigidity.  Cardiovascular:     Rate and Rhythm: Normal rate and regular  rhythm.     Pulses: Normal pulses.     Heart sounds: Normal heart sounds. No murmur heard.   No friction rub. No gallop.  Pulmonary:     Effort: Pulmonary effort is normal. No accessory muscle usage or respiratory distress.     Breath sounds: Normal breath sounds.  Abdominal:     General: Bowel sounds are normal. There is no distension.     Palpations: Abdomen is soft.     Tenderness: There is no abdominal tenderness. There is no guarding.  Genitourinary:    Comments: No cva tenderness. Musculoskeletal:     Cervical back: Normal range of motion and neck supple. No rigidity.     Comments: Diffuse bilateral leg edema to proximal thighs.   Skin:    General: Skin is warm and dry.     Findings: No rash.  Neurological:     Mental Status: He is alert.     Comments: Alert, speech clear. Oriented to person/place. Motor/sens grossly intact bil.   Psychiatric:        Mood and Affect: Mood normal.    ED Results / Procedures / Treatments   Labs (all labs ordered are listed, but only abnormal results are displayed) Results for orders placed or performed during the hospital encounter of 05/13/21  Comprehensive metabolic panel  Result Value Ref Range   Sodium 124 (L) 135 - 145 mmol/L   Potassium 3.7 3.5 - 5.1 mmol/L   Chloride 89 (L) 98 - 111 mmol/L   CO2 24 22 - 32 mmol/L   Glucose, Bld 79 70 - 99 mg/dL   BUN 16 6 - 20 mg/dL   Creatinine, Ser 0.72 0.61 - 1.24 mg/dL   Calcium 8.7 (L) 8.9 - 10.3 mg/dL   Total Protein 5.8 (L) 6.5 - 8.1 g/dL   Albumin 2.5 (L) 3.5 - 5.0 g/dL   AST 130 (H) 15 - 41 U/L   ALT 108 (H) 0 - 44 U/L   Alkaline Phosphatase 217 (H) 38 - 126 U/L   Total Bilirubin 13.2 (H) 0.3 - 1.2 mg/dL   GFR, Estimated >  60 >60 mL/min   Anion gap 11 5 - 15  CBC with Differential  Result Value Ref Range   WBC 10.7 (H) 4.0 - 10.5 K/uL   RBC 3.56 (L) 4.22 - 5.81 MIL/uL   Hemoglobin 12.6 (L) 13.0 - 17.0 g/dL   HCT 36.6 (L) 39.0 - 52.0 %   MCV 102.8 (H) 80.0 - 100.0 fL   MCH 35.4  (H) 26.0 - 34.0 pg   MCHC 34.4 30.0 - 36.0 g/dL   RDW 16.3 (H) 11.5 - 15.5 %   Platelets 107 (L) 150 - 400 K/uL   nRBC 0.0 0.0 - 0.2 %   Neutrophils Relative % 73 %   Neutro Abs 8.0 (H) 1.7 - 7.7 K/uL   Lymphocytes Relative 8 %   Lymphs Abs 0.8 0.7 - 4.0 K/uL   Monocytes Relative 14 %   Monocytes Absolute 1.4 (H) 0.1 - 1.0 K/uL   Eosinophils Relative 2 %   Eosinophils Absolute 0.2 0.0 - 0.5 K/uL   Basophils Relative 1 %   Basophils Absolute 0.1 0.0 - 0.1 K/uL   Immature Granulocytes 2 %   Abs Immature Granulocytes 0.20 (H) 0.00 - 0.07 K/uL  Lipase, blood  Result Value Ref Range   Lipase 61 (H) 11 - 51 U/L  Protime-INR  Result Value Ref Range   Prothrombin Time 19.9 (H) 11.4 - 15.2 seconds   INR 1.7 (H) 0.8 - 1.2  Ammonia  Result Value Ref Range   Ammonia 36 (H) 9 - 35 umol/L  Ethanol  Result Value Ref Range   Alcohol, Ethyl (B) <10 <10 mg/dL   CT HEAD WO CONTRAST (5MM)  Result Date: 05/09/2021 CLINICAL DATA:  Delirium, altered mental status EXAM: CT HEAD WITHOUT CONTRAST TECHNIQUE: Contiguous axial images were obtained from the base of the skull through the vertex without intravenous contrast. COMPARISON:  07/21/2019, 11/29/2008 FINDINGS: Brain: Stable mild atrophy pattern. No acute intracranial hemorrhage, mass lesion, acute infarction, midline shift, herniation, hydrocephalus, or extra-axial fluid collection. Cisterns are patent. Cerebellar atrophy as well. Vascular: No hyperdense vessel or unexpected calcification. Skull: Normal. Negative for fracture or focal lesion. Sinuses/Orbits: No acute finding. Other: None. IMPRESSION: Stable atrophy pattern. No acute intracranial abnormality by noncontrast CT. Electronically Signed   By: Jerilynn Mages.  Shick M.D.   On: 05/09/2021 14:48   CT ABDOMEN PELVIS W CONTRAST  Result Date: 05/09/2021 CLINICAL DATA:  Fever and abdominal pain, postoperative. History of [place] post ablation 1 week ago. Laparoscopy 1 week ago. EXAM: CT ABDOMEN AND PELVIS WITH  CONTRAST TECHNIQUE: Multidetector CT imaging of the abdomen and pelvis was performed using the standard protocol following bolus administration of intravenous contrast. CONTRAST:  52m OMNIPAQUE IOHEXOL 350 MG/ML SOLN COMPARISON:  Abdominal ultrasound 05/09/2021. MRI abdomen 01/21/2015. FINDINGS: Lower chest: There is atelectasis in the lung bases. Hepatobiliary: Gallstones are present. There is gallbladder wall thickening/edema diffusely. There is no definite biliary ductal dilatation. There is cirrhotic liver contour. Branching hypodensity throughout the liver may represent intrahepatic biliary ductal dilatation, periportal edema or abnormal portal or hepatic veins. There are 2 hypodense lobulated heterogeneous areas in the inferior right lobe of the liver which may represent recently treated hepatic masses. The more inferior mass measures 5.2 by 4.5 cm. The mass located more superiorly measures 4.8 by 4.2 cm. Pancreas: Unremarkable. No pancreatic ductal dilatation or surrounding inflammatory changes. Spleen: Mildly enlarged, similar to the prior study. Adrenals/Urinary Tract: The adrenal glands are within normal limits. There is no hydronephrosis or perinephric fluid. There  is a rounded hypodensity in the right kidney which is too small to characterize likely cyst. The bladder is grossly within normal limits, but evaluation of the bladder is limited secondary to streak artifact in the pelvis. Stomach/Bowel: There is marked wall thickening of the ascending colon and transverse colon. The appendix appears within normal limits. The distal colon appears within normal limits. There is also some wall thickening of small bowel loops throughout the mid abdomen which are mildly dilated measuring up to 3.2 cm. Questionable pneumatosis identified within left-sided small bowel loops image 3/57. There is a small amount of free air in the upper abdomen. Vascular/Lymphatic: Aorta and IVC are normal in size. Splenic varices and  large splenorenal shunt are again noted. Superior mesenteric vein grossly patent. Portal vein is not visualized and portal vein thrombosis is not excluded. Reproductive: Prostate is unremarkable. Other: There is a moderate amount of ascites. There is diffuse body wall edema. Large fluid collection visualized in the left scrotal region. There is a small umbilical hernia containing fat and air. Bilateral gynecomastia. Musculoskeletal: The bones are diffusely osteopenic. There is an acute or subacute fracture of the superior endplate of L4 with [id number] loss vertebral body height. There is minimal retropulsion of fracture fragments. There is trace compression deformity of the superior endplate of 624THL which appears chronic, but is age indeterminate left hip arthroplasty is present. IMPRESSION: 1. There is free air in the upper abdomen. This may be related to recent laparoscopy; however, given other abnormal bowel findings listed below, bowel perforation cannot be excluded. 2. Marked wall thickening of the ascending colon and transverse colon worrisome for nonspecific colitis including infectious, inflammatory and ischemic etiologies. 3. Wall thickening and mild dilatation of small-bowel loops in the central abdomen with questionable pneumatosis. Findings may represent ischemic enteritis. Other etiologies for enteritis not excluded. 4. Moderate amount of ascites. 5. Findings compatible with cirrhosis and portal hypertension. There are 2 lesions in the right lobe of the liver likely related to recent treated cancer. 6. Cholelithiasis. There is diffuse gallbladder wall edema which may be reactive secondary to 2 ascites/hepatocellular disease; however, cholecystitis cannot be excluded. 7. The portal vein is not opacified. Portal vein thrombosis is not excluded. 8. Splenomegaly and varices appear grossly unchanged compatible with portal systemic hypertension. 9. Acute or subacute compression fracture superior endplate of L4. 10.  Large amount of left scrotal fluid. Recommend clinical correlation and follow-up. 11. These results were called by telephone at the time of interpretation on 05/09/2021 at 10:02 pm to provider Dr. Regenia Skeeter, who verbally acknowledged these results. Electronically Signed   By: Ronney Asters M.D.   On: 05/09/2021 22:06   DG Chest Portable 1 View  Result Date: 05/09/2021 CLINICAL DATA:  Altered mental status, history cirrhosis EXAM: PORTABLE CHEST 1 VIEW COMPARISON:  Portable exam 1345 hours compared to 07/21/2019 FINDINGS: Normal heart size, mediastinal contours, and pulmonary vascularity. Lungs clear. No infiltrate, pleural effusion, or pneumothorax. No acute osseous findings. IMPRESSION: No acute abnormalities. Electronically Signed   By: Lavonia Geovani M.D.   On: 05/09/2021 14:03   US Abdomen Limited RUQ (LIVER/GB)  Result Date: 05/09/2021 CLINICAL DATA:  Right upper quadrant pain. Altered mental status. History of [place] post ablation 1 week ago. EXAM: ULTRASOUND ABDOMEN LIMITED RIGHT UPPER QUADRANT COMPARISON:  No recent imaging. Most recent CT available 12/24/2014. FINDINGS: Gallbladder: Shadowing gallstones. Diffuse wall thickening up to 7 mm. No sonographic Murphy sign noted by sonographer. Common bile duct: Diameter: Not well visualized, but likely  3-4 mm. Liver: Cirrhotic hepatic morphology with nodular contours and diffusely increased parenchymal echogenicity. There are 2 hypoechoic lesions, 1 in the anterior right lobe measuring 3.8 x 3.7 x 2.8 cm. Additional lesion in the posterior right lobe measures 3.9 x 3.1 x 3.6 cm. Nonspecific rind of increased echogenicity in the periphery of the right lobe adjacent to 1 of these liver lesions. Portal vein is patent on color Doppler imaging with reversal of blood flow away from the liver. Other: Right upper quadrant ascites, with fluid appearing relatively simple. IMPRESSION: 1. Hepatic cirrhosis. Two hypodense liver lesions. One of these lesions has a nonspecific  rind of increased echogenicity adjacent which is nonspecific by ultrasound. There is no interval comparison imaging since 2016, and findings are nonspecific. Given clinical concern for procedure complications such as bleeding, recommend abdominopelvic CT (with IV contrast in the absence of contraindication). 2. Right upper quadrant ascites, appears simple. 3. Gallstones. Diffuse gallbladder wall thickening, nonspecific but likely due to chronic liver disease. 4. Reversal of blood flow in the main portal vein. Electronically Signed   By: Keith Rake M.D.   On: 05/09/2021 18:20   IR Paracentesis  Result Date: 05/10/2021 INDICATION: Patient with a history of cirrhosis, hepatocellular carcinoma and recurrent ascites since today for diagnostic and therapeutic paracentesis. EXAM: ULTRASOUND GUIDED PARACENTESIS MEDICATIONS: 1% lidocaine 10 mL COMPLICATIONS: None immediate. PROCEDURE: Informed written consent was obtained from the patient after a discussion of the risks, benefits and alternatives to treatment. A timeout was performed prior to the initiation of the procedure. Initial ultrasound scanning demonstrates a large amount of ascites within the left lower abdominal quadrant. The left lower abdomen was prepped and draped in the usual sterile fashion. 1% lidocaine was used for local anesthesia. Following this, a 19 gauge, 7-cm, Yueh catheter was introduced. An ultrasound image was saved for documentation purposes. The paracentesis was performed. The catheter was removed and a dressing was applied. The patient tolerated the procedure well without immediate post procedural complication. FINDINGS: A total of approximately 2.8 L of yellow fluid was removed. Samples were sent to the laboratory as requested by the clinical team. IMPRESSION: Successful ultrasound-guided paracentesis yielding 2.8 liters of peritoneal fluid. Read by: Soyla Dryer, NP Electronically Signed   By: Corrie Mckusick D.O.   On: 05/10/2021  16:35     EKG EKG Interpretation  Date/Time:  Friday May 13 2021 12:09:16 EDT Ventricular Rate:  86 PR Interval:  148 QRS Duration: 78 QT Interval:  372 QTC Calculation: 445 R Axis:   55 Text Interpretation: Normal sinus rhythm Nonspecific ST abnormality Confirmed by Lajean Saver [phone]) on 05/13/2021 1:56:19 PM  Radiology No results found.  Procedures Procedures   Medications Ordered in ED Medications - No data to display  ED Course  I have reviewed the triage vital signs and the nursing notes.  Pertinent labs & imaging results that were available during my care of the patient were reviewed by me and considered in my medical decision making (see chart for details).    MDM Rules/Calculators/A&P                          Labs sent. Continuous pulse ox and cardiac monitoring. Stat labs.   Reviewed nursing notes and prior charts for additional history.   Labs reviewed/interpreted by me - NA low. NS bolus. Ammonia is slightly high. Lactulose dose po. Compared to recent prior labs, Na is low, lfts chronically elevated but increased from  prior.   Given acute alteration mental status, low Na, worsening liver fxn, etc. - will consult hospitalists for admission.  Given [place] history, also possibly [place] issues also contributing to earlier behavioral symptoms. In ED, currently denies pain, no fever/chills, and is alert and cooperative/conversant without recurrence of earlier described behavioral issues.   Discussed pt with Hospitalist - will admit.        Final Clinical Impression(s) / ED Diagnoses Final diagnoses:  None    Rx / DC Orders ED Discharge Orders     None        Lajean Saver, MD 05/14/21 [phone]

## 2021-05-13 NOTE — Progress Notes (Signed)
RUQ U/S resulted, cholelithiasis but no signs of infection and CBD WNL.

## 2021-05-13 NOTE — H&P (Signed)
History and Physical    [name] [id number] DOB: 1965-11-18 DOA: 05/13/2021  PCP: Joel Nakai, MD (Confirm with patient/family/NH records and if not entered, this has to be entered at [facility] point of entry) Patient coming from: Home  I have personally briefly reviewed patient's old medical records in [place]  Chief Complaint: I felt maniac  HPI: [name] is a 55 y.o. male with medical history significant of cirrhosis secondary to alcohol abuse, varices, hepatocellular carcinoma status post TACE, seizure disorder, bipolar, presented with altered mentations.  Was recently just hospitalized and discharged 2 days ago for episodes of decompensated hepatic encephalopathy, during which hospitalization, cirrhosis medications was adjusted, patient was supposed to take lactulose 3 times a day to titrate BM 2-3 times a day and rifaximin was restarted.  Patient claims that he has been compliant with his lactulose and had 3 loose bowel movement yesterday.  Yesterday evening, patient started to feel " Crazy", could not sleep through the night.  This morning, patient continued to feel restless, started to have tremors on his arm, which he himself recognized as "my ammonia must be sky high again" and was seen wandering around in the neighborhood naked.  Patient denied any fever chills, no abd pain.  ED Course: Ammonia 36, sodium 124, slightly increased AST and ALT but significant increase of total bilirubin.  Review of Systems: As per HPI otherwise 14 point review of systems negative.    Past Medical History:  Diagnosis Date   Abuse, drug or alcohol ([place])    Anemia    Bipolar affective disorder ([place])    [facility])    liver cancer treated with micrablation   Chronic liver disease and cirrhosis    Depression    DJD (degenerative joint disease)    right knee   Dyslipidemia    Encephalopathy    Esophageal varices ([facility])    Hernia    History  of alcohol abuse    PONV (postoperative nausea and vomiting)    Seizures ([facility])    Dr Jannifer Franklin   Thrombocytopenia ([place])    Wilson disease    Wrist fracture    right    Past Surgical History:  Procedure Laterality Date   ESOPHAGOGASTRODUODENOSCOPY     HARDWARE REMOVAL Right 01/22/2015   Procedure: HARDWARE REMOVAL RIGHT LEG;  Surgeon: Joel Koyanagi, MD;  Location: [place];  Service: Orthopedics;  Laterality: Right;   IR PARACENTESIS  10/04/2018   IR PARACENTESIS  04/29/2019   IR PARACENTESIS  05/10/2021   left inguinal hernia  june 2012   [place]   liver cancer  02/2015   ablation   microablation of liver     OPEN REDUCTION INTERNAL FIXATION (ORIF) DISTAL RADIAL FRACTURE Right 12/21/2016   Procedure: OPEN REDUCTION INTERNAL FIXATION (ORIF) RIGHT DISTAL RADIUS FRACTURE;  Surgeon: Joel Koyanagi, MD;  Location: [place];  Service: Orthopedics;  Laterality: Right;   OPEN REDUCTION INTERNAL FIXATION (ORIF) TIBIA/FIBULA FRACTURE Right 07/25/2013   Procedure: OPEN REDUCTION INTERNAL FIXATION (ORIF) RIGHT TIBIAL PLATEAU, TIBIAL SHAFT AND FIBULA FRACTURES, POSSIBLE FASCIOTOMIES;  Surgeon: Marianna Payment, MD;  Location: [place];  Service: Orthopedics;  Laterality: Right;   ORIF WRIST FRACTURE Left 07/23/2019   Procedure: OPEN REDUCTION INTERNAL FIXATION (ORIF) WRIST FRACTURE;  Surgeon: Joel Koyanagi, MD;  Location: [place];  Service: Orthopedics;  Laterality: Left;   TOTAL HIP ARTHROPLASTY Left 07/23/2019   Procedure: TOTAL HIP ARTHROPLASTY  ANTERIOR APPROACH;  Surgeon: Joel Koyanagi, MD;  Location: [place];  Service: Orthopedics;  Laterality: Left;   TOTAL KNEE ARTHROPLASTY Right 07/15/2015   Procedure: RIGHT TOTAL KNEE ARTHROPLASTY;  Surgeon: Joel Koyanagi, MD;  Location: [place];  Service: Orthopedics;  Laterality: Right;   VASCULAR SURGERY       reports that he has been smoking cigarettes. He has been smoking an average of .5 packs per day. He has never used smokeless tobacco. He reports that he does  not drink alcohol and does not use drugs.  Allergies  Allergen Reactions   Codeine Nausea And Vomiting and Other (See Comments)    Reports it makes me crazy.     Family History  Problem Relation Age of Onset   Heart disease Other    Diabetes Father    Dementia Mother    Glaucoma Brother      Prior to Admission medications   Medication Sig Start Date End Date Taking? Authorizing Provider  furosemide (LASIX) 40 MG tablet Take 2 tablets (80 mg total) by mouth 2 (two) times daily. 04/29/19   Joel Borg, MD  lactulose (CHRONULAC) 10 GM/15ML solution Take 30 mLs (20 g total) by mouth 3 (three) times daily. 05/11/21   Joel Slocumb, DO  levETIRAcetam (KEPPRA) 500 MG tablet Take 3 tablets (1,500 mg total) by mouth 2 (two) times daily. 12/20/20   Joel Sprang, MD  oxyCODONE (OXY IR/ROXICODONE) 5 MG immediate release tablet Take 5 mg by mouth every 4 (four) hours as needed for pain. 05/03/21   [provider]  potassium chloride (KLOR-CON) 10 MEQ tablet TAKE ONE TABLET BY MOUTH TWICE DAILY Patient taking differently: Take 20 mEq by mouth 2 (two) times daily. 01/27/20   Joel Borg, MD  rOPINIRole (REQUIP) 1 MG tablet Take 1 mg by mouth at bedtime.    [provider]  spironolactone (ALDACTONE) 100 MG tablet Take 2 tablets (200 mg total) by mouth every morning. 04/29/19   Joel Borg, MD  zinc gluconate 50 MG tablet Take 50 mg by mouth 3 (three) times daily.     [provider]    Physical Exam: Vitals:   05/13/21 1207 05/13/21 1222 05/13/21 1345  BP: (!) 134/54  133/81  Pulse: 85  84  Resp: 18  16  Temp: 97.6 F (36.4 C)    TempSrc: Oral    SpO2: 100%  100%  Height:  '6\' 1"'$  (1.854 m)     Constitutional: NAD, calm, comfortable Vitals:   05/13/21 1207 05/13/21 1222 05/13/21 1345  BP: (!) 134/54  133/81  Pulse: 85  84  Resp: 18  16  Temp: 97.6 F (36.4 C)    TempSrc: Oral    SpO2: 100%  100%  Height:  '6\' 1"'$  (1.854 m)    Eyes: PERRL, lids and  conjunctivae jaundice ENMT: Mucous membranes are moist. Posterior pharynx clear of any exudate or lesions.Normal dentition.  Neck: normal, supple, no masses, no thyromegaly Respiratory: clear to auscultation bilaterally, no wheezing, no crackles. Normal respiratory effort. No accessory muscle use.  Cardiovascular: Regular rate and rhythm, no murmurs / rubs / gallops. 2+ extremity edema. 2+ pedal pulses. No carotid bruits.  Abdomen: no tenderness, no masses palpated.  Abdomen distended, positive ascites sign.  No hepatosplenomegaly. Bowel sounds positive.  Musculoskeletal: no clubbing / cyanosis. No joint deformity upper and lower extremities. Good ROM, no contractures. Normal muscle tone.  Skin: no rashes, lesions, ulcers. No induration  Neurologic: CN 2-12 grossly intact. Sensation intact, DTR normal. Strength 5/5 in all 4.  Coarse tremors on bilateral fingertips and arms Psychiatric: Normal judgment and insight. Alert and oriented x 3. Normal mood.   (  Labs on Admission: I have personally reviewed following labs and imaging studies  CBC: Recent Labs  Lab 05/09/21 1252 05/10/21 0327 05/11/21 0308 05/13/21 1235  WBC 10.5 7.9 7.4 10.7*  NEUTROABS 8.4*  --  4.7 8.0*  HGB 12.4* 11.9* 11.8* 12.6*  HCT 38.6* 35.2* 34.0*  31.4* 36.6*  MCV 107.5* 103.5* 100.9* 102.8*  PLT PLATELET CLUMPS NOTED ON SMEAR, UNABLE TO ESTIMATE 69* 68* [id number]*   Basic Metabolic Panel: Recent Labs  Lab 05/09/21 1252 05/10/21 0327 05/11/21 0308 05/11/21 1504 05/13/21 1235  NA 130* 132* 130*  --  124*  K 3.8 3.3* 2.9* 3.4* 3.7  CL 95* 102 98  --  89*  CO2 '23 25 26  '$ --  24  GLUCOSE 84 118* 117*  --  79  BUN '13 10 8  '$ --  16  CREATININE 0.86 0.84 0.82  --  0.72  CALCIUM 8.2* 8.3* 8.0*  --  8.7*   GFR: Estimated Creatinine Clearance: 115.1 mL/min (by C-G formula based on SCr of 0.72 mg/dL). Liver Function Tests: Recent Labs  Lab 05/09/21 1252 05/10/21 0327 05/11/21 0308 05/13/21 1235  AST 134* 100*  74* 130*  ALT 141* 116* 89* 108*  ALKPHOS 217* 200* 172* 217*  BILITOT 8.7* 8.2* 6.1* 13.2*  PROT 5.1* 4.8* 4.2* 5.8*  ALBUMIN 2.2* 2.0* 1.7* 2.5*   Recent Labs  Lab 05/13/21 1235  LIPASE 61*   Recent Labs  Lab 05/09/21 1252 05/13/21 1236  AMMONIA 64* 36*   Coagulation Profile: Recent Labs  Lab 05/09/21 1252 05/10/21 0327 05/13/21 1235  INR 1.7* 1.7* 1.7*   Cardiac Enzymes: No results for input(s): CKTOTAL, CKMB, CKMBINDEX, TROPONINI in the last 168 hours. BNP (last 3 results) No results for input(s): PROBNP in the last 8760 hours. HbA1C: No results for input(s): HGBA1C in the last 72 hours. CBG: No results for input(s): GLUCAP in the last 168 hours. Lipid Profile: No results for input(s): CHOL, HDL, LDLCALC, TRIG, CHOLHDL, LDLDIRECT in the last 72 hours. Thyroid Function Tests: No results for input(s): TSH, T4TOTAL, FREET4, T3FREE, THYROIDAB in the last 72 hours. Anemia Panel: Recent Labs    05/11/21 0308  VITAMINB12 2,463*   Urine analysis:    Component Value Date/Time   COLORURINE AMBER (A) 05/13/2021 1235   APPEARANCEUR CLEAR 05/13/2021 1235   LABSPEC 1.010 05/13/2021 1235   PHURINE 6.5 05/13/2021 1235   GLUCOSEU NEGATIVE 05/13/2021 1235   GLUCOSEU NEGATIVE 06/11/2019 1128   HGBUR TRACE (A) 05/13/2021 1235   BILIRUBINUR SMALL (A) 05/13/2021 1235   KETONESUR NEGATIVE 05/13/2021 1235   PROTEINUR NEGATIVE 05/13/2021 1235   UROBILINOGEN 2.0 (A) 06/11/2019 1128   NITRITE NEGATIVE 05/13/2021 [name] 05/13/2021 1235    Radiological Exams on Admission: No results found.  EKG: Independently reviewed. Sinus, no acute ST-T changes.  Assessment/Plan Active Problems:   Hepatic encephalopathy ([place])  (please populate well all problems here in Problem List. (For example, if patient is on BP meds at home and you resume or decide to hold them, it is a problem that needs to be her. Same for CAD, COPD, HLD and so on)  Acute on chronic  metabolic encephalopathy -Probably multifactorial from a combination effect of decompensated hepatic encephalopathy as well as worsening of hyponatremia. -  For hepatic encephalopathy, continue lactulose 3 times a day, discontinue narcotics, SBP was ruled out 2 days ago with paracentesis. -Continue rifaximin twice daily -For worsening of hyponatremia, likely also from decompensated cirrhosis and fluid accumulation, switch p.o. Lasix to IV Lasix 40 mg twice daily, continue Aldactone. -Melatonin for sleep  Hyperbilirubinemia -Likely secondary to decompensated cirrhosis, hepatocellular cancer and recent ablation procedure. -Order repeat RUQ ultrasound -Trial of Cholestyramine  Coagulopathy -Elevated INR= 1.7, 1 dose of vitamin K p.o. daily.  Recheck INR in the morning.  Avoid chemical DVT prophylaxis.  Hepatocellular CA -Status post ablation, outpatient follow-up with [facility] liver [facility] center.  Seizure disorder -Stable, continue Keppra.  DVT prophylaxis: SCD Code Status: Full code Family Communication: None at bedside Disposition Plan: Expect more than 2 midnight hospital stay, to treat and monitor his encephalopathy Consults called: None Admission status: MedSurg admit   Lequita Halt MD [facility] Pager [phone]  05/13/2021, 3:42 PM

## 2021-05-13 NOTE — ED Notes (Signed)
Per previous RN, 3pm meds not given by day shift.

## 2021-05-13 NOTE — ED Provider Notes (Signed)
Emergency Medicine Provider Triage Evaluation Note  [name] , a 55 y.o. male  was evaluated in triage.  Pt complains of "my ammonia is high because I was doing crazy things." He was reportedly, per triage note, running around naked outside and when EMS got there he was clothed. When I first walked into triage earlier he was wandering around in the middle area outside of any room aimlessly asking me if we had any food that he could eat.  He reports compliance with his medications.  Review of Systems  Positive: Bizarre behavior Negative: fever  Physical Exam  BP (!) 134/54 (BP Location: Left Arm)   Pulse 85   Temp 97.6 F (36.4 C) (Oral)   Resp 18   Ht '6\' 1"'$  (1.854 m)   SpO2 100%   BMI 27.71 kg/m  Gen:   Awake, no distress   Resp:  Normal effort  MSK:   Moves extremities without difficulty  Other:  Bizarre behavior  Medical Decision Making  Medically screening exam initiated at 12:31 PM.  Appropriate orders placed.  Marcio Furtado [place] was informed that the remainder of the evaluation will be completed by another provider, this initial triage assessment does not replace that evaluation, and the importance of remaining in the ED until their evaluation is complete.  Note: Portions of this report may have been transcribed using voice recognition software. Every effort was made to ensure accuracy; however, inadvertent computerized transcription errors may be present    Ollen Gross 05/13/21 1236    Lajean Saver, MD 05/13/21 [phone]

## 2021-05-14 ENCOUNTER — Encounter (HOSPITAL_COMMUNITY): Payer: Self-pay | Admitting: *Deleted

## 2021-05-14 ENCOUNTER — Encounter (HOSPITAL_COMMUNITY): Payer: Self-pay | Admitting: Internal Medicine

## 2021-05-14 DIAGNOSIS — K7460 Unspecified cirrhosis of liver: Secondary | ICD-10-CM

## 2021-05-14 DIAGNOSIS — K7031 Alcoholic cirrhosis of liver with ascites: Secondary | ICD-10-CM

## 2021-05-14 DIAGNOSIS — K7290 Hepatic failure, unspecified without coma: Secondary | ICD-10-CM

## 2021-05-14 DIAGNOSIS — K769 Liver disease, unspecified: Secondary | ICD-10-CM

## 2021-05-14 LAB — PROTIME-INR
INR: 1.8 — ABNORMAL HIGH (ref 0.8–1.2)
Prothrombin Time: 21 seconds — ABNORMAL HIGH (ref 11.4–15.2)

## 2021-05-14 LAB — COMPREHENSIVE METABOLIC PANEL
ALT: 93 U/L — ABNORMAL HIGH (ref 0–44)
AST: 108 U/L — ABNORMAL HIGH (ref 15–41)
Albumin: 2 g/dL — ABNORMAL LOW (ref 3.5–5.0)
Alkaline Phosphatase: 175 U/L — ABNORMAL HIGH (ref 38–126)
Anion gap: 7 (ref 5–15)
BUN: 15 mg/dL (ref 6–20)
CO2: 24 mmol/L (ref 22–32)
Calcium: 8.4 mg/dL — ABNORMAL LOW (ref 8.9–10.3)
Chloride: 96 mmol/L — ABNORMAL LOW (ref 98–111)
Creatinine, Ser: 0.84 mg/dL (ref 0.61–1.24)
GFR, Estimated: >60 mL/min (ref >60)
Glucose, Bld: 94 mg/dL (ref 70–99)
Potassium: 3.5 mmol/L (ref 3.5–5.1)
Sodium: 127 mmol/L — ABNORMAL LOW (ref 135–145)
Total Bilirubin: 10.1 mg/dL — ABNORMAL HIGH (ref 0.3–1.2)
Total Protein: 4.9 g/dL — ABNORMAL LOW (ref 6.5–8.1)

## 2021-05-14 LAB — AMMONIA: Ammonia: 54 umol/L — ABNORMAL HIGH (ref 9–35)

## 2021-05-14 LAB — CBC
HCT: 31.9 % — ABNORMAL LOW (ref 39.0–52.0)
Hemoglobin: 10.8 g/dL — ABNORMAL LOW (ref 13.0–17.0)
MCH: 34.2 pg — ABNORMAL HIGH (ref 26.0–34.0)
MCHC: 33.9 g/dL (ref 30.0–36.0)
MCV: 100.9 fL — ABNORMAL HIGH (ref 80.0–100.0)
Platelets: 72 10*3/uL — ABNORMAL LOW (ref 150–400)
RBC: 3.16 MIL/uL — ABNORMAL LOW (ref 4.22–5.81)
RDW: 16 % — ABNORMAL HIGH (ref 11.5–15.5)
WBC: 8 10*3/uL (ref 4.0–10.5)
nRBC: 0 % (ref 0.0–0.2)

## 2021-05-14 MED ORDER — OXYCODONE HCL 5 MG PO TABS
5.0000 mg | ORAL_TABLET | Freq: Four times a day (QID) | ORAL | Status: DC | PRN
  Administered 2021-05-14 – 2021-05-17 (×4): 5 mg via ORAL
  Filled 2021-05-14 (×4): qty 1

## 2021-05-14 NOTE — Progress Notes (Signed)
Progress Note    [name]  [id number] DOB: [birth year]  DOA: 05/13/2021 PCP: Cher Nakai, MD      Brief Narrative:    Medical records reviewed and are as summarized below:  [name] is a 55 y.o. male with medical history significant of cirrhosis secondary to alcohol abuse, recent discharge from the hospital on 05/11/2021 for hepatic encephalopathy, esophageal varices, hepatocellular carcinoma status post TACE, seizure disorder, bipolar disorder, who presented to the hospital with altered mental status.      Assessment/Plan:   Principal Problem:   Hepatic encephalopathy (HCC) Active Problems:   Chronic liver disease and cirrhosis (HCC)   Alcoholic cirrhosis of liver with ascites (HCC)   Body mass index is 27.71 kg/m.   Acute hepatic encephalopathy: Continue lactulose and rifaximin.  Monitor ammonia level.  Acute on chronic hyponatremia: Fluctuating sodium levels.  Monitor sodium level.  Decompensated liver cirrhosis, peripheral edema, hyperbilirubinemia, coagulopathy, hepatocellular carcinoma, chronic thrombocytopenia: S/p ablation.  Continue IV Lasix and Aldactone Outpatient follow-up with [facility] liver [facility].  Seizure disorder: Continue Keppra  Diet Order             Diet regular Room service appropriate? Yes; Fluid consistency: Thin; Fluid restriction: 1800 mL Fluid  Diet effective now                      Consultants: None  Procedures: None    Medications:    baclofen  10 mg Oral QHS   cholestyramine  4 g Oral BID   furosemide  40 mg Intravenous Q12H   lactulose  20 g Oral TID   levETIRAcetam  1,500 mg Oral BID   melatonin  3 mg Oral QHS   neomycin-bacitracin-polymyxin  1 application Topical Daily   phytonadione  5 mg Oral Once   potassium chloride  10 mEq Oral BID   rifaximin  550 mg Oral BID   spironolactone  200 mg Oral q morning   Continuous Infusions:   Anti-infectives (From admission, onward)     Start     Dose/Rate Route Frequency Ordered Stop   05/13/21 1545  rifaximin (XIFAXAN) tablet 550 mg        550 mg Oral 2 times daily 05/13/21 1541                Family Communication/Anticipated D/C date and plan/Code Status   DVT prophylaxis: Foot Pump / plexipulse Start: 05/13/21 1538     Code Status: Full Code  Family Communication: none Disposition Plan:    Status is: Inpatient  Remains inpatient appropriate because:Inpatient level of care appropriate due to severity of illness  Dispo: The patient is from: Home              Anticipated d/c is to: Home              Patient currently is not medically stable to d/c.   Difficult to place patient No           Subjective:   Interval events noted.  He complains of leg swelling.  He thinks confusion is a little better today.  Objective:    Vitals:   05/13/21 2230 05/14/21 0000 05/14/21 0045 05/14/21 0150  BP: 104/82 (!) 133/59 (!) 114/55 (!) 122/50  Pulse: 88 80 79 79  Resp: (!) '26 16 14 15  '$ Temp:    98.2 F (36.8 C)  TempSrc:    Oral  SpO2: 94% 96% 96%  100%  Height:       No data found.   Intake/Output Summary (Last 24 hours) at 05/14/2021 1246 Last data filed at 05/14/2021 0540 Gross per 24 hour  Intake --  Output 4200 ml  Net -4200 ml   There were no vitals filed for this visit.  Exam:  GEN: NAD SKIN: Jaundice, no rash. EYES: EOMI, icteric ENT: MMM CV: RRR PULM: CTA B ABD: soft, ND, NT, +BS CNS: AAO x 3, non focal, flapping tremors of bilateral hands EXT: B/l leg edema (L>R), no erythema or tenderness        Data Reviewed:   I have personally reviewed following labs and imaging studies:  Labs: Labs show the following:   Basic Metabolic Panel: Recent Labs  Lab 05/09/21 1252 05/10/21 0327 05/11/21 0308 05/11/21 1504 05/13/21 1235 05/13/21 2050 05/14/21 0207  NA 130* 132* 130*  --  124* 128* 127*  K 3.8 3.3* 2.9*   < > 3.7  --  3.5  CL 95* 102 98  --  89*  --   96*  CO2 '23 25 26  '$ --  24  --  24  GLUCOSE 84 118* 117*  --  79  --  94  BUN '13 10 8  '$ --  16  --  15  CREATININE 0.86 0.84 0.82  --  0.72  --  0.84  CALCIUM 8.2* 8.3* 8.0*  --  8.7*  --  8.4*   < > = values in this interval not displayed.   GFR Estimated Creatinine Clearance: 109.7 mL/min (by C-G formula based on SCr of 0.84 mg/dL). Liver Function Tests: Recent Labs  Lab 05/09/21 1252 05/10/21 0327 05/11/21 0308 05/13/21 1235 05/14/21 0207  AST 134* 100* 74* 130* 108*  ALT 141* 116* 89* 108* 93*  ALKPHOS 217* 200* 172* 217* 175*  BILITOT 8.7* 8.2* 6.1* 13.2* 10.1*  PROT 5.1* 4.8* 4.2* 5.8* 4.9*  ALBUMIN 2.2* 2.0* 1.7* 2.5* 2.0*   Recent Labs  Lab 05/13/21 1235  LIPASE 61*   Recent Labs  Lab 05/09/21 1252 05/13/21 1236 05/14/21 0207  AMMONIA 64* 36* 54*   Coagulation profile Recent Labs  Lab 05/09/21 1252 05/10/21 0327 05/13/21 1235 05/14/21 0207  INR 1.7* 1.7* 1.7* 1.8*    CBC: Recent Labs  Lab 05/09/21 1252 05/10/21 0327 05/11/21 0308 05/13/21 1235 05/14/21 0207  WBC 10.5 7.9 7.4 10.7* 8.0  NEUTROABS 8.4*  --  4.7 8.0*  --   HGB 12.4* 11.9* 11.8* 12.6* 10.8*  HCT 38.6* 35.2* 34.0*  31.4* 36.6* 31.9*  MCV 107.5* 103.5* 100.9* 102.8* 100.9*  PLT PLATELET CLUMPS NOTED ON SMEAR, UNABLE TO ESTIMATE 69* 68* 107* 72*   Cardiac Enzymes: No results for input(s): CKTOTAL, CKMB, CKMBINDEX, TROPONINI in the last 168 hours. BNP (last 3 results) No results for input(s): PROBNP in the last 8760 hours. CBG: No results for input(s): GLUCAP in the last 168 hours. D-Dimer: No results for input(s): DDIMER in the last 72 hours. Hgb A1c: No results for input(s): HGBA1C in the last 72 hours. Lipid Profile: No results for input(s): CHOL, HDL, LDLCALC, TRIG, CHOLHDL, LDLDIRECT in the last 72 hours. Thyroid function studies: No results for input(s): TSH, T4TOTAL, T3FREE, THYROIDAB in the last 72 hours.  Invalid input(s): FREET3 Anemia work up: No results for  input(s): VITAMINB12, FOLATE, FERRITIN, TIBC, IRON, RETICCTPCT in the last 72 hours. Sepsis Labs: Recent Labs  Lab 05/10/21 0327 05/11/21 0308 05/13/21 1235 05/14/21 0207  WBC 7.9 7.4 10.7*  8.0    Microbiology Recent Results (from the past 240 hour(s))  SARS CORONAVIRUS 2 (TAT 6-24 HRS) Nasopharyngeal Nasopharyngeal Swab     Status: None   Collection Time: 05/09/21  5:29 PM   Specimen: Nasopharyngeal Swab  Result Value Ref Range Status   SARS Coronavirus 2 NEGATIVE NEGATIVE Final    Comment: (NOTE) SARS-CoV-2 target nucleic acids are NOT DETECTED.  The SARS-CoV-2 RNA is generally detectable in upper and lower respiratory specimens during the acute phase of infection. Negative results do not preclude SARS-CoV-2 infection, do not rule out co-infections with other pathogens, and should not be used as the sole basis for treatment or other patient management decisions. Negative results must be combined with clinical observations, patient history, and epidemiological information. The expected result is Negative.  Fact Sheet for Patients: SugarRoll.be  Fact Sheet for Healthcare Providers: https://www.woods-mathews.com/  This test is not yet approved or cleared by the [place] FDA and  has been authorized for detection and/or diagnosis of SARS-CoV-2 by FDA under an Emergency Use Authorization (EUA). This EUA will remain  in effect (meaning this test can be used) for the duration of the COVID-19 declaration under Se ction 564(b)(1) of the Act, 21 U.S.C. section 360bbb-3(b)(1), unless the authorization is terminated or revoked sooner.  Performed at [facility], [place] [address] [place], [place] [address]   Resp Panel by RT-PCR (Flu A&B, Covid) Nasopharyngeal Swab     Status: None   Collection Time: 05/13/21 10:32 PM   Specimen: Nasopharyngeal Swab; Nasopharyngeal(NP) swabs in vial transport medium  Result Value Ref Range  Status   SARS Coronavirus 2 by RT PCR NEGATIVE NEGATIVE Final    Comment: (NOTE) SARS-CoV-2 target nucleic acids are NOT DETECTED.  The SARS-CoV-2 RNA is generally detectable in upper respiratory specimens during the acute phase of infection. The lowest concentration of SARS-CoV-2 viral copies this assay can detect is 138 copies/mL. A negative result does not preclude SARS-Cov-2 infection and should not be used as the sole basis for treatment or other patient management decisions. A negative result may occur with  improper specimen collection/handling, submission of specimen other than nasopharyngeal swab, presence of viral mutation(s) within the areas targeted by this assay, and inadequate number of viral copies(<138 copies/mL). A negative result must be combined with clinical observations, patient history, and epidemiological information. The expected result is Negative.  Fact Sheet for Patients:  EntrepreneurPulse.com.au  Fact Sheet for Healthcare Providers:  IncredibleEmployment.be  This test is no t yet approved or cleared by the [place] FDA and  has been authorized for detection and/or diagnosis of SARS-CoV-2 by FDA under an Emergency Use Authorization (EUA). This EUA will remain  in effect (meaning this test can be used) for the duration of the COVID-19 declaration under Section 564(b)(1) of the Act, 21 U.S.C.section 360bbb-3(b)(1), unless the authorization is terminated  or revoked sooner.       Influenza A by PCR NEGATIVE NEGATIVE Final   Influenza B by PCR NEGATIVE NEGATIVE Final    Comment: (NOTE) The Xpert Xpress SARS-CoV-2/FLU/RSV plus assay is intended as an aid in the diagnosis of influenza from Nasopharyngeal swab specimens and should not be used as a sole basis for treatment. Nasal washings and aspirates are unacceptable for Xpert Xpress SARS-CoV-2/FLU/RSV testing.  Fact Sheet for  Patients: EntrepreneurPulse.com.au  Fact Sheet for Healthcare Providers: IncredibleEmployment.be  This test is not yet approved or cleared by the [place] FDA and has been authorized for detection and/or diagnosis  of SARS-CoV-2 by FDA under an Emergency Use Authorization (EUA). This EUA will remain in effect (meaning this test can be used) for the duration of the COVID-19 declaration under Section 564(b)(1) of the Act, 21 U.S.C. section 360bbb-3(b)(1), unless the authorization is terminated or revoked.  Performed at [facility], [place] [address], [place], [place] [address]     Procedures and diagnostic studies:  US Abdomen Limited RUQ (LIVER/GB)  Result Date: 05/13/2021 CLINICAL DATA:  Elevated LFTs. EXAM: ULTRASOUND ABDOMEN LIMITED RIGHT UPPER QUADRANT COMPARISON:  CT abdomen and pelvis 05/09/2021. FINDINGS: Gallbladder: Multiple gallstones are present measuring up to 1.8 cm. There is gallbladder wall thickening measuring 8.6 mm. Sonographic Percell Miller sign is negative per sonographer. There is trace pericholecystic fluid. Common bile duct: Diameter: 4.2 mm. Liver: There is a nodular liver contour. 2 masses are identified in the right lobe of the liver as seen on recent CT. The more anterior mass measures 4.0 x 3.5 x 3.5 cm. The more posterior mass measures 5.8 x 4.3 x 4.7 cm. Portal vein is patent on color Doppler imaging with normal direction of blood flow towards the liver. Other: There is moderate ascites in the upper abdomen. There is some soft tissue thickening and nodularity within the ascites anterior to the liver. IMPRESSION: 1. Cholelithiasis with gallbladder wall thickening and pericholecystic fluid. Sonographic Percell Miller sign is negative and there is no biliary ductal dilatation. Acute or chronic cholecystitis cannot be excluded. 2. There are 2 liver masses, grossly unchanged. 3. Moderate ascites. There is soft tissue nodularity within the  ascites which may be related to peritoneal carcinomatosis. Electronically Signed   By: Ronney Asters M.D.   On: 05/13/2021 17:33               LOS: 1 day   Deissy Guilbert  [facility]   Pager on [email or web address]. If 7PM-7AM, please contact night-coverage at [email or web address]     05/14/2021, 12:46 PM

## 2021-05-14 NOTE — ED Notes (Signed)
Report attempted x1, asked to call back

## 2021-05-15 LAB — BASIC METABOLIC PANEL
Anion gap: 7 (ref 5–15)
BUN: 12 mg/dL (ref 6–20)
CO2: 23 mmol/L (ref 22–32)
Calcium: 8.5 mg/dL — ABNORMAL LOW (ref 8.9–10.3)
Chloride: 98 mmol/L (ref 98–111)
Creatinine, Ser: 0.71 mg/dL (ref 0.61–1.24)
GFR, Estimated: >60 mL/min (ref >60)
Glucose, Bld: 104 mg/dL — ABNORMAL HIGH (ref 70–99)
Potassium: 3.6 mmol/L (ref 3.5–5.1)
Sodium: 128 mmol/L — ABNORMAL LOW (ref 135–145)

## 2021-05-15 LAB — CBC WITH DIFFERENTIAL/PLATELET
Abs Immature Granulocytes: 0.12 10*3/uL — ABNORMAL HIGH (ref 0.00–0.07)
Basophils Absolute: 0.1 10*3/uL (ref 0.0–0.1)
Basophils Relative: 1 %
Eosinophils Absolute: 0.2 10*3/uL (ref 0.0–0.5)
Eosinophils Relative: 2 %
HCT: 32.9 % — ABNORMAL LOW (ref 39.0–52.0)
Hemoglobin: 11 g/dL — ABNORMAL LOW (ref 13.0–17.0)
Immature Granulocytes: 1 %
Lymphocytes Relative: 10 %
Lymphs Abs: 1 10*3/uL (ref 0.7–4.0)
MCH: 34.3 pg — ABNORMAL HIGH (ref 26.0–34.0)
MCHC: 33.4 g/dL (ref 30.0–36.0)
MCV: 102.5 fL — ABNORMAL HIGH (ref 80.0–100.0)
Monocytes Absolute: 1.4 10*3/uL — ABNORMAL HIGH (ref 0.1–1.0)
Monocytes Relative: 16 %
Neutro Abs: 6.4 10*3/uL (ref 1.7–7.7)
Neutrophils Relative %: 70 %
Platelets: 70 10*3/uL — ABNORMAL LOW (ref 150–400)
RBC: 3.21 MIL/uL — ABNORMAL LOW (ref 4.22–5.81)
RDW: 16.5 % — ABNORMAL HIGH (ref 11.5–15.5)
WBC: 9.1 10*3/uL (ref 4.0–10.5)
nRBC: 0 % (ref 0.0–0.2)

## 2021-05-15 LAB — AMMONIA: Ammonia: 90 umol/L — ABNORMAL HIGH (ref 9–35)

## 2021-05-15 MED ORDER — BISACODYL 10 MG RE SUPP
10.0000 mg | Freq: Once | RECTAL | Status: DC
  Filled 2021-05-15: qty 1

## 2021-05-15 MED ORDER — SENNA 8.6 MG PO TABS
1.0000 | ORAL_TABLET | Freq: Once | ORAL | Status: AC
  Administered 2021-05-15: 8.6 mg via ORAL
  Filled 2021-05-15: qty 1

## 2021-05-15 MED ORDER — LACTULOSE 10 GM/15ML PO SOLN
30.0000 g | Freq: Three times a day (TID) | ORAL | Status: DC
Start: 2021-05-15 — End: 2021-05-17
  Administered 2021-05-15 – 2021-05-16 (×4): 30 g via ORAL
  Filled 2021-05-15 (×5): qty 45

## 2021-05-15 NOTE — Plan of Care (Signed)
  Problem: Health Behavior/Discharge Planning: Goal: Ability to manage health-related needs will improve Outcome: Progressing   Problem: Safety: Goal: Ability to remain free from injury will improve Outcome: Progressing   Problem: Skin Integrity: Goal: Risk for impaired skin integrity will decrease Outcome: Progressing   

## 2021-05-15 NOTE — Plan of Care (Signed)
  Problem: Health Behavior/Discharge Planning: Goal: Ability to manage health-related needs will improve Outcome: Progressing   Problem: Pain Managment: Goal: General experience of comfort will improve Outcome: Progressing   Problem: Safety: Goal: Ability to remain free from injury will improve Outcome: Progressing   Problem: Skin Integrity: Goal: Risk for impaired skin integrity will decrease Outcome: Progressing

## 2021-05-15 NOTE — Care Management (Addendum)
DC within past week. Has RW, cane, 3/1 at home. Started w Alvis Lemmings for PT OT.

## 2021-05-15 NOTE — Progress Notes (Signed)
PROGRESS NOTE    [name]  [id number] DOB: Oct 28, 1965 DOA: 05/13/2021 PCP: Cher Nakai, MD    Brief Narrative:  55 y.o. male with medical history significant of cirrhosis secondary to alcohol abuse, recent discharge from the hospital on 05/11/2021 for hepatic encephalopathy, esophageal varices, hepatocellular carcinoma status post TACE, seizure disorder, bipolar disorder, who presented to the hospital with altered mental status  Assessment & Plan:   Principal Problem:   Hepatic encephalopathy ([place]) Active Problems:   Chronic liver disease and cirrhosis ([place])   Alcoholic cirrhosis of liver with ascites ([place])   Acute hepatic encephalopathy:  -Continue lactulose and rifaximin.   -Still no bowel movement yet -We will increase lactulose to 30 g and give trial of Dulcolax suppository -Goal is 2-3 bowel movements per day -Recheck ammonia level in the morning   Acute on chronic hyponatremia:  -Remains low but is improving to 128 today -Repeat basic metabolic panel in the morning   Decompensated liver cirrhosis, peripheral edema, hyperbilirubinemia, coagulopathy, hepatocellular carcinoma, chronic thrombocytopenia:  -Pt is s/p ablation.  Continue IV Lasix and Aldactone Recommend close f/u with [facility] liver transplant center.   Seizure disorder: Continue Keppra -Remained seizure-free thus far   DVT prophylaxis: footpump/plexipulse Code Status: Full Family Communication: Pt in room, family not at bedside  Status is: Inpatient  Remains inpatient appropriate because:Inpatient level of care appropriate due to severity of illness  Dispo: The patient is from: Home              Anticipated d/c is to: Home              Patient currently is not medically stable to d/c.   Difficult to place patient No       Consultants:    Procedures:    Antimicrobials: Anti-infectives (From admission, onward)    Start     Dose/Rate Route Frequency Ordered Stop   05/13/21 1545   rifaximin (XIFAXAN) tablet 550 mg        550 mg Oral 2 times daily 05/13/21 1541         Subjective: Still feeling slow today. Requested lactulose dose in be increased. Still no BM thus far  Objective: Vitals:   05/14/21 0045 05/14/21 0150 05/14/21 2248 05/15/21 1211  BP: (!) 114/55 (!) 122/50 129/66 (!) 108/56  Pulse: 79 79 81 71  Resp: '14 15 20 18  '$ Temp:  98.2 F (36.8 C) 97.6 F (36.4 C) 98 F (36.7 C)  TempSrc:  Oral Oral Oral  SpO2: 96% 100% 98% 96%  Height:        Intake/Output Summary (Last 24 hours) at 05/15/2021 1520 Last data filed at 05/15/2021 0616 Gross per 24 hour  Intake --  Output 3500 ml  Net -3500 ml   There were no vitals filed for this visit.  Examination: General exam: Awake, laying in bed, in nad Respiratory system: Normal respiratory effort, no wheezing Cardiovascular system: regular rate, s1, s2 Gastrointestinal system: Soft, nondistended, positive BS Central nervous system: CN2-12 grossly intact, strength intact Extremities: Perfused, no clubbing Skin: Normal skin turgor, no notable skin lesions seen Psychiatry: Mood normal // no visual hallucinations   Data Reviewed: I have personally reviewed following labs and imaging studies  CBC: Recent Labs  Lab 05/09/21 1252 05/10/21 0327 05/11/21 0308 05/13/21 1235 05/14/21 0207 05/15/21 0329  WBC 10.5 7.9 7.4 10.7* 8.0 9.1  NEUTROABS 8.4*  --  4.7 8.0*  --  6.4  HGB 12.4* 11.9* 11.8* 12.6* 10.8*  11.0*  HCT 38.6* 35.2* 34.0*  31.4* 36.6* 31.9* 32.9*  MCV 107.5* 103.5* 100.9* 102.8* 100.9* 102.5*  PLT PLATELET CLUMPS NOTED ON SMEAR, UNABLE TO ESTIMATE 69* 68* 107* 72* 70*   Basic Metabolic Panel: Recent Labs  Lab 05/10/21 0327 05/11/21 0308 05/11/21 1504 05/13/21 1235 05/13/21 2050 05/14/21 0207 05/15/21 0329  NA 132* 130*  --  124* 128* 127* 128*  K 3.3* 2.9* 3.4* 3.7  --  3.5 3.6  CL 102 98  --  89*  --  96* 98  CO2 25 26  --  24  --  24 23  GLUCOSE 118* 117*  --  79  --  94  104*  BUN 10 8  --  16  --  15 12  CREATININE 0.84 0.82  --  0.72  --  0.84 0.71  CALCIUM 8.3* 8.0*  --  8.7*  --  8.4* 8.5*   GFR: Estimated Creatinine Clearance: 115.1 mL/min (by C-G formula based on SCr of 0.71 mg/dL). Liver Function Tests: Recent Labs  Lab 05/09/21 1252 05/10/21 0327 05/11/21 0308 05/13/21 1235 05/14/21 0207  AST 134* 100* 74* 130* 108*  ALT 141* 116* 89* 108* 93*  ALKPHOS 217* 200* 172* 217* 175*  BILITOT 8.7* 8.2* 6.1* 13.2* 10.1*  PROT 5.1* 4.8* 4.2* 5.8* 4.9*  ALBUMIN 2.2* 2.0* 1.7* 2.5* 2.0*   Recent Labs  Lab 05/13/21 1235  LIPASE 61*   Recent Labs  Lab 05/09/21 1252 05/13/21 1236 05/14/21 0207 05/15/21 0329  AMMONIA 64* 36* 54* 90*   Coagulation Profile: Recent Labs  Lab 05/09/21 1252 05/10/21 0327 05/13/21 1235 05/14/21 0207  INR 1.7* 1.7* 1.7* 1.8*   Cardiac Enzymes: No results for input(s): CKTOTAL, CKMB, CKMBINDEX, TROPONINI in the last 168 hours. BNP (last 3 results) No results for input(s): PROBNP in the last 8760 hours. HbA1C: No results for input(s): HGBA1C in the last 72 hours. CBG: No results for input(s): GLUCAP in the last 168 hours. Lipid Profile: No results for input(s): CHOL, HDL, LDLCALC, TRIG, CHOLHDL, LDLDIRECT in the last 72 hours. Thyroid Function Tests: No results for input(s): TSH, T4TOTAL, FREET4, T3FREE, THYROIDAB in the last 72 hours. Anemia Panel: No results for input(s): VITAMINB12, FOLATE, FERRITIN, TIBC, IRON, RETICCTPCT in the last 72 hours. Sepsis Labs: No results for input(s): PROCALCITON, LATICACIDVEN in the last 168 hours.  Recent Results (from the past 240 hour(s))  SARS CORONAVIRUS 2 (TAT 6-24 HRS) Nasopharyngeal Nasopharyngeal Swab     Status: None   Collection Time: 05/09/21  5:29 PM   Specimen: Nasopharyngeal Swab  Result Value Ref Range Status   SARS Coronavirus 2 NEGATIVE NEGATIVE Final    Comment: (NOTE) SARS-CoV-2 target nucleic acids are NOT DETECTED.  The SARS-CoV-2 RNA is  generally detectable in upper and lower respiratory specimens during the acute phase of infection. Negative results do not preclude SARS-CoV-2 infection, do not rule out co-infections with other pathogens, and should not be used as the sole basis for treatment or other patient management decisions. Negative results must be combined with clinical observations, patient history, and epidemiological information. The expected result is Negative.  Fact Sheet for Patients: SugarRoll.be  Fact Sheet for Healthcare Providers: https://www.woods-mathews.com/  This test is not yet approved or cleared by the [place] FDA and  has been authorized for detection and/or diagnosis of SARS-CoV-2 by FDA under an Emergency Use Authorization (EUA). This EUA will remain  in effect (meaning this test can be used) for the duration of the  COVID-19 declaration under Se ction 564(b)(1) of the Act, 21 U.S.C. section 360bbb-3(b)(1), unless the authorization is terminated or revoked sooner.  Performed at [facility], [place] [address], [place], [place] [address]   Resp Panel by RT-PCR (Flu A&B, Covid) Nasopharyngeal Swab     Status: None   Collection Time: 05/13/21 10:32 PM   Specimen: Nasopharyngeal Swab; Nasopharyngeal(NP) swabs in vial transport medium  Result Value Ref Range Status   SARS Coronavirus 2 by RT PCR NEGATIVE NEGATIVE Final    Comment: (NOTE) SARS-CoV-2 target nucleic acids are NOT DETECTED.  The SARS-CoV-2 RNA is generally detectable in upper respiratory specimens during the acute phase of infection. The lowest concentration of SARS-CoV-2 viral copies this assay can detect is 138 copies/mL. A negative result does not preclude SARS-Cov-2 infection and should not be used as the sole basis for treatment or other patient management decisions. A negative result may occur with  improper specimen collection/handling, submission of specimen  other than nasopharyngeal swab, presence of viral mutation(s) within the areas targeted by this assay, and inadequate number of viral copies(<138 copies/mL). A negative result must be combined with clinical observations, patient history, and epidemiological information. The expected result is Negative.  Fact Sheet for Patients:  EntrepreneurPulse.com.au  Fact Sheet for Healthcare Providers:  IncredibleEmployment.be  This test is no t yet approved or cleared by the [place] FDA and  has been authorized for detection and/or diagnosis of SARS-CoV-2 by FDA under an Emergency Use Authorization (EUA). This EUA will remain  in effect (meaning this test can be used) for the duration of the COVID-19 declaration under Section 564(b)(1) of the Act, 21 U.S.C.section 360bbb-3(b)(1), unless the authorization is terminated  or revoked sooner.       Influenza A by PCR NEGATIVE NEGATIVE Final   Influenza B by PCR NEGATIVE NEGATIVE Final    Comment: (NOTE) The Xpert Xpress SARS-CoV-2/FLU/RSV plus assay is intended as an aid in the diagnosis of influenza from Nasopharyngeal swab specimens and should not be used as a sole basis for treatment. Nasal washings and aspirates are unacceptable for Xpert Xpress SARS-CoV-2/FLU/RSV testing.  Fact Sheet for Patients: EntrepreneurPulse.com.au  Fact Sheet for Healthcare Providers: IncredibleEmployment.be  This test is not yet approved or cleared by the [place] FDA and has been authorized for detection and/or diagnosis of SARS-CoV-2 by FDA under an Emergency Use Authorization (EUA). This EUA will remain in effect (meaning this test can be used) for the duration of the COVID-19 declaration under Section 564(b)(1) of the Act, 21 U.S.C. section 360bbb-3(b)(1), unless the authorization is terminated or revoked.  Performed at [facility], [place] [address] [place],  [place] [address]      Radiology Studies: US Abdomen Limited RUQ (LIVER/GB)  Result Date: 05/13/2021 CLINICAL DATA:  Elevated LFTs. EXAM: ULTRASOUND ABDOMEN LIMITED RIGHT UPPER QUADRANT COMPARISON:  CT abdomen and pelvis 05/09/2021. FINDINGS: Gallbladder: Multiple gallstones are present measuring up to 1.8 cm. There is gallbladder wall thickening measuring 8.6 mm. Sonographic Percell Miller sign is negative per sonographer. There is trace pericholecystic fluid. Common bile duct: Diameter: 4.2 mm. Liver: There is a nodular liver contour. 2 masses are identified in the right lobe of the liver as seen on recent CT. The more anterior mass measures 4.0 x 3.5 x 3.5 cm. The more posterior mass measures 5.8 x 4.3 x 4.7 cm. Portal vein is patent on color Doppler imaging with normal direction of blood flow towards the liver. Other: There is moderate ascites  in the upper abdomen. There is some soft tissue thickening and nodularity within the ascites anterior to the liver. IMPRESSION: 1. Cholelithiasis with gallbladder wall thickening and pericholecystic fluid. Sonographic Percell Miller sign is negative and there is no biliary ductal dilatation. Acute or chronic cholecystitis cannot be excluded. 2. There are 2 liver masses, grossly unchanged. 3. Moderate ascites. There is soft tissue nodularity within the ascites which may be related to peritoneal carcinomatosis. Electronically Signed   By: Ronney Asters M.D.   On: 05/13/2021 17:33    Scheduled Meds:  baclofen  10 mg Oral QHS   bisacodyl  10 mg Rectal Once   cholestyramine  4 g Oral BID   furosemide  40 mg Intravenous Q12H   lactulose  30 g Oral TID   levETIRAcetam  1,500 mg Oral BID   melatonin  3 mg Oral QHS   neomycin-bacitracin-polymyxin  1 application Topical Daily   phytonadione  5 mg Oral Once   potassium chloride  10 mEq Oral BID   rifaximin  550 mg Oral BID   spironolactone  200 mg Oral q morning   Continuous Infusions:   LOS: 2 days   Marylu Lund, MD [facility] Pager On Amion  If 7PM-7AM, please contact night-coverage 05/15/2021, 3:20 PM   \

## 2021-05-16 LAB — COMPREHENSIVE METABOLIC PANEL
ALT: 81 U/L — ABNORMAL HIGH (ref 0–44)
AST: 92 U/L — ABNORMAL HIGH (ref 15–41)
Albumin: 1.8 g/dL — ABNORMAL LOW (ref 3.5–5.0)
Alkaline Phosphatase: 198 U/L — ABNORMAL HIGH (ref 38–126)
Anion gap: 7 (ref 5–15)
BUN: 13 mg/dL (ref 6–20)
CO2: 24 mmol/L (ref 22–32)
Calcium: 8.4 mg/dL — ABNORMAL LOW (ref 8.9–10.3)
Chloride: 95 mmol/L — ABNORMAL LOW (ref 98–111)
Creatinine, Ser: 0.71 mg/dL (ref 0.61–1.24)
GFR, Estimated: >60 mL/min (ref >60)
Glucose, Bld: 123 mg/dL — ABNORMAL HIGH (ref 70–99)
Potassium: 3.4 mmol/L — ABNORMAL LOW (ref 3.5–5.1)
Sodium: 126 mmol/L — ABNORMAL LOW (ref 135–145)
Total Bilirubin: 7.2 mg/dL — ABNORMAL HIGH (ref 0.3–1.2)
Total Protein: 4.9 g/dL — ABNORMAL LOW (ref 6.5–8.1)

## 2021-05-16 LAB — AMMONIA: Ammonia: 83 umol/L — ABNORMAL HIGH (ref 9–35)

## 2021-05-16 LAB — MAGNESIUM: Magnesium: 1.6 mg/dL — ABNORMAL LOW (ref 1.7–2.4)

## 2021-05-16 MED ORDER — POTASSIUM CHLORIDE CRYS ER 20 MEQ PO TBCR
60.0000 meq | EXTENDED_RELEASE_TABLET | Freq: Once | ORAL | Status: AC
  Administered 2021-05-16: 60 meq via ORAL
  Filled 2021-05-16: qty 3

## 2021-05-16 MED ORDER — POTASSIUM CHLORIDE CRYS ER 10 MEQ PO TBCR
10.0000 meq | EXTENDED_RELEASE_TABLET | Freq: Two times a day (BID) | ORAL | Status: DC
  Administered 2021-05-17: 10 meq via ORAL
  Filled 2021-05-16: qty 1

## 2021-05-16 NOTE — Progress Notes (Signed)
PROGRESS NOTE    [name]  [id number] DOB: Jul 23, 1966 DOA: 05/13/2021 PCP: Cher Nakai, MD    Brief Narrative:  55 y.o. male with medical history significant of cirrhosis secondary to alcohol abuse, recent discharge from the hospital on 05/11/2021 for hepatic encephalopathy, esophageal varices, hepatocellular carcinoma status post TACE, seizure disorder, bipolar disorder, who presented to the hospital with altered mental status  Assessment & Plan:   Principal Problem:   Hepatic encephalopathy ([place]) Active Problems:   Chronic liver disease and cirrhosis ([place])   Alcoholic cirrhosis of liver with ascites ([place])   Acute hepatic encephalopathy:  -Continue lactulose and rifaximin.   -Still no bowel movement yet -Continue lactulose '30mg'$  tid, BM this AM -Goal is 2-3 bowel movements per day -discussed with GI who agrees with current plan -Ammonia of 83 today -On further questioning, highly suspect medication noncompliance. Pt states he is able to afford rifaximin but elected not to take. Also, while in hospital, patient had 1 BM this AM and reported it as 3 BM in one sitting. Pt educated   Acute on chronic hyponatremia:  -noted to be 126 today -Repeat basic metabolic panel in the morning   Decompensated liver cirrhosis, peripheral edema, hyperbilirubinemia, coagulopathy, hepatocellular carcinoma, chronic thrombocytopenia:  -Pt is s/p ablation.  Continue IV Lasix and Aldactone Recommend close f/u with [facility] liver transplant center.   Seizure disorder: Continue Keppra -Remained seizure-free thus far   DVT prophylaxis: footpump/plexipulse Code Status: Full Family Communication: Pt in room, family not at bedside  Status is: Inpatient  Remains inpatient appropriate because:Inpatient level of care appropriate due to severity of illness  Dispo: The patient is from: Home              Anticipated d/c is to: Home              Patient currently is not medically stable to d/c.    Difficult to place patient No   Consultants:    Procedures:    Antimicrobials: Anti-infectives (From admission, onward)    Start     Dose/Rate Route Frequency Ordered Stop   05/13/21 1545  rifaximin (XIFAXAN) tablet 550 mg        550 mg Oral 2 times daily 05/13/21 1541         Subjective: Reports feeling more awake  Objective: Vitals:   05/14/21 2248 05/15/21 1211 05/15/21 2142 05/16/21 0728  BP: 129/66 (!) 108/56 118/66 133/60  Pulse: 81 71 83 72  Resp: '20 18 19 19  '$ Temp: 97.6 F (36.4 C) 98 F (36.7 C) 98.4 F (36.9 C) 98 F (36.7 C)  TempSrc: Oral Oral Oral Oral  SpO2: 98% 96% 98% 100%  Height:        Intake/Output Summary (Last 24 hours) at 05/16/2021 1510 Last data filed at 05/16/2021 1000 Gross per 24 hour  Intake --  Output 2200 ml  Net -2200 ml    There were no vitals filed for this visit.  Examination: General exam: Conversant, in no acute distress Respiratory system: normal chest rise, clear, no audible wheezing Cardiovascular system: regular rhythm, s1-s2 Gastrointestinal system: Nondistended, nontender, pos BS Central nervous system: No seizures, no tremors Extremities: No cyanosis, no joint deformities Skin: No rashes, no pallor, asterixis  Psychiatry: Affect normal // no auditory hallucinations   Data Reviewed: I have personally reviewed following labs and imaging studies  CBC: Recent Labs  Lab 05/10/21 0327 05/11/21 0308 05/13/21 1235 05/14/21 0207 05/15/21 0329  WBC 7.9 7.4 10.7*  8.0 9.1  NEUTROABS  --  4.7 8.0*  --  6.4  HGB 11.9* 11.8* 12.6* 10.8* 11.0*  HCT 35.2* 34.0*  31.4* 36.6* 31.9* 32.9*  MCV 103.5* 100.9* 102.8* 100.9* 102.5*  PLT 69* 68* 107* 72* 70*    Basic Metabolic Panel: Recent Labs  Lab 05/11/21 0308 05/11/21 1504 05/13/21 1235 05/13/21 2050 05/14/21 0207 05/15/21 0329 05/16/21 0243  NA 130*  --  124* 128* 127* 128* 126*  K 2.9* 3.4* 3.7  --  3.5 3.6 3.4*  CL 98  --  89*  --  96* 98 95*  CO2 26   --  24  --  '24 23 24  '$ GLUCOSE 117*  --  79  --  94 104* 123*  BUN 8  --  16  --  '15 12 13  '$ CREATININE 0.82  --  0.72  --  0.84 0.71 0.71  CALCIUM 8.0*  --  8.7*  --  8.4* 8.5* 8.4*  MG  --   --   --   --   --   --  1.6*    GFR: Estimated Creatinine Clearance: 115.1 mL/min (by C-G formula based on SCr of 0.71 mg/dL). Liver Function Tests: Recent Labs  Lab 05/10/21 0327 05/11/21 0308 05/13/21 1235 05/14/21 0207 05/16/21 0243  AST 100* 74* 130* 108* 92*  ALT 116* 89* 108* 93* 81*  ALKPHOS 200* 172* 217* 175* 198*  BILITOT 8.2* 6.1* 13.2* 10.1* 7.2*  PROT 4.8* 4.2* 5.8* 4.9* 4.9*  ALBUMIN 2.0* 1.7* 2.5* 2.0* 1.8*    Recent Labs  Lab 05/13/21 1235  LIPASE 61*    Recent Labs  Lab 05/13/21 1236 05/14/21 0207 05/15/21 0329 05/16/21 0243  AMMONIA 36* 54* 90* 83*    Coagulation Profile: Recent Labs  Lab 05/10/21 0327 05/13/21 1235 05/14/21 0207  INR 1.7* 1.7* 1.8*    Cardiac Enzymes: No results for input(s): CKTOTAL, CKMB, CKMBINDEX, TROPONINI in the last 168 hours. BNP (last 3 results) No results for input(s): PROBNP in the last 8760 hours. HbA1C: No results for input(s): HGBA1C in the last 72 hours. CBG: No results for input(s): GLUCAP in the last 168 hours. Lipid Profile: No results for input(s): CHOL, HDL, LDLCALC, TRIG, CHOLHDL, LDLDIRECT in the last 72 hours. Thyroid Function Tests: No results for input(s): TSH, T4TOTAL, FREET4, T3FREE, THYROIDAB in the last 72 hours. Anemia Panel: No results for input(s): VITAMINB12, FOLATE, FERRITIN, TIBC, IRON, RETICCTPCT in the last 72 hours. Sepsis Labs: No results for input(s): PROCALCITON, LATICACIDVEN in the last 168 hours.  Recent Results (from the past 240 hour(s))  SARS CORONAVIRUS 2 (TAT 6-24 HRS) Nasopharyngeal Nasopharyngeal Swab     Status: None   Collection Time: 05/09/21  5:29 PM   Specimen: Nasopharyngeal Swab  Result Value Ref Range Status   SARS Coronavirus 2 NEGATIVE NEGATIVE Final    Comment:  (NOTE) SARS-CoV-2 target nucleic acids are NOT DETECTED.  The SARS-CoV-2 RNA is generally detectable in upper and lower respiratory specimens during the acute phase of infection. Negative results do not preclude SARS-CoV-2 infection, do not rule out co-infections with other pathogens, and should not be used as the sole basis for treatment or other patient management decisions. Negative results must be combined with clinical observations, patient history, and epidemiological information. The expected result is Negative.  Fact Sheet for Patients: SugarRoll.be  Fact Sheet for Healthcare Providers: https://www.woods-mathews.com/  This test is not yet approved or cleared by the [place] FDA and  has been  authorized for detection and/or diagnosis of SARS-CoV-2 by FDA under an Emergency Use Authorization (EUA). This EUA will remain  in effect (meaning this test can be used) for the duration of the COVID-19 declaration under Se ction 564(b)(1) of the Act, 21 U.S.C. section 360bbb-3(b)(1), unless the authorization is terminated or revoked sooner.  Performed at [facility], [place] [address] [place], [place] [address]   Resp Panel by RT-PCR (Flu A&B, Covid) Nasopharyngeal Swab     Status: None   Collection Time: 05/13/21 10:32 PM   Specimen: Nasopharyngeal Swab; Nasopharyngeal(NP) swabs in vial transport medium  Result Value Ref Range Status   SARS Coronavirus 2 by RT PCR NEGATIVE NEGATIVE Final    Comment: (NOTE) SARS-CoV-2 target nucleic acids are NOT DETECTED.  The SARS-CoV-2 RNA is generally detectable in upper respiratory specimens during the acute phase of infection. The lowest concentration of SARS-CoV-2 viral copies this assay can detect is 138 copies/mL. A negative result does not preclude SARS-Cov-2 infection and should not be used as the sole basis for treatment or other patient management decisions. A negative result may  occur with  improper specimen collection/handling, submission of specimen other than nasopharyngeal swab, presence of viral mutation(s) within the areas targeted by this assay, and inadequate number of viral copies(<138 copies/mL). A negative result must be combined with clinical observations, patient history, and epidemiological information. The expected result is Negative.  Fact Sheet for Patients:  EntrepreneurPulse.com.au  Fact Sheet for Healthcare Providers:  IncredibleEmployment.be  This test is no t yet approved or cleared by the [place] FDA and  has been authorized for detection and/or diagnosis of SARS-CoV-2 by FDA under an Emergency Use Authorization (EUA). This EUA will remain  in effect (meaning this test can be used) for the duration of the COVID-19 declaration under Section 564(b)(1) of the Act, 21 U.S.C.section 360bbb-3(b)(1), unless the authorization is terminated  or revoked sooner.       Influenza A by PCR NEGATIVE NEGATIVE Final   Influenza B by PCR NEGATIVE NEGATIVE Final    Comment: (NOTE) The Xpert Xpress SARS-CoV-2/FLU/RSV plus assay is intended as an aid in the diagnosis of influenza from Nasopharyngeal swab specimens and should not be used as a sole basis for treatment. Nasal washings and aspirates are unacceptable for Xpert Xpress SARS-CoV-2/FLU/RSV testing.  Fact Sheet for Patients: EntrepreneurPulse.com.au  Fact Sheet for Healthcare Providers: IncredibleEmployment.be  This test is not yet approved or cleared by the [place] FDA and has been authorized for detection and/or diagnosis of SARS-CoV-2 by FDA under an Emergency Use Authorization (EUA). This EUA will remain in effect (meaning this test can be used) for the duration of the COVID-19 declaration under Section 564(b)(1) of the Act, 21 U.S.C. section 360bbb-3(b)(1), unless the authorization is terminated  or revoked.  Performed at [facility], [place] [address] [place], [place] [address]       Radiology Studies: No results found.  Scheduled Meds:  baclofen  10 mg Oral QHS   cholestyramine  4 g Oral BID   furosemide  40 mg Intravenous Q12H   lactulose  30 g Oral TID   levETIRAcetam  1,500 mg Oral BID   melatonin  3 mg Oral QHS   neomycin-bacitracin-polymyxin  1 application Topical Daily   phytonadione  5 mg Oral Once   [START ON 05/17/2021] potassium chloride  10 mEq Oral BID   rifaximin  550 mg Oral BID   spironolactone  200 mg Oral q morning  Continuous Infusions:   LOS: 3 days   Marylu Lund, MD [facility] Pager On Amion  If 7PM-7AM, please contact night-coverage 05/16/2021, 3:10 PM   \

## 2021-05-16 NOTE — TOC Initial Note (Signed)
Transition of Care [facility]) - Initial/Assessment Note    Patient Details  Name: Author Slaven MRN: [id number] Date of Birth: Mar 17, 1966  [facility] CM/SW Contact:    Angelita Ingles, RN Phone Number:[phone]  05/16/2021, 2:51 PM  Clinical Narrative:                 [facility] consulted for patient with high risk for readmission. Patient states that he is home where he has a room mate. Patient states that he has a primary MD that he follows up with and receives scripts for his medication. Patient reports that he has transportation through his insurance. Patient currently has DME walker, cane and 3 in 1. No other recommendations noted at this time. Patient states that he is feeling a little tired and is unable to participate in a lot of details about discharge planning. CM made patient aware that TOC will continue to follow for any needs.   Expected Discharge Plan: Home/Self Care Barriers to Discharge: Continued Medical Work up   Patient Goals and CMS Choice Patient states their goals for this hospitalization and ongoing recovery are:: To get better and go home   Choice offered to / list presented to : NA  Expected Discharge Plan and Services Expected Discharge Plan: Home/Self Care In-house Referral: NA Discharge Planning Services: CM Consult Post Acute Care Choice: NA Living arrangements for the past 2 months: Single Family Home                 DME Arranged: N/A DME Agency: NA         HH Agency: [place]        Prior Living Arrangements/Services Living arrangements for the past 2 months: Single Family Home Lives with:: Roommate Patient language and need for interpreter reviewed:: Yes Do you feel safe going back to the place where you live?: Yes      Need for Family Participation in Patient Care: Yes (Comment) Care giver support system in place?: Yes (comment) Current home services: DME    Activities of Daily Living      Permission  Sought/Granted   Permission granted to share information with : No              Emotional Assessment Appearance:: Appears older than stated age Attitude/Demeanor/Rapport: Gracious Affect (typically observed): Pleasant Orientation: : Oriented to Self, Oriented to Place, Oriented to  Time, Oriented to Situation Alcohol / Substance Use: Not Applicable Psych Involvement: No (comment)  Admission diagnosis:  Hepatic encephalopathy ([place]) [K72.90] Hepatocellular carcinoma ([place]) [C22.0] Hyponatremia XX123456 Alcoholic liver disease ([place]) [K70.9] Hyperammonemia ([place]) [E72.20] LFT elevation [R79.89] History of behavioral and mental health problems [Z86.59] Acute alteration in mental status [R41.82] Patient Active Problem List   Diagnosis Date Noted   Hyperammonemia ([place])    Tobacco dependence 05/09/2021   Left displaced femoral neck fracture ([place])    Closed fracture of left distal radius and ulna, initial encounter    Closed left hip fracture ([place]) 07/22/2019   Left wrist fracture 07/22/2019   Rib fracture 07/22/2019   Ascites [id number]   Alcoholic cirrhosis of liver with ascites ([place]) 07/19/2018   Left hydrocele 0000000   Umbilical hernia without obstruction and without gangrene 07/19/2018   Displaced fracture of distal end of right radius 12/23/2016   Possible exposure to STD 06/07/2016   S/P total knee replacement using cement 07/15/2015   Bipolar affective disorder ([place]) 05/27/2015   Depression 05/27/2015   Thrombocytopenia ([place]) 05/27/2015  History of alcohol abuse 05/27/2015   Seizure ([place]) 05/27/2015   Brain disorder 05/27/2015   Chest pain 04/30/2015   Hepatocellular carcinoma ([place]) 02/15/2015   Left inguinal hernia 10/22/2014   Hyponatremia 05/06/2014   Diarrhea 04/30/2014   Wilson's disease 09/08/2013   UTI (urinary tract infection) 07/29/2013   Fracture of tibial plateau 07/24/2013   ED (erectile dysfunction) of organic origin 01/07/2013   Hypokalemia  10/21/2011   Eczema 10/21/2011   Impaired glucose tolerance 10/19/2011   Hepatic encephalopathy ([place]) 08/05/2011   Disorder of stomach and duodenum 08/03/2011   Esophageal varices ([place]) 08/03/2011   Portal hypertension ([place]) 08/03/2011   Seizures ([place])    Encephalopathy    Preventative health care 04/14/2011   Follow-up examination following surgery 02/23/2011   Chronic liver disease and cirrhosis ([place]) 06/10/2009   PCP:  Cher Nakai, MD Pharmacy:   [id number] [id number]: [phone]: 3[id number]: [phone]: [phone]    Social Determinants of Health (SDOH) Interventions    Readmission Risk Interventions Readmission Risk Prevention Plan 05/16/2021  Transportation Screening Complete  PCP or Specialist Appt within 3-5 Days Complete  HRI or [place]  Social Work Consult for [place]/Counseling Complete  Palliative Care Screening Not Applicable  Medication Review (RN Care Manager) Referral to Pharmacy  Some recent data might be hidden

## 2021-05-17 ENCOUNTER — Other Ambulatory Visit (HOSPITAL_COMMUNITY): Payer: Self-pay

## 2021-05-17 LAB — MAGNESIUM: Magnesium: 1.4 mg/dL — ABNORMAL LOW (ref 1.7–2.4)

## 2021-05-17 LAB — BASIC METABOLIC PANEL
Anion gap: 7 (ref 5–15)
BUN: 10 mg/dL (ref 6–20)
CO2: 22 mmol/L (ref 22–32)
Calcium: 8.5 mg/dL — ABNORMAL LOW (ref 8.9–10.3)
Chloride: 95 mmol/L — ABNORMAL LOW (ref 98–111)
Creatinine, Ser: 0.59 mg/dL — ABNORMAL LOW (ref 0.61–1.24)
GFR, Estimated: >60 mL/min (ref >60)
Glucose, Bld: 96 mg/dL (ref 70–99)
Potassium: 3.7 mmol/L (ref 3.5–5.1)
Sodium: 124 mmol/L — ABNORMAL LOW (ref 135–145)

## 2021-05-17 LAB — CBC
HCT: 32 % — ABNORMAL LOW (ref 39.0–52.0)
Hemoglobin: 11.1 g/dL — ABNORMAL LOW (ref 13.0–17.0)
MCH: 35.2 pg — ABNORMAL HIGH (ref 26.0–34.0)
MCHC: 34.7 g/dL (ref 30.0–36.0)
MCV: 101.6 fL — ABNORMAL HIGH (ref 80.0–100.0)
Platelets: 90 10*3/uL — ABNORMAL LOW (ref 150–400)
RBC: 3.15 MIL/uL — ABNORMAL LOW (ref 4.22–5.81)
RDW: 17.2 % — ABNORMAL HIGH (ref 11.5–15.5)
WBC: 10.5 10*3/uL (ref 4.0–10.5)
nRBC: 0 % (ref 0.0–0.2)

## 2021-05-17 LAB — PHOSPHORUS: Phosphorus: 2.6 mg/dL (ref 2.5–4.6)

## 2021-05-17 LAB — AMMONIA: Ammonia: 37 umol/L — ABNORMAL HIGH (ref 9–35)

## 2021-05-17 MED ORDER — RIFAXIMIN 550 MG PO TABS
550.0000 mg | ORAL_TABLET | Freq: Two times a day (BID) | ORAL | 0 refills | Status: DC
  Filled 2021-05-17: qty 56, 28d supply, fill #0

## 2021-05-17 MED ORDER — LACTULOSE 10 GM/15ML PO SOLN
20.0000 g | Freq: Three times a day (TID) | ORAL | Status: DC
  Administered 2021-05-17: 20 g via ORAL
  Filled 2021-05-17 (×2): qty 30

## 2021-05-17 MED ORDER — MAGNESIUM SULFATE 4 GM/100ML IV SOLN
4.0000 g | Freq: Once | INTRAVENOUS | Status: AC
  Administered 2021-05-17: 4 g via INTRAVENOUS
  Filled 2021-05-17: qty 100

## 2021-05-17 NOTE — Discharge Summary (Signed)
Physician Discharge Summary  [name] [id number] DOB: 28-Aug-1966 DOA: 05/13/2021  PCP: Cher Nakai, MD  Admit date: 05/13/2021 Discharge date: 05/17/2021  Admitted From: Home Disposition:  Home  Recommendations for Outpatient Follow-up:  Follow up with PCP in 1-2 weeks Follow up with GI as scheduled  Discharge Condition:Improved CODE STATUS:Full Diet recommendation: Low sodium   Brief/Interim Summary: 55 y.o. male with medical history significant of cirrhosis secondary to alcohol abuse, recent discharge from the hospital on 05/11/2021 for hepatic encephalopathy, esophageal varices, hepatocellular carcinoma status post TACE, seizure disorder, bipolar disorder, who presented to the hospital with altered mental status    Discharge Diagnoses:  Principal Problem:   Hepatic encephalopathy ([place]) Active Problems:   Chronic liver disease and cirrhosis ([place])   Alcoholic cirrhosis of liver with ascites ([place])  Acute hepatic encephalopathy:  -Improved with lactulose '30mg'$  tid, BM this AM with rixamimin -Ammonia levels improved and mentation normalized -Recommend f/u with primary GI as originally planned   Acute on chronic hyponatremia:  -remained stable -cont diuretics per home regimen   Decompensated liver cirrhosis, peripheral edema, hyperbilirubinemia, coagulopathy, hepatocellular carcinoma, chronic thrombocytopenia:  -Pt is s/p ablation.  Continue IV Lasix and Aldactone Recommend close f/u with [facility] liver transplant center.   Seizure disorder: Continue Keppra -Remained seizure-free     Discharge Instructions   Allergies as of 05/17/2021       Reactions   Codeine Nausea And Vomiting, Other (See Comments)   Reports it makes me crazy.        Medication List     STOP taking these medications    rOPINIRole 1 MG tablet Commonly known as: REQUIP       TAKE these medications    acetaminophen 500 MG tablet Commonly known as: TYLENOL Take 1,500 mg by mouth  every 6 (six) hours as needed (pain).   baclofen 10 MG tablet Commonly known as: LIORESAL Take 10 mg by mouth at bedtime.   diphenhydrAMINE 25 MG tablet Commonly known as: BENADRYL Take 50 mg by mouth at bedtime.   furosemide 40 MG tablet Commonly known as: Lasix Take 2 tablets (80 mg total) by mouth 2 (two) times daily. What changed: when to take this   lactulose 10 GM/15ML solution Commonly known as: CHRONULAC Take 30 mLs (20 g total) by mouth 3 (three) times daily. What changed:  how much to take when to take this additional instructions   levETIRAcetam 500 MG tablet Commonly known as: KEPPRA Take 3 tablets (1,500 mg total) by mouth 2 (two) times daily.   Magnesium 500 MG Tabs Take 500 mg by mouth every morning.   oxyCODONE 5 MG immediate release tablet Commonly known as: Oxy IR/ROXICODONE Take 5 mg by mouth every 4 (four) hours as needed for pain.   potassium chloride 10 MEQ tablet Commonly known as: KLOR-CON TAKE ONE TABLET BY MOUTH TWICE DAILY What changed: how much to take   rifaximin 550 MG Tabs tablet Commonly known as: XIFAXAN Take 1 tablet (550 mg total) by mouth 2 (two) times daily.   spironolactone 100 MG tablet Commonly known as: ALDACTONE Take 2 tablets (200 mg total) by mouth every morning.   Triple Antibiotic Oint Apply 1 application topically daily.   vitamin A 3 MG (10000 UNITS) capsule Take 10,000 Units by mouth every morning.   Vitamin D-3 125 MCG (5000 UT) Tabs Take 5,000 Units by mouth every morning.   Zinc 50 MG Tabs Take 50 mg by mouth 3 (three) times daily.  Follow-up Information     Cher Nakai, MD Follow up in 2 week(s).   Specialty: Internal Medicine Why: Hospital follow up Contact information: [address] [place] [address] [phone]         Follow up with your Gastroenteroogist as scheduled Follow up.   Why: Hospital follow up               Allergies  Allergen Reactions    Codeine Nausea And Vomiting and Other (See Comments)    Reports it makes me crazy.     Consultations: Discussed case with GI over phone  Procedures/Studies: CT HEAD WO CONTRAST (5MM)  Result Date: 05/09/2021 CLINICAL DATA:  Delirium, altered mental status EXAM: CT HEAD WITHOUT CONTRAST TECHNIQUE: Contiguous axial images were obtained from the base of the skull through the vertex without intravenous contrast. COMPARISON:  07/21/2019, 11/29/2008 FINDINGS: Brain: Stable mild atrophy pattern. No acute intracranial hemorrhage, mass lesion, acute infarction, midline shift, herniation, hydrocephalus, or extra-axial fluid collection. Cisterns are patent. Cerebellar atrophy as well. Vascular: No hyperdense vessel or unexpected calcification. Skull: Normal. Negative for fracture or focal lesion. Sinuses/Orbits: No acute finding. Other: None. IMPRESSION: Stable atrophy pattern. No acute intracranial abnormality by noncontrast CT. Electronically Signed   By: Jerilynn Mages.  Shick M.D.   On: 05/09/2021 14:48   CT ABDOMEN PELVIS W CONTRAST  Result Date: 05/09/2021 CLINICAL DATA:  Fever and abdominal pain, postoperative. History of [place] post ablation 1 week ago. Laparoscopy 1 week ago. EXAM: CT ABDOMEN AND PELVIS WITH CONTRAST TECHNIQUE: Multidetector CT imaging of the abdomen and pelvis was performed using the standard protocol following bolus administration of intravenous contrast. CONTRAST:  52m OMNIPAQUE IOHEXOL 350 MG/ML SOLN COMPARISON:  Abdominal ultrasound 05/09/2021. MRI abdomen 01/21/2015. FINDINGS: Lower chest: There is atelectasis in the lung bases. Hepatobiliary: Gallstones are present. There is gallbladder wall thickening/edema diffusely. There is no definite biliary ductal dilatation. There is cirrhotic liver contour. Branching hypodensity throughout the liver may represent intrahepatic biliary ductal dilatation, periportal edema or abnormal portal or hepatic veins. There are 2 hypodense lobulated heterogeneous  areas in the inferior right lobe of the liver which may represent recently treated hepatic masses. The more inferior mass measures 5.2 by 4.5 cm. The mass located more superiorly measures 4.8 by 4.2 cm. Pancreas: Unremarkable. No pancreatic ductal dilatation or surrounding inflammatory changes. Spleen: Mildly enlarged, similar to the prior study. Adrenals/Urinary Tract: The adrenal glands are within normal limits. There is no hydronephrosis or perinephric fluid. There is a rounded hypodensity in the right kidney which is too small to characterize likely cyst. The bladder is grossly within normal limits, but evaluation of the bladder is limited secondary to streak artifact in the pelvis. Stomach/Bowel: There is marked wall thickening of the ascending colon and transverse colon. The appendix appears within normal limits. The distal colon appears within normal limits. There is also some wall thickening of small bowel loops throughout the mid abdomen which are mildly dilated measuring up to 3.2 cm. Questionable pneumatosis identified within left-sided small bowel loops image 3/57. There is a small amount of free air in the upper abdomen. Vascular/Lymphatic: Aorta and IVC are normal in size. Splenic varices and large splenorenal shunt are again noted. Superior mesenteric vein grossly patent. Portal vein is not visualized and portal vein thrombosis is not excluded. Reproductive: Prostate is unremarkable. Other: There is a moderate amount of ascites. There is diffuse body wall edema. Large fluid collection visualized in the left scrotal region. There is  a small umbilical hernia containing fat and air. Bilateral gynecomastia. Musculoskeletal: The bones are diffusely osteopenic. There is an acute or subacute fracture of the superior endplate of L4 with [id number] loss vertebral body height. There is minimal retropulsion of fracture fragments. There is trace compression deformity of the superior endplate of 624THL which appears  chronic, but is age indeterminate left hip arthroplasty is present. IMPRESSION: 1. There is free air in the upper abdomen. This may be related to recent laparoscopy; however, given other abnormal bowel findings listed below, bowel perforation cannot be excluded. 2. Marked wall thickening of the ascending colon and transverse colon worrisome for nonspecific colitis including infectious, inflammatory and ischemic etiologies. 3. Wall thickening and mild dilatation of small-bowel loops in the central abdomen with questionable pneumatosis. Findings may represent ischemic enteritis. Other etiologies for enteritis not excluded. 4. Moderate amount of ascites. 5. Findings compatible with cirrhosis and portal hypertension. There are 2 lesions in the right lobe of the liver likely related to recent treated cancer. 6. Cholelithiasis. There is diffuse gallbladder wall edema which may be reactive secondary to 2 ascites/hepatocellular disease; however, cholecystitis cannot be excluded. 7. The portal vein is not opacified. Portal vein thrombosis is not excluded. 8. Splenomegaly and varices appear grossly unchanged compatible with portal systemic hypertension. 9. Acute or subacute compression fracture superior endplate of L4. 10. Large amount of left scrotal fluid. Recommend clinical correlation and follow-up. 11. These results were called by telephone at the time of interpretation on 05/09/2021 at 10:02 pm to provider Dr. Regenia Skeeter, who verbally acknowledged these results. Electronically Signed   By: Ronney Asters M.D.   On: 05/09/2021 22:06   DG Chest Portable 1 View  Result Date: 05/09/2021 CLINICAL DATA:  Altered mental status, history cirrhosis EXAM: PORTABLE CHEST 1 VIEW COMPARISON:  Portable exam 1345 hours compared to 07/21/2019 FINDINGS: Normal heart size, mediastinal contours, and pulmonary vascularity. Lungs clear. No infiltrate, pleural effusion, or pneumothorax. No acute osseous findings. IMPRESSION: No acute  abnormalities. Electronically Signed   By: Lavonia Lyndon M.D.   On: 05/09/2021 14:03   US Abdomen Limited RUQ (LIVER/GB)  Result Date: 05/13/2021 CLINICAL DATA:  Elevated LFTs. EXAM: ULTRASOUND ABDOMEN LIMITED RIGHT UPPER QUADRANT COMPARISON:  CT abdomen and pelvis 05/09/2021. FINDINGS: Gallbladder: Multiple gallstones are present measuring up to 1.8 cm. There is gallbladder wall thickening measuring 8.6 mm. Sonographic Percell Miller sign is negative per sonographer. There is trace pericholecystic fluid. Common bile duct: Diameter: 4.2 mm. Liver: There is a nodular liver contour. 2 masses are identified in the right lobe of the liver as seen on recent CT. The more anterior mass measures 4.0 x 3.5 x 3.5 cm. The more posterior mass measures 5.8 x 4.3 x 4.7 cm. Portal vein is patent on color Doppler imaging with normal direction of blood flow towards the liver. Other: There is moderate ascites in the upper abdomen. There is some soft tissue thickening and nodularity within the ascites anterior to the liver. IMPRESSION: 1. Cholelithiasis with gallbladder wall thickening and pericholecystic fluid. Sonographic Percell Miller sign is negative and there is no biliary ductal dilatation. Acute or chronic cholecystitis cannot be excluded. 2. There are 2 liver masses, grossly unchanged. 3. Moderate ascites. There is soft tissue nodularity within the ascites which may be related to peritoneal carcinomatosis. Electronically Signed   By: Ronney Asters M.D.   On: 05/13/2021 17:33   US Abdomen Limited RUQ (LIVER/GB)  Result Date: 05/09/2021 CLINICAL DATA:  Right upper quadrant pain. Altered mental status. History  of [place] post ablation 1 week ago. EXAM: ULTRASOUND ABDOMEN LIMITED RIGHT UPPER QUADRANT COMPARISON:  No recent imaging. Most recent CT available 12/24/2014. FINDINGS: Gallbladder: Shadowing gallstones. Diffuse wall thickening up to 7 mm. No sonographic Murphy sign noted by sonographer. Common bile duct: Diameter: Not well visualized,  but likely 3-4 mm. Liver: Cirrhotic hepatic morphology with nodular contours and diffusely increased parenchymal echogenicity. There are 2 hypoechoic lesions, 1 in the anterior right lobe measuring 3.8 x 3.7 x 2.8 cm. Additional lesion in the posterior right lobe measures 3.9 x 3.1 x 3.6 cm. Nonspecific rind of increased echogenicity in the periphery of the right lobe adjacent to 1 of these liver lesions. Portal vein is patent on color Doppler imaging with reversal of blood flow away from the liver. Other: Right upper quadrant ascites, with fluid appearing relatively simple. IMPRESSION: 1. Hepatic cirrhosis. Two hypodense liver lesions. One of these lesions has a nonspecific rind of increased echogenicity adjacent which is nonspecific by ultrasound. There is no interval comparison imaging since 2016, and findings are nonspecific. Given clinical concern for procedure complications such as bleeding, recommend abdominopelvic CT (with IV contrast in the absence of contraindication). 2. Right upper quadrant ascites, appears simple. 3. Gallstones. Diffuse gallbladder wall thickening, nonspecific but likely due to chronic liver disease. 4. Reversal of blood flow in the main portal vein. Electronically Signed   By: Keith Rake M.D.   On: 05/09/2021 18:20   IR Paracentesis  Result Date: 05/10/2021 INDICATION: Patient with a history of cirrhosis, hepatocellular carcinoma and recurrent ascites since today for diagnostic and therapeutic paracentesis. EXAM: ULTRASOUND GUIDED PARACENTESIS MEDICATIONS: 1% lidocaine 10 mL COMPLICATIONS: None immediate. PROCEDURE: Informed written consent was obtained from the patient after a discussion of the risks, benefits and alternatives to treatment. A timeout was performed prior to the initiation of the procedure. Initial ultrasound scanning demonstrates a large amount of ascites within the left lower abdominal quadrant. The left lower abdomen was prepped and draped in the usual  sterile fashion. 1% lidocaine was used for local anesthesia. Following this, a 19 gauge, 7-cm, Yueh catheter was introduced. An ultrasound image was saved for documentation purposes. The paracentesis was performed. The catheter was removed and a dressing was applied. The patient tolerated the procedure well without immediate post procedural complication. FINDINGS: A total of approximately 2.8 L of yellow fluid was removed. Samples were sent to the laboratory as requested by the clinical team. IMPRESSION: Successful ultrasound-guided paracentesis yielding 2.8 liters of peritoneal fluid. Read by: Soyla Dryer, NP Electronically Signed   By: Corrie Mckusick D.O.   On: 05/10/2021 16:35    Subjective: Eager to go home  Discharge Exam: Vitals:   05/16/21 2141 05/17/21 0504  BP: (!) 127/59 128/64  Pulse: 71 74  Resp: 18 18  Temp: 98.1 F (36.7 C) 98.4 F (36.9 C)  SpO2: 100% 94%   Vitals:   05/16/21 0728 05/16/21 1607 05/16/21 2141 05/17/21 0504  BP: 133/60 (!) 115/52 (!) 127/59 128/64  Pulse: 72 69 71 74  Resp: '19 18 18 18  '$ Temp: 98 F (36.7 C) 97.9 F (36.6 C) 98.1 F (36.7 C) 98.4 F (36.9 C)  TempSrc: Oral Oral Oral Oral  SpO2: 100% 95% 100% 94%  Height:        General: Pt is alert, awake, not in acute distress Cardiovascular: RRR, S1/S2 + Respiratory: CTA bilaterally, no wheezing, no rhonchi Abdominal: Soft, NT, ND, bowel sounds + Extremities: no edema, no cyanosis   The results  of significant diagnostics from this hospitalization (including imaging, microbiology, ancillary and laboratory) are listed below for reference.     Microbiology: Recent Results (from the past 240 hour(s))  SARS CORONAVIRUS 2 (TAT 6-24 HRS) Nasopharyngeal Nasopharyngeal Swab     Status: None   Collection Time: 05/09/21  5:29 PM   Specimen: Nasopharyngeal Swab  Result Value Ref Range Status   SARS Coronavirus 2 NEGATIVE NEGATIVE Final    Comment: (NOTE) SARS-CoV-2 target nucleic acids are NOT  DETECTED.  The SARS-CoV-2 RNA is generally detectable in upper and lower respiratory specimens during the acute phase of infection. Negative results do not preclude SARS-CoV-2 infection, do not rule out co-infections with other pathogens, and should not be used as the sole basis for treatment or other patient management decisions. Negative results must be combined with clinical observations, patient history, and epidemiological information. The expected result is Negative.  Fact Sheet for Patients: SugarRoll.be  Fact Sheet for Healthcare Providers: https://www.woods-mathews.com/  This test is not yet approved or cleared by the [place] FDA and  has been authorized for detection and/or diagnosis of SARS-CoV-2 by FDA under an Emergency Use Authorization (EUA). This EUA will remain  in effect (meaning this test can be used) for the duration of the COVID-19 declaration under Se ction 564(b)(1) of the Act, 21 U.S.C. section 360bbb-3(b)(1), unless the authorization is terminated or revoked sooner.  Performed at [facility], [place] [address] [place], [place] [address]   Resp Panel by RT-PCR (Flu A&B, Covid) Nasopharyngeal Swab     Status: None   Collection Time: 05/13/21 10:32 PM   Specimen: Nasopharyngeal Swab; Nasopharyngeal(NP) swabs in vial transport medium  Result Value Ref Range Status   SARS Coronavirus 2 by RT PCR NEGATIVE NEGATIVE Final    Comment: (NOTE) SARS-CoV-2 target nucleic acids are NOT DETECTED.  The SARS-CoV-2 RNA is generally detectable in upper respiratory specimens during the acute phase of infection. The lowest concentration of SARS-CoV-2 viral copies this assay can detect is 138 copies/mL. A negative result does not preclude SARS-Cov-2 infection and should not be used as the sole basis for treatment or other patient management decisions. A negative result may occur with  improper specimen  collection/handling, submission of specimen other than nasopharyngeal swab, presence of viral mutation(s) within the areas targeted by this assay, and inadequate number of viral copies(<138 copies/mL). A negative result must be combined with clinical observations, patient history, and epidemiological information. The expected result is Negative.  Fact Sheet for Patients:  EntrepreneurPulse.com.au  Fact Sheet for Healthcare Providers:  IncredibleEmployment.be  This test is no t yet approved or cleared by the [place] FDA and  has been authorized for detection and/or diagnosis of SARS-CoV-2 by FDA under an Emergency Use Authorization (EUA). This EUA will remain  in effect (meaning this test can be used) for the duration of the COVID-19 declaration under Section 564(b)(1) of the Act, 21 U.S.C.section 360bbb-3(b)(1), unless the authorization is terminated  or revoked sooner.       Influenza A by PCR NEGATIVE NEGATIVE Final   Influenza B by PCR NEGATIVE NEGATIVE Final    Comment: (NOTE) The Xpert Xpress SARS-CoV-2/FLU/RSV plus assay is intended as an aid in the diagnosis of influenza from Nasopharyngeal swab specimens and should not be used as a sole basis for treatment. Nasal washings and aspirates are unacceptable for Xpert Xpress SARS-CoV-2/FLU/RSV testing.  Fact Sheet for Patients: EntrepreneurPulse.com.au  Fact Sheet for Healthcare Providers: IncredibleEmployment.be  This test is not  yet approved or cleared by the [place] and has been authorized for detection and/or diagnosis of SARS-CoV-2 by FDA under an Emergency Use Authorization (EUA). This EUA will remain in effect (meaning this test can be used) for the duration of the COVID-19 declaration under Section 564(b)(1) of the Act, 21 U.S.C. section 360bbb-3(b)(1), unless the authorization is terminated or revoked.  Performed at [facility], [place] [address], [place], [place] [address]      Labs: BNP (last 3 results) No results for input(s): BNP in the last 8760 hours. Basic Metabolic Panel: Recent Labs  Lab 05/13/21 1235 05/13/21 2050 05/14/21 0207 05/15/21 0329 05/16/21 0243 05/17/21 0452  NA 124* 128* 127* 128* 126* 124*  K 3.7  --  3.5 3.6 3.4* 3.7  CL 89*  --  96* 98 95* 95*  CO2 24  --  '24 23 24 22  '$ GLUCOSE 79  --  94 104* 123* 96  BUN 16  --  '15 12 13 10  '$ CREATININE 0.72  --  0.84 0.71 0.71 0.59*  CALCIUM 8.7*  --  8.4* 8.5* 8.4* 8.5*  MG  --   --   --   --  1.6* 1.4*  PHOS  --   --   --   --   --  2.6   Liver Function Tests: Recent Labs  Lab 05/11/21 0308 05/13/21 1235 05/14/21 0207 05/16/21 0243  AST 74* 130* 108* 92*  ALT 89* 108* 93* 81*  ALKPHOS 172* 217* 175* 198*  BILITOT 6.1* 13.2* 10.1* 7.2*  PROT 4.2* 5.8* 4.9* 4.9*  ALBUMIN 1.7* 2.5* 2.0* 1.8*   Recent Labs  Lab 05/13/21 1235  LIPASE 61*   Recent Labs  Lab 05/13/21 1236 05/14/21 0207 05/15/21 0329 05/16/21 0243 05/17/21 0819  AMMONIA 36* 54* 90* 83* 37*   CBC: Recent Labs  Lab 05/11/21 0308 05/13/21 1235 05/14/21 0207 05/15/21 0329 05/17/21 0452  WBC 7.4 10.7* 8.0 9.1 10.5  NEUTROABS 4.7 8.0*  --  6.4  --   HGB 11.8* 12.6* 10.8* 11.0* 11.1*  HCT 34.0*  31.4* 36.6* 31.9* 32.9* 32.0*  MCV 100.9* 102.8* 100.9* 102.5* 101.6*  PLT 68* 107* 72* 70* 90*   Cardiac Enzymes: No results for input(s): CKTOTAL, CKMB, CKMBINDEX, TROPONINI in the last 168 hours. BNP: Invalid input(s): POCBNP CBG: No results for input(s): GLUCAP in the last 168 hours. D-Dimer No results for input(s): DDIMER in the last 72 hours. Hgb A1c No results for input(s): HGBA1C in the last 72 hours. Lipid Profile No results for input(s): CHOL, HDL, LDLCALC, TRIG, CHOLHDL, LDLDIRECT in the last 72 hours. Thyroid function studies No results for input(s): TSH, T4TOTAL, T3FREE, THYROIDAB in the last 72 hours.  Invalid input(s):  FREET3 Anemia work up No results for input(s): VITAMINB12, FOLATE, FERRITIN, TIBC, IRON, RETICCTPCT in the last 72 hours. Urinalysis    Component Value Date/Time   COLORURINE AMBER (A) 05/13/2021 1235   APPEARANCEUR CLEAR 05/13/2021 1235   LABSPEC 1.010 05/13/2021 1235   PHURINE 6.5 05/13/2021 1235   GLUCOSEU NEGATIVE 05/13/2021 1235   GLUCOSEU NEGATIVE 06/11/2019 1128   HGBUR TRACE (A) 05/13/2021 1235   BILIRUBINUR SMALL (A) 05/13/2021 1235   KETONESUR NEGATIVE 05/13/2021 1235   PROTEINUR NEGATIVE 05/13/2021 1235   UROBILINOGEN 2.0 (A) 06/11/2019 1128   NITRITE NEGATIVE 05/13/2021 1235   LEUKOCYTESUR NEGATIVE 05/13/2021 1235   Sepsis Labs Invalid input(s): PROCALCITONIN,  WBC,  LACTICIDVEN Microbiology Recent Results (from the past 240 hour(s))  SARS CORONAVIRUS 2 (TAT 6-24 HRS) Nasopharyngeal Nasopharyngeal Swab     Status: None   Collection Time: 05/09/21  5:29 PM   Specimen: Nasopharyngeal Swab  Result Value Ref Range Status   SARS Coronavirus 2 NEGATIVE NEGATIVE Final    Comment: (NOTE) SARS-CoV-2 target nucleic acids are NOT DETECTED.  The SARS-CoV-2 RNA is generally detectable in upper and lower respiratory specimens during the acute phase of infection. Negative results do not preclude SARS-CoV-2 infection, do not rule out co-infections with other pathogens, and should not be used as the sole basis for treatment or other patient management decisions. Negative results must be combined with clinical observations, patient history, and epidemiological information. The expected result is Negative.  Fact Sheet for Patients: SugarRoll.be  Fact Sheet for Healthcare Providers: https://www.woods-mathews.com/  This test is not yet approved or cleared by the [place] FDA and  has been authorized for detection and/or diagnosis of SARS-CoV-2 by FDA under an Emergency Use Authorization (EUA). This EUA will remain  in effect  (meaning this test can be used) for the duration of the COVID-19 declaration under Se ction 564(b)(1) of the Act, 21 U.S.C. section 360bbb-3(b)(1), unless the authorization is terminated or revoked sooner.  Performed at [facility], [place] [address] [place], [place] [address]   Resp Panel by RT-PCR (Flu A&B, Covid) Nasopharyngeal Swab     Status: None   Collection Time: 05/13/21 10:32 PM   Specimen: Nasopharyngeal Swab; Nasopharyngeal(NP) swabs in vial transport medium  Result Value Ref Range Status   SARS Coronavirus 2 by RT PCR NEGATIVE NEGATIVE Final    Comment: (NOTE) SARS-CoV-2 target nucleic acids are NOT DETECTED.  The SARS-CoV-2 RNA is generally detectable in upper respiratory specimens during the acute phase of infection. The lowest concentration of SARS-CoV-2 viral copies this assay can detect is 138 copies/mL. A negative result does not preclude SARS-Cov-2 infection and should not be used as the sole basis for treatment or other patient management decisions. A negative result may occur with  improper specimen collection/handling, submission of specimen other than nasopharyngeal swab, presence of viral mutation(s) within the areas targeted by this assay, and inadequate number of viral copies(<138 copies/mL). A negative result must be combined with clinical observations, patient history, and epidemiological information. The expected result is Negative.  Fact Sheet for Patients:  EntrepreneurPulse.com.au  Fact Sheet for Healthcare Providers:  IncredibleEmployment.be  This test is no t yet approved or cleared by the [place] FDA and  has been authorized for detection and/or diagnosis of SARS-CoV-2 by FDA under an Emergency Use Authorization (EUA). This EUA will remain  in effect (meaning this test can be used) for the duration of the COVID-19 declaration under Section 564(b)(1) of the Act, 21 U.S.C.section 360bbb-3(b)(1),  unless the authorization is terminated  or revoked sooner.       Influenza A by PCR NEGATIVE NEGATIVE Final   Influenza B by PCR NEGATIVE NEGATIVE Final    Comment: (NOTE) The Xpert Xpress SARS-CoV-2/FLU/RSV plus assay is intended as an aid in the diagnosis of influenza from Nasopharyngeal swab specimens and should not be used as a sole basis for treatment. Nasal washings and aspirates are unacceptable for Xpert Xpress SARS-CoV-2/FLU/RSV testing.  Fact Sheet for Patients: EntrepreneurPulse.com.au  Fact Sheet for Healthcare Providers: IncredibleEmployment.be  This test is not yet approved or cleared by the [place] FDA and has been authorized for detection and/or diagnosis of SARS-CoV-2 by FDA under an Emergency Use Authorization (EUA). This EUA will  remain in effect (meaning this test can be used) for the duration of the COVID-19 declaration under Section 564(b)(1) of the Act, 21 U.S.C. section 360bbb-3(b)(1), unless the authorization is terminated or revoked.  Performed at [facility], [place] [address] [place], [place] [address]    Time spent: 30 min  SIGNED:   Marylu Lund, MD  [facility] 05/17/2021, 2:05 PM  If 7PM-7AM, please contact night-coverage

## 2021-05-19 ENCOUNTER — Other Ambulatory Visit: Payer: Self-pay

## 2021-05-19 ENCOUNTER — Inpatient Hospital Stay (HOSPITAL_COMMUNITY)
Admission: EM | Admit: 2021-05-19 | Discharge: 2021-06-04 | DRG: 870 | Disposition: E | Payer: 59 | Attending: Pulmonary Disease | Admitting: Pulmonary Disease

## 2021-05-19 ENCOUNTER — Emergency Department (HOSPITAL_COMMUNITY): Payer: 59

## 2021-05-19 ENCOUNTER — Inpatient Hospital Stay (HOSPITAL_COMMUNITY): Payer: 59

## 2021-05-19 DIAGNOSIS — R008 Other abnormalities of heart beat: Secondary | ICD-10-CM | POA: Diagnosis not present

## 2021-05-19 DIAGNOSIS — C220 Liver cell carcinoma: Secondary | ICD-10-CM | POA: Diagnosis present

## 2021-05-19 DIAGNOSIS — Z833 Family history of diabetes mellitus: Secondary | ICD-10-CM

## 2021-05-19 DIAGNOSIS — K920 Hematemesis: Secondary | ICD-10-CM | POA: Diagnosis not present

## 2021-05-19 DIAGNOSIS — R680 Hypothermia, not associated with low environmental temperature: Secondary | ICD-10-CM | POA: Diagnosis present

## 2021-05-19 DIAGNOSIS — Z9181 History of falling: Secondary | ICD-10-CM

## 2021-05-19 DIAGNOSIS — R6520 Severe sepsis without septic shock: Secondary | ICD-10-CM | POA: Diagnosis not present

## 2021-05-19 DIAGNOSIS — K7031 Alcoholic cirrhosis of liver with ascites: Secondary | ICD-10-CM | POA: Diagnosis present

## 2021-05-19 DIAGNOSIS — J9601 Acute respiratory failure with hypoxia: Secondary | ICD-10-CM | POA: Diagnosis not present

## 2021-05-19 DIAGNOSIS — Z83511 Family history of glaucoma: Secondary | ICD-10-CM

## 2021-05-19 DIAGNOSIS — E871 Hypo-osmolality and hyponatremia: Secondary | ICD-10-CM | POA: Diagnosis present

## 2021-05-19 DIAGNOSIS — K766 Portal hypertension: Secondary | ICD-10-CM | POA: Diagnosis present

## 2021-05-19 DIAGNOSIS — K7041 Alcoholic hepatic failure with coma: Secondary | ICD-10-CM | POA: Diagnosis present

## 2021-05-19 DIAGNOSIS — K7460 Unspecified cirrhosis of liver: Secondary | ICD-10-CM | POA: Diagnosis not present

## 2021-05-19 DIAGNOSIS — R569 Unspecified convulsions: Secondary | ICD-10-CM | POA: Diagnosis not present

## 2021-05-19 DIAGNOSIS — A408 Other streptococcal sepsis: Principal | ICD-10-CM | POA: Diagnosis present

## 2021-05-19 DIAGNOSIS — R6521 Severe sepsis with septic shock: Secondary | ICD-10-CM | POA: Diagnosis present

## 2021-05-19 DIAGNOSIS — Z452 Encounter for adjustment and management of vascular access device: Secondary | ICD-10-CM

## 2021-05-19 DIAGNOSIS — K7200 Acute and subacute hepatic failure without coma: Secondary | ICD-10-CM | POA: Diagnosis not present

## 2021-05-19 DIAGNOSIS — Z8249 Family history of ischemic heart disease and other diseases of the circulatory system: Secondary | ICD-10-CM

## 2021-05-19 DIAGNOSIS — R402443 Other coma, without documented Glasgow coma scale score, or with partial score reported, at hospital admission: Secondary | ICD-10-CM | POA: Diagnosis not present

## 2021-05-19 DIAGNOSIS — B1920 Unspecified viral hepatitis C without hepatic coma: Secondary | ICD-10-CM | POA: Diagnosis present

## 2021-05-19 DIAGNOSIS — K3189 Other diseases of stomach and duodenum: Secondary | ICD-10-CM | POA: Diagnosis present

## 2021-05-19 DIAGNOSIS — E872 Acidosis, unspecified: Secondary | ICD-10-CM

## 2021-05-19 DIAGNOSIS — N179 Acute kidney failure, unspecified: Secondary | ICD-10-CM | POA: Diagnosis present

## 2021-05-19 DIAGNOSIS — Z7189 Other specified counseling: Secondary | ICD-10-CM | POA: Diagnosis not present

## 2021-05-19 DIAGNOSIS — E785 Hyperlipidemia, unspecified: Secondary | ICD-10-CM | POA: Diagnosis present

## 2021-05-19 DIAGNOSIS — K7290 Hepatic failure, unspecified without coma: Secondary | ICD-10-CM

## 2021-05-19 DIAGNOSIS — D65 Disseminated intravascular coagulation [defibrination syndrome]: Secondary | ICD-10-CM | POA: Diagnosis present

## 2021-05-19 DIAGNOSIS — K7291 Hepatic failure, unspecified with coma: Secondary | ICD-10-CM

## 2021-05-19 DIAGNOSIS — Z978 Presence of other specified devices: Secondary | ICD-10-CM

## 2021-05-19 DIAGNOSIS — Z885 Allergy status to narcotic agent status: Secondary | ICD-10-CM

## 2021-05-19 DIAGNOSIS — A419 Sepsis, unspecified organism: Secondary | ICD-10-CM

## 2021-05-19 DIAGNOSIS — R579 Shock, unspecified: Secondary | ICD-10-CM | POA: Diagnosis not present

## 2021-05-19 DIAGNOSIS — K7201 Acute and subacute hepatic failure with coma: Secondary | ICD-10-CM | POA: Diagnosis not present

## 2021-05-19 DIAGNOSIS — G40909 Epilepsy, unspecified, not intractable, without status epilepticus: Secondary | ICD-10-CM | POA: Diagnosis present

## 2021-05-19 DIAGNOSIS — E874 Mixed disorder of acid-base balance: Secondary | ICD-10-CM | POA: Diagnosis present

## 2021-05-19 DIAGNOSIS — F1011 Alcohol abuse, in remission: Secondary | ICD-10-CM | POA: Diagnosis present

## 2021-05-19 DIAGNOSIS — J690 Pneumonitis due to inhalation of food and vomit: Secondary | ICD-10-CM | POA: Diagnosis not present

## 2021-05-19 DIAGNOSIS — R188 Other ascites: Secondary | ICD-10-CM

## 2021-05-19 DIAGNOSIS — Z531 Procedure and treatment not carried out because of patient's decision for reasons of belief and group pressure: Secondary | ICD-10-CM | POA: Diagnosis present

## 2021-05-19 DIAGNOSIS — F419 Anxiety disorder, unspecified: Secondary | ICD-10-CM | POA: Diagnosis present

## 2021-05-19 DIAGNOSIS — Z818 Family history of other mental and behavioral disorders: Secondary | ICD-10-CM

## 2021-05-19 DIAGNOSIS — E162 Hypoglycemia, unspecified: Secondary | ICD-10-CM | POA: Diagnosis not present

## 2021-05-19 DIAGNOSIS — F319 Bipolar disorder, unspecified: Secondary | ICD-10-CM | POA: Diagnosis present

## 2021-05-19 DIAGNOSIS — Z0189 Encounter for other specified special examinations: Secondary | ICD-10-CM

## 2021-05-19 DIAGNOSIS — K767 Hepatorenal syndrome: Secondary | ICD-10-CM | POA: Diagnosis not present

## 2021-05-19 DIAGNOSIS — Z96642 Presence of left artificial hip joint: Secondary | ICD-10-CM | POA: Diagnosis present

## 2021-05-19 DIAGNOSIS — Z20822 Contact with and (suspected) exposure to covid-19: Secondary | ICD-10-CM | POA: Diagnosis present

## 2021-05-19 DIAGNOSIS — R571 Hypovolemic shock: Secondary | ICD-10-CM | POA: Diagnosis present

## 2021-05-19 DIAGNOSIS — Z515 Encounter for palliative care: Secondary | ICD-10-CM | POA: Diagnosis not present

## 2021-05-19 DIAGNOSIS — E440 Moderate protein-calorie malnutrition: Secondary | ICD-10-CM | POA: Diagnosis present

## 2021-05-19 DIAGNOSIS — Z66 Do not resuscitate: Secondary | ICD-10-CM | POA: Diagnosis not present

## 2021-05-19 DIAGNOSIS — Z01818 Encounter for other preprocedural examination: Secondary | ICD-10-CM

## 2021-05-19 DIAGNOSIS — G9341 Metabolic encephalopathy: Secondary | ICD-10-CM | POA: Diagnosis not present

## 2021-05-19 DIAGNOSIS — Z79899 Other long term (current) drug therapy: Secondary | ICD-10-CM

## 2021-05-19 DIAGNOSIS — J392 Other diseases of pharynx: Secondary | ICD-10-CM | POA: Diagnosis not present

## 2021-05-19 DIAGNOSIS — E875 Hyperkalemia: Secondary | ICD-10-CM | POA: Diagnosis not present

## 2021-05-19 DIAGNOSIS — Z96651 Presence of right artificial knee joint: Secondary | ICD-10-CM | POA: Diagnosis present

## 2021-05-19 DIAGNOSIS — Z8719 Personal history of other diseases of the digestive system: Secondary | ICD-10-CM | POA: Diagnosis not present

## 2021-05-19 DIAGNOSIS — R578 Other shock: Secondary | ICD-10-CM | POA: Diagnosis not present

## 2021-05-19 DIAGNOSIS — D62 Acute posthemorrhagic anemia: Secondary | ICD-10-CM | POA: Diagnosis not present

## 2021-05-19 DIAGNOSIS — K769 Liver disease, unspecified: Secondary | ICD-10-CM | POA: Diagnosis present

## 2021-05-19 DIAGNOSIS — Z781 Physical restraint status: Secondary | ICD-10-CM

## 2021-05-19 DIAGNOSIS — K7682 Hepatic encephalopathy: Secondary | ICD-10-CM | POA: Diagnosis present

## 2021-05-19 DIAGNOSIS — F17210 Nicotine dependence, cigarettes, uncomplicated: Secondary | ICD-10-CM | POA: Diagnosis present

## 2021-05-19 LAB — CBC WITH DIFFERENTIAL/PLATELET
Abs Immature Granulocytes: 0.29 10*3/uL — ABNORMAL HIGH (ref 0.00–0.07)
Basophils Absolute: 0 10*3/uL (ref 0.0–0.1)
Basophils Relative: 0 %
Eosinophils Absolute: 0 10*3/uL (ref 0.0–0.5)
Eosinophils Relative: 0 %
HCT: 33.4 % — ABNORMAL LOW (ref 39.0–52.0)
Hemoglobin: 11.4 g/dL — ABNORMAL LOW (ref 13.0–17.0)
Immature Granulocytes: 2 %
Lymphocytes Relative: 3 %
Lymphs Abs: 0.5 10*3/uL — ABNORMAL LOW (ref 0.7–4.0)
MCH: 36 pg — ABNORMAL HIGH (ref 26.0–34.0)
MCHC: 34.1 g/dL (ref 30.0–36.0)
MCV: 105.4 fL — ABNORMAL HIGH (ref 80.0–100.0)
Monocytes Absolute: 2.8 10*3/uL — ABNORMAL HIGH (ref 0.1–1.0)
Monocytes Relative: 16 %
Neutro Abs: 13.7 10*3/uL — ABNORMAL HIGH (ref 1.7–7.7)
Neutrophils Relative %: 79 %
Platelets: UNDETERMINED 10*3/uL (ref 150–400)
RBC: 3.17 MIL/uL — ABNORMAL LOW (ref 4.22–5.81)
RDW: 17.3 % — ABNORMAL HIGH (ref 11.5–15.5)
WBC: 17.3 10*3/uL — ABNORMAL HIGH (ref 4.0–10.5)
nRBC: 0 % (ref 0.0–0.2)

## 2021-05-19 LAB — LACTIC ACID, PLASMA
Lactic Acid, Venous: 5.3 mmol/L (ref 0.5–1.9)
Lactic Acid, Venous: 4.6 mmol/L (ref 0.5–1.9)

## 2021-05-19 LAB — COMPREHENSIVE METABOLIC PANEL
ALT: 91 U/L — ABNORMAL HIGH (ref 0–44)
AST: 122 U/L — ABNORMAL HIGH (ref 15–41)
Albumin: 2 g/dL — ABNORMAL LOW (ref 3.5–5.0)
Alkaline Phosphatase: 215 U/L — ABNORMAL HIGH (ref 38–126)
Anion gap: 12 (ref 5–15)
BUN: 20 mg/dL (ref 6–20)
CO2: 17 mmol/L — ABNORMAL LOW (ref 22–32)
Calcium: 8.7 mg/dL — ABNORMAL LOW (ref 8.9–10.3)
Chloride: 95 mmol/L — ABNORMAL LOW (ref 98–111)
Creatinine, Ser: 1.38 mg/dL — ABNORMAL HIGH (ref 0.61–1.24)
GFR, Estimated: 60 mL/min — ABNORMAL LOW (ref >60)
Glucose, Bld: 83 mg/dL (ref 70–99)
Potassium: 3.8 mmol/L (ref 3.5–5.1)
Sodium: 124 mmol/L — ABNORMAL LOW (ref 135–145)
Total Bilirubin: 13.2 mg/dL — ABNORMAL HIGH (ref 0.3–1.2)
Total Protein: 5.4 g/dL — ABNORMAL LOW (ref 6.5–8.1)

## 2021-05-19 LAB — I-STAT VENOUS BLOOD GAS, ED
Acid-base deficit: 4 mmol/L — ABNORMAL HIGH (ref 0.0–2.0)
Bicarbonate: 20.4 mmol/L (ref 20.0–28.0)
Calcium, Ion: 1.03 mmol/L — ABNORMAL LOW (ref 1.15–1.40)
HCT: 35 % — ABNORMAL LOW (ref 39.0–52.0)
Hemoglobin: 11.9 g/dL — ABNORMAL LOW (ref 13.0–17.0)
O2 Saturation: 99 %
Potassium: 4.6 mmol/L (ref 3.5–5.1)
Sodium: 123 mmol/L — ABNORMAL LOW (ref 135–145)
TCO2: 21 mmol/L — ABNORMAL LOW (ref 22–32)
pCO2, Ven: 33 mmHg — ABNORMAL LOW (ref 44.0–60.0)
pH, Ven: 7.399 (ref 7.250–7.430)
pO2, Ven: 164 mmHg — ABNORMAL HIGH (ref 32.0–45.0)

## 2021-05-19 LAB — URINALYSIS, ROUTINE W REFLEX MICROSCOPIC
Glucose, UA: NEGATIVE mg/dL
Hgb urine dipstick: NEGATIVE
Ketones, ur: NEGATIVE mg/dL
Leukocytes,Ua: NEGATIVE
Nitrite: NEGATIVE
Protein, ur: NEGATIVE mg/dL
Specific Gravity, Urine: 1.014 (ref 1.005–1.030)
pH: 5 (ref 5.0–8.0)

## 2021-05-19 LAB — LIPASE, BLOOD: Lipase: 70 U/L — ABNORMAL HIGH (ref 11–51)

## 2021-05-19 LAB — ETHANOL: Alcohol, Ethyl (B): <10 mg/dL (ref <10)

## 2021-05-19 LAB — CBG MONITORING, ED: Glucose-Capillary: 79 mg/dL (ref 70–99)

## 2021-05-19 LAB — RESP PANEL BY RT-PCR (FLU A&B, COVID) ARPGX2
Influenza A by PCR: NEGATIVE
Influenza B by PCR: NEGATIVE
SARS Coronavirus 2 by RT PCR: NEGATIVE

## 2021-05-19 LAB — CK: Total CK: 403 U/L — ABNORMAL HIGH (ref 49–397)

## 2021-05-19 LAB — AMMONIA: Ammonia: 71 umol/L — ABNORMAL HIGH (ref 9–35)

## 2021-05-19 MED ORDER — RIFAXIMIN 550 MG PO TABS
550.0000 mg | ORAL_TABLET | Freq: Once | ORAL | Status: AC
  Administered 2021-05-19: 550 mg via ORAL
  Filled 2021-05-19: qty 1

## 2021-05-19 MED ORDER — METRONIDAZOLE 500 MG/100ML IV SOLN
500.0000 mg | Freq: Once | INTRAVENOUS | Status: AC
  Administered 2021-05-19: 500 mg via INTRAVENOUS
  Filled 2021-05-19: qty 100

## 2021-05-19 MED ORDER — LACTATED RINGERS IV SOLN: INTRAVENOUS | Status: DC

## 2021-05-19 MED ORDER — LACTULOSE 10 GM/15ML PO SOLN: 30.0000 g | Freq: Three times a day (TID) | ORAL | Status: DC

## 2021-05-19 MED ORDER — SODIUM CHLORIDE 0.9 % IV SOLN
2.0000 g | Freq: Once | INTRAVENOUS | Status: AC
  Administered 2021-05-19: 2 g via INTRAVENOUS
  Filled 2021-05-19: qty 2

## 2021-05-19 MED ORDER — RIFAXIMIN 550 MG PO TABS
550.0000 mg | ORAL_TABLET | Freq: Two times a day (BID) | ORAL | Status: DC
  Filled 2021-05-19: qty 1

## 2021-05-19 MED ORDER — LACTULOSE 10 GM/15ML PO SOLN
30.0000 g | Freq: Once | ORAL | Status: AC
  Administered 2021-05-19: 30 g via ORAL
  Filled 2021-05-19: qty 60

## 2021-05-19 MED ORDER — LEVETIRACETAM 500 MG PO TABS
1500.0000 mg | ORAL_TABLET | Freq: Two times a day (BID) | ORAL | Status: DC
  Administered 2021-05-19: 1500 mg via ORAL
  Filled 2021-05-19: qty 3

## 2021-05-19 MED ORDER — SODIUM CHLORIDE 0.9 % IV BOLUS (SEPSIS)
1000.0000 mL | Freq: Once | INTRAVENOUS | Status: AC
  Administered 2021-05-19: 1000 mL via INTRAVENOUS

## 2021-05-19 MED ORDER — SODIUM CHLORIDE 0.9 % IV SOLN: 2.0000 g | Freq: Once | INTRAVENOUS | Status: DC

## 2021-05-19 MED ORDER — SODIUM CHLORIDE 0.9 % IV BOLUS
1000.0000 mL | Freq: Once | INTRAVENOUS | Status: AC
  Administered 2021-05-19: 1000 mL via INTRAVENOUS

## 2021-05-19 MED ORDER — SODIUM CHLORIDE 0.9 % IV SOLN
2.0000 g | Freq: Three times a day (TID) | INTRAVENOUS | Status: DC
  Administered 2021-05-20: 2 g via INTRAVENOUS
  Filled 2021-05-19: qty 2

## 2021-05-19 MED ORDER — ONDANSETRON HCL 4 MG/2ML IJ SOLN
4.0000 mg | Freq: Once | INTRAMUSCULAR | Status: AC
  Administered 2021-05-19: 4 mg via INTRAVENOUS
  Filled 2021-05-19: qty 2

## 2021-05-19 MED ORDER — MORPHINE SULFATE (PF) 2 MG/ML IV SOLN
2.0000 mg | Freq: Once | INTRAVENOUS | Status: AC
Start: 2021-05-19 — End: 2021-05-19
  Administered 2021-05-19: 2 mg via INTRAVENOUS
  Filled 2021-05-19: qty 1

## 2021-05-19 NOTE — Sepsis Progress Note (Signed)
Monitoring for code sepsis protocol. 

## 2021-05-19 NOTE — ED Notes (Signed)
Warm blankets applied

## 2021-05-19 NOTE — ED Provider Notes (Signed)
[facility] EMERGENCY DEPARTMENT Provider Note   CSN: [id number] Arrival date & time: 05/18/2021  1614     History Chief Complaint  Patient presents with   Altered Mental Status    [name] is a 55 y.o. male.  55 yo M with a chief complaints of altered mental status.  Patient was called out by EMS who found him in his closet.  Last seen last night by his roommate.  Patient has a history of cirrhosis and feels like his ammonia level is elevated.  Level 5 caveat altered mental status.  Patient tells me he hurts everywhere.  Unsure if he is fallen.  The history is provided by the patient.  Altered Mental Status Presenting symptoms: confusion   Severity:  Moderate Most recent episode:  Yesterday Episode history:  Continuous Duration:  2 days Timing:  Constant Progression:  Worsening Chronicity:  Recurrent Associated symptoms: no abdominal pain, no fever, no headaches, no palpitations, no rash and no vomiting       Past Medical History:  Diagnosis Date   Abuse, drug or alcohol ([place])    Anemia    Bipolar affective disorder ([place])    [place])    liver cancer treated with micrablation   Chronic liver disease and cirrhosis    Depression    DJD (degenerative joint disease)    right knee   Dyslipidemia    Encephalopathy    Esophageal varices ([place])    Hernia    History of alcohol abuse    PONV (postoperative nausea and vomiting)    Seizures ([place])    Dr Jannifer Franklin   Thrombocytopenia ([place])    Wilson disease    Wrist fracture    right    Patient Active Problem List   Diagnosis Date Noted   Acute hepatic encephalopathy 05/27/2021   Severe sepsis ([place]) 05/18/2021   Hyperammonemia ([place])    Tobacco dependence 05/09/2021   Left displaced femoral neck fracture (HCC)    Closed fracture of left distal radius and ulna, initial encounter    Closed left hip fracture ([place]) 07/22/2019   Left wrist fracture 07/22/2019    Rib fracture 07/22/2019   Ascites [id number]   Alcoholic cirrhosis of liver with ascites ([place]) 07/19/2018   Left hydrocele 0000000   Umbilical hernia without obstruction and without gangrene 07/19/2018   Displaced fracture of distal end of right radius 12/23/2016   Possible exposure to STD 06/07/2016   S/P total knee replacement using cement 07/15/2015   Bipolar affective disorder ([place]) 05/27/2015   Depression 05/27/2015   Thrombocytopenia ([place]) 05/27/2015   History of alcohol abuse 05/27/2015   Seizure ([place]) 05/27/2015   Brain disorder 05/27/2015   Chest pain 04/30/2015   Hepatocellular carcinoma ([place]) 02/15/2015   Left inguinal hernia 10/22/2014   Hyponatremia 05/06/2014   Diarrhea 04/30/2014   Wilson's disease 09/08/2013   UTI (urinary tract infection) 07/29/2013   Fracture of tibial plateau 07/24/2013   ED (erectile dysfunction) of organic origin 01/07/2013   Hypokalemia 10/21/2011   Eczema 10/21/2011   Impaired glucose tolerance 10/19/2011   Hepatic encephalopathy ([place]) 08/05/2011   Disorder of stomach and duodenum 08/03/2011   Esophageal varices ([place]) 08/03/2011   Portal hypertension ([place]) 08/03/2011   Seizures ([place])    Encephalopathy    Preventative health care 04/14/2011   Follow-up examination following surgery 02/23/2011   Chronic liver disease and cirrhosis ([place]) 06/10/2009    Past Surgical History:  Procedure Laterality Date   ESOPHAGOGASTRODUODENOSCOPY     HARDWARE REMOVAL Right 01/22/2015   Procedure: HARDWARE REMOVAL RIGHT LEG;  Surgeon: Leandrew Koyanagi, MD;  Location: [place];  Service: Orthopedics;  Laterality: Right;   IR PARACENTESIS  10/04/2018   IR PARACENTESIS  04/29/2019   IR PARACENTESIS  05/10/2021   left inguinal hernia  june 2012   [place]   liver cancer  02/2015   ablation   microablation of liver     OPEN REDUCTION INTERNAL FIXATION (ORIF) DISTAL RADIAL FRACTURE Right 12/21/2016   Procedure: OPEN REDUCTION INTERNAL FIXATION (ORIF) RIGHT  DISTAL RADIUS FRACTURE;  Surgeon: Leandrew Koyanagi, MD;  Location: [place];  Service: Orthopedics;  Laterality: Right;   OPEN REDUCTION INTERNAL FIXATION (ORIF) TIBIA/FIBULA FRACTURE Right 07/25/2013   Procedure: OPEN REDUCTION INTERNAL FIXATION (ORIF) RIGHT TIBIAL PLATEAU, TIBIAL SHAFT AND FIBULA FRACTURES, POSSIBLE FASCIOTOMIES;  Surgeon: Marianna Payment, MD;  Location: [place];  Service: Orthopedics;  Laterality: Right;   ORIF WRIST FRACTURE Left 07/23/2019   Procedure: OPEN REDUCTION INTERNAL FIXATION (ORIF) WRIST FRACTURE;  Surgeon: Leandrew Koyanagi, MD;  Location: [place];  Service: Orthopedics;  Laterality: Left;   TOTAL HIP ARTHROPLASTY Left 07/23/2019   Procedure: TOTAL HIP ARTHROPLASTY ANTERIOR APPROACH;  Surgeon: Leandrew Koyanagi, MD;  Location: [place];  Service: Orthopedics;  Laterality: Left;   TOTAL KNEE ARTHROPLASTY Right 07/15/2015   Procedure: RIGHT TOTAL KNEE ARTHROPLASTY;  Surgeon: Leandrew Koyanagi, MD;  Location: [place];  Service: Orthopedics;  Laterality: Right;   VASCULAR SURGERY         Family History  Problem Relation Age of Onset   Heart disease Other    Diabetes Father    Dementia Mother    Glaucoma Brother     Social History   Tobacco Use   Smoking status: Every Day    Packs/day: 0.50    Types: Cigarettes   Smokeless tobacco: Never  Vaping Use   Vaping Use: Never used  Substance Use Topics   Alcohol use: No    Alcohol/week: 0.0 standard drinks    Comment: 3-4 bottles a week, quit drinking 2005   Drug use: No    Comment: last use 2005    Home Medications Prior to Admission medications   Medication Sig Start Date End Date Taking? Authorizing Provider  acetaminophen (TYLENOL) 500 MG tablet Take 1,500 mg by mouth every 6 (six) hours as needed (pain).   Yes [provider]  baclofen (LIORESAL) 10 MG tablet Take 10 mg by mouth at bedtime.   Yes [provider]  Cholecalciferol (VITAMIN D-3) 125 MCG (5000 UT) TABS Take 5,000 Units by mouth every morning.    Yes [provider]  diphenhydrAMINE (BENADRYL) 25 MG tablet Take 50 mg by mouth at bedtime.   Yes [provider]  furosemide (LASIX) 40 MG tablet Take 2 tablets (80 mg total) by mouth 2 (two) times daily. Patient taking differently: Take 40-120 mg by mouth See admin instructions. Take 3 tablets by mouth in the morning and then take two tablets at lunch per patient. 04/29/19  Yes Biagio Borg, MD  lactulose (CHRONULAC) 10 GM/15ML solution Take 30 mLs (20 g total) by mouth 3 (three) times daily. Patient taking differently: Take 10 g by mouth See admin instructions. Take 15 mls (10 gm) by mouth twice daily, may take a 3rd dose (10 gm) as needed for constipation 05/11/21  Yes Nicole Kindred A, DO  levETIRAcetam (KEPPRA)  500 MG tablet Take 3 tablets (1,500 mg total) by mouth 2 (two) times daily. 12/20/20  Yes Cameron Sprang, MD  Magnesium 500 MG TABS Take 500 mg by mouth every morning.   Yes [provider]  Neomycin-Bacitracin-Polymyxin (TRIPLE ANTIBIOTIC) OINT Apply 1 application topically daily.   Yes [provider]  oxyCODONE (OXY IR/ROXICODONE) 5 MG immediate release tablet Take 5 mg by mouth every 4 (four) hours as needed for pain. 05/03/21  Yes [provider]  potassium chloride (KLOR-CON) 10 MEQ tablet TAKE ONE TABLET BY MOUTH TWICE DAILY Patient taking differently: Take 20 mEq by mouth 2 (two) times daily. 01/27/20  Yes Biagio Borg, MD  simethicone (GAS-X) 80 MG chewable tablet Chew 80 mg by mouth every 6 (six) hours as needed for flatulence.   Yes [provider]  spironolactone (ALDACTONE) 100 MG tablet Take 2 tablets (200 mg total) by mouth every morning. Patient taking differently: Take 200 mg by mouth daily. 04/29/19  Yes Biagio Borg, MD  vitamin A 3 MG (10000 UNITS) capsule Take 10,000 Units by mouth daily.   Yes [provider]  Zinc 50 MG TABS Take 50 mg by mouth 3 (three) times daily.   Yes [provider]     Allergies    Codeine  Review of Systems   Review of Systems  Constitutional:  Negative for chills and fever.  HENT:  Negative for congestion and facial swelling.   Eyes:  Negative for discharge and visual disturbance.  Respiratory:  Negative for shortness of breath.   Cardiovascular:  Negative for chest pain and palpitations.  Gastrointestinal:  Negative for abdominal pain, diarrhea and vomiting.  Musculoskeletal:  Negative for arthralgias and myalgias.  Skin:  Negative for color change and rash.  Neurological:  Negative for tremors, syncope and headaches.  Psychiatric/Behavioral:  Positive for confusion. Negative for dysphoric mood.    Physical Exam Updated Vital Signs BP (!) 96/43   Pulse 100   Temp (!) 96.1 F (35.6 C) (Rectal)   Resp 16   Wt 88.5 kg   SpO2 95%   BMI 25.73 kg/m   Physical Exam Vitals and nursing note reviewed.  Constitutional:      Appearance: He is well-developed.     Comments: Icteric  HENT:     Head: Normocephalic and atraumatic.  Eyes:     Pupils: Pupils are equal, round, and reactive to light.  Neck:     Vascular: No JVD.  Cardiovascular:     Rate and Rhythm: Normal rate and regular rhythm.     Heart sounds: No murmur heard.   No friction rub. No gallop.  Pulmonary:     Effort: No respiratory distress.     Breath sounds: No wheezing.  Abdominal:     General: There is distension.     Tenderness: There is no abdominal tenderness. There is no guarding or rebound.     Comments: Distended abdomen with positive fluid wave.  No obvious abdominal tenderness.  Musculoskeletal:        General: Normal range of motion.     Cervical back: Normal range of motion and neck supple.  Skin:    Coloration: Skin is not pale.     Findings: No rash.  Neurological:     Mental Status: He is alert.     Comments: Confused about scenario.  Moves all 4 extremities spontaneously.  Psychiatric:        Behavior: Behavior normal.  ED Results / Procedures  / Treatments   Labs (all labs ordered are listed, but only abnormal results are displayed) Labs Reviewed  COMPREHENSIVE METABOLIC PANEL - Abnormal; Notable for the following components:      Result Value   Sodium 124 (*)    Chloride 95 (*)    CO2 17 (*)    Creatinine, Ser 1.38 (*)    Calcium 8.7 (*)    Total Protein 5.4 (*)    Albumin 2.0 (*)    AST 122 (*)    ALT 91 (*)    Alkaline Phosphatase 215 (*)    Total Bilirubin 13.2 (*)    GFR, Estimated 60 (*)    All other components within normal limits  LIPASE, BLOOD - Abnormal; Notable for the following components:   Lipase 70 (*)    All other components within normal limits  AMMONIA - Abnormal; Notable for the following components:   Ammonia 71 (*)    All other components within normal limits  LACTIC ACID, PLASMA - Abnormal; Notable for the following components:   Lactic Acid, Venous 5.3 (*)    All other components within normal limits  LACTIC ACID, PLASMA - Abnormal; Notable for the following components:   Lactic Acid, Venous 4.6 (*)    All other components within normal limits  CK - Abnormal; Notable for the following components:   Total CK 403 (*)    All other components within normal limits  URINALYSIS, ROUTINE W REFLEX MICROSCOPIC - Abnormal; Notable for the following components:   Color, Urine AMBER (*)    APPearance HAZY (*)    Bilirubin Urine SMALL (*)    All other components within normal limits  CBC WITH DIFFERENTIAL/PLATELET - Abnormal; Notable for the following components:   WBC 17.3 (*)    RBC 3.17 (*)    Hemoglobin 11.4 (*)    HCT 33.4 (*)    MCV 105.4 (*)    MCH 36.0 (*)    RDW 17.3 (*)    Neutro Abs 13.7 (*)    Lymphs Abs 0.5 (*)    Monocytes Absolute 2.8 (*)    Abs Immature Granulocytes 0.29 (*)    All other components within normal limits  I-STAT VENOUS BLOOD GAS, ED - Abnormal; Notable for the following components:   pCO2, Ven 33.0 (*)    pO2, Ven 164.0 (*)    TCO2 21 (*)    Acid-base deficit  4.0 (*)    Sodium 123 (*)    Calcium, Ion 1.03 (*)    HCT 35.0 (*)    Hemoglobin 11.9 (*)    All other components within normal limits  RESP PANEL BY RT-PCR (FLU A&B, COVID) [id number]  CULTURE, BLOOD (ROUTINE X 2)  CULTURE, BLOOD (ROUTINE X 2)  ETHANOL  CBC WITH DIFFERENTIAL/PLATELET  CBC  AMMONIA  COMPREHENSIVE METABOLIC PANEL  APTT  PROTIME-INR  CK  LACTIC ACID, PLASMA  CBG MONITORING, ED    EKG EKG Interpretation  Date/Time:  Thursday May 19 2021 16:22:46 EDT Ventricular Rate:  86 PR Interval:  176 QRS Duration: 82 QT Interval:  388 QTC Calculation: 465 R Axis:   48 Text Interpretation: Sinus rhythm No significant change since last tracing Confirmed by Deno Etienne [phone]) on 05/14/2021 4:54:03 PM  Radiology CT HEAD WO CONTRAST (5MM)  Result Date: 05/10/2021 CLINICAL DATA:  Delirium; Neck trauma, intoxicated or obtunded (Age >= 16y) EXAM: CT HEAD WITHOUT CONTRAST CT CERVICAL SPINE WITHOUT CONTRAST TECHNIQUE: Multidetector CT imaging of  the head and cervical spine was performed following the standard protocol without intravenous contrast. Multiplanar CT image reconstructions of the cervical spine were also generated. COMPARISON:  July 21, 2019. FINDINGS: CT HEAD FINDINGS Brain: No evidence of acute infarction, hemorrhage, hydrocephalus, extra-axial collection or mass lesion/mass effect. Chronic similar brainstem atrophy. Mild patchy white matter hypoattenuation, nonspecific but compatible with chronic microvascular ischemic disease. Vascular: No hyperdense vessel identified. Skull: No acute fracture. Sinuses/Orbits: Visualized sinuses are clear. No acute orbital findings. Other: No mastoid effusions. CT CERVICAL SPINE FINDINGS Alignment: Similar slight anterolisthesis of C4 on C5 and C7 on T1. Otherwise, no substantial sagittal subluxation. Rotation of C1 on C2. Skull base and vertebrae: Similar vertebral body heights. Evidence of acute fracture. Soft tissues and spinal  canal: No prevertebral fluid or swelling. No visible canal hematoma. Disc levels: Severe facet arthropathy on the left at C2-C3 and C3-C4. Severe degenerative disease at C3-C4 and moderate to severe degenerative disc disease at C5-C6 and C6-C7. No evidence of high-grade bony canal stenosis. Upper chest: Clear visualized lung apices. IMPRESSION: CT Head: 1. No evidence of acute intracranial abnormality. 2. Similar chronic findings. CT Cervical Spine: 1. No evidence of acute fracture. 2. Rotation of C1 on C2, likely positional in the absence of a fixed torticollis. 3. Multilevel severe degenerative change, as detailed above. Electronically Signed   By: Margaretha Sheffield M.D.   On: 05/18/2021 17:58   CT Cervical Spine Wo Contrast  Result Date: 05/21/2021 CLINICAL DATA:  Delirium; Neck trauma, intoxicated or obtunded (Age >= 16y) EXAM: CT HEAD WITHOUT CONTRAST CT CERVICAL SPINE WITHOUT CONTRAST TECHNIQUE: Multidetector CT imaging of the head and cervical spine was performed following the standard protocol without intravenous contrast. Multiplanar CT image reconstructions of the cervical spine were also generated. COMPARISON:  July 21, 2019. FINDINGS: CT HEAD FINDINGS Brain: No evidence of acute infarction, hemorrhage, hydrocephalus, extra-axial collection or mass lesion/mass effect. Chronic similar brainstem atrophy. Mild patchy white matter hypoattenuation, nonspecific but compatible with chronic microvascular ischemic disease. Vascular: No hyperdense vessel identified. Skull: No acute fracture. Sinuses/Orbits: Visualized sinuses are clear. No acute orbital findings. Other: No mastoid effusions. CT CERVICAL SPINE FINDINGS Alignment: Similar slight anterolisthesis of C4 on C5 and C7 on T1. Otherwise, no substantial sagittal subluxation. Rotation of C1 on C2. Skull base and vertebrae: Similar vertebral body heights. Evidence of acute fracture. Soft tissues and spinal canal: No prevertebral fluid or swelling. No  visible canal hematoma. Disc levels: Severe facet arthropathy on the left at C2-C3 and C3-C4. Severe degenerative disease at C3-C4 and moderate to severe degenerative disc disease at C5-C6 and C6-C7. No evidence of high-grade bony canal stenosis. Upper chest: Clear visualized lung apices. IMPRESSION: CT Head: 1. No evidence of acute intracranial abnormality. 2. Similar chronic findings. CT Cervical Spine: 1. No evidence of acute fracture. 2. Rotation of C1 on C2, likely positional in the absence of a fixed torticollis. 3. Multilevel severe degenerative change, as detailed above. Electronically Signed   By: Margaretha Sheffield M.D.   On: 05/15/2021 17:58    Procedures Procedures   Medications Ordered in ED Medications  ceFEPIme (MAXIPIME) 2 g in sodium chloride 0.9 % 100 mL IVPB (has no administration in time range)  levETIRAcetam (KEPPRA) tablet 1,500 mg (1,500 mg Oral Given 05/18/2021 2224)  rifaximin (XIFAXAN) tablet 550 mg (has no administration in time range)  lactulose (CHRONULAC) 10 GM/15ML solution 30 g (has no administration in time range)  sodium chloride 0.9 % bolus 1,000 mL (0 mLs Intravenous  Stopped 05/06/2021 1817)  sodium chloride 0.9 % bolus 1,000 mL (0 mLs Intravenous Stopped 05/10/2021 1842)  sodium chloride 0.9 % bolus 1,000 mL (0 mLs Intravenous Stopped 05/16/2021 2143)  ceFEPIme (MAXIPIME) 2 g in sodium chloride 0.9 % 100 mL IVPB (0 g Intravenous Stopped 05/27/2021 2013)  metroNIDAZOLE (FLAGYL) IVPB 500 mg (0 mg Intravenous Stopped 06/03/2021 2143)  lactulose (CHRONULAC) 10 GM/15ML solution 30 g (30 g Oral Given 06/01/2021 2109)  morphine 2 MG/ML injection 2 mg (2 mg Intravenous Given 05/26/2021 2109)  ondansetron (ZOFRAN) injection 4 mg (4 mg Intravenous Given 05/18/2021 2109)  rifaximin (XIFAXAN) tablet 550 mg (550 mg Oral Given 06/03/2021 2224)    ED Course  I have reviewed the triage vital signs and the nursing notes.  Pertinent labs & imaging results that were available during my care of the  patient were reviewed by me and considered in my medical decision making (see chart for details).    MDM Rules/Calculators/A&P                           55 yo M with a chief complaints of altered mental status.  Patient has a history of cirrhosis he feels like he is having a problem with his ammonia level.  We will obtain a laboratory evaluation.  Possible fall we will obtain a CT scan of the head C-spine.  Bolus of IV fluids.  Reassess.  Some delay on blood work due to hemolysis.  CMP with mild worsening of his LFTs and total bilirubin has elevated.  VBG without hypercarbia.  Lactate has resulted and is 5.2.  Patient's blood pressure is also been a bit low especially due to the diastolic number.  We will call code sepsis.  Broad-spectrum antibiotics.  Lactate with some improvement down to 4.6.  Patient's ammonia level is elevated in the 70s.  Labs still having some difficulty getting his CBC to results.  CBC has resulted.  Leukocytosis.  Will discuss with medicine.  CRITICAL CARE Performed by: Cecilio Asper   Total critical care time: 80 minutes  Critical care time was exclusive of separately billable procedures and treating other patients.  Critical care was necessary to treat or prevent imminent or life-threatening deterioration.  Critical care was time spent personally by me on the following activities: development of treatment plan with patient and/or surrogate as well as nursing, discussions with consultants, evaluation of patient's response to treatment, examination of patient, obtaining history from patient or surrogate, ordering and performing treatments and interventions, ordering and review of laboratory studies, ordering and review of radiographic studies, pulse oximetry and re-evaluation of patient's condition.  The patients results and plan were reviewed and discussed.   Any x-rays performed were independently reviewed by myself.   Differential diagnosis were  considered with the presenting HPI.  Medications  ceFEPIme (MAXIPIME) 2 g in sodium chloride 0.9 % 100 mL IVPB (has no administration in time range)  levETIRAcetam (KEPPRA) tablet 1,500 mg (1,500 mg Oral Given 05/29/2021 2224)  rifaximin (XIFAXAN) tablet 550 mg (has no administration in time range)  lactulose (CHRONULAC) 10 GM/15ML solution 30 g (has no administration in time range)  sodium chloride 0.9 % bolus 1,000 mL (0 mLs Intravenous Stopped 05/11/2021 1817)  sodium chloride 0.9 % bolus 1,000 mL (0 mLs Intravenous Stopped 05/10/2021 1842)  sodium chloride 0.9 % bolus 1,000 mL (0 mLs Intravenous Stopped 05/25/2021 2143)  ceFEPIme (MAXIPIME) 2 g in sodium chloride 0.9 % 100  mL IVPB (0 g Intravenous Stopped 05/21/2021 2013)  metroNIDAZOLE (FLAGYL) IVPB 500 mg (0 mg Intravenous Stopped 05/27/2021 2143)  lactulose (CHRONULAC) 10 GM/15ML solution 30 g (30 g Oral Given 05/23/2021 2109)  morphine 2 MG/ML injection 2 mg (2 mg Intravenous Given 05/22/2021 2109)  ondansetron (ZOFRAN) injection 4 mg (4 mg Intravenous Given 05/11/2021 2109)  rifaximin (XIFAXAN) tablet 550 mg (550 mg Oral Given 05/30/2021 2224)    Vitals:   05/11/2021 1930 05/09/2021 1955 05/07/2021 1955 05/12/2021 2047  BP: (!) 124/52 (!) 115/45  (!) 96/43  Pulse: 99 97  100  Resp: (!) 22 (!) 28  16  Temp:      TempSrc:      SpO2: 94% 96%  95%  Weight:   88.5 kg     Final diagnoses:  Hepatic encephalopathy (HCC)  Lactic acidosis  Hypovolemic shock (HCC)    Admission/ observation were discussed with the admitting physician, patient and/or family and they are comfortable with the plan.   Final Clinical Impression(s) / ED Diagnoses Final diagnoses:  Hepatic encephalopathy ([place])  Lactic acidosis  Hypovolemic shock [facility])    Rx / DC Orders ED Discharge Orders     None        Deno Etienne, DO 05/13/2021 2234

## 2021-05-19 NOTE — ED Triage Notes (Addendum)
Pt coming from home.Today found face-down in closet. AMS and GCS 8 initially. Shallow respirations. Responding to pain on scene. GCS improves enroute to 13. Fluids and zofran given by EMS. Family and Pt report ammonia levels chronically high. Possible 'seizure activity' reported by family on scene-No details available. LSN @ 2100 9/14.

## 2021-05-19 NOTE — Sepsis Progress Note (Signed)
Verified with bedside nurse via secure chat that one set of blood cultures were drawn prior to antibiotics.  There is an order written not to delay antibiotics if blood cultures could not be obtained.

## 2021-05-19 NOTE — Progress Notes (Signed)
Pharmacy Antibiotic Note  [name] is a 55 y.o. male admitted on 05/25/2021 with  intra-abdominal infection .  Pharmacy has been consulted for cefepime dosing.  WBC 17.3, afebrile SCr 1.38 - baseline ~0.7-0.8  Plan: Cefepime 2gm every 8 hours Monitor renal function, CBC, cultures/sensitivities and de-escalate as able  Temp (24hrs), Avg:96.1 F (35.6 C), Min:96.1 F (35.6 C), Max:96.1 F (35.6 C)  Recent Labs  Lab 05/13/21 1235 05/14/21 0207 05/15/21 0329 05/16/21 0243 05/17/21 0452 05/08/2021 1623 05/21/2021 1653  WBC 10.7* 8.0 9.1  --  10.5  --   --   CREATININE 0.72 0.84 0.71 0.71 0.59*  --  1.38*  LATICACIDVEN  --   --   --   --   --  5.3*  --     Estimated Creatinine Clearance: 66.7 mL/min (A) (by C-G formula based on SCr of 1.38 mg/dL (H)).    Allergies  Allergen Reactions   Codeine Nausea And Vomiting and Other (See Comments)    Reports it makes me crazy.     Antimicrobials this admission: Cefepime 9/15 >> Flagyl 9/15 x1 Rifaximin 9/15 x1  Dose adjustments this admission: none  Microbiology results: 9/15 BCx: pending  Thank you for allowing pharmacy to be a part of this patient's care.  Laurey Arrow, PharmD PGY1 Pharmacy Resident 05/17/2021  7:38 PM  Please check [email or web address] for unit-specific pharmacy phone numbers.

## 2021-05-19 NOTE — H&P (Signed)
History and Physical    [name] TMH:[id number] DOB: 1965/10/16 DOA: 05/18/2021  PCP: Cher Nakai, MD Patient coming from: Home  Chief Complaint: Altered mental status  HPI: [name] is a 55 y.o. male with medical history significant of cirrhosis secondary to alcohol abuse, hepatic encephalopathy, esophageal varices, chronic hyponatremia, chronic thrombocytopenia, hepatocellular carcinoma status post TACE, seizure disorder, bipolar disorder.  He was discharged from the hospital 2 days ago after being treated for hepatic encephalopathy.  He presents to the ED today via EMS for evaluation of altered mental status and GCS 8.  Patient was found facedown in his closet with shallow respirations.  He was responding to pain.  GCS improved to 13 in route.  Fluids and Zofran administered by EMS.  In the ED, patient was hypothermic with temperature 96.1 F, slightly tachycardic, and hypotensive with systolic in the 92J.  Labs showing WBC 17.3, hemoglobin 11.4 (stable), platelet count could not be estimated due to clumps on smear.  Sodium 124 (chronically low), potassium 3.8, chloride 95, bicarb 17, anion gap 12, BUN 20, creatinine 1.3 (baseline 0.5-0.7), glucose 83, calcium 8.7, total protein 5.4, albumin 2.0.  AST 122, ALT 91, alk phos 215, T bili 13.2.  Transaminases and alk phos chronically elevated, T bili worse since labs done 3 days ago.  Lipase 70, slightly elevated on recent labs as well.  Lactic acid 5.3 >4.6.  COVID and influenza PCR negative.  Ammonia 71, worse compared to labs done at the time of discharge 2 days ago.  CK borderline elevated at 403.  Blood gas with pH 7.39.  Blood ethanol level undetectable.  UA without signs of infection.  Blood culture x2 pending.  CT head and C-spine negative for acute finding. Medications administered include lactulose, morphine, Zofran, rifaximin, cefepime, metronidazole, and 3 L normal saline boluses.   Patient states he has been taking lactulose  20 g 3 times a day since he left the hospital 2 days ago.  He had 3 bowel movements yesterday and 2 bowel movements today.  He missed his afternoon dose of lactulose today.  States something is wrong with his brain and he thinks it is due to his ammonia level being too high.  Also endorsing abdominal pain and distention.  States he had paracentesis done recently but thinks his abdominal distention is worse now.  He thinks he had a seizure last night and states he spoke to his roommate who also felt that he had a seizure.  Also reports vomiting last night.  No additional history could be obtained.  Review of Systems:  All systems reviewed and apart from history of presenting illness, are negative.  Past Medical History:  Diagnosis Date   Abuse, drug or alcohol ([place])    Anemia    Bipolar affective disorder ([place])    [facility])    liver cancer treated with micrablation   Chronic liver disease and cirrhosis    Depression    DJD (degenerative joint disease)    right knee   Dyslipidemia    Encephalopathy    Esophageal varices (H[facility])    Hernia    History of alcohol abuse    PONV (postoperative nausea and vomiting)    Seizures (H[facility])    Dr Jannifer Franklin   Thrombocytopenia ([place])    Wilson disease    Wrist fracture    right    Past Surgical History:  Procedure Laterality Date   ESOPHAGOGASTRODUODENOSCOPY  HARDWARE REMOVAL Right 01/22/2015   Procedure: HARDWARE REMOVAL RIGHT LEG;  Surgeon: Leandrew Koyanagi, MD;  Location: [place];  Service: Orthopedics;  Laterality: Right;   IR PARACENTESIS  10/04/2018   IR PARACENTESIS  04/29/2019   IR PARACENTESIS  05/10/2021   left inguinal hernia  june 2012   [place]   liver cancer  02/2015   ablation   microablation of liver     OPEN REDUCTION INTERNAL FIXATION (ORIF) DISTAL RADIAL FRACTURE Right 12/21/2016   Procedure: OPEN REDUCTION INTERNAL FIXATION (ORIF) RIGHT DISTAL RADIUS FRACTURE;  Surgeon: Leandrew Koyanagi, MD;   Location: [place];  Service: Orthopedics;  Laterality: Right;   OPEN REDUCTION INTERNAL FIXATION (ORIF) TIBIA/FIBULA FRACTURE Right 07/25/2013   Procedure: OPEN REDUCTION INTERNAL FIXATION (ORIF) RIGHT TIBIAL PLATEAU, TIBIAL SHAFT AND FIBULA FRACTURES, POSSIBLE FASCIOTOMIES;  Surgeon: Marianna Payment, MD;  Location: [place];  Service: Orthopedics;  Laterality: Right;   ORIF WRIST FRACTURE Left 07/23/2019   Procedure: OPEN REDUCTION INTERNAL FIXATION (ORIF) WRIST FRACTURE;  Surgeon: Leandrew Koyanagi, MD;  Location: [place];  Service: Orthopedics;  Laterality: Left;   TOTAL HIP ARTHROPLASTY Left 07/23/2019   Procedure: TOTAL HIP ARTHROPLASTY ANTERIOR APPROACH;  Surgeon: Leandrew Koyanagi, MD;  Location: [place];  Service: Orthopedics;  Laterality: Left;   TOTAL KNEE ARTHROPLASTY Right 07/15/2015   Procedure: RIGHT TOTAL KNEE ARTHROPLASTY;  Surgeon: Leandrew Koyanagi, MD;  Location: [place];  Service: Orthopedics;  Laterality: Right;   VASCULAR SURGERY       reports that he has been smoking cigarettes. He has been smoking an average of .5 packs per day. He has never used smokeless tobacco. He reports that he does not drink alcohol and does not use drugs.  Allergies  Allergen Reactions   Codeine Nausea And Vomiting and Other (See Comments)    Reports it makes me crazy.     Family History  Problem Relation Age of Onset   Heart disease Other    Diabetes Father    Dementia Mother    Glaucoma Brother     Prior to Admission medications   Medication Sig Start Date End Date Taking? Authorizing Provider  acetaminophen (TYLENOL) 500 MG tablet Take 1,500 mg by mouth every 6 (six) hours as needed (pain).   Yes [provider]  baclofen (LIORESAL) 10 MG tablet Take 10 mg by mouth at bedtime.   Yes [provider]  Cholecalciferol (VITAMIN D-3) 125 MCG (5000 UT) TABS Take 5,000 Units by mouth every morning.   Yes [provider]  diphenhydrAMINE (BENADRYL) 25 MG tablet Take 50 mg by mouth at  bedtime.   Yes [provider]  furosemide (LASIX) 40 MG tablet Take 2 tablets (80 mg total) by mouth 2 (two) times daily. Patient taking differently: Take 40-120 mg by mouth See admin instructions. Take 3 tablets by mouth in the morning and then take two tablets at lunch per patient. 04/29/19  Yes Biagio Borg, MD  lactulose (CHRONULAC) 10 GM/15ML solution Take 30 mLs (20 g total) by mouth 3 (three) times daily. Patient taking differently: Take 10 g by mouth See admin instructions. Take 15 mls (10 gm) by mouth twice daily, may take a 3rd dose (10 gm) as needed for constipation 05/11/21  Yes Nicole Kindred A, DO  levETIRAcetam (KEPPRA) 500 MG tablet Take 3 tablets (1,500 mg total) by mouth 2 (two) times daily. 12/20/20  Yes Cameron Sprang, MD  Magnesium 500 MG TABS Take  500 mg by mouth every morning.   Yes [provider]  Neomycin-Bacitracin-Polymyxin (TRIPLE ANTIBIOTIC) OINT Apply 1 application topically daily.   Yes [provider]  oxyCODONE (OXY IR/ROXICODONE) 5 MG immediate release tablet Take 5 mg by mouth every 4 (four) hours as needed for pain. 05/03/21  Yes [provider]  potassium chloride (KLOR-CON) 10 MEQ tablet TAKE ONE TABLET BY MOUTH TWICE DAILY Patient taking differently: Take 20 mEq by mouth 2 (two) times daily. 01/27/20  Yes Biagio Borg, MD  simethicone (GAS-X) 80 MG chewable tablet Chew 80 mg by mouth every 6 (six) hours as needed for flatulence.   Yes [provider]  spironolactone (ALDACTONE) 100 MG tablet Take 2 tablets (200 mg total) by mouth every morning. Patient taking differently: Take 200 mg by mouth daily. 04/29/19  Yes Biagio Borg, MD  vitamin A 3 MG (10000 UNITS) capsule Take 10,000 Units by mouth daily.   Yes [provider]  Zinc 50 MG TABS Take 50 mg by mouth 3 (three) times daily.   Yes [provider]    Physical Exam: Vitals:   05/27/2021 1930 05/16/2021 1955 05/23/2021 1955 05/27/2021 2047  BP: (!)  124/52 (!) 115/45  (!) 96/43  Pulse: 99 97  100  Resp: (!) 22 (!) 28  16  Temp:      TempSrc:      SpO2: 94% 96%  95%  Weight:   88.5 kg     Physical Exam Constitutional:      General: He is not in acute distress. HENT:     Head: Normocephalic and atraumatic.     Mouth/Throat:     Mouth: Mucous membranes are dry.  Eyes:     General: Scleral icterus present.     Extraocular Movements: Extraocular movements intact.  Cardiovascular:     Rate and Rhythm: Normal rate and regular rhythm.     Pulses: Normal pulses.  Pulmonary:     Effort: Pulmonary effort is normal. No respiratory distress.     Breath sounds: Normal breath sounds. No wheezing or rales.  Abdominal:     General: Bowel sounds are normal. There is distension.     Palpations: Abdomen is soft.     Tenderness: There is no abdominal tenderness. There is no guarding or rebound.  Musculoskeletal:     Cervical back: Normal range of motion and neck supple.     Comments: +1 pitting edema of bilateral lower extremities  Skin:    General: Skin is warm and dry.  Neurological:     General: No focal deficit present.     Mental Status: He is alert.     Comments: Slightly confused and disoriented Oriented to person and place He knows the month and year but does not know today's date     Labs on Admission: I have personally reviewed following labs and imaging studies  CBC: Recent Labs  Lab 05/13/21 1235 05/14/21 0207 05/15/21 0329 05/17/21 0452 05/31/2021 1726 05/30/2021 1836  WBC 10.7* 8.0 9.1 10.5  --  17.3*  NEUTROABS 8.0*  --  6.4  --   --  13.7*  HGB 12.6* 10.8* 11.0* 11.1* 11.9* 11.4*  HCT 36.6* 31.9* 32.9* 32.0* 35.0* 33.4*  MCV 102.8* 100.9* 102.5* 101.6*  --  105.4*  PLT 107* 72* 70* 90*  --  PLATELET CLUMPS NOTED ON SMEAR, UNABLE TO ESTIMATE   Basic Metabolic Panel: Recent Labs  Lab 05/14/21 0207 05/15/21 0329 05/16/21 0243 05/17/21 8469  05/16/2021 1653 05/06/2021 1726  NA 127* 128* 126* 124* 124* 123*  K  3.5 3.6 3.4* 3.7 3.8 4.6  CL 96* 98 95* 95* 95*  --   CO2 _0 17*  --   GLUCOSE 94 104* 123* 96 83  --   BUN _1 --   CREATININE 0.84 0.71 0.71 0.59* 1.38*  --   CALCIUM 8.4* 8.5* 8.4* 8.5* 8.7*  --   MG  --   --  1.6* 1.4*  --   --   PHOS  --   --   --  2.6  --   --    GFR: Estimated Creatinine Clearance: 66.7 mL/min (A) (by C-G formula based on SCr of 1.38 mg/dL (H)). Liver Function Tests: Recent Labs  Lab 05/13/21 1235 05/14/21 0207 05/16/21 0243 05/27/2021 1653  AST 130* 108* 92* 122*  ALT 108* 93* 81* 91*  ALKPHOS 217* 175* 198* 215*  BILITOT 13.2* 10.1* 7.2* 13.2*  PROT 5.8* 4.9* 4.9* 5.4*  ALBUMIN 2.5* 2.0* 1.8* 2.0*   Recent Labs  Lab 05/13/21 1235 06/03/2021 1653  LIPASE 61* 70*   Recent Labs  Lab 05/14/21 0207 05/15/21 0329 05/16/21 0243 05/17/21 0819 05/10/2021 1653  AMMONIA 54* 90* 83* 37* 71*   Coagulation Profile: Recent Labs  Lab 05/13/21 1235 05/14/21 0207  INR 1.7* 1.8*   Cardiac Enzymes: Recent Labs  Lab 05/09/2021 1653  CKTOTAL 403*   BNP (last 3 results) No results for input(s): PROBNP in the last 8760 hours. HbA1C: No results for input(s): HGBA1C in the last 72 hours. CBG: Recent Labs  Lab 05/27/2021 1652  GLUCAP 79   Lipid Profile: No results for input(s): CHOL, HDL, LDLCALC, TRIG, CHOLHDL, LDLDIRECT in the last 72 hours. Thyroid Function Tests: No results for input(s): TSH, T4TOTAL, FREET4, T3FREE, THYROIDAB in the last 72 hours. Anemia Panel: No results for input(s): VITAMINB12, FOLATE, FERRITIN, TIBC, IRON, RETICCTPCT in the last 72 hours. Urine analysis:    Component Value Date/Time   COLORURINE AMBER (A) 05/05/2021 1945   APPEARANCEUR HAZY (A) 05/27/2021 1945   LABSPEC 1.014 05/29/2021 1945   PHURINE 5.0 05/17/2021 1945   GLUCOSEU NEGATIVE 05/21/2021 1945   GLUCOSEU NEGATIVE 06/11/2019 1128   HGBUR NEGATIVE 05/16/2021 1945   BILIRUBINUR SMALL (A) 05/18/2021 1945   KETONESUR NEGATIVE 05/05/2021 1945    PROTEINUR NEGATIVE 05/18/2021 1945   UROBILINOGEN 2.0 (A) 06/11/2019 1128   NITRITE NEGATIVE 05/18/2021 1945   LEUKOCYTESUR NEGATIVE 05/28/2021 1945    Radiological Exams on Admission: CT HEAD WO CONTRAST (5MM)  Result Date: 06/03/2021 CLINICAL DATA:  Delirium; Neck trauma, intoxicated or obtunded (Age >= 16y) EXAM: CT HEAD WITHOUT CONTRAST CT CERVICAL SPINE WITHOUT CONTRAST TECHNIQUE: Multidetector CT imaging of the head and cervical spine was performed following the standard protocol without intravenous contrast. Multiplanar CT image reconstructions of the cervical spine were also generated. COMPARISON:  July 21, 2019. FINDINGS: CT HEAD FINDINGS Brain: No evidence of acute infarction, hemorrhage, hydrocephalus, extra-axial collection or mass lesion/mass effect. Chronic similar brainstem atrophy. Mild patchy white matter hypoattenuation, nonspecific but compatible with chronic microvascular ischemic disease. Vascular: No hyperdense vessel identified. Skull: No acute fracture. Sinuses/Orbits: Visualized sinuses are clear. No acute orbital findings. Other: No mastoid effusions. CT CERVICAL SPINE FINDINGS Alignment: Similar slight anterolisthesis of C4 on C5 and C7 on T1. Otherwise, no substantial sagittal subluxation. Rotation of C1 on C2. Skull base and vertebrae: Similar vertebral body heights. Evidence of acute fracture.  Soft tissues and spinal canal: No prevertebral fluid or swelling. No visible canal hematoma. Disc levels: Severe facet arthropathy on the left at C2-C3 and C3-C4. Severe degenerative disease at C3-C4 and moderate to severe degenerative disc disease at C5-C6 and C6-C7. No evidence of high-grade bony canal stenosis. Upper chest: Clear visualized lung apices. IMPRESSION: CT Head: 1. No evidence of acute intracranial abnormality. 2. Similar chronic findings. CT Cervical Spine: 1. No evidence of acute fracture. 2. Rotation of C1 on C2, likely positional in the absence of a fixed  torticollis. 3. Multilevel severe degenerative change, as detailed above. Electronically Signed   By: Margaretha Sheffield M.D.   On: 05/14/2021 17:58   CT Cervical Spine Wo Contrast  Result Date: 05/21/2021 CLINICAL DATA:  Delirium; Neck trauma, intoxicated or obtunded (Age >= 16y) EXAM: CT HEAD WITHOUT CONTRAST CT CERVICAL SPINE WITHOUT CONTRAST TECHNIQUE: Multidetector CT imaging of the head and cervical spine was performed following the standard protocol without intravenous contrast. Multiplanar CT image reconstructions of the cervical spine were also generated. COMPARISON:  July 21, 2019. FINDINGS: CT HEAD FINDINGS Brain: No evidence of acute infarction, hemorrhage, hydrocephalus, extra-axial collection or mass lesion/mass effect. Chronic similar brainstem atrophy. Mild patchy white matter hypoattenuation, nonspecific but compatible with chronic microvascular ischemic disease. Vascular: No hyperdense vessel identified. Skull: No acute fracture. Sinuses/Orbits: Visualized sinuses are clear. No acute orbital findings. Other: No mastoid effusions. CT CERVICAL SPINE FINDINGS Alignment: Similar slight anterolisthesis of C4 on C5 and C7 on T1. Otherwise, no substantial sagittal subluxation. Rotation of C1 on C2. Skull base and vertebrae: Similar vertebral body heights. Evidence of acute fracture. Soft tissues and spinal canal: No prevertebral fluid or swelling. No visible canal hematoma. Disc levels: Severe facet arthropathy on the left at C2-C3 and C3-C4. Severe degenerative disease at C3-C4 and moderate to severe degenerative disc disease at C5-C6 and C6-C7. No evidence of high-grade bony canal stenosis. Upper chest: Clear visualized lung apices. IMPRESSION: CT Head: 1. No evidence of acute intracranial abnormality. 2. Similar chronic findings. CT Cervical Spine: 1. No evidence of acute fracture. 2. Rotation of C1 on C2, likely positional in the absence of a fixed torticollis. 3. Multilevel severe degenerative  change, as detailed above. Electronically Signed   By: Margaretha Sheffield M.D.   On: 05/27/2021 17:58    EKG: Independently reviewed.  Sinus rhythm, artifact.  No significant change since prior tracing.  Assessment/Plan Principal Problem:   Severe sepsis (HCC) Active Problems:   Seizures (HCC)   Chronic liver disease and cirrhosis (HCC)   History of alcohol abuse   Acute hepatic encephalopathy   Severe sepsis Meets criteria for severe sepsis with hypothermia, tachycardia, hypotension, leukocytosis, and lactic acidosis.  COVID and influenza PCR negative.  UA without signs of infection.  Lungs clear on exam and not endorsing any respiratory symptoms.   -Given concern for abdominal distention/ascites, paracentesis needed to rule out SBP.  Order for IR paracentesis and labs placed. -Continue cefepime -Patient was given 30 cc/kg fluid boluses per sepsis protocol in the ED and blood pressure has now improved.  Lactic acidosis improving. -Was mildly hypothermic, continue to monitor temperature closely -Blood culture x2 pending  Acute hepatic encephalopathy Ammonia level elevated compared to labs done 2 days ago at the time of discharge. -Lactulose 30 g 3 times a day, titrate to 2-3 bowel movements per day.  Continue rifaximin 550 mg twice daily.  Chronic hyponatremia Sodium chronically low.  Currently 124 and appears stable compared to labs done  2 days ago at the time of hospital discharge. -Hold home diuretics at this time given AKI.  Monitor sodium level closely.  AKI BUN 20, creatinine 1.3 (baseline 0.5-0.7).  Likely prerenal given sepsis/hypotension. -Patient was given fluid boluses in the ED.  Repeat labs to check renal function.  Hold home diuretics at this time and avoid any other nephrotoxic agents.  Normal anion gap metabolic acidosis Likely related to diuretic use and AKI. -Continue to monitor closely  Decompensated liver cirrhosis, hyperbilirubinemia, mild peripheral  edema -Recent right upper quadrant ultrasound showing cholelithiasis with no biliary ductal dilatation -Diuretics held at this time given worsening renal function. -Check coags  Hepatocellular carcinoma -Status post ablation -Will need close follow-up with [facility]  Borderline elevated CK Patient received fluid boluses. -Repeat CK level  History of alcohol abuse Currently in remission.  No evidence of alcohol withdrawal. -CIWA monitoring  Seizure disorder Patient thinks he had a seizure last night.  Per triage note, family concerned about possible seizure activity.  No seizures since he has been in the ED. -Continue Keppra -EEG ordered  DVT prophylaxis: SCDs Code Status: Full code Family Communication: No family available at this time. Disposition Plan: Status is: Inpatient  Remains inpatient appropriate because:Inpatient level of care appropriate due to severity of illness  Dispo: The patient is from: Home              Anticipated d/c is to: Home              Patient currently is not medically stable to d/c.   Difficult to place patient No  Level of care: Level of care: Progressive  The medical decision making on this patient was of high complexity and the patient is at high risk for clinical deterioration, therefore this is a level 3 visit.  Shela Leff MD [facility]  If 7PM-7AM, please contact night-coverage [email or web address]  05/07/2021, 9:56 PM

## 2021-05-20 ENCOUNTER — Inpatient Hospital Stay (HOSPITAL_COMMUNITY): Payer: 59

## 2021-05-20 ENCOUNTER — Encounter (HOSPITAL_COMMUNITY): Admission: EM | Disposition: E | Payer: Self-pay | Source: Home / Self Care | Attending: Internal Medicine

## 2021-05-20 DIAGNOSIS — R571 Hypovolemic shock: Secondary | ICD-10-CM | POA: Diagnosis not present

## 2021-05-20 DIAGNOSIS — E440 Moderate protein-calorie malnutrition: Secondary | ICD-10-CM | POA: Insufficient documentation

## 2021-05-20 DIAGNOSIS — R402443 Other coma, without documented Glasgow coma scale score, or with partial score reported, at hospital admission: Secondary | ICD-10-CM | POA: Diagnosis not present

## 2021-05-20 DIAGNOSIS — D62 Acute posthemorrhagic anemia: Secondary | ICD-10-CM

## 2021-05-20 DIAGNOSIS — J392 Other diseases of pharynx: Secondary | ICD-10-CM

## 2021-05-20 DIAGNOSIS — K7290 Hepatic failure, unspecified without coma: Secondary | ICD-10-CM | POA: Diagnosis not present

## 2021-05-20 DIAGNOSIS — R008 Other abnormalities of heart beat: Secondary | ICD-10-CM

## 2021-05-20 DIAGNOSIS — N179 Acute kidney failure, unspecified: Secondary | ICD-10-CM

## 2021-05-20 DIAGNOSIS — K920 Hematemesis: Secondary | ICD-10-CM | POA: Diagnosis not present

## 2021-05-20 DIAGNOSIS — R6521 Severe sepsis with septic shock: Secondary | ICD-10-CM | POA: Diagnosis not present

## 2021-05-20 DIAGNOSIS — Z978 Presence of other specified devices: Secondary | ICD-10-CM | POA: Diagnosis not present

## 2021-05-20 DIAGNOSIS — A419 Sepsis, unspecified organism: Secondary | ICD-10-CM

## 2021-05-20 DIAGNOSIS — R188 Other ascites: Secondary | ICD-10-CM

## 2021-05-20 DIAGNOSIS — Z8719 Personal history of other diseases of the digestive system: Secondary | ICD-10-CM

## 2021-05-20 DIAGNOSIS — R6520 Severe sepsis without septic shock: Secondary | ICD-10-CM | POA: Diagnosis not present

## 2021-05-20 DIAGNOSIS — R579 Shock, unspecified: Secondary | ICD-10-CM | POA: Diagnosis present

## 2021-05-20 LAB — CBC
HCT: 34.3 % — ABNORMAL LOW (ref 39.0–52.0)
HCT: 33.4 % — ABNORMAL LOW (ref 39.0–52.0)
Hemoglobin: 11.2 g/dL — ABNORMAL LOW (ref 13.0–17.0)
Hemoglobin: 10.9 g/dL — ABNORMAL LOW (ref 13.0–17.0)
MCH: 35 pg — ABNORMAL HIGH (ref 26.0–34.0)
MCH: 35.4 pg — ABNORMAL HIGH (ref 26.0–34.0)
MCHC: 32.7 g/dL (ref 30.0–36.0)
MCHC: 32.6 g/dL (ref 30.0–36.0)
MCV: 107.2 fL — ABNORMAL HIGH (ref 80.0–100.0)
MCV: 108.4 fL — ABNORMAL HIGH (ref 80.0–100.0)
Platelets: 142 10*3/uL — ABNORMAL LOW (ref 150–400)
Platelets: 136 10*3/uL — ABNORMAL LOW (ref 150–400)
RBC: 3.2 MIL/uL — ABNORMAL LOW (ref 4.22–5.81)
RBC: 3.08 MIL/uL — ABNORMAL LOW (ref 4.22–5.81)
RDW: 18.1 % — ABNORMAL HIGH (ref 11.5–15.5)
RDW: 18.4 % — ABNORMAL HIGH (ref 11.5–15.5)
WBC: 17.8 10*3/uL — ABNORMAL HIGH (ref 4.0–10.5)
WBC: 26.4 10*3/uL — ABNORMAL HIGH (ref 4.0–10.5)
nRBC: 0 % (ref 0.0–0.2)
nRBC: 0 % (ref 0.0–0.2)

## 2021-05-20 LAB — BODY FLUID CELL COUNT WITH DIFFERENTIAL
Eos, Fluid: 0 %
Lymphs, Fluid: 47 %
Monocyte-Macrophage-Serous Fluid: 9 % — ABNORMAL LOW (ref 50–90)
Neutrophil Count, Fluid: 44 % — ABNORMAL HIGH (ref 0–25)
Total Nucleated Cell Count, Fluid: 96 cu mm (ref 0–1000)

## 2021-05-20 LAB — COMPREHENSIVE METABOLIC PANEL
ALT: 228 U/L — ABNORMAL HIGH (ref 0–44)
AST: 430 U/L — ABNORMAL HIGH (ref 15–41)
Albumin: 1.9 g/dL — ABNORMAL LOW (ref 3.5–5.0)
Alkaline Phosphatase: 194 U/L — ABNORMAL HIGH (ref 38–126)
Anion gap: 15 (ref 5–15)
BUN: 24 mg/dL — ABNORMAL HIGH (ref 6–20)
CO2: 13 mmol/L — ABNORMAL LOW (ref 22–32)
Calcium: 8.6 mg/dL — ABNORMAL LOW (ref 8.9–10.3)
Chloride: 99 mmol/L (ref 98–111)
Creatinine, Ser: 2.01 mg/dL — ABNORMAL HIGH (ref 0.61–1.24)
GFR, Estimated: 38 mL/min — ABNORMAL LOW (ref >60)
Glucose, Bld: 45 mg/dL — ABNORMAL LOW (ref 70–99)
Potassium: 5 mmol/L (ref 3.5–5.1)
Sodium: 127 mmol/L — ABNORMAL LOW (ref 135–145)
Total Bilirubin: 15.3 mg/dL — ABNORMAL HIGH (ref 0.3–1.2)
Total Protein: 5 g/dL — ABNORMAL LOW (ref 6.5–8.1)

## 2021-05-20 LAB — ECHOCARDIOGRAM COMPLETE
AV Area VTI: 4.5 cm2
AV Area mean vel: 3.43 cm2
Area-P 1/2: 4.4 cm2
Height: 73 in
S' Lateral: 1.7 cm
Single Plane A4C EF: 58.1 %
Weight: 3120 oz

## 2021-05-20 LAB — LACTIC ACID, PLASMA
Lactic Acid, Venous: 10 mmol/L (ref 0.5–1.9)
Lactic Acid, Venous: 9.1 mmol/L (ref 0.5–1.9)

## 2021-05-20 LAB — BLOOD CULTURE ID PANEL (REFLEXED) - BCID2

## 2021-05-20 LAB — POCT I-STAT 7, (LYTES, BLD GAS, ICA,H+H)
Acid-base deficit: 11 mmol/L — ABNORMAL HIGH (ref 0.0–2.0)
Bicarbonate: 13.3 mmol/L — ABNORMAL LOW (ref 20.0–28.0)
Calcium, Ion: 1.07 mmol/L — ABNORMAL LOW (ref 1.15–1.40)
HCT: 34 % — ABNORMAL LOW (ref 39.0–52.0)
Hemoglobin: 11.6 g/dL — ABNORMAL LOW (ref 13.0–17.0)
O2 Saturation: 100 %
Patient temperature: 96.2
Potassium: 5.1 mmol/L (ref 3.5–5.1)
Sodium: 127 mmol/L — ABNORMAL LOW (ref 135–145)
TCO2: 14 mmol/L — ABNORMAL LOW (ref 22–32)
pCO2 arterial: 23.1 mmHg — ABNORMAL LOW (ref 32.0–48.0)
pH, Arterial: 7.362 (ref 7.350–7.450)
pO2, Arterial: 202 mmHg — ABNORMAL HIGH (ref 83.0–108.0)

## 2021-05-20 LAB — PROTIME-INR
INR: 3.1 — ABNORMAL HIGH (ref 0.8–1.2)
Prothrombin Time: 32.2 seconds — ABNORMAL HIGH (ref 11.4–15.2)

## 2021-05-20 LAB — PROCALCITONIN: Procalcitonin: 0.25 ng/mL

## 2021-05-20 LAB — GLUCOSE, CAPILLARY
Glucose-Capillary: 51 mg/dL — ABNORMAL LOW (ref 70–99)
Glucose-Capillary: 117 mg/dL — ABNORMAL HIGH (ref 70–99)
Glucose-Capillary: 109 mg/dL — ABNORMAL HIGH (ref 70–99)
Glucose-Capillary: 91 mg/dL (ref 70–99)
Glucose-Capillary: 119 mg/dL — ABNORMAL HIGH (ref 70–99)
Glucose-Capillary: 98 mg/dL (ref 70–99)

## 2021-05-20 LAB — APTT: aPTT: 38 seconds — ABNORMAL HIGH (ref 24–36)

## 2021-05-20 LAB — LACTATE DEHYDROGENASE, PLEURAL OR PERITONEAL FLUID: LD, Fluid: 139 U/L — ABNORMAL HIGH (ref 3–23)

## 2021-05-20 LAB — ALBUMIN, PLEURAL OR PERITONEAL FLUID: Albumin, Fluid: <1 g/dL

## 2021-05-20 LAB — AMMONIA: Ammonia: 52 umol/L — ABNORMAL HIGH (ref 9–35)

## 2021-05-20 LAB — CK: Total CK: 389 U/L (ref 49–397)

## 2021-05-20 LAB — MRSA NEXT GEN BY PCR, NASAL: MRSA by PCR Next Gen: NOT DETECTED

## 2021-05-20 SURGERY — EGD (ESOPHAGOGASTRODUODENOSCOPY)
Anesthesia: Moderate Sedation

## 2021-05-20 MED ORDER — LACTULOSE 10 GM/15ML PO SOLN: 30.0000 g | Freq: Three times a day (TID) | ORAL | Status: DC

## 2021-05-20 MED ORDER — POLYETHYLENE GLYCOL 3350 17 G PO PACK: 17.0000 g | PACK | Freq: Every day | ORAL | Status: DC | PRN

## 2021-05-20 MED ORDER — NOREPINEPHRINE 16 MG/250ML-% IV SOLN
0.0000 ug/min | INTRAVENOUS | Status: DC
  Administered 2021-05-20: 30 ug/min via INTRAVENOUS
  Filled 2021-05-20: qty 250

## 2021-05-20 MED ORDER — OCTREOTIDE ACETATE 100 MCG/ML IJ SOLN
100.0000 ug | Freq: Three times a day (TID) | INTRAMUSCULAR | Status: DC
  Administered 2021-05-20: 100 ug via SUBCUTANEOUS
  Filled 2021-05-20 (×2): qty 1

## 2021-05-20 MED ORDER — HYDROCORTISONE SOD SUC (PF) 100 MG IJ SOLR
100.0000 mg | Freq: Two times a day (BID) | INTRAMUSCULAR | Status: DC
  Administered 2021-05-20 – 2021-05-24 (×9): 100 mg via INTRAVENOUS
  Filled 2021-05-20 (×9): qty 2

## 2021-05-20 MED ORDER — PANTOPRAZOLE INFUSION (NEW) - SIMPLE MED
8.0000 mg/h | INTRAVENOUS | Status: DC
  Administered 2021-05-20 – 2021-05-22 (×5): 8 mg/h via INTRAVENOUS
  Filled 2021-05-20 (×3): qty 100
  Filled 2021-05-20 (×3): qty 80
  Filled 2021-05-20: qty 100

## 2021-05-20 MED ORDER — HEPARIN SODIUM (PORCINE) 5000 UNIT/ML IJ SOLN: 5000.0000 [IU] | Freq: Three times a day (TID) | INTRAMUSCULAR | Status: DC

## 2021-05-20 MED ORDER — SODIUM BICARBONATE 8.4 % IV SOLN
INTRAVENOUS | Status: AC
  Administered 2021-05-20: 50 meq
  Filled 2021-05-20: qty 150

## 2021-05-20 MED ORDER — FENTANYL CITRATE (PF) 100 MCG/2ML IJ SOLN
INTRAMUSCULAR | Status: AC
  Administered 2021-05-20: 50 ug
  Filled 2021-05-20: qty 2

## 2021-05-20 MED ORDER — DOCUSATE SODIUM 50 MG/5ML PO LIQD
100.0000 mg | Freq: Two times a day (BID) | ORAL | Status: DC
  Administered 2021-05-20 – 2021-05-23 (×6): 100 mg
  Filled 2021-05-20 (×6): qty 10

## 2021-05-20 MED ORDER — MIDAZOLAM HCL 2 MG/2ML IJ SOLN: 1.0000 mg | Freq: Once | INTRAMUSCULAR | Status: AC

## 2021-05-20 MED ORDER — CHLORHEXIDINE GLUCONATE 0.12% ORAL RINSE (MEDLINE KIT)
15.0000 mL | Freq: Two times a day (BID) | OROMUCOSAL | Status: DC
  Administered 2021-05-20 – 2021-05-24 (×8): 15 mL via OROMUCOSAL

## 2021-05-20 MED ORDER — ETOMIDATE 2 MG/ML IV SOLN
INTRAVENOUS | Status: AC
  Administered 2021-05-20: 20 mg via INTRAVENOUS
  Filled 2021-05-20: qty 10

## 2021-05-20 MED ORDER — SODIUM CHLORIDE 0.9 % IV BOLUS
500.0000 mL | Freq: Once | INTRAVENOUS | Status: AC
  Administered 2021-05-20: 500 mL via INTRAVENOUS

## 2021-05-20 MED ORDER — DOCUSATE SODIUM 100 MG PO CAPS: 100.0000 mg | ORAL_CAPSULE | Freq: Two times a day (BID) | ORAL | Status: DC | PRN

## 2021-05-20 MED ORDER — FENTANYL CITRATE (PF) 100 MCG/2ML IJ SOLN
INTRAMUSCULAR | Status: AC
  Filled 2021-05-20: qty 2

## 2021-05-20 MED ORDER — MIDAZOLAM HCL 2 MG/2ML IJ SOLN
INTRAMUSCULAR | Status: AC
  Filled 2021-05-20: qty 2

## 2021-05-20 MED ORDER — MIDODRINE HCL 5 MG PO TABS
10.0000 mg | ORAL_TABLET | Freq: Three times a day (TID) | ORAL | Status: DC
  Administered 2021-05-21 – 2021-05-24 (×10): 10 mg
  Filled 2021-05-20 (×11): qty 2

## 2021-05-20 MED ORDER — FENTANYL CITRATE (PF) 100 MCG/2ML IJ SOLN: 50.0000 ug | INTRAMUSCULAR | Status: DC | PRN

## 2021-05-20 MED ORDER — SODIUM BICARBONATE 8.4 % IV SOLN
50.0000 meq | Freq: Once | INTRAVENOUS | Status: AC
  Administered 2021-05-20: 50 meq via INTRAVENOUS

## 2021-05-20 MED ORDER — EPINEPHRINE 1 MG/10ML IJ SOSY
PREFILLED_SYRINGE | INTRAMUSCULAR | Status: AC
  Filled 2021-05-20: qty 10

## 2021-05-20 MED ORDER — SODIUM CHLORIDE 0.9 % IV SOLN
2.0000 g | Freq: Two times a day (BID) | INTRAVENOUS | Status: DC
  Administered 2021-05-20 (×2): 2 g via INTRAVENOUS
  Filled 2021-05-20 (×5): qty 2

## 2021-05-20 MED ORDER — DOCUSATE SODIUM 50 MG/5ML PO LIQD: 100.0000 mg | Freq: Two times a day (BID) | ORAL | Status: DC | PRN

## 2021-05-20 MED ORDER — PANTOPRAZOLE 80MG IVPB - SIMPLE MED
80.0000 mg | Freq: Once | INTRAVENOUS | Status: AC
  Administered 2021-05-20: 80 mg via INTRAVENOUS
  Filled 2021-05-20: qty 80

## 2021-05-20 MED ORDER — MIDAZOLAM HCL 2 MG/2ML IJ SOLN
INTRAMUSCULAR | Status: AC
  Administered 2021-05-20: 1 mg via INTRAVENOUS
  Filled 2021-05-20: qty 2

## 2021-05-20 MED ORDER — FENTANYL CITRATE (PF) 100 MCG/2ML IJ SOLN
100.0000 ug | Freq: Once | INTRAMUSCULAR | Status: AC
Start: 2021-05-20 — End: 2021-05-20
  Administered 2021-05-20: 100 ug via INTRAVENOUS

## 2021-05-20 MED ORDER — DEXMEDETOMIDINE HCL IN NACL 400 MCG/100ML IV SOLN
0.0000 ug/kg/h | INTRAVENOUS | Status: DC
Start: 2021-05-20 — End: 2021-05-23
  Administered 2021-05-20: 0.4 ug/kg/h via INTRAVENOUS
  Administered 2021-05-20 – 2021-05-21 (×9): 1.2 ug/kg/h via INTRAVENOUS
  Administered 2021-05-22: 1 ug/kg/h via INTRAVENOUS
  Administered 2021-05-22: 0.8 ug/kg/h via INTRAVENOUS
  Administered 2021-05-22 – 2021-05-23 (×4): 1 ug/kg/h via INTRAVENOUS
  Filled 2021-05-20 (×17): qty 100

## 2021-05-20 MED ORDER — LEVETIRACETAM 100 MG/ML PO SOLN
1500.0000 mg | Freq: Two times a day (BID) | ORAL | Status: DC
  Administered 2021-05-20 – 2021-05-24 (×8): 1500 mg
  Filled 2021-05-20 (×10): qty 15

## 2021-05-20 MED ORDER — MIDODRINE HCL 5 MG PO TABS: 10.0000 mg | ORAL_TABLET | Freq: Three times a day (TID) | ORAL | Status: DC

## 2021-05-20 MED ORDER — MIDAZOLAM HCL (PF) 5 MG/ML IJ SOLN
INTRAMUSCULAR | Status: AC
  Filled 2021-05-20: qty 2

## 2021-05-20 MED ORDER — ETOMIDATE 2 MG/ML IV SOLN: 20.0000 mg | Freq: Once | INTRAVENOUS | Status: AC

## 2021-05-20 MED ORDER — FENTANYL CITRATE (PF) 100 MCG/2ML IJ SOLN
50.0000 ug | Freq: Once | INTRAMUSCULAR | Status: DC
  Filled 2021-05-20: qty 2

## 2021-05-20 MED ORDER — NOREPINEPHRINE 16 MG/250ML-% IV SOLN
0.0000 ug/min | INTRAVENOUS | Status: DC
Start: 2021-05-20 — End: 2021-05-24
  Administered 2021-05-20: 40 ug/min via INTRAVENOUS
  Administered 2021-05-20: 31 ug/min via INTRAVENOUS
  Administered 2021-05-21: 18 ug/min via INTRAVENOUS
  Administered 2021-05-22: 5 ug/min via INTRAVENOUS
  Administered 2021-05-24: 10 ug/min via INTRAVENOUS
  Filled 2021-05-20 (×4): qty 250

## 2021-05-20 MED ORDER — ROCURONIUM BROMIDE 10 MG/ML (PF) SYRINGE
PREFILLED_SYRINGE | INTRAVENOUS | Status: AC
  Administered 2021-05-20: 50 mg via INTRAVENOUS
  Filled 2021-05-20: qty 10

## 2021-05-20 MED ORDER — DIPHENHYDRAMINE HCL 50 MG/ML IJ SOLN
INTRAMUSCULAR | Status: AC
  Filled 2021-05-20: qty 1

## 2021-05-20 MED ORDER — POLYETHYLENE GLYCOL 3350 17 G PO PACK
17.0000 g | PACK | Freq: Every day | ORAL | Status: DC | PRN
Start: 2021-05-20 — End: 2021-05-20

## 2021-05-20 MED ORDER — VASOPRESSIN 20 UNITS/100 ML INFUSION FOR SHOCK
0.0000 [IU]/min | INTRAVENOUS | Status: DC
  Administered 2021-05-20 (×3): 0.04 [IU]/min via INTRAVENOUS
  Administered 2021-05-21: 0.03 [IU]/min via INTRAVENOUS
  Administered 2021-05-21: 0.04 [IU]/min via INTRAVENOUS
  Administered 2021-05-22: 0.03 [IU]/min via INTRAVENOUS
  Filled 2021-05-20 (×6): qty 100

## 2021-05-20 MED ORDER — VITAMIN K1 10 MG/ML IJ SOLN
10.0000 mg | Freq: Once | INTRAVENOUS | Status: AC
  Administered 2021-05-20: 10 mg via INTRAVENOUS
  Filled 2021-05-20: qty 1

## 2021-05-20 MED ORDER — SODIUM CHLORIDE 0.9 % IV BOLUS
1000.0000 mL | Freq: Once | INTRAVENOUS | Status: AC
  Administered 2021-05-20: 1000 mL via INTRAVENOUS

## 2021-05-20 MED ORDER — SODIUM CHLORIDE 0.9 % IV SOLN
50.0000 ug/h | INTRAVENOUS | Status: DC
  Administered 2021-05-20 – 2021-05-24 (×8): 50 ug/h via INTRAVENOUS
  Filled 2021-05-20 (×13): qty 1

## 2021-05-20 MED ORDER — DEXTROSE 50 % IV SOLN
INTRAVENOUS | Status: AC
  Administered 2021-05-20: 50 mL via INTRAVENOUS
  Filled 2021-05-20: qty 50

## 2021-05-20 MED ORDER — FENTANYL BOLUS VIA INFUSION
50.0000 ug | INTRAVENOUS | Status: DC | PRN
  Administered 2021-05-20: 75 ug via INTRAVENOUS
  Administered 2021-05-21: 50 ug via INTRAVENOUS
  Administered 2021-05-21 – 2021-05-22 (×3): 100 ug via INTRAVENOUS
  Administered 2021-05-24: 50 ug via INTRAVENOUS
  Filled 2021-05-20: qty 100

## 2021-05-20 MED ORDER — CHLORHEXIDINE GLUCONATE CLOTH 2 % EX PADS
6.0000 | MEDICATED_PAD | Freq: Every day | CUTANEOUS | Status: DC
Start: 2021-05-20 — End: 2021-05-24
  Administered 2021-05-21 – 2021-05-24 (×4): 6 via TOPICAL

## 2021-05-20 MED ORDER — ORAL CARE MOUTH RINSE
15.0000 mL | OROMUCOSAL | Status: DC
  Administered 2021-05-20 – 2021-05-24 (×36): 15 mL via OROMUCOSAL

## 2021-05-20 MED ORDER — RIFAXIMIN 550 MG PO TABS
550.0000 mg | ORAL_TABLET | Freq: Two times a day (BID) | ORAL | Status: DC
  Administered 2021-05-20 – 2021-05-24 (×8): 550 mg
  Filled 2021-05-20 (×9): qty 1

## 2021-05-20 MED ORDER — FENTANYL CITRATE (PF) 100 MCG/2ML IJ SOLN
INTRAMUSCULAR | Status: AC
  Filled 2021-05-20: qty 4

## 2021-05-20 MED ORDER — FENTANYL 2500MCG IN NS 250ML (10MCG/ML) PREMIX INFUSION
0.0000 ug/h | INTRAVENOUS | Status: DC
  Administered 2021-05-20 – 2021-05-24 (×3): 50 ug/h via INTRAVENOUS
  Administered 2021-05-24: 350 ug/h via INTRAVENOUS
  Filled 2021-05-20 (×4): qty 250

## 2021-05-20 MED ORDER — POLYETHYLENE GLYCOL 3350 17 G PO PACK: 17.0000 g | PACK | Freq: Every day | ORAL | Status: DC

## 2021-05-20 MED ORDER — ROCURONIUM BROMIDE 10 MG/ML (PF) SYRINGE: 50.0000 mg | PREFILLED_SYRINGE | Freq: Once | INTRAVENOUS | Status: AC

## 2021-05-20 MED ORDER — ALBUMIN HUMAN 25 % IV SOLN
25.0000 g | Freq: Four times a day (QID) | INTRAVENOUS | Status: AC
Start: 2021-05-20 — End: 2021-05-22
  Administered 2021-05-20 – 2021-05-22 (×8): 25 g via INTRAVENOUS
  Filled 2021-05-20 (×8): qty 100

## 2021-05-20 MED ORDER — NOREPINEPHRINE 4 MG/250ML-% IV SOLN: 0.0000 ug/min | INTRAVENOUS | Status: DC

## 2021-05-20 MED ORDER — DEXTROSE 50 % IV SOLN: 1.0000 | Freq: Once | INTRAVENOUS | Status: AC

## 2021-05-20 MED ORDER — SODIUM CHLORIDE 0.9 % IV SOLN: 250.0000 mL | INTRAVENOUS | Status: DC

## 2021-05-20 MED ORDER — FENTANYL CITRATE (PF) 100 MCG/2ML IJ SOLN
50.0000 ug | Freq: Once | INTRAMUSCULAR | Status: AC
Start: 2021-05-20 — End: 2021-05-20
  Administered 2021-05-20: 50 ug via INTRAVENOUS

## 2021-05-20 MED ORDER — LACTULOSE 10 GM/15ML PO SOLN
30.0000 g | Freq: Three times a day (TID) | ORAL | Status: DC
  Administered 2021-05-20 – 2021-05-22 (×5): 30 g
  Filled 2021-05-20 (×5): qty 45

## 2021-05-20 MED ORDER — NOREPINEPHRINE 4 MG/250ML-% IV SOLN
2.0000 ug/min | INTRAVENOUS | Status: DC
  Administered 2021-05-20: 5 ug/min via INTRAVENOUS
  Filled 2021-05-20 (×2): qty 250

## 2021-05-20 MED ORDER — FENTANYL CITRATE (PF) 100 MCG/2ML IJ SOLN
50.0000 ug | Freq: Once | INTRAMUSCULAR | Status: AC
  Administered 2021-05-20: 50 ug via INTRAVENOUS

## 2021-05-20 NOTE — ED Notes (Signed)
Patient continues to [facility] and when ask what is wrong , he said he hurts all over.

## 2021-05-20 NOTE — Significant Event (Signed)
I was notified by the patient's nurse patient was persistently hypotensive.  Patient was admitted for altered mental status with known history of cirrhosis of liver and concern for sepsis.  Reviewed labs and examined patient at bedside.  Patient complains of abdominal discomfort and abdomen appears distended but not rigid.  Patient is following commands moving all extremities.  Oriented to his name and place.  Refuses blood products.  Blood pressure 70/40 with pulse of 96/min temperature 97.4 at 1 point it was 96.1 respiration around 32 bpm.  Patient's symptoms are concerning for possible septic shock and since patient has already received 2 L fluids and patient refusing albumin we will start patient on Levophed and continue antibiotics.  I have consulted pulmonary critical care.  Gean Birchwood

## 2021-05-20 NOTE — ED Notes (Signed)
Dr. Kakrakandy at bedside. 

## 2021-05-20 NOTE — Procedures (Signed)
Intubation Procedure Note  Joel Titman  MD:[id number]  [birth year]  Date:05/18/2021  Time:11:00 AM   Provider Performing:Mitchael Luckey D Rollene Rotunda    Procedure: Intubation ([id number])  Indication(s) Respiratory Failure  Consent Risks of the procedure as well as the alternatives and risks of each were explained to the patient and/or caregiver.  Consent for the procedure was obtained and is signed in the bedside chart   Anesthesia Etomidate and Rocuronium   Time Out Verified patient identification, verified procedure, site/side was marked, verified correct patient position, special equipment/implants available, medications/allergies/relevant history reviewed, required imaging and test results available.   Sterile Technique Usual hand hygeine, masks, and gloves were used   Procedure Description Patient positioned in bed supine.  Sedation given as noted above.  Patient was intubated with endotracheal tube using Glidescope.  View was Grade 1 full glottis .  Number of attempts was 1.  Colorimetric CO2 detector was consistent with tracheal placement.   Complications/Tolerance None; patient tolerated the procedure well. Chest X-ray is ordered to verify placement.   EBL Minimal   Specimen(s) None  [name] 05/10/2021, 11:00 AM  Please see [email or web address] for pager details.  From 7A-7P if no response, please call [phone]. After hours, please call ELink [phone].

## 2021-05-20 NOTE — ED Notes (Signed)
EEG setting up at bedside.

## 2021-05-20 NOTE — Progress Notes (Signed)
OG tube advanced 5 cm per radiology suggestions.

## 2021-05-20 NOTE — ED Notes (Signed)
Dr. Hal Hope aware of patient current condition and will be here. To see

## 2021-05-20 NOTE — Progress Notes (Signed)
PHARMACY - PHYSICIAN COMMUNICATION CRITICAL VALUE ALERT - BLOOD CULTURE IDENTIFICATION (BCID)  [name] is an 55 y.o. male who presented to [facility] on 05/30/2021 with a chief complaint of Seizure, etoh cirrhosis, septic shock   Assessment:   Patient is critically ill with concern for septic shock 2/2 SBP as source 1/4 Blood cultures positive for strep species (no resistance pattern) Currently on meropenem with adequate coverage  Name of physician (or Provider) Contacted: Dr. Salley Slaughter  Current antibiotics:  Meropenem 2gm q12hr (renal dose adjusted)  Changes to prescribed antibiotics recommended:  Patient is on recommended antibiotics - No changes needed  Results for orders placed or performed during the hospital encounter of 05/18/2021  Blood Culture ID Panel (Reflexed) (Collected: 05/21/2021  7:50 PM)  Result Value Ref Range   Enterococcus faecalis NOT DETECTED NOT DETECTED   Enterococcus Faecium NOT DETECTED NOT DETECTED   Listeria monocytogenes NOT DETECTED NOT DETECTED   Staphylococcus species NOT DETECTED NOT DETECTED   Staphylococcus aureus (BCID) NOT DETECTED NOT DETECTED   Staphylococcus epidermidis NOT DETECTED NOT DETECTED   Staphylococcus lugdunensis NOT DETECTED NOT DETECTED   Streptococcus species DETECTED (A) NOT DETECTED   Streptococcus agalactiae NOT DETECTED NOT DETECTED   Streptococcus pneumoniae NOT DETECTED NOT DETECTED   Streptococcus pyogenes NOT DETECTED NOT DETECTED   A.calcoaceticus-baumannii NOT DETECTED NOT DETECTED   Bacteroides fragilis NOT DETECTED NOT DETECTED   Enterobacterales NOT DETECTED NOT DETECTED   Enterobacter cloacae complex NOT DETECTED NOT DETECTED   Escherichia coli NOT DETECTED NOT DETECTED   Klebsiella aerogenes NOT DETECTED NOT DETECTED   Klebsiella oxytoca NOT DETECTED NOT DETECTED   Klebsiella pneumoniae NOT DETECTED NOT DETECTED   Proteus species NOT DETECTED NOT DETECTED   Salmonella species NOT DETECTED NOT DETECTED    Serratia marcescens NOT DETECTED NOT DETECTED   Haemophilus influenzae NOT DETECTED NOT DETECTED   Neisseria meningitidis NOT DETECTED NOT DETECTED   Pseudomonas aeruginosa NOT DETECTED NOT DETECTED   Stenotrophomonas maltophilia NOT DETECTED NOT DETECTED   Candida albicans NOT DETECTED NOT DETECTED   Candida auris NOT DETECTED NOT DETECTED   Candida glabrata NOT DETECTED NOT DETECTED   Candida krusei NOT DETECTED NOT DETECTED   Candida parapsilosis NOT DETECTED NOT DETECTED   Candida tropicalis NOT DETECTED NOT DETECTED   Cryptococcus neoformans/gattii NOT DETECTED NOT DETECTED   Donnald Garre, PharmD Clinical Pharmacist  Please check AMION for all [place] numbers After 10:00 PM, call [place] [phone]

## 2021-05-20 NOTE — ED Notes (Signed)
CCM  called spoke with Richardson Landry Minor NP about current patient VS, fluids ordered. NS up to infuse 999/750cc.

## 2021-05-20 NOTE — Progress Notes (Signed)
Initial Nutrition Assessment  DOCUMENTATION CODES:   Non-severe (moderate) malnutrition in context of chronic illness  INTERVENTION:   Once medically appropriate, recommend initiation of enteral nutrition: - Start Vital 1.5 @ 20 ml/hr and advance by 10 ml q 8 hours to goal rate of 60 ml/hr (1440 ml/day) - ProSource TF 45 ml TID  Recommended tube feeding regimen at goal would provide 2280 kcal, 130 grams of protein, and 1100 ml of H2O.  NUTRITION DIAGNOSIS:   Moderate Malnutrition related to chronic illness (cirrhosis, hepatocellular carcinoma) as evidenced by moderate fat depletion, moderate muscle depletion.  GOAL:   Patient will meet greater than or equal to 90% of their needs  MONITOR:   Vent status, Labs, Weight trends, I & O's  REASON FOR ASSESSMENT:   Ventilator    ASSESSMENT:   55 year old male who presented to the ED on 9/15 after being found face-down in closet. PMH of cirrhosis 2/2 EtOH abuse, hepatic encephalopathy, esophageal varices, chronic hyponatremia, chronic thrombocytopenia, hepatocellular carcinoma s/p TACE, seizure disorder, bipolar disorder, Wilson's disease. Pt with recent discharge from the hospital after being treated for hepatic encephalopathy. Pt admitted with severe sepsis, AKI.  9/16 - intubated, s/p paracentesis  Discussed pt with RN and during ICU rounds. Per review of abdominal x-ray, pt with OG tube tip in the mid stomach but proximal side port at the level of the GE junction. Radiology recommending advancement by 5-6 cm. Spoke with charge RN to make aware.  Pt with total of 1 L of coffee-ground output from OG tube since placement earlier today. Pt with profound shock, septic vs hemorrhagic. Pt on 2 pressors at this time. Lactic acid is 10.0. GI following, starting PPI infusion and octreotide infusion. Pt may need EGD at bedside. RD to leave tube feeding recommendations for use once pt is medically stable.  Per notes, pt's EtOH abuse is in  remission. Reviewed weight history in chart. Pt's weight has fluctuated between 88.5-95.3 kg over the last year. Suspect weight fluctuations related to fluid status given pt has required multiple paracentesis. Admission weight of 88.5 kg (195 lbs) appears to be stated rather than measured. Unsure of pt's dry weight at this time. No family present at time of RD visit. Unable to obtain diet or weight history at this time.  Patient is currently intubated on ventilator support MV: 14.7 L/min Temp (24hrs), Avg:96.5 F (35.8 C), Min:96.1 F (35.6 C), Max:97.4 F (36.3 C) BP (a-line): 133/48 MAP (a-line): 77  Drips: Precedex Fentanyl Levophed Vasopressin Octreotide  Medications reviewed and include: colace, IV solu-cortef, lactulose, IV albumin, IV abx, IV protonix, IV vitamin K  Labs reviewed: sodium 127, BUN 24, creatinine 2.01, ionized calcium 1.07, elevated LFTs, lactic acid 10.0 CBG's: 51-117 x 24 hours  UOP: 350 ml x 12 hours  NUTRITION - FOCUSED PHYSICAL EXAM:  Flowsheet Row Most Recent Value  Orbital Region Moderate depletion  Upper Arm Region Moderate depletion  Thoracic and Lumbar Region Unable to assess  Buccal Region Unable to assess  Temple Region Severe depletion  Clavicle Bone Region Moderate depletion  Clavicle and Acromion Bone Region Moderate depletion  Scapular Bone Region Unable to assess  Dorsal Hand Moderate depletion  Patellar Region Unable to assess  [edema]  Anterior Thigh Region Unable to assess  [edema]  Posterior Calf Region Unable to assess  [edema]  Edema (RD Assessment) Moderate  [BUE, BLE]  Hair Reviewed  Eyes Unable to assess  Mouth Unable to assess  Skin Reviewed  [jaundiced]  Nails  Reviewed       Diet Order:   Diet Order             Diet NPO time specified  Diet effective now                   EDUCATION NEEDS:   Not appropriate for education at this time  Skin:  Skin Assessment: Reviewed RN Assessment  Last BM:  no  documented BM  Height:   Ht Readings from Last 1 Encounters:  06/03/2021 '6\' 1"'$  (1.854 m)    Weight:   Wt Readings from Last 1 Encounters:  05/27/2021 88.5 kg    BMI:  Body mass index is 25.73 kg/m.  Estimated Nutritional Needs:   Kcal:  2200-2400  Protein:  120-140 grams  Fluid:  >/= 2.0 L    Gustavus Bryant, MS, RD, LDN Inpatient Clinical Dietitian Please see AMiON for contact information.

## 2021-05-20 NOTE — Procedures (Signed)
Arterial Catheter Insertion Procedure Note  Joel Rothermel  [id number]  Dec 23, 1965  Date:05/12/2021  Time:2:31 PM    Provider Performing: Jose Persia    Procedure: Insertion of Arterial Line [phone]) with US guidance [id number])   Indication(s) Blood pressure monitoring and/or need for frequent ABGs  Consent Risks of the procedure as well as the alternatives and risks of each were explained to the patient and/or caregiver.  Consent for the procedure was obtained and is signed in the bedside chart  Anesthesia None   Time Out Verified patient identification, verified procedure, site/side was marked, verified correct patient position, special equipment/implants available, medications/allergies/relevant history reviewed, required imaging and test results available.   Sterile Technique Maximal sterile technique including full sterile barrier drape, hand hygiene, sterile gown, sterile gloves, mask, hair covering, sterile ultrasound probe cover (if used).   Procedure Description Area of catheter insertion was cleaned with chlorhexidine and draped in sterile fashion. With real-time ultrasound guidance an arterial catheter was placed into the right  Axillary  artery.  Appropriate arterial tracings confirmed on monitor.     Complications/Tolerance None; patient tolerated the procedure well.   EBL Minimal   Specimen(s) None

## 2021-05-20 NOTE — Procedures (Signed)
Central Venous Catheter Insertion Procedure Note  Floyde Shake  [id number]  07-29-1966  Date:05/15/2021  Time:3:24 PM   Provider Performing:Oswin Griffith   Procedure: Insertion of Non-tunneled Central Venous [phone]) with US guidance [id number])   Indication(s) Medication administration  Consent Risks of the procedure as well as the alternatives and risks of each were explained to the patient and/or caregiver.  Consent for the procedure was obtained and is signed in the bedside chart  Anesthesia Topical only with 1% lidocaine   Timeout Verified patient identification, verified procedure, site/side was marked, verified correct patient position, special equipment/implants available, medications/allergies/relevant history reviewed, required imaging and test results available.  Sterile Technique Maximal sterile technique including full sterile barrier drape, hand hygiene, sterile gown, sterile gloves, mask, hair covering, sterile ultrasound probe cover (if used).  Procedure Description Area of catheter insertion was cleaned with chlorhexidine and draped in sterile fashion.  With real-time ultrasound guidance a central venous catheter was placed into the right subclavian vein. Nonpulsatile blood flow and easy flushing noted in all ports.  The catheter was sutured in place and sterile dressing applied.  Complications/Tolerance None; patient tolerated the procedure well. Chest X-ray is ordered to verify placement for internal jugular or subclavian cannulation.   Chest x-ray is not ordered for femoral cannulation.  EBL Minimal  Specimen(s) None

## 2021-05-20 NOTE — Consult Note (Signed)
NAME:  [name], MRN:  [id number], DOB:  1966/04/04, LOS: 1 ADMISSION DATE:  05/10/2021, CONSULTATION DATE:  05/05/2021  REFERRING MD:  Hal Hope, CHIEF COMPLAINT: Hypotension  History of Present Illness:  55 year old man with known history of alcoholic cirrhosis and varices and seizure disorder recently discharged from the hospital 2 days ago for hepatic encephalopathy.  He states that he had a seizure and fell down the stairs.  He was found facedown in his closet brought in by EMS with GCS of 8 which improved.  Initially hypothermic to 96, initial labs showed WBC of 17 K, sodium of 124 which is chronically low and increase in creatinine from baseline 0.7-1.3 lipase was 70 total bilirubin slightly increased. Initial lactate was 5.3 and decreased to 4.6 ammonia level was 71. He was initially admitted by [facility] but then developed hypotension and peripheral Levophed was started and PCCM called for admission He complains of pain all over, denies fever chills heartburn micturition or cough  Pertinent  Medical History  Seizure disorder on Keppra EtOH cirrhosis with varices and hepatic encephalopathy. Chronic hyponatremia Hepatic carcinoma Bipolar disorder Jehovah's Witness  Significant Hospital Events: Including procedures, antibiotic start and stop dates in addition to other pertinent events     Interim History / Subjective:    Objective   Blood pressure (!) 72/31, pulse 96, temperature (!) 97.4 F (36.3 C), temperature source Rectal, resp. rate (!) 32, weight 88.5 kg, SpO2 98 %.        Intake/Output Summary (Last 24 hours) at 05/05/2021 4665 Last data filed at 06/02/2021 1940 Gross per 24 hour  Intake --  Output 350 ml  Net -350 ml   Filed Weights   05/26/2021 1955  Weight: 88.5 kg    Examination: General: Acutely ill-appearing, mild distress, moaning and groaning HENT: Mild pallor, no icterus, no JVD, dry mucosa Lungs: Decreased breath sounds bilateral, no  rhonchi no accessory muscle use Cardiovascular: S1-S2 distant Abdomen: Distended, no fluid thrill, harsher dullness, no guarding Extremities: No deformity, no edema Neuro: Awake interactive, able to provide history, oriented to place and self   Resolved Hospital Problem list     Assessment & Plan:  Shock of unclear etiology.  Bedside echo showed poor windows, LV appears to be hyperdynamic.  Unable to evaluate RV.  We will treat as presumed sepsis.  Initial lactate was high  Septic shock -unclear source, suspect SBP although not obvious. Continue cefepime. Received fluids, okay to use Levophed peripherally up to 10 mics Repeat lactate. 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 and will accept albumin, no other blood products -he states he has a card in his wallet which states what products he will accept but I was unable to find this   AKI -due to above Repeat be met, hold diuretics Gentle hydration  Seizure -known seizure disorder, compliant with Keppra May need neuro input, hyponatremia could also be a trigger  Chronic hyponatremia -hold diuretics and let him equilibrate, use isotonic fluids   Acute hepatic encephalopathy Decompensated hep C cirrhosis Mild coagulopathy  -Supportive care -Continue lactulose and rifaximin   Best Practice (right click and "Reselect all SmartList Selections" daily)   Diet/type: NPO DVT prophylaxis: prophylactic heparin  GI prophylaxis: PPI Lines: N/A Foley:  N/A Code Status:  full code Last date of multidisciplinary goals of care discussion [NA]  Labs   CBC: Recent Labs  Lab 05/13/21 1235 05/14/21 0207 05/15/21 0329 05/17/21 0452 05/26/2021 1726 05/05/2021 1836  WBC 10.7*  8.0 9.1 10.5  --  17.3*  NEUTROABS 8.0*  --  6.4  --   --  13.7*  HGB 12.6* 10.8* 11.0* 11.1* 11.9* 11.4*  HCT 36.6* 31.9* 32.9* 32.0* 35.0* 33.4*  MCV 102.8* 100.9* 102.5* 101.6*  --  105.4*  PLT 107* 72* 70* 90*  --  PLATELET CLUMPS NOTED ON  SMEAR, UNABLE TO ESTIMATE    Basic Metabolic Panel: Recent Labs  Lab 05/14/21 0207 05/15/21 0329 05/16/21 0243 05/17/21 0452 05/21/2021 1653 05/18/2021 1726  NA 127* 128* 126* 124* 124* 123*  K 3.5 3.6 3.4* 3.7 3.8 4.6  CL 96* 98 95* 95* 95*  --   CO2 24 23 24 22  17*  --   GLUCOSE 94 104* 123* 96 83  --   BUN 15 12 13 10 20   --   CREATININE 0.84 0.71 0.71 0.59* 1.38*  --   CALCIUM 8.4* 8.5* 8.4* 8.5* 8.7*  --   MG  --   --  1.6* 1.4*  --   --   PHOS  --   --   --  2.6  --   --    GFR: Estimated Creatinine Clearance: 66.7 mL/min (A) (by C-G formula based on SCr of 1.38 mg/dL (H)). Recent Labs  Lab 05/14/21 0207 05/15/21 0329 05/17/21 0452 05/13/2021 1623 05/13/2021 1836  WBC 8.0 9.1 10.5  --  17.3*  LATICACIDVEN  --   --   --  5.3* 4.6*    Liver Function Tests: Recent Labs  Lab 05/13/21 1235 05/14/21 0207 05/16/21 0243 05/15/2021 1653  AST 130* 108* 92* 122*  ALT 108* 93* 81* 91*  ALKPHOS 217* 175* 198* 215*  BILITOT 13.2* 10.1* 7.2* 13.2*  PROT 5.8* 4.9* 4.9* 5.4*  ALBUMIN 2.5* 2.0* 1.8* 2.0*   Recent Labs  Lab 05/13/21 1235 05/08/2021 1653  LIPASE 61* 70*   Recent Labs  Lab 05/14/21 0207 05/15/21 0329 05/16/21 0243 05/17/21 0819 06/01/2021 1653  AMMONIA 54* 90* 83* 37* 71*    ABG    Component Value Date/Time   HCO3 20.4 05/14/2021 1726   TCO2 21 (L) 06/01/2021 1726   ACIDBASEDEF 4.0 (H) 05/31/2021 1726   O2SAT 99.0 05/23/2021 1726     Coagulation Profile: Recent Labs  Lab 05/13/21 1235 05/14/21 0207  INR 1.7* 1.8*    Cardiac Enzymes: Recent Labs  Lab 05/05/2021 1653  CKTOTAL 403*    HbA1C: Hgb A1c MFr Bld  Date/Time Value Ref Range Status  01/07/2013 12:00 PM 4.4 (L) 4.6 - 6.5 % Final    Comment:    Glycemic Control Guidelines for People with Diabetes:Non Diabetic:  <6%Goal of Therapy: <7%Additional Action Suggested:  >8%     CBG: Recent Labs  Lab 05/28/2021 1652  GLUCAP 79    Review of Systems:   Complains of pain all over. No  hematemesis, melena No fevers, chills, weight loss No diarrhea Reports fall and seizure, no headache No chest pain, palpitations No shortness of breath, wheezing, cough  Past Medical History:  He,  has a past medical history of Abuse, drug or alcohol ([place]), Anemia, Bipolar affective disorder ([place]), Cancer ([place]), Chronic liver disease and cirrhosis, Depression, DJD (degenerative joint disease), Dyslipidemia, Encephalopathy, Esophageal varices ([place]), Hernia, History of alcohol abuse, PONV (postoperative nausea and vomiting), Seizures ([place]), Thrombocytopenia ([place]), Wilson disease, and Wrist fracture.   Surgical History:   Past Surgical History:  Procedure Laterality Date   ESOPHAGOGASTRODUODENOSCOPY     HARDWARE REMOVAL Right 01/22/2015   Procedure:  HARDWARE REMOVAL RIGHT LEG;  Surgeon: Leandrew Koyanagi, MD;  Location: [facility];  Service: Orthopedics;  Laterality: Right;   IR PARACENTESIS  10/04/2018   IR PARACENTESIS  04/29/2019   IR PARACENTESIS  05/10/2021   left inguinal hernia  june 2012   [place]   liver cancer  02/2015   ablation   microablation of liver     OPEN REDUCTION INTERNAL FIXATION (ORIF) DISTAL RADIAL FRACTURE Right 12/21/2016   Procedure: OPEN REDUCTION INTERNAL FIXATION (ORIF) RIGHT DISTAL RADIUS FRACTURE;  Surgeon: Leandrew Koyanagi, MD;  Location: [place];  Service: Orthopedics;  Laterality: Right;   OPEN REDUCTION INTERNAL FIXATION (ORIF) TIBIA/FIBULA FRACTURE Right 07/25/2013   Procedure: OPEN REDUCTION INTERNAL FIXATION (ORIF) RIGHT TIBIAL PLATEAU, TIBIAL SHAFT AND FIBULA FRACTURES, POSSIBLE FASCIOTOMIES;  Surgeon: Marianna Payment, MD;  Location: [facility];  Service: Orthopedics;  Laterality: Right;   ORIF WRIST FRACTURE Left 07/23/2019   Procedure: OPEN REDUCTION INTERNAL FIXATION (ORIF) WRIST FRACTURE;  Surgeon: Leandrew Koyanagi, MD;  Location: [facility];  Service: Orthopedics;  Laterality: Left;   TOTAL HIP ARTHROPLASTY Left 07/23/2019   Procedure: TOTAL HIP ARTHROPLASTY ANTERIOR  APPROACH;  Surgeon: Leandrew Koyanagi, MD;  Location: [place];  Service: Orthopedics;  Laterality: Left;   TOTAL KNEE ARTHROPLASTY Right 07/15/2015   Procedure: RIGHT TOTAL KNEE ARTHROPLASTY;  Surgeon: Leandrew Koyanagi, MD;  Location: [facility];  Service: Orthopedics;  Laterality: Right;   VASCULAR SURGERY       Social History:   reports that he has been smoking cigarettes. He has been smoking an average of .5 packs per day. He has never used smokeless tobacco. He reports that he does not drink alcohol and does not use drugs.   Family History:  His family history includes Dementia in his mother; Diabetes in his father; Glaucoma in his brother; Heart disease in an other family member.   Allergies Allergies  Allergen Reactions   Codeine Nausea And Vomiting and Other (See Comments)    Reports it makes me crazy.      Home Medications  Prior to Admission medications   Medication Sig Start Date End Date Taking? Authorizing Provider  acetaminophen (TYLENOL) 500 MG tablet Take 1,500 mg by mouth every 6 (six) hours as needed (pain).   Yes [provider]  baclofen (LIORESAL) 10 MG tablet Take 10 mg by mouth at bedtime.   Yes [provider]  Cholecalciferol (VITAMIN D-3) 125 MCG (5000 UT) TABS Take 5,000 Units by mouth every morning.   Yes [provider]  diphenhydrAMINE (BENADRYL) 25 MG tablet Take 50 mg by mouth at bedtime.   Yes [provider]  furosemide (LASIX) 40 MG tablet Take 2 tablets (80 mg total) by mouth 2 (two) times daily. Patient taking differently: Take 40-120 mg by mouth See admin instructions. Take 3 tablets by mouth in the morning and then take two tablets at lunch per patient. 04/29/19  Yes Biagio Borg, MD  lactulose (CHRONULAC) 10 GM/15ML solution Take 30 mLs (20 g total) by mouth 3 (three) times daily. Patient taking differently: Take 10 g by mouth See admin instructions. Take 15 mls (10 gm) by mouth twice daily, may take a 3rd dose (10 gm) as  needed for constipation 05/11/21  Yes Nicole Kindred A, DO  levETIRAcetam (KEPPRA) 500 MG tablet Take 3 tablets (1,500 mg total) by mouth 2 (two) times daily. 12/20/20  Yes Cameron Sprang, MD  Magnesium 500 MG TABS Take 500  mg by mouth every morning.   Yes [provider]  Neomycin-Bacitracin-Polymyxin (TRIPLE ANTIBIOTIC) OINT Apply 1 application topically daily.   Yes [provider]  oxyCODONE (OXY IR/ROXICODONE) 5 MG immediate release tablet Take 5 mg by mouth every 4 (four) hours as needed for pain. 05/03/21  Yes [provider]  potassium chloride (KLOR-CON) 10 MEQ tablet TAKE ONE TABLET BY MOUTH TWICE DAILY Patient taking differently: Take 20 mEq by mouth 2 (two) times daily. 01/27/20  Yes Biagio Borg, MD  simethicone (GAS-X) 80 MG chewable tablet Chew 80 mg by mouth every 6 (six) hours as needed for flatulence.   Yes [provider]  spironolactone (ALDACTONE) 100 MG tablet Take 2 tablets (200 mg total) by mouth every morning. Patient taking differently: Take 200 mg by mouth daily. 04/29/19  Yes Biagio Borg, MD  vitamin A 3 MG (10000 UNITS) capsule Take 10,000 Units by mouth daily.   Yes [provider]  Zinc 50 MG TABS Take 50 mg by mouth 3 (three) times daily.   Yes [provider]     Critical care time: [place] MD. FCCP. [facility] Pager : [phone]  If no response to pager , please call [phone] until 7 pm After 7:00 pm call [place]  [phone]   05/17/2021

## 2021-05-20 NOTE — Progress Notes (Addendum)
NAME:  [name], MRN:  [id number], DOB:  01-03-1966, LOS: 1 ADMISSION DATE:  05/11/2021, CONSULTATION DATE:  05/29/2021  REFERRING MD:  Hal Hope, CHIEF COMPLAINT: Hypotension  History of Present Illness:  55 year old man with known history of alcoholic cirrhosis and varices and seizure disorder recently discharged from the hospital 2 days ago for hepatic encephalopathy.  He states that he had a seizure and fell down the stairs.  He was found facedown in his closet brought in by EMS with GCS of 8 which improved.  Initially hypothermic to 96, initial labs showed WBC of 17 K, sodium of 124 which is chronically low and increase in creatinine from baseline 0.7-1.3 lipase was 70 total bilirubin slightly increased. Initial lactate was 5.3 and decreased to 4.6 ammonia level was 71. He was initially admitted by [facility] but then developed hypotension and peripheral Levophed was started and PCCM called for admission He complains of pain all over, denies fever chills heartburn micturition or cough  Pertinent  Medical History  Seizure disorder on Keppra EtOH cirrhosis with varices and hepatic encephalopathy. Chronic hyponatremia Hepatic carcinoma Bipolar disorder Jehovah's Witness  Significant Hospital Events: Including procedures, antibiotic start and stop dates in addition to other pertinent events   9/15 > Admitted to [facility].  9/16 > Hypotension requiring Levophed in the early AM. Transferred to ICU.   Interim History / Subjective:   On arrival to ICU, patient was noted to be hypotensive with MAPs in the 45-50s. He is awake and alert, answers most questions appropriately. He endorses diffuse abdominal pain with radiation to his back.   He agrees to Albumin, however denies blood products (Jehovah Witness)   Objective   Blood pressure (!) 115/39, pulse 96, temperature (!) 96.2 F (35.7 C), temperature source Axillary, resp. rate (!) 31, height '6\' 1"'$  (1.854 m), weight 88.5 kg, SpO2  100 %.    Vent Mode: PRVC FiO2 (%):  [100 %] 100 % Set Rate:  [26 bmp] 26 bmp Vt Set:  [630 mL] 630 mL PEEP:  [5 cmH20] 5 cmH20 Plateau Pressure:  [13 cmH20] 13 cmH20   Intake/Output Summary (Last 24 hours) at 05/26/2021 1118 Last data filed at 05/18/2021 1940 Gross per 24 hour  Intake --  Output 350 ml  Net -350 ml    Filed Weights   06/01/2021 1955  Weight: 88.5 kg   Examination: General: Critically ill-appearing male who appears older than stated age. Mild distress, moaning and groaning HENT: Scleral icterus with dry mucus membranes. No blood in oropharynx.  Lungs: Coarse breathe sounds throughout with mildly increased work of breathing.  Cardiovascular: Regular rate and rhythm. No murmurs.  Abdomen: Distended and tender to palpation throughout.  Extremities: Trace pitting edema of the bilateral lower extremities. Extremely jaundiced.  Neuro: Awake, interactive, able to provide history, oriented to place and self. Short attention span.   Resolved Hospital Problem list   N/A  Assessment & Plan:   Septic Shock:  Hypotension with elevated WBC. Suspected etiology is SBP. Given critical illness requiring high dose pressor support, will switch Cefepime to Meropenem.   - Meropenem per pharmacy  - Hydrocortisone 100 mg BID - Continue Levophed and Vasopressin to maintain MAP > 65 - Albumin q6h  - Diagnostic paracentesis in the afternoon  Acute Hypoxic Respiratory Failure  Increased WOB with bibasilar infiltrates. Question aspiration pneumonia; already on broad spectrum antibiotics. Unfortunately, patient did require intubation.   - Full vent support  - VAP measures  - SBT daily -  Fentanyl and Propofol for sedation - Meropenem   Acute Kidney Injury Suspect pre-renal in the setting of infection versus hepatorenal syndrome. Minimal urine output overnight.   - Midodrine 10 mg TID - Octreotide 100 mcg TID  - Hold diuretics - Aggressive IVF resuscitation with NS and  Albumin - Strict in/outs - Trend renal indexes   Upper GI Bleed NGT output is dark brown - concerning for blood. Hx of varices. Coagulopathy present with INR of 3.1.   - GI consult for consideration of EGD - Trend hemoglobin  - Unable to tolerate BB given hypotension  - Octreotide  - Vitamin K x1  Acute Encephalopathy  Likely hepatic, but may be secondary to infection   - Continue Lactulose and Rifaximin   Decompensated Cirrhosis  Secondary to Wilson's and Alcohol use Disorder. He has been abstinent for several years now though. Multiple complications include encephalopathy, ascites, HCC. Management as above.   Chronic Hyponatremia  - Stable  - Trend sodium   Seizure History  - Continue home Keppra    Best Practice (right click and "Reselect all SmartList Selections" daily)   Diet/type: NPO DVT prophylaxis: SCDs GI prophylaxis: PPI Lines: Central line Foley:  N/A Code Status:  full code Last date of multidisciplinary goals of care discussion [NA]  Labs   CBC: Recent Labs  Lab 05/13/21 1235 05/14/21 0207 05/15/21 0329 05/17/21 0452 05/25/2021 1726 05/16/2021 1836 05/09/2021 0529  WBC 10.7* 8.0 9.1 10.5  --  17.3* 17.8*  NEUTROABS 8.0*  --  6.4  --   --  13.7*  --   HGB 12.6* 10.8* 11.0* 11.1* 11.9* 11.4* 11.2*  HCT 36.6* 31.9* 32.9* 32.0* 35.0* 33.4* 34.3*  MCV 102.8* 100.9* 102.5* 101.6*  --  105.4* 107.2*  PLT 107* 72* 70* 90*  --  PLATELET CLUMPS NOTED ON SMEAR, UNABLE TO ESTIMATE 142*     Basic Metabolic Panel: Recent Labs  Lab 05/15/21 0329 05/16/21 0243 05/17/21 0452 05/05/2021 1653 05/29/2021 1726 05/12/2021 0529  NA 128* 126* 124* 124* 123* 127*  K 3.6 3.4* 3.7 3.8 4.6 5.0  CL 98 95* 95* 95*  --  99  CO2 '23 24 22 '$ 17*  --  13*  GLUCOSE 104* 123* 96 83  --  45*  BUN '12 13 10 20  '$ --  24*  CREATININE 0.71 0.71 0.59* 1.38*  --  2.01*  CALCIUM 8.5* 8.4* 8.5* 8.7*  --  8.6*  MG  --  1.6* 1.4*  --   --   --   PHOS  --   --  2.6  --   --   --      GFR: Estimated Creatinine Clearance: 45.8 mL/min (A) (by C-G formula based on SCr of 2.01 mg/dL (H)). Recent Labs  Lab 05/15/21 0329 05/17/21 0452 06/02/2021 1623 05/08/2021 1836 05/10/2021 0529  WBC 9.1 10.5  --  17.3* 17.8*  LATICACIDVEN  --   --  5.3* 4.6* 10.0*     Liver Function Tests: Recent Labs  Lab 05/13/21 1235 05/14/21 0207 05/16/21 0243 05/31/2021 1653 05/12/2021 0529  AST 130* 108* 92* 122* 430*  ALT 108* 93* 81* 91* 228*  ALKPHOS 217* 175* 198* 215* 194*  BILITOT 13.2* 10.1* 7.2* 13.2* 15.3*  PROT 5.8* 4.9* 4.9* 5.4* 5.0*  ALBUMIN 2.5* 2.0* 1.8* 2.0* 1.9*    Recent Labs  Lab 05/13/21 1235 05/06/2021 1653  LIPASE 61* 70*    Recent Labs  Lab 05/15/21 0329 05/16/21 0243 05/17/21 0819 06/01/2021 1653  05/25/2021 0529  AMMONIA 90* 83* 37* 71* 52*     ABG    Component Value Date/Time   HCO3 20.4 05/25/2021 1726   TCO2 21 (L) 05/06/2021 1726   ACIDBASEDEF 4.0 (H) 06/03/2021 1726   O2SAT 99.0 06/02/2021 1726      Coagulation Profile: Recent Labs  Lab 05/13/21 1235 05/14/21 0207 05/05/2021 0529  INR 1.7* 1.8* 3.1*     Cardiac Enzymes: Recent Labs  Lab 05/06/2021 1653 05/29/2021 0529  CKTOTAL 403* 389     HbA1C: Hgb A1c MFr Bld  Date/Time Value Ref Range Status  01/07/2013 12:00 PM 4.4 (L) 4.6 - 6.5 % Final    Comment:    Glycemic Control Guidelines for People with Diabetes:Non Diabetic:  <6%Goal of Therapy: <7%Additional Action Suggested:  >8%     CBG: Recent Labs  Lab 05/29/2021 1652 05/25/2021 0812 05/13/2021 0835  GLUCAP 79 51* 117*     Review of Systems:   Complains of pain all over. No hematemesis, melena No fevers, chills, weight loss No diarrhea Reports fall and seizure, no headache No chest pain, palpitations No shortness of breath, wheezing, cough  Past Medical History:  He,  has a past medical history of Abuse, drug or alcohol ([place]), Anemia, Bipolar affective disorder ([place]), Cancer ([place]), Chronic liver disease and  cirrhosis, Depression, DJD (degenerative joint disease), Dyslipidemia, Encephalopathy, Esophageal varices ([place]), Hernia, History of alcohol abuse, PONV (postoperative nausea and vomiting), Seizures ([place]), Thrombocytopenia ([place]), Wilson disease, and Wrist fracture.   Surgical History:   Past Surgical History:  Procedure Laterality Date   ESOPHAGOGASTRODUODENOSCOPY     HARDWARE REMOVAL Right 01/22/2015   Procedure: HARDWARE REMOVAL RIGHT LEG;  Surgeon: Leandrew Koyanagi, MD;  Location: [place];  Service: Orthopedics;  Laterality: Right;   IR PARACENTESIS  10/04/2018   IR PARACENTESIS  04/29/2019   IR PARACENTESIS  05/10/2021   left inguinal hernia  june 2012   [place]   liver cancer  02/2015   ablation   microablation of liver     OPEN REDUCTION INTERNAL FIXATION (ORIF) DISTAL RADIAL FRACTURE Right 12/21/2016   Procedure: OPEN REDUCTION INTERNAL FIXATION (ORIF) RIGHT DISTAL RADIUS FRACTURE;  Surgeon: Leandrew Koyanagi, MD;  Location: [facility];  Service: Orthopedics;  Laterality: Right;   OPEN REDUCTION INTERNAL FIXATION (ORIF) TIBIA/FIBULA FRACTURE Right 07/25/2013   Procedure: OPEN REDUCTION INTERNAL FIXATION (ORIF) RIGHT TIBIAL PLATEAU, TIBIAL SHAFT AND FIBULA FRACTURES, POSSIBLE FASCIOTOMIES;  Surgeon: Marianna Payment, MD;  Location: [place] [facility];  Service: Orthopedics;  Laterality: Right;   ORIF WRIST FRACTURE Left 07/23/2019   Procedure: OPEN REDUCTION INTERNAL FIXATION (ORIF) WRIST FRACTURE;  Surgeon: Leandrew Koyanagi, MD;  Location: [facility];  Service: Orthopedics;  Laterality: Left;   TOTAL HIP ARTHROPLASTY Left 07/23/2019   Procedure: TOTAL HIP ARTHROPLASTY ANTERIOR APPROACH;  Surgeon: Leandrew Koyanagi, MD;  Location: [place];  Service: Orthopedics;  Laterality: Left;   TOTAL KNEE ARTHROPLASTY Right 07/15/2015   Procedure: RIGHT TOTAL KNEE ARTHROPLASTY;  Surgeon: Leandrew Koyanagi, MD;  Location: [facility] [place];  Service: Orthopedics;  Laterality: Right;   VASCULAR SURGERY       Social History:   reports that he has been  smoking cigarettes. He has been smoking an average of .5 packs per day. He has never used smokeless tobacco. He reports that he does not drink alcohol and does not use drugs.   Family History:  His family history includes Dementia in his mother; Diabetes in his father; Glaucoma in  his brother; Heart disease in an other family member.   Allergies Allergies  Allergen Reactions   Codeine Nausea And Vomiting and Other (See Comments)    Reports it makes me crazy.      Home Medications  Prior to Admission medications   Medication Sig Start Date End Date Taking? Authorizing Provider  acetaminophen (TYLENOL) 500 MG tablet Take 1,500 mg by mouth every 6 (six) hours as needed (pain).   Yes [provider]  baclofen (LIORESAL) 10 MG tablet Take 10 mg by mouth at bedtime.   Yes [provider]  Cholecalciferol (VITAMIN D-3) 125 MCG (5000 UT) TABS Take 5,000 Units by mouth every morning.   Yes [provider]  diphenhydrAMINE (BENADRYL) 25 MG tablet Take 50 mg by mouth at bedtime.   Yes [provider]  furosemide (LASIX) 40 MG tablet Take 2 tablets (80 mg total) by mouth 2 (two) times daily. Patient taking differently: Take 40-120 mg by mouth See admin instructions. Take 3 tablets by mouth in the morning and then take two tablets at lunch per patient. 04/29/19  Yes Biagio Borg, MD  lactulose (CHRONULAC) 10 GM/15ML solution Take 30 mLs (20 g total) by mouth 3 (three) times daily. Patient taking differently: Take 10 g by mouth See admin instructions. Take 15 mls (10 gm) by mouth twice daily, may take a 3rd dose (10 gm) as needed for constipation 05/11/21  Yes Nicole Kindred A, DO  levETIRAcetam (KEPPRA) 500 MG tablet Take 3 tablets (1,500 mg total) by mouth 2 (two) times daily. 12/20/20  Yes Cameron Sprang, MD  Magnesium 500 MG TABS Take 500 mg by mouth every morning.   Yes [provider]  Neomycin-Bacitracin-Polymyxin (TRIPLE ANTIBIOTIC) OINT Apply 1  application topically daily.   Yes [provider]  oxyCODONE (OXY IR/ROXICODONE) 5 MG immediate release tablet Take 5 mg by mouth every 4 (four) hours as needed for pain. 05/03/21  Yes [provider]  potassium chloride (KLOR-CON) 10 MEQ tablet TAKE ONE TABLET BY MOUTH TWICE DAILY Patient taking differently: Take 20 mEq by mouth 2 (two) times daily. 01/27/20  Yes Biagio Borg, MD  simethicone (GAS-X) 80 MG chewable tablet Chew 80 mg by mouth every 6 (six) hours as needed for flatulence.   Yes [provider]  spironolactone (ALDACTONE) 100 MG tablet Take 2 tablets (200 mg total) by mouth every morning. Patient taking differently: Take 200 mg by mouth daily. 04/29/19  Yes Biagio Borg, MD  vitamin A 3 MG (10000 UNITS) capsule Take 10,000 Units by mouth daily.   Yes [provider]  Zinc 50 MG TABS Take 50 mg by mouth 3 (three) times daily.   Yes [provider]     Critical care time: [place] MD. FCCP. [facility] Pager : [phone]  If no response to pager , please call [phone] until 7 pm After 7:00 pm call Elink  [phone]   05/29/2021

## 2021-05-20 NOTE — Progress Notes (Signed)
Pharmacy Antibiotic Note  [name] is a 55 y.o. male admitted on 05/25/2021 with  sepsis secondary to intra-abdominal infection .  Pharmacy has been consulted for meropenem dosing.  Patient had a rising white count and required the addition of vasopressors (norepinephrine) while on cefepime.  Serum creatinine used: 2.01 mg/dl Creatinine clearance used: 45.8 ml/min  Plan: Discontinue cefepime Meropenem 2g every 12 hours Monitor renal function Monitor fever curve, WBC count, clinical s/sx of infection  Weight: 88.5 kg (195 lb)  Temp (24hrs), Avg:96.5 F (35.8 C), Min:96.1 F (35.6 C), Max:97.4 F (36.3 C)  Recent Labs  Lab 05/14/21 0207 05/15/21 0329 05/16/21 0243 05/17/21 0452 05/27/2021 1623 05/31/2021 1653 05/28/2021 1836 05/30/2021 0529  WBC 8.0 9.1  --  10.5  --   --  17.3* 17.8*  CREATININE 0.84 0.71 0.71 0.59*  --  1.38*  --  2.01*  LATICACIDVEN  --   --   --   --  5.3*  --  4.6* 10.0*    Estimated Creatinine Clearance: 45.8 mL/min (A) (by C-G formula based on SCr of 2.01 mg/dL (H)).    Allergies  Allergen Reactions   Codeine Nausea And Vomiting and Other (See Comments)    Reports it makes me crazy.     Antimicrobials this admission: Metronidazole 9/15 x1 Cefepime 9/15 >> 9/16 Meropenem 9/16 >>  Dose adjustments this admission: None  Microbiology results: 9/15 BCx: ng <12h   Thank you for allowing pharmacy to be a part of this patient's care.  Zenaida Deed, PharmD PGY1 Acute Care Pharmacy Resident  Phone: [phone] 06/01/2021  9:08 AM  Please check [email or web address] for unit-specific pharmacy phone numbers.

## 2021-05-20 NOTE — Progress Notes (Signed)
Interval Progress Note:   Patient's brother [name] and his wife are at bedside. Updated given regarding clinical status.   [name] would like to transition to DNR status, however he will first contact patient's other two siblings to make a group decision. Patient has two adult children from whom he has been estranged from 43 years.   Dr. Jose Persia Internal Medicine PGY-3 05/22/2021, 4:53 PM

## 2021-05-20 NOTE — Procedures (Signed)
Patient Name: [name]  MRN: [id number]  Epilepsy Attending: Lora Havens  Referring Physician/Provider: Dr Derrick Ravel Date: 05/17/2021 Duration: 24.14 mins  Patient history: 55 year old male with history of seizures, now with altered mental status.  EEG to evaluate for seizures.  Level of alertness: Awake  AEDs during EEG study: LEV  Technical aspects: This EEG study was done with scalp electrodes positioned according to the 10-20 International system of electrode placement. Electrical activity was acquired at a sampling rate of '500Hz'$  and reviewed with a high frequency filter of '70Hz'$  and a low frequency filter of '1Hz'$ . EEG data were recorded continuously and digitally stored.   Description: No clear posterior dominant rhythm was seen.  EEG showed continuous generalized and lateralized right hemisphere 3 to 5 Hz theta-delta slowing with overriding 15 to 18 Hz beta activity in left hemisphere. Physiologic photic driving was not seen during photic stimulation.  Sharp transients were also noted in right temporal region.  Hyperventilation was not performed.     ABNORMALITY -Continued slow, generalized and lateralized right hemisphere  IMPRESSION: This study is suggestive of cortical dysfunction in right hemisphere likely secondary to underlying structural abnormality, postictal state. Additionally there is moderate diffuse encephalopathy, nonspecific etiology. No seizures or definite epileptiform discharges were seen throughout the recording.  If concern for ictal-interictal activity persists, please consider long-term monitoring.    Chevella Pearce Barbra Sarks

## 2021-05-20 NOTE — Procedures (Signed)
Central Venous Catheter Insertion Procedure Note  Taggart Washabaugh  [id number]  1966/01/30  Date:06/01/2021  Time:10:13 AM   Provider Performing:Anvita Hirata   Procedure: Insertion of Non-tunneled Central Venous [phone]) with US guidance [id number]   Indication(s) Medication administration  Consent Risks of the procedure as well as the alternatives and risks of each were explained to the patient and/or caregiver.  Consent for the procedure was obtained and is signed in the bedside chart  Anesthesia Topical only with 1% lidocaine   Timeout Verified patient identification, verified procedure, site/side was marked, verified correct patient position, special equipment/implants available, medications/allergies/relevant history reviewed, required imaging and test results available.  Sterile Technique Maximal sterile technique including full sterile barrier drape, hand hygiene, sterile gown, sterile gloves, mask, hair covering, sterile ultrasound probe cover (if used).  Procedure Description Area of catheter insertion was cleaned with chlorhexidine and draped in sterile fashion.  With real-time ultrasound guidance a central venous catheter was placed into the left internal jugular vein. Nonpulsatile blood flow and easy flushing noted in all ports.  The catheter was sutured in place and sterile dressing applied.  Complications/Tolerance None; patient tolerated the procedure well. Chest X-ray is ordered to verify placement for internal jugular or subclavian cannulation.   Chest x-ray is not ordered for femoral cannulation.  EBL Minimal  Specimen(s) None

## 2021-05-20 NOTE — Progress Notes (Signed)
EEG complete - results pending 

## 2021-05-20 NOTE — Procedures (Signed)
Paracentesis Procedure Note  Joel Mallernee  [id number]  09/07/1965  Date:05/05/2021  Time:2:30 PM   Provider Performing:Halley Kincer Charleen Kirks    Procedure: Paracentesis with imaging guidance [id number])  Indication(s) Ascites  Consent Risks of the procedure as well as the alternatives and risks of each were explained to the patient and/or caregiver.  Consent for the procedure was obtained and is signed in the bedside chart  Anesthesia Topical only with 1% lidocaine    Time Out Verified patient identification, verified procedure, site/side was marked, verified correct patient position, special equipment/implants available, medications/allergies/relevant history reviewed, required imaging and test results available.   Sterile Technique Maximal sterile technique including full sterile barrier drape, hand hygiene, sterile gown, sterile gloves, mask, hair covering, sterile ultrasound probe cover (if used).   Procedure Description Ultrasound used to identify appropriate peritoneal anatomy for placement and overlying skin marked.  Area of drainage cleaned and draped in sterile fashion. Lidocaine was used to anesthetize the skin and subcutaneous tissue.  5 cc's of yellow, benign appearing fluid was drained. Catheter then removed and bandaid applied to site.   Complications/Tolerance None; patient tolerated the procedure well.   EBL Minimal   Specimen(s) Peritoneal fluid

## 2021-05-20 NOTE — Consult Note (Addendum)
Referring Provider:  [facility]         Primary Care Physician:  Cher Nakai, MD Primary Gastroenterologist:  Althia Forts  Followed at [facility] by Dr. Rayvon Char and Dr. Rosey Bath   Reason for Consultation:    GI bleed in setting of cirrhosis.                ASSESSMENT / PLAN    # 55 yo male followed by [place] and Oncology Records reviewed in [place]. He has Wilson's disease and cirrhosis with portal HTN and history of esophageal varcies banding in 2015. Subsequent EGDs showing grade 1 varices, portal hypertensive gastropathy . Cirrhosis also complicated by [facility] s/p ablation in 2016.  Recent recurrence at TACE site. He had another ablation on 05/03/21. Admitted to [facility] earlier this month with encephalopathy. Discharged home but now readmitted with AMS / shock.   # Altered mental status, AKI / shock. Infectious workup in progress. Lactic acid is 10. He has coffee ground OG tube output (  1 liter) today there is concern for hemorrhagic shock. At this point he isn't having any melena.  Rule out esophageal varices, portal gastropathy, PUD, erosive disease --I cannot located any recent EGD's in [place].  I do know that he had esophageal banding back in 2015 but apparently subsequent EGDs have shown only small varices --PCCM giving vitamin K for coagulopathy --PPI infusion --Reasonable to start Octreotide infusion --Will possibly need EGD at bedside, especially if he shows signs of persistent bleeding.  --Critical care has just told me that his ascitic fluid looks clear ( await lab analysis).  Of note his ascitic fluid neutrophil count earlier this month was 266 -- Already on antibiotics which would cover him for SBP.  --We may need to contact [place] depending on clinical course. He is followed by [facility] services but I do not know his current status.   # [place]ev[place]ah's Witness. He accepts Albumin. No blood transfusions  HISTORY OF PRESENT ILLNESS                                                                                                                          Chief Complaint: cirrhosis, GI bleed  [name] is a 54 y.o. male with a past medical history significant for Wilson's disease, cirrhosis complicated by [facility] s/p ablation, bipolar disorder, seizures due to alcohol.,  History of alcohol abuse (in remission). See PMH for any additional medical history.    We saw patient in the hospital earlier this month for evaluation of encephalopathy and ascites.  He was admitted here from 05/09/21 through 05/11/21 as [facility] was on "bypass". He had had a lap with [place] RFA ablation 05/03/2021.  According to our consultation note patient has stopped taking his lactulose as he was having regular bowel movements without it.  That admission a CT scan showed free air in the upper abdomen felt to be likely to his recent laparoscopy.  There was thickening of the colon wall with possible pneumatosis, mild dilation of the small bowel.   [place] surgery was consulted, no acute surgical intervention was needed.  We resumed lactulose, added rifaximin. C[facility] recommended that he be discharged home on antibiotics for questionable colitis but doesn't look like this was done. We felt bowel wall thickening could be secondary to hypoalbuminemia  / ascites. He did received Zosyn while hospitalized.  Ascitic fluid was suspicious for SBP with 266 neutrophil count.   ED course:  Brought to ED 05/31/2021 after being found down at home.  He is currently admitted with decompensated cirrhosis, altered mental status , severe sepsis, AKI.  WBC 17.3.    Patient has required intubation for airway protection. He has been hypotensive requiring vasopressors. Infectious workup in progress for sepsis.  Getting Meropenem. Critical Care in room now doing a diagnostic paracentesis. Earlier an OG tube was placed and a liter of dark brown fluid cam out. No melena. His hgb is stable at 11  .   PREVIOUS ENDOSCOPIC EVALUATIONS   01/2014 EGD.  Grade 1 varices with scarring from previous banding.  Portal hypertensive gastropathy.  Gastritis. 11/2015 colonoscopy with removal of 5 polyps located in the cecum, transverse, sigmoid and rectum. 11/2015 EGD.  Normal.  No recurrent varices.  Scarring from previous banding in esophagus noted 04/2018 EGD.  Nonbleeding grade 1 stool esophageal varices, no stigmata of bleeding.  Prepyloric gastric erosions without bleeding.  Mild portal hypertensive gastropathy. 09/10/2020 EGD.  Nonbleeding, grade 1 varix with extensive scarring.  Normal stomach and examined duodenum.   Past Medical History:  Diagnosis Date   Abuse, drug or alcohol ([place])    Anemia    Bipolar affective disorder ([place])    [facility])    liver cancer treated with micrablation   Chronic liver disease and cirrhosis    Depression    DJD (degenerative joint disease)    right knee   Dyslipidemia    Encephalopathy    Esophageal varices (HCC)    Hernia    History of alcohol abuse    PONV (postoperative nausea and vomiting)    Seizures (HCC)    Dr Jannifer Franklin   Thrombocytopenia ([place])    Wilson disease    Wrist fracture    right    Past Surgical History:  Procedure Laterality Date   ESOPHAGOGASTRODUODENOSCOPY     HARDWARE REMOVAL Right 01/22/2015   Procedure: HARDWARE REMOVAL RIGHT LEG;  Surgeon: Leandrew Koyanagi, MD;  Location: [place];  Service: Orthopedics;  Laterality: Right;   IR PARACENTESIS  10/04/2018   IR PARACENTESIS  04/29/2019   IR PARACENTESIS  05/10/2021   left inguinal hernia  june 2012   [place]   liver cancer  02/2015   ablation   microablation of liver     OPEN REDUCTION INTERNAL FIXATION (ORIF) DISTAL RADIAL FRACTURE Right 12/21/2016   Procedure: OPEN REDUCTION INTERNAL FIXATION (ORIF) RIGHT DISTAL RADIUS FRACTURE;  Surgeon: Leandrew Koyanagi, MD;  Location: [place];  Service: Orthopedics;  Laterality: Right;   OPEN REDUCTION INTERNAL  FIXATION (ORIF) TIBIA/FIBULA FRACTURE Right 07/25/2013   Procedure: OPEN REDUCTION INTERNAL FIXATION (ORIF) RIGHT TIBIAL PLATEAU, TIBIAL SHAFT AND FIBULA FRACTURES, POSSIBLE FASCIOTOMIES;  Surgeon: Marianna Payment, MD;  Location: [place];  Service: Orthopedics;  Laterality: Right;   ORIF WRIST FRACTURE Left 07/23/2019   Procedure: OPEN REDUCTION INTERNAL FIXATION (ORIF) WRIST FRACTURE;  Surgeon: Leandrew Koyanagi, MD;  Location: [place];  Service: Orthopedics;  Laterality: Left;   TOTAL HIP ARTHROPLASTY Left 07/23/2019   Procedure: TOTAL HIP ARTHROPLASTY ANTERIOR APPROACH;  Surgeon: Leandrew Koyanagi, MD;  Location: [place];  Service: Orthopedics;  Laterality: Left;   TOTAL KNEE ARTHROPLASTY Right 07/15/2015   Procedure: RIGHT TOTAL KNEE ARTHROPLASTY;  Surgeon: Leandrew Koyanagi, MD;  Location: [place];  Service: Orthopedics;  Laterality: Right;   VASCULAR SURGERY      Prior to Admission medications   Medication Sig Start Date End Date Taking? Authorizing Provider  acetaminophen (TYLENOL) 500 MG tablet Take 1,500 mg by mouth every 6 (six) hours as needed (pain).   Yes [provider]  baclofen (LIORESAL) 10 MG tablet Take 10 mg by mouth at bedtime.   Yes [provider]  Cholecalciferol (VITAMIN D-3) 125 MCG (5000 UT) TABS Take 5,000 Units by mouth every morning.   Yes [provider]  diphenhydrAMINE (BENADRYL) 25 MG tablet Take 50 mg by mouth at bedtime.   Yes [provider]  furosemide (LASIX) 40 MG tablet Take 2 tablets (80 mg total) by mouth 2 (two) times daily. Patient taking differently: Take 40-120 mg by mouth See admin instructions. Take 3 tablets by mouth in the morning and then take two tablets at lunch 04/29/19  Yes Biagio Borg, MD  lactulose [facility]) 10 GM/15ML solution Take 30 mLs (20 g total) by mouth 3 (three) times daily. Patient taking differently: Take 10 g by mouth See admin instructions. Take 15 mls (10 gm) by mouth twice daily, may take a 3rd dose (10  gm) as needed for constipation 05/11/21  Yes Nicole Kindred A, DO  levETIRAcetam (KEPPRA) 500 MG tablet Take 3 tablets (1,500 mg total) by mouth 2 (two) times daily. 12/20/20  Yes Cameron Sprang, MD  Magnesium 500 MG TABS Take 500 mg by mouth every morning.   Yes [provider]  Neomycin-Bacitracin-Polymyxin (TRIPLE ANTIBIOTIC) OINT Apply 1 application topically daily.   Yes [provider]  oxyCODONE (OXY IR/ROXICODONE) 5 MG immediate release tablet Take 5 mg by mouth every 4 (four) hours as needed for pain. 05/03/21  Yes [provider]  potassium chloride (KLOR-CON) 10 MEQ tablet TAKE ONE TABLET BY MOUTH TWICE DAILY Patient taking differently: Take 20 mEq by mouth 2 (two) times daily. 01/27/20  Yes Biagio Borg, MD  simethicone (GAS-X) 80 MG chewable tablet Chew 80 mg by mouth every 6 (six) hours as needed for flatulence.   Yes [provider]  spironolactone (ALDACTONE) 100 MG tablet Take 2 tablets (200 mg total) by mouth every morning. Patient taking differently: Take 200 mg by mouth daily. 04/29/19  Yes Biagio Borg, MD  vitamin A 3 MG (10000 UNITS) capsule Take 10,000 Units by mouth daily.   Yes [provider]  Zinc 50 MG TABS Take 50 mg by mouth 3 (three) times daily.   Yes [provider]    Current Facility-Administered Medications  Medication Dose Route Frequency Provider Last Rate Last Admin   0.9 %  sodium chloride infusion  250 mL Intravenous Continuous Rise Patience, MD 999 mL/hr at 05/30/2021 0951 Rate Change at 05/10/2021 0951   albumin human 25 % solution 25 g  25 g Intravenous Q6H Candee Furbish, MD 60 mL/hr at 06/01/2021 0902 25 g at 05/13/2021 0902   Chlorhexidine Gluconate Cloth 2 % PADS 6 each  6 each Topical Daily Candee Furbish, MD  dexmedetomidine (PRECEDEX) 400 MCG/100ML (4 mcg/mL) infusion  0-1.2 mcg/kg/hr Intravenous Continuous Mick Sell, PA-C 8.85 mL/hr at 05/10/2021 1117 0.4 mcg/kg/hr at 05/18/2021 1117    docusate (COLACE) 50 MG/5ML liquid 100 mg  100 mg Per Tube BID Mick Sell, PA-C       docusate sodium (COLACE) capsule 100 mg  100 mg Oral BID PRN [place] Crane M, PA-C       fentaNYL (SUBLIMAZE) injection 50 mcg  50 mcg Intravenous Q15 min PRN Mick Sell, PA-C       fentaNYL (SUBLIMAZE) injection 50-200 mcg  50-200 mcg Intravenous Q30 min PRN Mick Sell, PA-C       hydrocortisone sodium succinate (SOLU-CORTEF) 100 MG injection 100 mg  100 mg Intravenous Q12H Candee Furbish, MD   100 mg at 05/16/2021 1038   lactulose (CHRONULAC) 10 GM/15ML solution 30 g  30 g Oral TID Shela Leff, MD       levETIRAcetam (KEPPRA) tablet 1,500 mg  1,500 mg Oral BID Shela Leff, MD   1,500 mg at 05/27/2021 2224   meropenem (MERREM) 2 g in sodium chloride 0.9 % 100 mL IVPB  2 g Intravenous Q12H Liz Beach, RPH 200 mL/hr at 05/06/2021 1040 2 g at 05/29/2021 1040   midodrine (PROAMATINE) tablet 10 mg  10 mg Oral Q8H Candee Furbish, MD       norepinephrine (LEVOPHED) 16 mg in 266m premix infusion  0-120 mcg/min Intravenous Titrated SCandee Furbish MD 37.5 mL/hr at 05/23/2021 1129 40 mcg/min at 05/31/2021 1129   octreotide (SANDOSTATIN) 500 mcg in sodium chloride 0.9 % 250 mL (2 mcg/mL) infusion  50 mcg/hr Intravenous Continuous BJose Persia MD       polyethylene glycol (MIRALAX / GLYCOLAX) packet 17 g  17 g Oral Daily PRN RNevada CraneM, PA-C       polyethylene glycol (MIRALAX / GLYCOLAX) packet 17 g  17 g Per Tube Daily PAndres LabrumD, PA-C       rifaximin (Doreene Nest tablet 550 mg  550 mg Oral BID RShela Leff MD       sodium bicarbonate injection 50 mEq  50 mEq Intravenous Once SCandee Furbish MD       vasopressin (PITRESSIN) 20 Units in sodium chloride 0.9 % 100 mL infusion-*FOR SHOCK*  0-0.04 Units/min Intravenous Continuous SCandee Furbish MD 12 mL/hr at 05/22/2021 1035 0.04 Units/min at 05/29/2021 1035    Allergies as of 05/05/2021 - Review Complete 05/11/2021  Allergen Reaction  Noted   Codeine Nausea And Vomiting and Other (See Comments) 04/14/2011    Family History  Problem Relation Age of Onset   Heart disease Other    Diabetes Father    Dementia Mother    Glaucoma Brother     Social History   Socioeconomic History   Marital status: Divorced    Spouse name: Not on file   Number of children: 2   Years of education: HS   Highest education level: Not on file  Occupational History   Occupation: disabled    Employer: [employer]  Tobacco Use   Smoking status: Every Day    Packs/day: 0.50    Types: Cigarettes   Smokeless tobacco: Never  Vaping Use   Vaping Use: Never used  Substance and Sexual Activity   Alcohol use: No    Alcohol/week: 0.0 standard drinks    Comment: 3-4 bottles a week, quit drinking 2005   Drug use: No    Comment:  last use 2005   Sexual activity: Yes  Other Topics Concern   Not on file  Social History Narrative   Patient is right handed.   Patient drinks some caffeine daily.   Lives in a one story home.   Social Determinants of Health   Financial Resource Strain: Not on file  Food Insecurity: Not on file  Transportation Needs: Not on file  Physical Activity: Not on file  Stress: Not on file  Social Connections: Not on file  Intimate Partner Violence: Not on file    Review of Systems: All systems reviewed and negative except where noted in HPI.     OBJECTIVE    Physical Exam: Vital signs in last 24 hours: Temp:  [96.1 F (35.6 C)-97.4 F (36.3 C)] 96.2 F (35.7 C) (09/16 0700) Pulse Rate:  [89-101] 96 (09/16 0957) Resp:  [13-37] 31 (09/16 0957) BP: (72-124)/(27-86) 115/39 (09/16 0957) SpO2:  [94 %-100 %] 100 % (09/16 1058) FiO2 (%):  [100 %] 100 % (09/16 1058) Weight:  [88.5 kg] 88.5 kg (09/15 1955)   General:   Partial alert male, intubated. In  NAD. Deeply jaundiced Eyes:  Pupils equal, Nose:  No deformity, discharge,  or lesions. Neck:  Supple; no masses Lungs:  Intubated. No wheezes in chest.    Heart:  Regular rate and rhythm;  no lower extremity edema Abdomen:  Soft, mildly distended. Doesn't appear tender.  BS active, no palp mass   Rectal:  Deferred  Msk:  Symmetrical without gross deformities. . Neurologic:  Partially sedated.  Skin:  Intact without significant lesions or rashes.   Scheduled inpatient medications  Chlorhexidine Gluconate Cloth  6 each Topical Daily   docusate  100 mg Per Tube BID   hydrocortisone sod succinate (SOLU-CORTEF) inj  100 mg Intravenous Q12H   lactulose  30 g Oral TID   levETIRAcetam  1,500 mg Oral BID   midodrine  10 mg Oral Q8H   polyethylene glycol  17 g Per Tube Daily   rifaximin  550 mg Oral BID   sodium bicarbonate  50 mEq Intravenous Once      Intake/Output from previous day: 09/15 0701 - 09/16 0700 In: -  Out: 350 [Urine:350] Intake/Output this shift: No intake/output data recorded.   Lab Results: Recent Labs    05/16/2021 1726 05/06/2021 1836 05/08/2021 0529  WBC  --  17.3* 17.8*  HGB 11.9* 11.4* 11.2*  HCT 35.0* 33.4* 34.3*  PLT  --  PLATELET CLUMPS NOTED ON SMEAR, UNABLE TO ESTIMATE 142*   BMET Recent Labs    05/05/2021 1653 05/30/2021 1726 05/17/2021 0529  NA 124* 123* 127*  K 3.8 4.6 5.0  CL 95*  --  99  CO2 17*  --  13*  GLUCOSE 83  --  45*  BUN 20  --  24*  CREATININE 1.38*  --  2.01*  CALCIUM 8.7*  --  8.6*   LFT Recent Labs    05/07/2021 0529  PROT 5.0*  ALBUMIN 1.9*  AST 430*  ALT 228*  ALKPHOS 194*  BILITOT 15.3*   PT/INR Recent Labs    05/06/2021 0529  LABPROT 32.2*  INR 3.1*   Hepatitis Panel No results for input(s): HEPBSAG, HCVAB, HEPAIGM, HEPBIGM in the last 72 hours.   . CBC Latest Ref Rng & Units 05/21/2021 05/13/2021 05/09/2021  WBC 4.0 - 10.5 K/uL 17.8(H) 17.3(H) -  Hemoglobin 13.0 - 17.0 g/dL 11.2(L) 11.4(L) 11.9(L)  Hematocrit 39.0 - 52.0 % 34.3(L) 33.4(L) 35.0(L)  Platelets 150 - 400 K/uL 142(L) PLATELET CLUMPS NOTED ON SMEAR, UNABLE TO ESTIMATE -    . CMP Latest Ref Rng &  Units 05/11/2021 05/27/2021 05/11/2021  Glucose 70 - 99 mg/dL 45(L) - 83  BUN 6 - 20 mg/dL 24(H) - 20  Creatinine 0.61 - 1.24 mg/dL 2.01(H) - 1.38(H)  Sodium 135 - 145 mmol/L 127(L) 123(L) 124(L)  Potassium 3.5 - 5.1 mmol/L 5.0 4.6 3.8  Chloride 98 - 111 mmol/L 99 - 95(L)  CO2 22 - 32 mmol/L 13(L) - 17(L)  Calcium 8.9 - 10.3 mg/dL 8.6(L) - 8.7(L)  Total Protein 6.5 - 8.1 g/dL 5.0(L) - 5.4(L)  Total Bilirubin 0.3 - 1.2 mg/dL 15.3(H) - 13.2(H)  Alkaline Phos 38 - 126 U/L 194(H) - 215(H)  AST 15 - 41 U/L 430(H) - 122(H)  ALT 0 - 44 U/L 228(H) - 91(H)   Studies/Results: DG Abd 1 View  Result Date: 05/29/2021 CLINICAL DATA:  NG tube placement EXAM: ABDOMEN - 1 VIEW COMPARISON:  09/18/2010 FINDINGS: 1118 hours. NG tube tip is in the mid stomach with proximal side port at the level of the GE junction. Endotracheal tube tip is seen 2.2 cm above the base of the carina. Central line tip overlies the distal SVC level. Cluster of stones in the right upper quadrant are consistent with gallstones seen on the 05/09/2021 CT scan. Diffuse gaseous bowel distention noted in the upper abdomen. IMPRESSION: 1. NG tube tip is in the mid stomach with proximal side port at the level of the GE junction. Tube could be advanced 5-6 cm to place the side port below the GE junction, as clinically warranted. 2. Diffuse gaseous bowel distention. 3. Cholelithiasis. Electronically Signed   By: Misty Stanley M.D.   On: 05/22/2021 11:57   CT HEAD WO CONTRAST (5MM)  Result Date: 05/18/2021 CLINICAL DATA:  Delirium; Neck trauma, intoxicated or obtunded (Age >= 16y) EXAM: CT HEAD WITHOUT CONTRAST CT CERVICAL SPINE WITHOUT CONTRAST TECHNIQUE: Multidetector CT imaging of the head and cervical spine was performed following the standard protocol without intravenous contrast. Multiplanar CT image reconstructions of the cervical spine were also generated. COMPARISON:  July 21, 2019. FINDINGS: CT HEAD FINDINGS Brain: No evidence of acute  infarction, hemorrhage, hydrocephalus, extra-axial collection or mass lesion/mass effect. Chronic similar brainstem atrophy. Mild patchy white matter hypoattenuation, nonspecific but compatible with chronic microvascular ischemic disease. Vascular: No hyperdense vessel identified. Skull: No acute fracture. Sinuses/Orbits: Visualized sinuses are clear. No acute orbital findings. Other: No mastoid effusions. CT CERVICAL SPINE FINDINGS Alignment: Similar slight anterolisthesis of C4 on C5 and C7 on T1. Otherwise, no substantial sagittal subluxation. Rotation of C1 on C2. Skull base and vertebrae: Similar vertebral body heights. Evidence of acute fracture. Soft tissues and spinal canal: No prevertebral fluid or swelling. No visible canal hematoma. Disc levels: Severe facet arthropathy on the left at C2-C3 and C3-C4. Severe degenerative disease at C3-C4 and moderate to severe degenerative disc disease at C5-C6 and C6-C7. No evidence of high-grade bony canal stenosis. Upper chest: Clear visualized lung apices. IMPRESSION: CT Head: 1. No evidence of acute intracranial abnormality. 2. Similar chronic findings. CT Cervical Spine: 1. No evidence of acute fracture. 2. Rotation of C1 on C2, likely positional in the absence of a fixed torticollis. 3. Multilevel severe degenerative change, as detailed above. Electronically Signed   By: Margaretha Sheffield M.D.   On: 05/13/2021 17:58   CT Cervical Spine Wo Contrast  Result Date: 05/07/2021 CLINICAL DATA:  Delirium; Neck trauma,  intoxicated or obtunded (Age >= 16y) EXAM: CT HEAD WITHOUT CONTRAST CT CERVICAL SPINE WITHOUT CONTRAST TECHNIQUE: Multidetector CT imaging of the head and cervical spine was performed following the standard protocol without intravenous contrast. Multiplanar CT image reconstructions of the cervical spine were also generated. COMPARISON:  July 21, 2019. FINDINGS: CT HEAD FINDINGS Brain: No evidence of acute infarction, hemorrhage, hydrocephalus,  extra-axial collection or mass lesion/mass effect. Chronic similar brainstem atrophy. Mild patchy white matter hypoattenuation, nonspecific but compatible with chronic microvascular ischemic disease. Vascular: No hyperdense vessel identified. Skull: No acute fracture. Sinuses/Orbits: Visualized sinuses are clear. No acute orbital findings. Other: No mastoid effusions. CT CERVICAL SPINE FINDINGS Alignment: Similar slight anterolisthesis of C4 on C5 and C7 on T1. Otherwise, no substantial sagittal subluxation. Rotation of C1 on C2. Skull base and vertebrae: Similar vertebral body heights. Evidence of acute fracture. Soft tissues and spinal canal: No prevertebral fluid or swelling. No visible canal hematoma. Disc levels: Severe facet arthropathy on the left at C2-C3 and C3-C4. Severe degenerative disease at C3-C4 and moderate to severe degenerative disc disease at C5-C6 and C6-C7. No evidence of high-grade bony canal stenosis. Upper chest: Clear visualized lung apices. IMPRESSION: CT Head: 1. No evidence of acute intracranial abnormality. 2. Similar chronic findings. CT Cervical Spine: 1. No evidence of acute fracture. 2. Rotation of C1 on C2, likely positional in the absence of a fixed torticollis. 3. Multilevel severe degenerative change, as detailed above. Electronically Signed   By: Margaretha Sheffield M.D.   On: 05/31/2021 17:58   DG CHEST PORT 1 VIEW  Result Date: 05/28/2021 CLINICAL DATA:  Intubation. EXAM: PORTABLE CHEST 1 VIEW COMPARISON:  Earlier same day FINDINGS: 1103 hours. Endotracheal tube tip is 2.1 cm above the base of the carina. Left IJ central line tip overlies the mid to distal SVC level. Patchy bilateral nodular and flame shaped airspace opacities are again noted. Low lung volumes. No edema or pleural effusion. The cardiopericardial silhouette is within normal limits for size. Telemetry leads overlie the chest. IMPRESSION: 1. Endotracheal tube tip is 2.1 cm above the base of the carina. 2.  Patchy bilateral nodular and flame shaped airspace disease, likely multifocal pneumonia. Electronically Signed   By: Misty Stanley M.D.   On: 05/14/2021 12:22   DG CHEST PORT 1 VIEW  Result Date: 06/02/2021 CLINICAL DATA:  Central line placement EXAM: PORTABLE CHEST 1 VIEW COMPARISON:  05/09/2021 FINDINGS: Left central line tip in the SVC. No pneumothorax. Patchy bilateral lower lobe opacities, new since prior study. Heart is normal size. No effusions or acute bony abnormality. IMPRESSION: Left central line tip in the SVC.  No pneumothorax. Patchy bilateral lower lobe opacities could reflect atelectasis or infiltrates. Electronically Signed   By: Rolm Baptise M.D.   On: 05/17/2021 10:25   EEG adult  Result Date: 05/25/2021 Lora Havens, MD     05/15/2021  9:09 AM Patient Name: Leshawn Kolesnik MRN: [id number] Epilepsy Attending: Lora Havens Referring Physician/Provider: Dr Derrick Ravel Date: 05/26/2021 Duration: 24.14 mins Patient history: 55 year old male with history of seizures, now with altered mental status.  EEG to evaluate for seizures. Level of alertness: Awake AEDs during EEG study: LEV Technical aspects: This EEG study was done with scalp electrodes positioned according to the 10-20 International system of electrode placement. Electrical activity was acquired at a sampling rate of '500Hz'$  and reviewed with a high frequency filter of '70Hz'$  and a low frequency filter of '1Hz'$ . EEG data were recorded continuously and digitally  stored. Description: No clear posterior dominant rhythm was seen.  EEG showed continuous generalized and lateralized right hemisphere 3 to 5 Hz theta-delta slowing with overriding 15 to 18 Hz beta activity in left hemisphere. Physiologic photic driving was not seen during photic stimulation.  Sharp transients were also noted in right temporal region.  Hyperventilation was not performed.   ABNORMALITY -Continued slow, generalized and lateralized right hemisphere  IMPRESSION: This study is suggestive of cortical dysfunction in right hemisphere likely secondary to underlying structural abnormality, postictal state. Additionally there is moderate diffuse encephalopathy, nonspecific etiology. No seizures or definite epileptiform discharges were seen throughout the recording. If concern for ictal-interictal activity persists, please consider long-term monitoring. [place]    Principal Problem:   Severe sepsis ([place]) Active Problems:   Seizures ([place])   Chronic liver disease and cirrhosis ([place])   History of alcohol abuse   Acute hepatic encephalopathy   Shock ([place])   Endotracheally intubated    Tye Savoy, NP-C @  05/23/2021, 12:30 PM

## 2021-05-21 ENCOUNTER — Inpatient Hospital Stay (HOSPITAL_COMMUNITY): Payer: 59

## 2021-05-21 DIAGNOSIS — K7200 Acute and subacute hepatic failure without coma: Secondary | ICD-10-CM | POA: Diagnosis not present

## 2021-05-21 DIAGNOSIS — A419 Sepsis, unspecified organism: Secondary | ICD-10-CM | POA: Diagnosis not present

## 2021-05-21 DIAGNOSIS — K767 Hepatorenal syndrome: Secondary | ICD-10-CM

## 2021-05-21 DIAGNOSIS — R569 Unspecified convulsions: Secondary | ICD-10-CM

## 2021-05-21 DIAGNOSIS — K7201 Acute and subacute hepatic failure with coma: Secondary | ICD-10-CM | POA: Diagnosis not present

## 2021-05-21 DIAGNOSIS — E872 Acidosis: Secondary | ICD-10-CM

## 2021-05-21 DIAGNOSIS — R571 Hypovolemic shock: Secondary | ICD-10-CM | POA: Diagnosis not present

## 2021-05-21 DIAGNOSIS — R6520 Severe sepsis without septic shock: Secondary | ICD-10-CM

## 2021-05-21 DIAGNOSIS — K7290 Hepatic failure, unspecified without coma: Secondary | ICD-10-CM | POA: Diagnosis not present

## 2021-05-21 LAB — CBC
HCT: 28.1 % — ABNORMAL LOW (ref 39.0–52.0)
HCT: 24.4 % — ABNORMAL LOW (ref 39.0–52.0)
Hemoglobin: 9.2 g/dL — ABNORMAL LOW (ref 13.0–17.0)
Hemoglobin: 8.1 g/dL — ABNORMAL LOW (ref 13.0–17.0)
MCH: 36.1 pg — ABNORMAL HIGH (ref 26.0–34.0)
MCH: 36.3 pg — ABNORMAL HIGH (ref 26.0–34.0)
MCHC: 32.7 g/dL (ref 30.0–36.0)
MCHC: 33.2 g/dL (ref 30.0–36.0)
MCV: 110.2 fL — ABNORMAL HIGH (ref 80.0–100.0)
MCV: 109.4 fL — ABNORMAL HIGH (ref 80.0–100.0)
Platelets: DECREASED 10*3/uL (ref 150–400)
Platelets: 39 10*3/uL — ABNORMAL LOW (ref 150–400)
RBC: 2.55 MIL/uL — ABNORMAL LOW (ref 4.22–5.81)
RBC: 2.23 MIL/uL — ABNORMAL LOW (ref 4.22–5.81)
RDW: 18.8 % — ABNORMAL HIGH (ref 11.5–15.5)
RDW: 19.5 % — ABNORMAL HIGH (ref 11.5–15.5)
WBC: 21.6 10*3/uL — ABNORMAL HIGH (ref 4.0–10.5)
WBC: 15.3 10*3/uL — ABNORMAL HIGH (ref 4.0–10.5)
nRBC: 0.1 % (ref 0.0–0.2)
nRBC: 0.1 % (ref 0.0–0.2)

## 2021-05-21 LAB — COMPREHENSIVE METABOLIC PANEL
ALT: 3272 U/L — ABNORMAL HIGH (ref 0–44)
ALT: 2724 U/L — ABNORMAL HIGH (ref 0–44)
AST: >10000 U/L — ABNORMAL HIGH (ref 15–41)
AST: 7272 U/L — ABNORMAL HIGH (ref 15–41)
Albumin: 2.7 g/dL — ABNORMAL LOW (ref 3.5–5.0)
Albumin: 2.9 g/dL — ABNORMAL LOW (ref 3.5–5.0)
Alkaline Phosphatase: 396 U/L — ABNORMAL HIGH (ref 38–126)
Alkaline Phosphatase: 417 U/L — ABNORMAL HIGH (ref 38–126)
Anion gap: 18 — ABNORMAL HIGH (ref 5–15)
Anion gap: 14 (ref 5–15)
BUN: 29 mg/dL — ABNORMAL HIGH (ref 6–20)
BUN: 33 mg/dL — ABNORMAL HIGH (ref 6–20)
CO2: 12 mmol/L — ABNORMAL LOW (ref 22–32)
CO2: 16 mmol/L — ABNORMAL LOW (ref 22–32)
Calcium: 8.5 mg/dL — ABNORMAL LOW (ref 8.9–10.3)
Calcium: 7.9 mg/dL — ABNORMAL LOW (ref 8.9–10.3)
Chloride: 97 mmol/L — ABNORMAL LOW (ref 98–111)
Chloride: 98 mmol/L (ref 98–111)
Creatinine, Ser: 2.25 mg/dL — ABNORMAL HIGH (ref 0.61–1.24)
Creatinine, Ser: 2.29 mg/dL — ABNORMAL HIGH (ref 0.61–1.24)
GFR, Estimated: 33 mL/min — ABNORMAL LOW (ref >60)
GFR, Estimated: 32 mL/min — ABNORMAL LOW (ref >60)
Glucose, Bld: 81 mg/dL (ref 70–99)
Glucose, Bld: 113 mg/dL — ABNORMAL HIGH (ref 70–99)
Potassium: 5.9 mmol/L — ABNORMAL HIGH (ref 3.5–5.1)
Potassium: 5.9 mmol/L — ABNORMAL HIGH (ref 3.5–5.1)
Sodium: 127 mmol/L — ABNORMAL LOW (ref 135–145)
Sodium: 128 mmol/L — ABNORMAL LOW (ref 135–145)
Total Bilirubin: 19.6 mg/dL (ref 0.3–1.2)
Total Bilirubin: 20 mg/dL (ref 0.3–1.2)
Total Protein: 4.9 g/dL — ABNORMAL LOW (ref 6.5–8.1)
Total Protein: 4.8 g/dL — ABNORMAL LOW (ref 6.5–8.1)

## 2021-05-21 LAB — CK: Total CK: 610 U/L — ABNORMAL HIGH (ref 49–397)

## 2021-05-21 LAB — POCT I-STAT 7, (LYTES, BLD GAS, ICA,H+H)
Acid-base deficit: 4 mmol/L — ABNORMAL HIGH (ref 0.0–2.0)
Bicarbonate: 19.8 mmol/L — ABNORMAL LOW (ref 20.0–28.0)
Calcium, Ion: 0.93 mmol/L — ABNORMAL LOW (ref 1.15–1.40)
HCT: 24 % — ABNORMAL LOW (ref 39.0–52.0)
Hemoglobin: 8.2 g/dL — ABNORMAL LOW (ref 13.0–17.0)
O2 Saturation: 99 %
Patient temperature: 98
Potassium: 5.3 mmol/L — ABNORMAL HIGH (ref 3.5–5.1)
Sodium: 130 mmol/L — ABNORMAL LOW (ref 135–145)
TCO2: 21 mmol/L — ABNORMAL LOW (ref 22–32)
pCO2 arterial: 27.9 mmHg — ABNORMAL LOW (ref 32.0–48.0)
pH, Arterial: 7.457 — ABNORMAL HIGH (ref 7.350–7.450)
pO2, Arterial: 109 mmHg — ABNORMAL HIGH (ref 83.0–108.0)

## 2021-05-21 LAB — GLUCOSE, CAPILLARY
Glucose-Capillary: 55 mg/dL — ABNORMAL LOW (ref 70–99)
Glucose-Capillary: 110 mg/dL — ABNORMAL HIGH (ref 70–99)
Glucose-Capillary: 85 mg/dL (ref 70–99)
Glucose-Capillary: 72 mg/dL (ref 70–99)
Glucose-Capillary: 105 mg/dL — ABNORMAL HIGH (ref 70–99)
Glucose-Capillary: 132 mg/dL — ABNORMAL HIGH (ref 70–99)
Glucose-Capillary: 144 mg/dL — ABNORMAL HIGH (ref 70–99)

## 2021-05-21 LAB — MAGNESIUM: Magnesium: 2.3 mg/dL (ref 1.7–2.4)

## 2021-05-21 LAB — BASIC METABOLIC PANEL
Anion gap: 17 — ABNORMAL HIGH (ref 5–15)
BUN: 33 mg/dL — ABNORMAL HIGH (ref 6–20)
CO2: 17 mmol/L — ABNORMAL LOW (ref 22–32)
Calcium: 8 mg/dL — ABNORMAL LOW (ref 8.9–10.3)
Chloride: 95 mmol/L — ABNORMAL LOW (ref 98–111)
Creatinine, Ser: 2.38 mg/dL — ABNORMAL HIGH (ref 0.61–1.24)
GFR, Estimated: 31 mL/min — ABNORMAL LOW (ref >60)
Glucose, Bld: 137 mg/dL — ABNORMAL HIGH (ref 70–99)
Potassium: 5.5 mmol/L — ABNORMAL HIGH (ref 3.5–5.1)
Sodium: 129 mmol/L — ABNORMAL LOW (ref 135–145)

## 2021-05-21 LAB — PROTIME-INR
INR: 8.1 (ref 0.8–1.2)
Prothrombin Time: 67.8 seconds — ABNORMAL HIGH (ref 11.4–15.2)

## 2021-05-21 LAB — PROCALCITONIN: Procalcitonin: 3.36 ng/mL

## 2021-05-21 LAB — PHOSPHORUS: Phosphorus: 7 mg/dL — ABNORMAL HIGH (ref 2.5–4.6)

## 2021-05-21 LAB — SODIUM, URINE, RANDOM: Sodium, Ur: <10 mmol/L

## 2021-05-21 LAB — FIBRINOGEN: Fibrinogen: 95 mg/dL — CL (ref 210–475)

## 2021-05-21 MED ORDER — DEXTROSE 50 % IV SOLN
50.0000 mL | Freq: Once | INTRAVENOUS | Status: AC
  Administered 2021-05-21: 50 mL via INTRAVENOUS

## 2021-05-21 MED ORDER — DEXTROSE 50 % IV SOLN
INTRAVENOUS | Status: AC
  Administered 2021-05-21: 50 mL
  Filled 2021-05-21: qty 50

## 2021-05-21 MED ORDER — SODIUM CHLORIDE 0.9% FLUSH
10.0000 mL | Freq: Two times a day (BID) | INTRAVENOUS | Status: DC
  Administered 2021-05-21 – 2021-05-22 (×3): 10 mL
  Administered 2021-05-23: 20 mL
  Administered 2021-05-23 – 2021-05-24 (×2): 10 mL

## 2021-05-21 MED ORDER — MIDAZOLAM HCL 2 MG/2ML IJ SOLN
2.0000 mg | INTRAMUSCULAR | Status: DC | PRN
  Administered 2021-05-21 – 2021-05-22 (×3): 1 mg via INTRAVENOUS
  Filled 2021-05-21 (×2): qty 2

## 2021-05-21 MED ORDER — SODIUM CHLORIDE 0.9 % IV SOLN
3.0000 g | Freq: Three times a day (TID) | INTRAVENOUS | Status: DC
  Administered 2021-05-21 – 2021-05-24 (×10): 3 g via INTRAVENOUS
  Filled 2021-05-21 (×10): qty 8

## 2021-05-21 MED ORDER — VITAMIN K1 10 MG/ML IJ SOLN
10.0000 mg | Freq: Once | INTRAVENOUS | Status: AC
  Administered 2021-05-21: 10 mg via INTRAVENOUS
  Filled 2021-05-21: qty 1

## 2021-05-21 MED ORDER — SODIUM BICARBONATE 8.4 % IV SOLN
100.0000 meq | Freq: Once | INTRAVENOUS | Status: AC
  Administered 2021-05-21: 100 meq via INTRAVENOUS
  Filled 2021-05-21: qty 50

## 2021-05-21 MED ORDER — SODIUM BICARBONATE 8.4 % IV SOLN
INTRAVENOUS | Status: DC
  Filled 2021-05-21 (×7): qty 1000

## 2021-05-21 MED ORDER — SODIUM CHLORIDE 0.9% FLUSH: 10.0000 mL | INTRAVENOUS | Status: DC | PRN

## 2021-05-21 MED ORDER — PROPOFOL 1000 MG/100ML IV EMUL: 5.0000 ug/kg/min | INTRAVENOUS | Status: DC

## 2021-05-21 NOTE — Progress Notes (Signed)
Pharmacy Antibiotic Note  [name] is a 55 y.o. male admitted on 06/01/2021 with  sepsis secondary to intra-abdominal infection  and possible aspiration PNA. Pharmacy has been consulted for unasyn dosing. Pt is afebrile and WBC is elevated at 21.6. SCr is trending up slightly at 2.25 and lactic acid remains elevated.   Plan: Unasyn 3g IV Q8H F/u renal fxn, C&S, clinical status   Height: '6\' 1"'$  (185.4 cm) Weight: 93.2 kg (205 lb 7.5 oz) IBW/kg (Calculated) : 79.9  Temp (24hrs), Avg:97.6 F (36.4 C), Min:96.8 F (36 C), Max:98.1 F (36.7 C)  Recent Labs  Lab 05/16/21 0243 05/17/21 0452 05/26/2021 1623 05/28/2021 1653 05/25/2021 1836 05/18/2021 0529 05/09/2021 1310 05/10/2021 1715 05/21/21 0320  WBC  --  10.5  --   --  17.3* 17.8* 26.4*  --  21.6*  CREATININE 0.71 0.59*  --  1.38*  --  2.01*  --   --  2.25*  LATICACIDVEN  --   --  5.3*  --  4.6* 10.0*  --  9.1*  --      Estimated Creatinine Clearance: 40.9 mL/min (A) (by C-G formula based on SCr of 2.25 mg/dL (H)).    Allergies  Allergen Reactions   Codeine Nausea And Vomiting and Other (See Comments)    Reports it makes me crazy.     Antimicrobials this admission: Ceftriaxone 9/15 x1 Flaygl 9/15 x1 Cefepime 9/15 >>9/16 Merrem 9/16 >>917 Unasyn 9/17>>  Dose adjustments this admission: None  Microbiology results: 9/16 Peritoneal fluid cx >>NGTD 9/15 BCx >>2/4 GPC (strep) 9/16 MRSA PCR negative  Thank you for allowing pharmacy to be a part of this patient's care.  Salome Arnt, PharmD, BCPS Clinical Pharmacist Please see AMION for all pharmacy numbers 05/21/2021 9:33 AM

## 2021-05-21 NOTE — Care Plan (Signed)
GOALS OF CARE DISCUSSION   The Clinical status was relayed to patient's brother and his wife at bedside  in detail.   Updated and notified of patients medical condition.     Patient remains unresponsive with multiple organ failure  Evidence of liver and kidney failure   Patient with Progressive multiorgan failure with a very high probablity clinical deterioration and death despite all aggressive and optimal medical therapy.  Patient's family decided to proceed with DNR, orders were written     Family are satisfied with Plan of action and management. All questions answered   Additional CC time 20 mins    Jacky Kindle MD [facility] See Amion for pager If no response to pager, please call [phone] until 7pm After 7pm, Please call E-link [phone]

## 2021-05-21 NOTE — Progress Notes (Addendum)
eLink Physician-Brief Progress Note Patient Name: [name] DOB: [birth year] MRN: [id number]   Date of Service  05/21/2021  HPI/Events of Note  INR up 10. GIB/cirrhosis-variceal bleeding. HRS. AGMA. DNR. [employer]e. Cr > 2. Elevated -shock liver.  In [facility].  eICU Interventions  - follow BMP, CBC and VBG.  On bicarb gtt - guarded prognosis.   Discussed with RN.      Intervention Category Intermediate Interventions: Bleeding - evaluation and treatment with blood products  Elmer Sow 05/21/2021, 7:37 PM  20:45 ABG. Metabolic acidosis and resp alkalosis.  Decrease bicarb gtt from 125 ml to 100 ml.hr, follow ABG in AM.  CMP, CBC reviewed. K at 5.5, Hg 8.1. follow AM labs.   4:10 AM Platelet at 27. Stable anemia.  Continue care. Watch for further active bleeding.

## 2021-05-21 NOTE — Progress Notes (Signed)
Gastroenterology Inpatient Follow-up Note   PATIENT IDENTIFICATION  [name] is a 55 y.o. male with a pmh significant for cirrhosis secondary to [place] with resultant portal hypertension (manifested as prior esophageal varices, ascites, PSE), HCC (status post recent MWA, bipolar disorder, prior alcohol use disorder, hyperlipidemia.  The patient is admitted with shock, concern for upper GI bleeding, and now progressive acute on chronic liver failure. Hospital Day: 3  SUBJECTIVE  The patient remains intubated and sedated. Some decrease in overall pressor support from yesterday PM to this AM but remains on Levophed/Vasopressin/Dex. Remains on IV PPI and Octreotide. OGT with some muddy brown. Had a straight cath I/O this morning but no foley. I have reviewed the labs from this AM and he has progressive liver insufficiency and worsening clinical status. CTAP without contrast ordered to evaluate for intra-abdominal issues (post MWA is my biggest concern of potential nidus of septic shock physiology). His INR is >8 but no overt bleeding, FFP not needed. There have been no fevers overnight.   OBJECTIVE  Scheduled Inpatient Medications:   chlorhexidine gluconate (MEDLINE KIT)  15 mL Mouth Rinse BID   Chlorhexidine Gluconate Cloth  6 each Topical Daily   docusate  100 mg Per Tube BID   fentaNYL (SUBLIMAZE) injection  50 mcg Intravenous Once   hydrocortisone sod succinate (SOLU-CORTEF) inj  100 mg Intravenous Q12H   lactulose  30 g Per Tube TID   levETIRAcetam  1,500 mg Per Tube BID   mouth rinse  15 mL Mouth Rinse 10 times per day   midodrine  10 mg Per Tube Q8H   rifaximin  550 mg Per Tube BID   sodium chloride flush  10-40 mL Intracatheter Q12H   Continuous Inpatient Infusions:   sodium chloride 999 mL/hr at 05/28/2021 0951   albumin human Stopped (05/21/21 0445)   dexmedetomidine (PRECEDEX) IV infusion 1.2 mcg/kg/hr (05/21/21 0659)   fentaNYL infusion INTRAVENOUS 50 mcg/hr  (05/21/21 0600)   meropenem (MERREM) IV 2 g (05/25/2021 2039)   norepinephrine (LEVOPHED) Adult infusion 18 mcg/min (05/21/21 0732)   octreotide  (SANDOSTATIN)    IV infusion 50 mcg/hr (05/21/21 0600)   pantoprazole 8 mg/hr (05/21/21 0600)   phytonadione (VITAMIN K) IV     propofol (DIPRIVAN) infusion     vasopressin 0.04 Units/min (05/21/21 0729)   PRN Inpatient Medications: docusate, fentaNYL, midazolam, polyethylene glycol, sodium chloride flush   Physical Examination  Temp:  [96.8 F (36 C)-98.1 F (36.7 C)] 97.7 F (36.5 C) (09/17 0749) Pulse Rate:  [66-119] 76 (09/17 0750) Resp:  [15-39] 26 (09/17 0750) BP: (86-137)/(14-97) 110/39 (09/17 0600) SpO2:  [94 %-100 %] 100 % (09/17 0750) FiO2 (%):  [40 %-100 %] 40 % (09/17 0750) Weight:  [93.2 kg] 93.2 kg (09/17 0500) Temp (24hrs), Avg:97.6 F (36.4 C), Min:96.8 F (36 C), Max:98.1 F (36.7 C)  Weight: 93.2 kg GEN: Intubated  PSYCH: Sedated EYE: Icteric sclerae ENT: Dry mucous membranes, OG tube with lightish brown discoloration but no bright red blood NECK: Central line in place CV: Tachycardic RESP: Ventilator sounds present GI: Protuberant abdomen, distended, hypoactive bowel sounds GU: Per report no melena MSK/EXT: Lower extremity edema bilaterally SKIN: Deeply jaundiced, spider angiomata present NEURO: Unable to assess as patient is intubated and sedated, I do not elicit clonus   Review of Data   Laboratory Studies   Recent Labs  Lab 05/17/21 0452 05/18/2021 1653 05/21/21 0320  NA 124*   < > 127*  K 3.7   < >  5.9*  CL 95*   < > 97*  CO2 22   < > 12*  BUN 10   < > 29*  CREATININE 0.59*   < > 2.25*  GLUCOSE 96   < > 81  CALCIUM 8.5*   < > 8.5*  MG 1.4*  --  2.3  PHOS 2.6  --  7.0*   < > = values in this interval not displayed.   Recent Labs  Lab 05/21/21 0320  AST >10,000*  ALT 3,272*  ALKPHOS 396*    Recent Labs  Lab 05/30/2021 0529 05/09/2021 1310 05/12/2021 1452 05/21/21 0320  WBC 17.8* 26.4*  --   21.6*  HGB 11.2* 10.9*   < > 9.2*  HCT 34.3* 33.4*   < > 28.1*  PLT 142* 136*  --  PLATELET CLUMPS NOTED ON SMEAR, COUNT APPEARS DECREASED   < > = values in this interval not displayed.   Recent Labs  Lab 05/06/2021 0529 05/21/21 0320  APTT 38*  --   INR 3.1* 8.1*   MELD-Na score: 46 at 05/21/2021  3:20 AM MELD score: 49 at 05/21/2021  3:20 AM Calculated from: Serum Creatinine: 2.25 mg/dL at 05/21/2021  3:20 AM Serum Sodium: 127 mmol/L at 05/21/2021  3:20 AM Total Bilirubin: 19.6 mg/dL at 05/21/2021  3:20 AM INR(ratio): 8.1 at 05/21/2021  3:20 AM Age: 29 years  Imaging Studies  DG Abd 1 View  Result Date: 05/22/2021 CLINICAL DATA:  NG tube placement EXAM: ABDOMEN - 1 VIEW COMPARISON:  09/18/2010 FINDINGS: 1118 hours. NG tube tip is in the mid stomach with proximal side port at the level of the GE junction. Endotracheal tube tip is seen 2.2 cm above the base of the carina. Central line tip overlies the distal SVC level. Cluster of stones in the right upper quadrant are consistent with gallstones seen on the 05/09/2021 CT scan. Diffuse gaseous bowel distention noted in the upper abdomen. IMPRESSION: 1. NG tube tip is in the mid stomach with proximal side port at the level of the GE junction. Tube could be advanced 5-6 cm to place the side port below the GE junction, as clinically warranted. 2. Diffuse gaseous bowel distention. 3. Cholelithiasis. Electronically Signed   By: Misty Stanley M.D.   On: 05/22/2021 11:57   CT HEAD WO CONTRAST (5MM)  Result Date: 05/14/2021 CLINICAL DATA:  Delirium; Neck trauma, intoxicated or obtunded (Age >= 16y) EXAM: CT HEAD WITHOUT CONTRAST CT CERVICAL SPINE WITHOUT CONTRAST TECHNIQUE: Multidetector CT imaging of the head and cervical spine was performed following the standard protocol without intravenous contrast. Multiplanar CT image reconstructions of the cervical spine were also generated. COMPARISON:  July 21, 2019. FINDINGS: CT HEAD FINDINGS Brain: No  evidence of acute infarction, hemorrhage, hydrocephalus, extra-axial collection or mass lesion/mass effect. Chronic similar brainstem atrophy. Mild patchy white matter hypoattenuation, nonspecific but compatible with chronic microvascular ischemic disease. Vascular: No hyperdense vessel identified. Skull: No acute fracture. Sinuses/Orbits: Visualized sinuses are clear. No acute orbital findings. Other: No mastoid effusions. CT CERVICAL SPINE FINDINGS Alignment: Similar slight anterolisthesis of C4 on C5 and C7 on T1. Otherwise, no substantial sagittal subluxation. Rotation of C1 on C2. Skull base and vertebrae: Similar vertebral body heights. Evidence of acute fracture. Soft tissues and spinal canal: No prevertebral fluid or swelling. No visible canal hematoma. Disc levels: Severe facet arthropathy on the left at C2-C3 and C3-C4. Severe degenerative disease at C3-C4 and moderate to severe degenerative disc disease at C5-C6 and C6-C7. No evidence  of high-grade bony canal stenosis. Upper chest: Clear visualized lung apices. IMPRESSION: CT Head: 1. No evidence of acute intracranial abnormality. 2. Similar chronic findings. CT Cervical Spine: 1. No evidence of acute fracture. 2. Rotation of C1 on C2, likely positional in the absence of a fixed torticollis. 3. Multilevel severe degenerative change, as detailed above. Electronically Signed   By: Margaretha Sheffield M.D.   On: 05/14/2021 17:58   CT Cervical Spine Wo Contrast  Result Date: 06/01/2021 CLINICAL DATA:  Delirium; Neck trauma, intoxicated or obtunded (Age >= 16y) EXAM: CT HEAD WITHOUT CONTRAST CT CERVICAL SPINE WITHOUT CONTRAST TECHNIQUE: Multidetector CT imaging of the head and cervical spine was performed following the standard protocol without intravenous contrast. Multiplanar CT image reconstructions of the cervical spine were also generated. COMPARISON:  July 21, 2019. FINDINGS: CT HEAD FINDINGS Brain: No evidence of acute infarction, hemorrhage,  hydrocephalus, extra-axial collection or mass lesion/mass effect. Chronic similar brainstem atrophy. Mild patchy white matter hypoattenuation, nonspecific but compatible with chronic microvascular ischemic disease. Vascular: No hyperdense vessel identified. Skull: No acute fracture. Sinuses/Orbits: Visualized sinuses are clear. No acute orbital findings. Other: No mastoid effusions. CT CERVICAL SPINE FINDINGS Alignment: Similar slight anterolisthesis of C4 on C5 and C7 on T1. Otherwise, no substantial sagittal subluxation. Rotation of C1 on C2. Skull base and vertebrae: Similar vertebral body heights. Evidence of acute fracture. Soft tissues and spinal canal: No prevertebral fluid or swelling. No visible canal hematoma. Disc levels: Severe facet arthropathy on the left at C2-C3 and C3-C4. Severe degenerative disease at C3-C4 and moderate to severe degenerative disc disease at C5-C6 and C6-C7. No evidence of high-grade bony canal stenosis. Upper chest: Clear visualized lung apices. IMPRESSION: CT Head: 1. No evidence of acute intracranial abnormality. 2. Similar chronic findings. CT Cervical Spine: 1. No evidence of acute fracture. 2. Rotation of C1 on C2, likely positional in the absence of a fixed torticollis. 3. Multilevel severe degenerative change, as detailed above. Electronically Signed   By: Margaretha Sheffield M.D.   On: 05/25/2021 17:58   DG CHEST PORT 1 VIEW  Result Date: 05/12/2021 CLINICAL DATA:  Central line placement EXAM: PORTABLE CHEST 1 VIEW COMPARISON:  05/07/2021, 11:03 a.m. FINDINGS: Interval placement of right upper extremity PICC, tip positioned over the lower superior vena cava. Interval placement of esophagogastric tube, tip and side port below the diaphragm. Otherwise unchanged examination, support apparatus including endotracheal tube and left neck vascular catheter. Bandlike atelectasis or consolidation of the bilateral mid lungs. The heart and mediastinum are unremarkable. IMPRESSION:  1. Interval placement of right upper extremity PICC, tip positioned over the lower superior vena cava. 2. Interval placement of esophagogastric tube, tip and side port below the diaphragm. 3. Otherwise unchanged examination, support apparatus including endotracheal tube and left neck vascular catheter. Electronically Signed   By: Eddie Candle M.D.   On: 05/18/2021 16:35   DG CHEST PORT 1 VIEW  Result Date: 05/27/2021 CLINICAL DATA:  Intubation. EXAM: PORTABLE CHEST 1 VIEW COMPARISON:  Earlier same day FINDINGS: 1103 hours. Endotracheal tube tip is 2.1 cm above the base of the carina. Left IJ central line tip overlies the mid to distal SVC level. Patchy bilateral nodular and flame shaped airspace opacities are again noted. Low lung volumes. No edema or pleural effusion. The cardiopericardial silhouette is within normal limits for size. Telemetry leads overlie the chest. IMPRESSION: 1. Endotracheal tube tip is 2.1 cm above the base of the carina. 2. Patchy bilateral nodular and flame shaped airspace disease,  likely multifocal pneumonia. Electronically Signed   By: Misty Stanley M.D.   On: 05/26/2021 12:22   DG CHEST PORT 1 VIEW  Result Date: 05/27/2021 CLINICAL DATA:  Central line placement EXAM: PORTABLE CHEST 1 VIEW COMPARISON:  05/09/2021 FINDINGS: Left central line tip in the SVC. No pneumothorax. Patchy bilateral lower lobe opacities, new since prior study. Heart is normal size. No effusions or acute bony abnormality. IMPRESSION: Left central line tip in the SVC.  No pneumothorax. Patchy bilateral lower lobe opacities could reflect atelectasis or infiltrates. Electronically Signed   By: Rolm Baptise M.D.   On: 05/18/2021 10:25   EEG adult  Result Date: 05/23/2021 Lora Havens, MD     05/11/2021  9:09 AM Patient Name: Owen Pratte MRN: [id number] Epilepsy Attending: Lora Havens Referring Physician/Provider: Dr Derrick Ravel Date: 05/30/2021 Duration: 24.14 mins Patient history:  55 year old male with history of seizures, now with altered mental status.  EEG to evaluate for seizures. Level of alertness: Awake AEDs during EEG study: LEV Technical aspects: This EEG study was done with scalp electrodes positioned according to the 10-20 International system of electrode placement. Electrical activity was acquired at a sampling rate of 500Hz  and reviewed with a high frequency filter of 70Hz  and a low frequency filter of 1Hz . EEG data were recorded continuously and digitally stored. Description: No clear posterior dominant rhythm was seen.  EEG showed continuous generalized and lateralized right hemisphere 3 to 5 Hz theta-delta slowing with overriding 15 to 18 Hz beta activity in left hemisphere. Physiologic photic driving was not seen during photic stimulation.  Sharp transients were also noted in right temporal region.  Hyperventilation was not performed.   ABNORMALITY -Continued slow, generalized and lateralized right hemisphere IMPRESSION: This study is suggestive of cortical dysfunction in right hemisphere likely secondary to underlying structural abnormality, postictal state. Additionally there is moderate diffuse encephalopathy, nonspecific etiology. No seizures or definite epileptiform discharges were seen throughout the recording. If concern for ictal-interictal activity persists, please consider long-term monitoring. Lora Havens   ECHOCARDIOGRAM COMPLETE  Result Date: 05/07/2021    ECHOCARDIOGRAM REPORT   Patient Name:   DEMONE LYLES Bessire Date of Exam: 06/03/2021 Medical Rec #:  [id number]          Height:       73.0 in Accession #:    [id number]         Weight:       195.0 lb Date of Birth:  1965/10/18          BSA:          2.129 m Patient Age:    3 years           BP:           125/49 mmHg Patient Gender: M                  HR:           114 bpm. Exam Location:  Inpatient Procedure: 2D Echo, Cardiac Doppler and Color Doppler Indications:    R00.8  History:        Patient has  no prior history of Echocardiogram examinations.                 Signs/Symptoms:Chest Pain.  Sonographer:    MH Referring Phys: [place]  1. Left ventricular ejection fraction, by estimation, is 70 to 75%. The left ventricle has hyperdynamic function. The left ventricle has no  regional wall motion abnormalities. There is mild concentric left ventricular hypertrophy. Left ventricular diastolic parameters were normal.  2. Right ventricular systolic function is hyperdynamic. The right ventricular size is normal.  3. The mitral valve is normal in structure. No evidence of mitral valve regurgitation.  4. The aortic valve is tricuspid. Aortic valve regurgitation is not visualized. No aortic stenosis is present. Increased gradient related to hyperdynamic LV function. Comparison(s): No prior Echocardiogram. FINDINGS  Left Ventricle: Left ventricular ejection fraction, by estimation, is 70 to 75%. The left ventricle has hyperdynamic function. The left ventricle has no regional wall motion abnormalities. The left ventricular internal cavity size was small. There is mild concentric left ventricular hypertrophy. Left ventricular diastolic parameters were normal. Right Ventricle: The right ventricular size is normal. No increase in right ventricular wall thickness. Right ventricular systolic function is hyperdynamic. Left Atrium: Left atrial size was normal in size. Right Atrium: Right atrial size was normal in size. Pericardium: There is no evidence of pericardial effusion. Mitral Valve: The mitral valve is normal in structure. No evidence of mitral valve regurgitation. Tricuspid Valve: The tricuspid valve is grossly normal. Tricuspid valve regurgitation is trivial. No evidence of tricuspid stenosis. Aortic Valve: The aortic valve is tricuspid. Aortic valve regurgitation is not visualized. No aortic stenosis is present. Aortic valve area, by VTI measures 4.50 cm. Pulmonic Valve: The pulmonic valve was not  well visualized. Pulmonic valve regurgitation is not visualized. No evidence of pulmonic stenosis. Aorta: The aortic root and ascending aorta are structurally normal, with no evidence of dilitation. IAS/Shunts: The interatrial septum was not well visualized.  LEFT VENTRICLE PLAX 2D LVIDd:         2.40 cm     Diastology LVIDs:         1.70 cm     LV e' medial:    11.00 cm/s LV PW:         1.20 cm     LV E/e' medial:  7.3 LV IVS:        1.30 cm     LV e' lateral:   15.30 cm/s LVOT diam:     2.30 cm     LV E/e' lateral: 5.2 LV SV:         169 LV SV Index:   79 LVOT Area:     4.15 cm  LV Volumes (MOD) LV vol d, MOD A4C: 43.9 ml LV vol s, MOD A4C: 18.4 ml LV SV MOD A4C:     43.9 ml RIGHT VENTRICLE RV S prime:     24.10 cm/s TAPSE (M-mode): 2.6 cm LEFT ATRIUM             Index       RIGHT ATRIUM           Index LA diam:        2.70 cm 1.27 cm/m  RA Area:     15.70 cm LA Vol (A2C):   62.7 ml 29.46 ml/m RA Volume:   40.20 ml  18.89 ml/m LA Vol (A4C):   51.8 ml 24.34 ml/m LA Biplane Vol: 61.5 ml 28.89 ml/m  AORTIC VALVE                    PULMONIC VALVE AV Area (Vmean):   3.43 cm     PV Vmax:       1.46 m/s AV Area (VTI):     4.50 cm     PV Peak grad:  8.5 mmHg  AV Vmean:          190.000 cm/s AV VTI:            0.376 m LVOT Vmax:         238.00 cm/s LVOT Vmean:        157.000 cm/s LVOT VTI:          0.407 m LVOT/AV VTI ratio: 1.08  AORTA Ao Root diam: 2.80 cm Ao Asc diam:  3.00 cm MITRAL VALVE               TRICUSPID VALVE MV Area (PHT): 4.40 cm    TR Peak grad:   17.0 mmHg MV E velocity: 79.80 cm/s  TR Vmax:        206.00 cm/s MV A velocity: 87.90 cm/s MV E/A ratio:  0.91        SHUNTS                            Systemic VTI:  0.41 m                            Systemic Diam: 2.30 cm Rudean Haskell MD Electronically signed by Rudean Haskell MD Signature Date/Time: 06/02/2021/1:18:03 PM    Final     GI Procedures and Studies  No new relevant studies to review   ASSESSMENT  [name] is a 55 y.o.  male with a pmh significant for cirrhosis secondary to [place] with resultant portal hypertension (manifested as prior esophageal varices, ascites, PSE), HCC (status post recent MWA, bipolar disorder, prior alcohol use disorder, hyperlipidemia.  The patient is admitted with shock, concern for upper GI bleeding, and now progressive acute on chronic liver failure.  The patient has decompensated from a lab perspective significantly from last night into this morning.  Leukocytosis is improved but his liver biochemical tests show evidence of shock physiology and his INR has increased to greater than 8.1.  No overt bleeding is occurring at this time thankfully but he is at risk of mucosal oozing that can occur at any time point with an INR of this elevation.  It is not clear if FFP is in his list of potentially transfusion based products that he may except but that will have to be discussed with the patient's brother further.  INR needs to be corrected if possible with IV vitamin K another 10 mg today.  His FFP is less than 100 and if the patient's is able to receive cryoprecipitate it may be worth giving units today to try and minimize risk of bleeding.  The decompensation of him overall is not completely defined but I am worried that the area of his microwave ablation may be a nidus for infection as he has no evidence of SBP and did not have evidence of pneumonia or upper respiratory infection.  The team has ordered a noncontrasted CT due to his progressive renal dysfunction and I think that needs to be performed as soon as able to further define if there are other issues.  Without contrast we could miss issues and thus MRI imaging would be more ideal but I am not sure he is stable for an MRI.  His progressive acute on chronic renal insufficiency is concerning for ATN but in the setting of his low urine sodium becomes more concerning for HRS physiology.  I recommend that the patient receive a total of 100 g  of  albumin today to see if this will make any difference for his kidney function.  He needs a Foley catheter to be placed to monitor I's and O's closely.  If issues continue to worsen, nephrology consultation may be required.  Overall his clinical status is significantly guarded.  His prior notations in the chart suggest that he is not a liver transplant candidate.  However if he continues to worsen, the ICU team may want to reach out to [facility] to see if they would want the patient who had recent instrumentation and now with progressive acute on chronic liver failure to be transferred to their service.  I am postponing the tentative EGD for today as it is not clear his physiology is hemorrhagic shock based on where his labs are at this time.  This can be reassessed but he is at risk of mucosal bleeding again because of his elevated INR.  I have a high concern that this patient may not survive the next 48 hours.  His status is critical/guarded.   PLAN/RECOMMENDATIONS  Would administer on 9/17 100 g of albumin to give him today Place a Foley Monitor I/os closely Repeat electrolytes this afternoon LFTs should be rechecked this afternoon INR should be rechecked this afternoon If patient is willing to accept cryoprecipitate, recommend consideration of administering due to his fibrinogen of less than 100 Administer 10 mg IV vitamin K today and another 10 mg IV vitamin K tomorrow Continue antibiotics Follow-up CT abdomen/pelvis ordered Holding on EGD for now Continue octreotide drip Continue PPI drip The use of his albumin and norepinephrine is a treatment for potential HRS - Goal would be for MAP greater than 85 but he has a very wide pulse pressure so not sure if that will be possible but will defer this discussion to the ICU team If patient worsens, recommend consideration of discussion with [facility] critical care team to see if they would want to transfer patient that had recent instrumentation done by  their team at [facility] hepatology Status is critical to guarded for this patient   Dr. Lorenso Courier will be covering the [place] [facility] service the rest of the weekend.  Please page/call with questions or concerns.   Justice Britain, MD [facility] Office # [phone]    LOS: 2 days  Irving Copas  05/21/2021, 8:08 AM

## 2021-05-21 NOTE — Progress Notes (Signed)
Critical Labs: INR 8.1, Fibrinogen 95.   Other: potassium 5.9.  Notified eLink.

## 2021-05-21 NOTE — Progress Notes (Signed)
Patient transported to CT and back without complications. RN at bedside.  

## 2021-05-21 NOTE — Progress Notes (Signed)
NAME:  [name], MRN:  [id number], DOB:  November 17, 1965, LOS: 2 ADMISSION DATE:  05/25/2021, CONSULTATION DATE:  05/21/2021  REFERRING MD:  Hal Hope, CHIEF COMPLAINT: Hypotension  History of Present Illness:  55 year old man with known history of alcoholic cirrhosis and varices and seizure disorder recently discharged from the hospital 2 days ago for hepatic encephalopathy.  He states that he had a seizure and fell down the stairs.  He was found facedown in his closet brought in by EMS with GCS of 8 which improved.  Initially hypothermic to 96, initial labs showed WBC of 17 K, sodium of 124 which is chronically low and increase in creatinine from baseline 0.7-1.3 lipase was 70 total bilirubin slightly increased. Initial lactate was 5.3 and decreased to 4.6 ammonia level was 71. He was initially admitted by [facility] but then developed hypotension and peripheral Levophed was started and PCCM called for admission He complains of pain all over, denies fever chills heartburn micturition or cough  Significant Hospital Events: Including procedures, antibiotic start and stop dates in addition to other pertinent events   9/15 > Admitted to [facility].  9/16 > Hypotension requiring Levophed in the early AM. Transferred to ICU, intubated, started requiring vasopressors  Interim History / Subjective:   Patient remains on high-dose vasopressors LFTs significantly trended up >10k, with bilirubin 19 and INR of 8 Remain on full mechanical ventilatory support  Objective   Blood pressure (!) 110/39, pulse 76, temperature 97.7 F (36.5 C), temperature source Oral, resp. rate (!) 26, height '6\' 1"'$  (1.854 m), weight 93.2 kg, SpO2 100 %.    Vent Mode: PRVC FiO2 (%):  [40 %-100 %] 40 % Set Rate:  [26 bmp] 26 bmp Vt Set:  [630 mL] 630 mL PEEP:  [5 cmH20] 5 cmH20 Plateau Pressure:  [13 cmH20-15 cmH20] 15 cmH20   Intake/Output Summary (Last 24 hours) at 05/21/2021 1015 Last data filed at 05/21/2021  0900 Gross per 24 hour  Intake 3596.61 ml  Output 1955 ml  Net 1641.61 ml   Filed Weights   05/15/2021 1955 05/21/21 0500  Weight: 88.5 kg 93.2 kg   Examination: General: Critically ill-appearing male who appears older than stated age.  Orally intubated HENT: Scleral icterus with dry mucus membranes.  OGT and ETT in place Lungs: Coarse breathe sounds throughout, no wheezes or crackles Cardiovascular: Regular rate and rhythm. No murmurs.  Abdomen: Soft, distended, bowel sounds present Extremities: Trace pitting edema of the bilateral lower extremities. Extremely jaundiced.  Neuro: Sedated, not following commands, RASS -4 Resolved Hospital Problem list     Assessment & Plan:  Septic Shock due to streptococcal bacteremia Patient continued to remain on high-dose vasopressor, titrate Levophed with map goal 65 or systolic [id number] Meropenem switched to Unasyn SBP was ruled out Continue stress dose steroid Continue vasopressors with Levophed and vasopressin Continue IV albumin Follow-up culture and sensitivity data  Acute Hypoxic Respiratory Failure  Continue lung protective ventilation VAP measures  On Precedex and fentanyl for sedation RASS goal -2/-3  Shock liver LFTs trended up to over 10 K GI is following thank you Trend LFTs CT abdomen and pelvis without contrast pending  Acute Kidney Injury could be related to hepatorenal syndrome High anion gap metabolic acidosis due to high lactic acid level Hyperkalemia Continue octreotide and Levophed Continue Midodrine 10 mg TID Closely monitor serum creatinine Monitor intake and output Will place Foley catheter Started on IV bicarbonate infusion after 2 A of bicarbonate IV pushes Closely monitor serum  creatinine and electrolytes  Upper GI Bleed Hemoglobin remained stable, no signs of active bleeding for now Patient does have history of grade 1 esophageal varices Continue Protonix infusion with octreotide  Acute  metabolic/hepatic/septic encephalopathy  Continue lactulose and rifaximin Minimize sedation as possible  Decompensated Cirrhosis  Hepatocellular carcinoma s/p ablation recently at [facility] Closely monitor  Chronic Hyponatremia  Monitor serum sodium  Seizure History  Continue Keppra  Best Practice (right click and "Reselect all SmartList Selections" daily)   Diet/type: NPO DVT prophylaxis: SCDs GI prophylaxis: PPI Lines: Central line Foley: Yes still needed Code Status:  full code Last date of multidisciplinary goals of care discussion: Patient's brother would like to keep him DNR but he wants to speak with his other siblings and patient's sons.  For now he will remain full code  Labs   CBC: Recent Labs  Lab 05/15/21 0329 05/17/21 0452 05/16/2021 1726 05/21/2021 1836 06/02/2021 0529 05/26/2021 1310 05/22/2021 1452 05/21/21 0320  WBC 9.1 10.5  --  17.3* 17.8* 26.4*  --  21.6*  NEUTROABS 6.4  --   --  13.7*  --   --   --   --   HGB 11.0* 11.1*   < > 11.4* 11.2* 10.9* 11.6* 9.2*  HCT 32.9* 32.0*   < > 33.4* 34.3* 33.4* 34.0* 28.1*  MCV 102.5* 101.6*  --  105.4* 107.2* 108.4*  --  110.2*  PLT 70* 90*  --  PLATELET CLUMPS NOTED ON SMEAR, UNABLE TO ESTIMATE 142* 136*  --  PLATELET CLUMPS NOTED ON SMEAR, COUNT APPEARS DECREASED   < > = values in this interval not displayed.    Basic Metabolic Panel: Recent Labs  Lab 05/16/21 0243 05/17/21 0452 05/29/2021 1653 05/05/2021 1726 05/23/2021 0529 05/12/2021 1452 05/21/21 0320  NA 126* 124* 124* 123* 127* 127* 127*  K 3.4* 3.7 3.8 4.6 5.0 5.1 5.9*  CL 95* 95* 95*  --  99  --  97*  CO2 24 22 17*  --  13*  --  12*  GLUCOSE 123* 96 83  --  45*  --  81  BUN '13 10 20  '$ --  24*  --  29*  CREATININE 0.71 0.59* 1.38*  --  2.01*  --  2.25*  CALCIUM 8.4* 8.5* 8.7*  --  8.6*  --  8.5*  MG 1.6* 1.4*  --   --   --   --  2.3  PHOS  --  2.6  --   --   --   --  7.0*   GFR: Estimated Creatinine Clearance: 40.9 mL/min (A) (by C-G formula based on SCr of  2.25 mg/dL (H)). Recent Labs  Lab 05/29/2021 1623 05/11/2021 1836 05/10/2021 0529 05/09/2021 1310 05/17/2021 1715 05/21/21 0320  PROCALCITON  --   --  0.25  --   --  3.36  WBC  --  17.3* 17.8* 26.4*  --  21.6*  LATICACIDVEN 5.3* 4.6* 10.0*  --  9.1*  --     Liver Function Tests: Recent Labs  Lab 05/16/21 0243 05/23/2021 1653 05/09/2021 0529 05/21/21 0320  AST 92* 122* 430* >10,000*  ALT 81* 91* 228* 3,272*  ALKPHOS 198* 215* 194* 396*  BILITOT 7.2* 13.2* 15.3* 19.6*  PROT 4.9* 5.4* 5.0* 4.9*  ALBUMIN 1.8* 2.0* 1.9* 2.7*   Recent Labs  Lab 05/17/2021 1653  LIPASE 70*   Recent Labs  Lab 05/15/21 0329 05/16/21 0243 05/17/21 0819 05/21/2021 1653 05/18/2021 0529  AMMONIA 90* 83* 37* 71* 52*  ABG    Component Value Date/Time   PHART 7.362 05/15/2021 1452   PCO2ART 23.1 (L) 05/06/2021 1452   PO2ART 202 (H) 05/21/2021 1452   HCO3 13.3 (L) 05/13/2021 1452   TCO2 14 (L) 05/05/2021 1452   ACIDBASEDEF 11.0 (H) 05/14/2021 1452   O2SAT 100.0 05/12/2021 1452     Coagulation Profile: Recent Labs  Lab 05/22/2021 0529 05/21/21 0320  INR 3.1* 8.1*    Cardiac Enzymes: Recent Labs  Lab 05/08/2021 1653 05/21/2021 0529 05/21/21 0320  CKTOTAL 403* 389 610*    HbA1C: Hgb A1c MFr Bld  Date/Time Value Ref Range Status  01/07/2013 12:00 PM 4.4 (L) 4.6 - 6.5 % Final    Comment:    Glycemic Control Guidelines for People with Diabetes:Non Diabetic:  <6%Goal of Therapy: <7%Additional Action Suggested:  >8%     CBG: Recent Labs  Lab 05/12/2021 1953 05/21/2021 2328 05/21/21 0323 05/21/21 0411 05/21/21 0732  GLUCAP 119* 98 55* 110* 85    Total critical care time: 57 minutes  Performed by: [place] care time was exclusive of separately billable procedures and treating other patients.   Critical care was necessary to treat or prevent imminent or life-threatening deterioration.   Critical care was time spent personally by me on the following activities: development of  treatment plan with patient and/or surrogate as well as nursing, discussions with consultants, evaluation of patient's response to treatment, examination of patient, obtaining history from patient or surrogate, ordering and performing treatments and interventions, ordering and review of laboratory studies, ordering and review of radiographic studies, pulse oximetry and re-evaluation of patient's condition.   Jacky Kindle MD [facility] See Amion for pager If no response to pager, please call [phone] until 7pm After 7pm, Please call E-link [phone]

## 2021-05-21 NOTE — Progress Notes (Signed)
Critical Lab Value: Total Bili 19.6.   Notified eLink.

## 2021-05-21 NOTE — Progress Notes (Signed)
Hypoglycemic Event  CBG: 55  Treatment: D50 50 mL (25 gm)  Symptoms: Nervous/irritable  Follow-up CBG: Time:0411 CBG Result:110  Possible Reasons for Event: Inadequate meal intake and Unknown  Comments/MD notified: Hypoglycemic protocol initiated    Raliegh Ip

## 2021-05-21 NOTE — Progress Notes (Signed)
eLink Physician-Brief Progress Note Patient Name: [name] DOB: 12/24/1965 MRN: [id number]   Date of Service  05/21/2021  HPI/Events of Note  GFR worse, and liver enzymes are significantly worse  eICU Interventions  CT a/p without contrast ordered     Intervention Category Intermediate Interventions: Electrolyte abnormality - evaluation and management  Tilden Dome 05/21/2021, 5:58 AM

## 2021-05-21 NOTE — Progress Notes (Signed)
Critical Lab Result: INR 10  eLink notified.

## 2021-05-22 DIAGNOSIS — R579 Shock, unspecified: Secondary | ICD-10-CM

## 2021-05-22 DIAGNOSIS — K7460 Unspecified cirrhosis of liver: Secondary | ICD-10-CM

## 2021-05-22 DIAGNOSIS — A419 Sepsis, unspecified organism: Secondary | ICD-10-CM | POA: Diagnosis not present

## 2021-05-22 DIAGNOSIS — K7200 Acute and subacute hepatic failure without coma: Secondary | ICD-10-CM | POA: Diagnosis not present

## 2021-05-22 DIAGNOSIS — K769 Liver disease, unspecified: Secondary | ICD-10-CM

## 2021-05-22 DIAGNOSIS — R569 Unspecified convulsions: Secondary | ICD-10-CM | POA: Diagnosis not present

## 2021-05-22 LAB — POCT I-STAT 7, (LYTES, BLD GAS, ICA,H+H)
Acid-Base Excess: 0 mmol/L (ref 0.0–2.0)
Bicarbonate: 21.7 mmol/L (ref 20.0–28.0)
Calcium, Ion: 0.97 mmol/L — ABNORMAL LOW (ref 1.15–1.40)
HCT: 23 % — ABNORMAL LOW (ref 39.0–52.0)
Hemoglobin: 7.8 g/dL — ABNORMAL LOW (ref 13.0–17.0)
O2 Saturation: 99 %
Patient temperature: 97.5
Potassium: 4.5 mmol/L (ref 3.5–5.1)
Sodium: 132 mmol/L — ABNORMAL LOW (ref 135–145)
TCO2: 22 mmol/L (ref 22–32)
pCO2 arterial: 23.2 mmHg — ABNORMAL LOW (ref 32.0–48.0)
pH, Arterial: 7.578 — ABNORMAL HIGH (ref 7.350–7.450)
pO2, Arterial: 104 mmHg (ref 83.0–108.0)

## 2021-05-22 LAB — CBC
HCT: 24.1 % — ABNORMAL LOW (ref 39.0–52.0)
Hemoglobin: 8.2 g/dL — ABNORMAL LOW (ref 13.0–17.0)
MCH: 36.8 pg — ABNORMAL HIGH (ref 26.0–34.0)
MCHC: 34 g/dL (ref 30.0–36.0)
MCV: 108.1 fL — ABNORMAL HIGH (ref 80.0–100.0)
Platelets: 27 10*3/uL — CL (ref 150–400)
RBC: 2.23 MIL/uL — ABNORMAL LOW (ref 4.22–5.81)
RDW: 19.5 % — ABNORMAL HIGH (ref 11.5–15.5)
WBC: 12.1 10*3/uL — ABNORMAL HIGH (ref 4.0–10.5)
nRBC: 0.2 % (ref 0.0–0.2)

## 2021-05-22 LAB — GLUCOSE, CAPILLARY
Glucose-Capillary: 142 mg/dL — ABNORMAL HIGH (ref 70–99)
Glucose-Capillary: 146 mg/dL — ABNORMAL HIGH (ref 70–99)
Glucose-Capillary: 153 mg/dL — ABNORMAL HIGH (ref 70–99)
Glucose-Capillary: 151 mg/dL — ABNORMAL HIGH (ref 70–99)
Glucose-Capillary: 167 mg/dL — ABNORMAL HIGH (ref 70–99)

## 2021-05-22 LAB — PROTIME-INR
INR: 10 (ref 0.8–1.2)
Prothrombin Time: 79.9 seconds — ABNORMAL HIGH (ref 11.4–15.2)

## 2021-05-22 LAB — MAGNESIUM: Magnesium: 2.3 mg/dL (ref 1.7–2.4)

## 2021-05-22 LAB — PHOSPHORUS: Phosphorus: 5.5 mg/dL — ABNORMAL HIGH (ref 2.5–4.6)

## 2021-05-22 LAB — PROCALCITONIN: Procalcitonin: 0.71 ng/mL

## 2021-05-22 MED ORDER — PANTOPRAZOLE SODIUM 40 MG IV SOLR
40.0000 mg | Freq: Two times a day (BID) | INTRAVENOUS | Status: DC
  Administered 2021-05-22 – 2021-05-24 (×5): 40 mg via INTRAVENOUS
  Filled 2021-05-22 (×5): qty 40

## 2021-05-22 MED ORDER — LACTULOSE 10 GM/15ML PO SOLN
30.0000 g | Freq: Two times a day (BID) | ORAL | Status: DC
  Administered 2021-05-22 – 2021-05-23 (×2): 30 g
  Filled 2021-05-22 (×2): qty 45

## 2021-05-22 MED ORDER — VITAMIN K1 10 MG/ML IJ SOLN
10.0000 mg | Freq: Once | INTRAVENOUS | Status: AC
  Administered 2021-05-22: 10 mg via INTRAVENOUS
  Filled 2021-05-22: qty 1

## 2021-05-22 NOTE — Progress Notes (Signed)
Critical Lab: Platelets 27  eLink notified.

## 2021-05-22 NOTE — Progress Notes (Signed)
NAME:  [name], MRN:  [id number], DOB:  Jan 27, 1966, LOS: 3 ADMISSION DATE:  05/07/2021, CONSULTATION DATE:  05/22/2021  REFERRING MD:  Hal Hope, CHIEF COMPLAINT: Hypotension  History of Present Illness:  55 year old man with known history of alcoholic cirrhosis and varices and seizure disorder recently discharged from the hospital 2 days ago for hepatic encephalopathy.  He states that he had a seizure and fell down the stairs.  He was found facedown in his closet brought in by EMS with GCS of 8 which improved.  Initially hypothermic to 96, initial labs showed WBC of 17 K, sodium of 124 which is chronically low and increase in creatinine from baseline 0.7-1.3 lipase was 70 total bilirubin slightly increased. Initial lactate was 5.3 and decreased to 4.6 ammonia level was 71. He was initially admitted by [facility] but then developed hypotension and peripheral Levophed was started and PCCM called for admission  Significant Hospital Events: Including procedures, antibiotic start and stop dates in addition to other pertinent events   9/15 > Admitted to [facility].  9/16 > Hypotension requiring Levophed in the early AM. Transferred to ICU, intubated, started requiring vasopressors  Interim History / Subjective:  Vasopressor requirement is improving Currently off vasopressin, on low-dose Levophed Remain on full mechanical ventilatory support  Objective   Blood pressure (!) 119/51, pulse 72, temperature (!) 97 F (36.1 C), temperature source Oral, resp. rate 20, height '6\' 1"'$  (1.854 m), weight 99.2 kg, SpO2 100 %.    Vent Mode: PRVC FiO2 (%):  [40 %] 40 % Set Rate:  [20 bmp-26 bmp] 20 bmp Vt Set:  [560 mL] 560 mL PEEP:  [5 cmH20] 5 cmH20 Plateau Pressure:  [13 cmH20-16 cmH20] 13 cmH20   Intake/Output Summary (Last 24 hours) at 05/22/2021 1118 Last data filed at 05/22/2021 1044 Gross per 24 hour  Intake 5488.66 ml  Output 6895 ml  Net -1406.34 ml   Filed Weights   05/16/2021 1955  05/21/21 0500 05/22/21 0500  Weight: 88.5 kg 93.2 kg 99.2 kg   Examination: General: Critically ill-appearing male who appears older than stated age.  Orally intubated HENT: Scleral icterus with dry mucus membranes.  OGT and ETT in place Lungs: Coarse breathe sounds throughout, no wheezes or crackles Cardiovascular: Regular rate and rhythm. No murmurs.  Abdomen: Soft, distended, bowel sounds present Extremities: Trace pitting edema of the bilateral lower extremities. Extremely jaundiced.  Neuro: Sedated, not following commands, RASS -4 Resolved Hospital Problem list    Hyperkalemia Assessment & Plan:  Septic Shock due to Streptococcus mitis bacteremia Vasopressor requirement has improved Off vasopressin, currently on low-dose Levophed Continue Unasyn SBP was ruled out Continue stress dose steroid Received IV albumin Follow-up culture and sensitivity data  Acute Hypoxic Respiratory Failure  Continue lung protective ventilation VAP measures  On Precedex and fentanyl for sedation RASS goal -2/-3  Shock liver LFTs trended up over 10 K Now LFTs are trending down, with improvement in blood pressure GI is following Trend LFTs CT abdomen and pelvis without contrast showed persistent liver mass and portal hypertension with moderate ascites  Acute Kidney Injury could be related to hepatorenal syndrome High anion gap metabolic acidosis due to high lactic acid level Serum creatinine started trending down now its 2.1 Continue octreotide and Levophed Continue Midodrine 10 mg TID Closely monitor serum creatinine Monitor intake and output Continue bicarbonate infusion Closely monitor serum creatinine and electrolytes  Upper GI Bleed, likely due to esophageal/gastric varices Patient is started coffee-ground emesis via OG tube  He put out 700 cc yesterday Hemoglobin dropped 7.8 from 9.2 Patient is Jehovah witness Continue Protonix twice daily and octreotide infusion  Acute  metabolic/hepatic/septic encephalopathy  Continue lactulose and rifaximin Minimize sedation as possible  Decompensated Cirrhosis with ascites, portal hypertension and coagulopathy Hepatocellular carcinoma s/p ablation recently at [facility] Closely monitor His INR is 10 Continue vitamin K  Acute blood loss anemia Thrombocytopenia due to liver disease and septic shock Patient platelet count Down to 27 and hemoglobin has decreased to 7.8 Patient is a Jehovah witness Minimize lab draws  Chronic Hyponatremia  Monitor serum sodium  Seizure disorder Continue Keppra  Best Practice (right click and "Reselect all SmartList Selections" daily)   Diet/type: NPO DVT prophylaxis: SCDs GI prophylaxis: PPI Lines: Central line Foley: Yes still needed Code Status:  full code Last date of multidisciplinary goals of care discussion: 9/18, patient's brother spoke with his siblings and they decided to proceed with DNR.  Orders were written Labs   CBC: Recent Labs  Lab 05/31/2021 1836 05/25/2021 0529 06/01/2021 1310 05/11/2021 1452 05/21/21 0320 05/21/21 2021 05/21/21 2041 05/22/21 0309 05/22/21 0332  WBC 17.3* 17.8* 26.4*  --  21.6*  --  15.3* 12.1*  --   NEUTROABS 13.7*  --   --   --   --   --   --   --   --   HGB 11.4* 11.2* 10.9*   < > 9.2* 8.2* 8.1* 8.2* 7.8*  HCT 33.4* 34.3* 33.4*   < > 28.1* 24.0* 24.4* 24.1* 23.0*  MCV 105.4* 107.2* 108.4*  --  110.2*  --  109.4* 108.1*  --   PLT PLATELET CLUMPS NOTED ON SMEAR, UNABLE TO ESTIMATE 142* 136*  --  PLATELET CLUMPS NOTED ON SMEAR, COUNT APPEARS DECREASED  --  39* 27*  --    < > = values in this interval not displayed.    Basic Metabolic Panel: Recent Labs  Lab 05/16/21 0243 05/17/21 0452 05/06/2021 1653 05/30/2021 0529 05/16/2021 1452 05/21/21 0320 05/21/21 1419 05/21/21 2021 05/21/21 2041 05/22/21 0309 05/22/21 0332  NA 126* 124*   < > 127*   < > 127* 128* 130* 129* 132* 132*  K 3.4* 3.7   < > 5.0   < > 5.9* 5.9* 5.3* 5.5* 4.6 4.5  CL  95* 95*   < > 99  --  97* 98  --  95* 95*  --   CO2 24 22   < > 13*  --  12* 16*  --  17* 19*  --   GLUCOSE 123* 96   < > 45*  --  81 113*  --  137* 150*  --   BUN 13 10   < > 24*  --  29* 33*  --  33* 31*  --   CREATININE 0.71 0.59*   < > 2.01*  --  2.25* 2.29*  --  2.38* 2.13*  --   CALCIUM 8.4* 8.5*   < > 8.6*  --  8.5* 7.9*  --  8.0* 8.2*  --   MG 1.6* 1.4*  --   --   --  2.3  --   --   --  2.3  --   PHOS  --  2.6  --   --   --  7.0*  --   --   --  5.5*  --    < > = values in this interval not displayed.   GFR: Estimated Creatinine Clearance: 47.4 mL/min (  A) (by C-G formula based on SCr of 2.13 mg/dL (H)). Recent Labs  Lab 05/09/2021 1623 05/18/2021 1836 05/31/2021 0529 05/05/2021 1310 05/14/2021 1715 05/21/21 0320 05/21/21 2041 05/22/21 0309  PROCALCITON  --   --  0.25  --   --  3.36  --  0.71  WBC  --  17.3* 17.8* 26.4*  --  21.6* 15.3* 12.1*  LATICACIDVEN 5.3* 4.6* 10.0*  --  9.1*  --   --   --     Liver Function Tests: Recent Labs  Lab 05/08/2021 1653 05/28/2021 0529 05/21/21 0320 05/21/21 1419 05/22/21 0309  AST 122* 430* >10,000* 7,272* 4,361*  ALT 91* 228* 3,272* 2,724* 2,353*  ALKPHOS 215* 194* 396* 417* 415*  BILITOT 13.2* 15.3* 19.6* 20.0* 21.5*  PROT 5.4* 5.0* 4.9* 4.8* 5.2*  ALBUMIN 2.0* 1.9* 2.7* 2.9* 3.4*   Recent Labs  Lab 05/05/2021 1653  LIPASE 70*   Recent Labs  Lab 05/16/21 0243 05/17/21 0819 05/09/2021 1653 05/17/2021 0529  AMMONIA 83* 37* 71* 52*    ABG    Component Value Date/Time   PHART 7.578 (H) 05/22/2021 0332   PCO2ART 23.2 (L) 05/22/2021 0332   PO2ART 104 05/22/2021 0332   HCO3 21.7 05/22/2021 0332   TCO2 22 05/22/2021 0332   ACIDBASEDEF 4.0 (H) 05/21/2021 2021   O2SAT 99.0 05/22/2021 0332     Coagulation Profile: Recent Labs  Lab 05/17/2021 0529 05/21/21 0320 05/21/21 1419  INR 3.1* 8.1* 10.0*    Cardiac Enzymes: Recent Labs  Lab 05/07/2021 1653 05/22/2021 0529 05/21/21 0320  CKTOTAL 403* 389 610*    HbA1C: Hgb A1c MFr Bld   Date/Time Value Ref Range Status  01/07/2013 12:00 PM 4.4 (L) 4.6 - 6.5 % Final    Comment:    Glycemic Control Guidelines for People with Diabetes:Non Diabetic:  <6%Goal of Therapy: <7%Additional Action Suggested:  >8%     CBG: Recent Labs  Lab 05/21/21 1928 05/21/21 2323 05/22/21 0340 05/22/21 0720 05/22/21 1105  GLUCAP 132* 144* 142* 146* 153*    Total critical care time: 49 minutes  Performed by: Jacky Kindle   Critical care time was exclusive of separately billable procedures and treating other patients.   Critical care was necessary to treat or prevent imminent or life-threatening deterioration.   Critical care was time spent personally by me on the following activities: development of treatment plan with patient and/or surrogate as well as nursing, discussions with consultants, evaluation of patient's response to treatment, examination of patient, obtaining history from patient or surrogate, ordering and performing treatments and interventions, ordering and review of laboratory studies, ordering and review of radiographic studies, pulse oximetry and re-evaluation of patient's condition.   Jacky Kindle MD [facility] See Amion for pager If no response to pager, please call [phone] until 7pm After 7pm, Please call E-link [phone]

## 2021-05-22 NOTE — Progress Notes (Signed)
[facility] Progress Note Patient Name: [name] DOB: 1966/09/03 MRN: [id number]   Date of Service  05/22/2021  HPI/Events of Note  RN asking for restraints renewal. Encephalopathy/agitation.   eICU Interventions  Bilateral, non violent soft wrist restriants ordered to prevent self injury and harm from pullineg lines/tubes. renewed     Intervention Category Minor Interventions: Other:;Agitation / anxiety - evaluation and management;Routine modifications to care plan (e.g. PRN medications for pain, fever)  Elmer Sow 05/22/2021, 10:27 PM

## 2021-05-22 NOTE — Progress Notes (Signed)
Gastroenterology Inpatient Follow-up Note   PATIENT IDENTIFICATION  [name] is a 55 y.o. male with a pmh significant for cirrhosis secondary to [place] with resultant portal hypertension (manifested as prior esophageal varices, ascites, PSE), HCC (status post recent MWA, bipolar disorder, prior alcohol use disorder, hyperlipidemia.  The patient is admitted with shock, concern for upper GI bleeding, and now progressive acute on chronic liver failure. Hospital Day: 4  SUBJECTIVE  The patient remains intubated and sedated. Pressor requirements are decreasing Remains on IV PPI and Octreotide. OGT with some brown-dark red output  OBJECTIVE  Scheduled Inpatient Medications:   chlorhexidine gluconate (MEDLINE KIT)  15 mL Mouth Rinse BID   Chlorhexidine Gluconate Cloth  6 each Topical Daily   docusate  100 mg Per Tube BID   fentaNYL (SUBLIMAZE) injection  50 mcg Intravenous Once   hydrocortisone sod succinate (SOLU-CORTEF) inj  100 mg Intravenous Q12H   lactulose  30 g Per Tube BID   levETIRAcetam  1,500 mg Per Tube BID   mouth rinse  15 mL Mouth Rinse 10 times per day   midodrine  10 mg Per Tube Q8H   pantoprazole (PROTONIX) IV  40 mg Intravenous Q12H   rifaximin  550 mg Per Tube BID   sodium chloride flush  10-40 mL Intracatheter Q12H   Continuous Inpatient Infusions:   sodium chloride 999 mL/hr at 05/28/2021 0951   ampicillin-sulbactam (UNASYN) IV Stopped (05/22/21 1748)   dexmedetomidine (PRECEDEX) IV infusion 1 mcg/kg/hr (05/22/21 1827)   fentaNYL infusion INTRAVENOUS 50 mcg/hr (05/22/21 1827)   norepinephrine (LEVOPHED) Adult infusion 5 mcg/min (05/22/21 0800)   octreotide  (SANDOSTATIN)    IV infusion 50 mcg/hr (05/22/21 1827)   sodium bicarbonate 150 mEq in D5W infusion 100 mL/hr at 05/22/21 1827   PRN Inpatient Medications: docusate, fentaNYL, midazolam, polyethylene glycol, sodium chloride flush   Physical Examination  Temp:  [96.4 F (35.8 C)-97.5 F (36.4  C)] 97 F (36.1 C) (09/18 0700) Pulse Rate:  [51-90] 68 (09/18 1800) Resp:  [20-26] 20 (09/18 1800) BP: (107-141)/(48-89) 111/54 (09/18 1800) SpO2:  [100 %] 100 % (09/18 1800) FiO2 (%):  [40 %] 40 % (09/18 1539) Weight:  [99.2 kg] 99.2 kg (09/18 0500) Temp (24hrs), Avg:97 F (36.1 C), Min:96.4 F (35.8 C), Max:97.5 F (36.4 C)  Weight: 99.2 kg GEN: Intubated  PSYCH: Sedated EYE: Icteric sclerae ENT: Dry mucous membranes, OG tube with brown-dark red contents NECK: Central line in place CV: Regular rate RESP: Ventilator sounds present GI: Protuberant abdomen, distended, hypoactive bowel sounds MSK/EXT: Lower extremity edema bilaterally SKIN: Jaundiced NEURO: Sedated   Review of Data   Laboratory Studies   Recent Labs  Lab 05/17/21 0452 05/17/2021 1653 05/21/21 0320 05/21/21 1419 05/22/21 0309 05/22/21 0332  NA 124*   < > 127*   < > 132* 132*  K 3.7   < > 5.9*   < > 4.6 4.5  CL 95*   < > 97*   < > 95*  --   CO2 22   < > 12*   < > 19*  --   BUN 10   < > 29*   < > 31*  --   CREATININE 0.59*   < > 2.25*   < > 2.13*  --   GLUCOSE 96   < > 81   < > 150*  --   CALCIUM 8.5*   < > 8.5*   < > 8.2*  --   MG 1.4*  --  2.3  --  2.3  --   PHOS 2.6  --  7.0*  --  5.5*  --    < > = values in this interval not displayed.   Recent Labs  Lab 05/22/21 0309  AST 4,361*  ALT 2,353*  ALKPHOS 415*    Recent Labs  Lab 05/21/21 0320 05/21/21 2021 05/21/21 2041 05/22/21 0309 05/22/21 0332  WBC 21.6*  --  15.3* 12.1*  --   HGB 9.2*   < > 8.1* 8.2* 7.8*  HCT 28.1*   < > 24.4* 24.1* 23.0*  PLT PLATELET CLUMPS NOTED ON SMEAR, COUNT APPEARS DECREASED  --  39* 27*  --    < > = values in this interval not displayed.   Recent Labs  Lab 05/15/2021 0529 05/21/21 0320 05/21/21 1419  APTT 38*  --   --   INR 3.1* 8.1* 10.0*   MELD-Na score: 49 at 05/22/2021  3:32 AM MELD score: 51 at 05/22/2021  3:09 AM Calculated from: Serum Creatinine: 2.13 mg/dL at 05/22/2021  3:09 AM Serum  Sodium: 132 mmol/L at 05/22/2021  3:32 AM Total Bilirubin: 21.5 mg/dL at 05/22/2021  3:09 AM INR(ratio): 10.0 at 05/21/2021  2:19 PM Age: 83 years  Imaging Studies  CT ABDOMEN PELVIS WO CONTRAST  Result Date: 05/21/2021 CLINICAL DATA:  Abdominal distension. EXAM: CT ABDOMEN AND PELVIS WITHOUT CONTRAST TECHNIQUE: Multidetector CT imaging of the abdomen and pelvis was performed following the standard protocol without IV contrast. COMPARISON:  Chest radiograph, earlier same day. CT abdomen pelvis, 05/09/2021 and 12/24/2014. FINDINGS: Lower chest: Bibasilar and dependent pulmonary opacities, likely consistent with atelectasis. Central venous catheter with tip terminating at the superior cavoatrial junction. Hepatobiliary: Contacted, dense appearance of the liver with gross nodule contour consistent with cirrhosis. Nodule, exophytic RIGHT hepatic lobe mass consistent with known hepatoma is poorly evaluated. Radiodense gallstones within the nondistended gallbladder. No intra-or extrahepatic biliary dilation. Pancreas: Normal noncontrast appearance. Spleen: Splenomegaly, measuring up to 15 cm. Splenic artery calcifications. Massive splenorenal shunt Adrenals/Urinary Tract: RIGHT adrenal gland is unremarkable. LEFT adrenal gland is obscured, and best appreciated on recent contrasted CT comparison. Kidneys are normal, without renal calculi, focal lesion, or hydronephrosis. Bladder is unremarkable. Stomach/Bowel: *Enteric feeding tube with tip within stomach. Radiodense layering contrast within stomach. No extraluminal extravasation. *Punctate radiodensities scattered within bowel, likely ingested. *Mild distal small bowel and cecal/proximal colonic wall thickening, this is significantly improved since 05/09/2021 comparison *Nonobstructed small bowel.  Nondilated colon. Vascular/Lymphatic: No significant vascular findings are present. No enlarged abdominal or pelvic lymph nodes. Reproductive: Prostate is unremarkable.   LEFT ascitic hydrocele. Other: A small-to-moderate volume of intra-ascites is present. Musculoskeletal: Subcutaneous edema, likely secondary to fluid overload and/or resuscitation. LEFT hip arthroplasty. Multilevel degenerative changes spine, greatest within the lower lumbar spine. No interval osseous abnormality. IMPRESSION: 1. Nonobstructed bowel, with mild distal small bowel and proximal colonic wall thickening. Findings are much improved since recent comparison 05/09/2021, and likely to represent mild portal enteropathy/colopathy in the setting of cirrhosis. 2. Small to moderate volume intra-abdominal ascites. 3. Cirrhosis with liver mass, consistent with known HCC, and evidence of portal hypertension. Additional chronic and senescent changes, as above. Electronically Signed   By: Michaelle Birks M.D.   On: 05/21/2021 12:44   DG Chest Port 1 View  Result Date: 05/21/2021 CLINICAL DATA:  Shortness of breath.  Altered mental status. EXAM: PORTABLE CHEST 1 VIEW COMPARISON:  May 20, 2021 FINDINGS: The ET tube, NG tube, right central line, and left central  line are stable and in good position. A right PICC line continues to terminate in the right upper extremity, stable. No pneumothorax. Bibasilar opacities remain, platelike in appearance. No other interval changes. IMPRESSION: 1. The right PICC line terminates in the right upper extremity. 2. Other support apparatus is in good position. 3. Platelike opacities in the bases favored to represent atelectasis. Recommend attention on follow-up. These results will be called to the ordering clinician or representative by the Radiologist Assistant, and communication documented in the PACS or [employer]. Electronically Signed   By: Dorise Bullion III M.D.   On: 05/21/2021 08:20    GI Procedures and Studies  No new relevant studies to review   ASSESSMENT  Mr. Bise is a 55 y.o. male with a pmh significant for cirrhosis secondary to [place] with  resultant portal hypertension (manifested as prior esophageal varices, ascites, PSE), HCC (status post recent MWA, bipolar disorder, prior alcohol use disorder, hyperlipidemia.  The patient is admitted with shock, concern for upper GI bleeding, and now progressive acute on chronic liver failure.  Patient is in multiorgan failure suspected to be related to septic shock from streptococcus mitis bacteremia, though this appears to be slowly improving. His elevated LFTs, suspected to be due to shock liver, have decreased over time. Patient is likely in DIC as his last INR was >10. Having some bleeding from OGT, though difficult to ascertain exactly how much. Hb has downtrended over time. Currently still on medical treatment for possible GI bleed. At this time due to the patient's significant coagulopathy, recommend holding off on EGD at this time. GI will continue to follow to reassess. 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 Goal would be for MAP greater than 85 but he has a very wide pulse pressure so not sure if that will be possible but will defer this discussion to the ICU team If patient worsens, can consider discussion with [facility] critical care team to see if they would want to transfer patient that had recent instrumentation done by their team at [facility] hepatology   LOS: 3 days  Sharyn Creamer  05/22/2021, 7:34 PM

## 2021-05-23 DIAGNOSIS — K7460 Unspecified cirrhosis of liver: Secondary | ICD-10-CM | POA: Diagnosis not present

## 2021-05-23 DIAGNOSIS — K7291 Hepatic failure, unspecified with coma: Secondary | ICD-10-CM

## 2021-05-23 DIAGNOSIS — R6520 Severe sepsis without septic shock: Secondary | ICD-10-CM | POA: Diagnosis not present

## 2021-05-23 DIAGNOSIS — Z978 Presence of other specified devices: Secondary | ICD-10-CM | POA: Diagnosis not present

## 2021-05-23 DIAGNOSIS — K7200 Acute and subacute hepatic failure without coma: Secondary | ICD-10-CM | POA: Diagnosis not present

## 2021-05-23 DIAGNOSIS — A419 Sepsis, unspecified organism: Secondary | ICD-10-CM | POA: Diagnosis not present

## 2021-05-23 LAB — COMPREHENSIVE METABOLIC PANEL
ALT: 2353 U/L — ABNORMAL HIGH (ref 0–44)
ALT: 1493 U/L — ABNORMAL HIGH (ref 0–44)
AST: 4361 U/L — ABNORMAL HIGH (ref 15–41)
AST: 2159 U/L — ABNORMAL HIGH (ref 15–41)
Albumin: 3.4 g/dL — ABNORMAL LOW (ref 3.5–5.0)
Albumin: 2.7 g/dL — ABNORMAL LOW (ref 3.5–5.0)
Alkaline Phosphatase: 415 U/L — ABNORMAL HIGH (ref 38–126)
Alkaline Phosphatase: 395 U/L — ABNORMAL HIGH (ref 38–126)
Anion gap: 18 — ABNORMAL HIGH (ref 5–15)
Anion gap: 12 (ref 5–15)
BUN: 31 mg/dL — ABNORMAL HIGH (ref 6–20)
BUN: 23 mg/dL — ABNORMAL HIGH (ref 6–20)
CO2: 19 mmol/L — ABNORMAL LOW (ref 22–32)
CO2: 32 mmol/L (ref 22–32)
Calcium: 8.2 mg/dL — ABNORMAL LOW (ref 8.9–10.3)
Calcium: 8.1 mg/dL — ABNORMAL LOW (ref 8.9–10.3)
Chloride: 95 mmol/L — ABNORMAL LOW (ref 98–111)
Chloride: 95 mmol/L — ABNORMAL LOW (ref 98–111)
Creatinine, Ser: 2.13 mg/dL — ABNORMAL HIGH (ref 0.61–1.24)
Creatinine, Ser: 1.32 mg/dL — ABNORMAL HIGH (ref 0.61–1.24)
GFR, Estimated: 35 mL/min — ABNORMAL LOW (ref >60)
GFR, Estimated: >60 mL/min (ref >60)
Glucose, Bld: 150 mg/dL — ABNORMAL HIGH (ref 70–99)
Glucose, Bld: 143 mg/dL — ABNORMAL HIGH (ref 70–99)
Potassium: 4.6 mmol/L (ref 3.5–5.1)
Potassium: 2.6 mmol/L — CL (ref 3.5–5.1)
Sodium: 132 mmol/L — ABNORMAL LOW (ref 135–145)
Sodium: 139 mmol/L (ref 135–145)
Total Bilirubin: 21.5 mg/dL (ref 0.3–1.2)
Total Bilirubin: 22.7 mg/dL (ref 0.3–1.2)
Total Protein: 5.2 g/dL — ABNORMAL LOW (ref 6.5–8.1)
Total Protein: 4.2 g/dL — ABNORMAL LOW (ref 6.5–8.1)

## 2021-05-23 LAB — GLUCOSE, CAPILLARY
Glucose-Capillary: 117 mg/dL — ABNORMAL HIGH (ref 70–99)
Glucose-Capillary: 127 mg/dL — ABNORMAL HIGH (ref 70–99)
Glucose-Capillary: 137 mg/dL — ABNORMAL HIGH (ref 70–99)
Glucose-Capillary: 148 mg/dL — ABNORMAL HIGH (ref 70–99)
Glucose-Capillary: 61 mg/dL — ABNORMAL LOW (ref 70–99)
Glucose-Capillary: 61 mg/dL — ABNORMAL LOW (ref 70–99)
Glucose-Capillary: 92 mg/dL (ref 70–99)
Glucose-Capillary: 94 mg/dL (ref 70–99)
Glucose-Capillary: 99 mg/dL (ref 70–99)

## 2021-05-23 LAB — CBC
HCT: 26.3 % — ABNORMAL LOW (ref 39.0–52.0)
Hemoglobin: 8.7 g/dL — ABNORMAL LOW (ref 13.0–17.0)
MCH: 35.8 pg — ABNORMAL HIGH (ref 26.0–34.0)
MCHC: 33.1 g/dL (ref 30.0–36.0)
MCV: 108.2 fL — ABNORMAL HIGH (ref 80.0–100.0)
Platelets: 22 10*3/uL — CL (ref 150–400)
RBC: 2.43 MIL/uL — ABNORMAL LOW (ref 4.22–5.81)
RDW: 19.6 % — ABNORMAL HIGH (ref 11.5–15.5)
WBC: 12.7 10*3/uL — ABNORMAL HIGH (ref 4.0–10.5)
nRBC: 0.3 % — ABNORMAL HIGH (ref 0.0–0.2)

## 2021-05-23 LAB — BASIC METABOLIC PANEL
Anion gap: 13 (ref 5–15)
BUN: 26 mg/dL — ABNORMAL HIGH (ref 6–20)
CO2: 31 mmol/L (ref 22–32)
Calcium: 8 mg/dL — ABNORMAL LOW (ref 8.9–10.3)
Chloride: 97 mmol/L — ABNORMAL LOW (ref 98–111)
Creatinine, Ser: 1.41 mg/dL — ABNORMAL HIGH (ref 0.61–1.24)
GFR, Estimated: 58 mL/min — ABNORMAL LOW (ref >60)
Glucose, Bld: 116 mg/dL — ABNORMAL HIGH (ref 70–99)
Potassium: 3 mmol/L — ABNORMAL LOW (ref 3.5–5.1)
Sodium: 141 mmol/L (ref 135–145)

## 2021-05-23 LAB — PROTIME-INR
INR: >10 (ref 0.8–1.2)
Prothrombin Time: >90 seconds — ABNORMAL HIGH (ref 11.4–15.2)

## 2021-05-23 LAB — MAGNESIUM: Magnesium: 1.8 mg/dL (ref 1.7–2.4)

## 2021-05-23 MED ORDER — ALBUMIN HUMAN 25 % IV SOLN
25.0000 g | Freq: Once | INTRAVENOUS | Status: AC
  Administered 2021-05-23: 25 g via INTRAVENOUS
  Filled 2021-05-23: qty 100

## 2021-05-23 MED ORDER — DEXTROSE 50 % IV SOLN
12.5000 g | INTRAVENOUS | Status: AC
  Administered 2021-05-23: 12.5 g via INTRAVENOUS

## 2021-05-23 MED ORDER — LACTULOSE 10 GM/15ML PO SOLN
30.0000 g | Freq: Four times a day (QID) | ORAL | Status: DC
  Administered 2021-05-23 – 2021-05-24 (×4): 30 g
  Filled 2021-05-23 (×4): qty 45

## 2021-05-23 MED ORDER — VITAL 1.5 CAL PO LIQD
1000.0000 mL | ORAL | Status: DC
  Administered 2021-05-23: 1000 mL
  Filled 2021-05-23: qty 1000

## 2021-05-23 MED ORDER — VITAL HIGH PROTEIN PO LIQD: 1000.0000 mL | ORAL | Status: DC

## 2021-05-23 MED ORDER — POTASSIUM CHLORIDE 20 MEQ PO PACK
40.0000 meq | PACK | Freq: Two times a day (BID) | ORAL | Status: DC
  Administered 2021-05-23 – 2021-05-24 (×2): 40 meq
  Filled 2021-05-23 (×2): qty 2

## 2021-05-23 MED ORDER — POTASSIUM CHLORIDE 10 MEQ/50ML IV SOLN
10.0000 meq | INTRAVENOUS | Status: AC
  Administered 2021-05-23 (×4): 10 meq via INTRAVENOUS
  Filled 2021-05-23 (×4): qty 50

## 2021-05-23 MED ORDER — DEXTROSE 50 % IV SOLN
INTRAVENOUS | Status: AC
  Filled 2021-05-23: qty 50

## 2021-05-23 MED ORDER — DEXTROSE 50 % IV SOLN
25.0000 g | Freq: Once | INTRAVENOUS | Status: AC
  Administered 2021-05-23: 25 g via INTRAVENOUS
  Filled 2021-05-23: qty 50

## 2021-05-23 MED ORDER — PROSOURCE TF PO LIQD
45.0000 mL | Freq: Three times a day (TID) | ORAL | Status: DC
  Administered 2021-05-23 – 2021-05-24 (×3): 45 mL
  Filled 2021-05-23 (×3): qty 45

## 2021-05-23 MED ORDER — POTASSIUM CHLORIDE 20 MEQ PO PACK
40.0000 meq | PACK | Freq: Once | ORAL | Status: AC
  Administered 2021-05-23: 40 meq
  Filled 2021-05-23: qty 2

## 2021-05-23 NOTE — Progress Notes (Signed)
NAME:  [name], MRN:  [id number], DOB:  04-01-1966, LOS: 4 ADMISSION DATE:  05/08/2021, CONSULTATION DATE:  05/23/2021  REFERRING MD:  Hal Hope, CHIEF COMPLAINT: Hypotension  History of Present Illness:  55 year old man with known history of alcoholic cirrhosis and varices and seizure disorder recently discharged from the hospital 2 days ago for hepatic encephalopathy.  He states that he had a seizure and fell down the stairs.  He was found facedown in his closet brought in by EMS with GCS of 8 which improved.  Initially hypothermic to 96, initial labs showed WBC of 17 K, sodium of 124 which is chronically low and increase in creatinine from baseline 0.7-1.3 lipase was 70 total bilirubin slightly increased. Initial lactate was 5.3 and decreased to 4.6 ammonia level was 71. He was initially admitted by [facility] but then developed hypotension and peripheral Levophed was started and PCCM called for admission  Significant Hospital Events: Including procedures, antibiotic start and stop dates in addition to other pertinent events   9/15 > Admitted to [facility].  9/16 > Hypotension requiring Levophed in the early AM. Transferred to ICU, intubated, started requiring vasopressors 9/18 > improving vasopressor requirements.   Interim History / Subjective:  Remains unresponsive. Stable vasopressor requirements.   Objective   Blood pressure (!) 104/54, pulse 97, temperature 99.2 F (37.3 C), temperature source Axillary, resp. rate 20, height '6\' 1"'$  (1.854 m), weight 96.7 kg, SpO2 100 %.    Vent Mode: PRVC FiO2 (%):  [40 %] 40 % Set Rate:  [20 bmp] 20 bmp Vt Set:  [560 mL] 560 mL PEEP:  [5 cmH20] 5 cmH20 Plateau Pressure:  [13 cmH20-15 cmH20] 14 cmH20   Intake/Output Summary (Last 24 hours) at 05/23/2021 1230 Last data filed at 05/23/2021 1100 Gross per 24 hour  Intake 4313.66 ml  Output 6920 ml  Net -2606.34 ml    Filed Weights   05/21/21 0500 05/22/21 0500 05/23/21 0406  Weight:  93.2 kg 99.2 kg 96.7 kg   Examination: General: Critically ill-appearing male who appears older than stated age.  Orally intubated  HENT: Scleral icterus with dry mucus membranes.  OGT and ETT in place Lungs:  Vesicular breath sounds throughout, no wheezes or crackles Cardiovascular: Regular rate and rhythm. No murmurs.  Abdomen: Soft, distended, bowel sounds present Extremities: Pitting edema of the bilateral lower extremities. Extremely jaundiced.  Severe Scrotal edema.  Neuro: Sedated, not following commands, RASS -5  Resolved Hospital Problem list    Hyperkalemia Assessment & Plan:  Septic Shock due to Streptococcus mitis bacteremia Acute Hypoxic Respiratory Failure  Shock liver superimposed on advanced cirrhosis Acute Kidney Injury could be related to hepatorenal syndrome versus sepsis High anion gap metabolic acidosis due to high lactic acid level Upper GI Bleed, likely due to portal hypertensive gastropathy Acute metabolic/hepatic/septic encephalopathy  Decompensated Cirrhosis with ascites, portal hypertension and coagulopathy Hepatocellular carcinoma s/p ablation recently at [facility] Acute blood loss anemia Thrombocytopenia due to liver disease and septic shock Chronic Hyponatremia  Seizure disorder   Plan:  -Continue to wean vasopressors as tolerated.  Resolution of shock physiology will be slow in the context of vasodilated cirrhotic state. -No evidence of endocarditis on surface echo.  Hold on transesophageal while INR is elevated. -Continue Unasyn as prescribed for Streptococcus mitis no evidence of abscesses. -Complete 72 hours of octreotide, continue PPI indefinitely. -Stop all sedatives allow mentation to clear. -Increase lactulose continue rifaximin for hepatic encephalopathy -Initiate SBT if mental status allows. -Limited therapeutic options at  present.  Needs time for septic physiology to clear. -Improving LFT profile.  However real test will be to see improvement  in INR as synthetic function recovers.  If it does not then options are limited and patient unlikely to survive at that point.  Currently a DNR.  Continue with family discussions.  Best Practice (right click and "Reselect all SmartList Selections" daily)   Diet/type: NPO DVT prophylaxis: SCDs GI prophylaxis: PPI Lines: Central line Foley: Yes still needed Code Status:  full code Last date of multidisciplinary goals of care discussion: 9/18, patient's brother spoke with his siblings and they decided to proceed with DNR. Labs   CBC: Recent Labs  Lab 05/09/2021 1836 05/07/2021 0529 05/18/2021 1310 05/29/2021 1452 05/21/21 0320 05/21/21 2021 05/21/21 2041 05/22/21 0309 05/22/21 0332 05/23/21 0355  WBC 17.3*   < > 26.4*  --  21.6*  --  15.3* 12.1*  --  12.7*  NEUTROABS 13.7*  --   --   --   --   --   --   --   --   --   HGB 11.4*   < > 10.9*   < > 9.2* 8.2* 8.1* 8.2* 7.8* 8.7*  HCT 33.4*   < > 33.4*   < > 28.1* 24.0* 24.4* 24.1* 23.0* 26.3*  MCV 105.4*   < > 108.4*  --  110.2*  --  109.4* 108.1*  --  108.2*  PLT PLATELET CLUMPS NOTED ON SMEAR, UNABLE TO ESTIMATE   < > 136*  --  PLATELET CLUMPS NOTED ON SMEAR, COUNT APPEARS DECREASED  --  39* 27*  --  22*   < > = values in this interval not displayed.     Basic Metabolic Panel: Recent Labs  Lab 05/17/21 0452 05/13/2021 1653 05/21/21 0320 05/21/21 1419 05/21/21 2021 05/21/21 2041 05/22/21 0309 05/22/21 0332 05/23/21 0355  NA 124*   < > 127* 128* 130* 129* 132* 132* 139  K 3.7   < > 5.9* 5.9* 5.3* 5.5* 4.6 4.5 2.6*  CL 95*   < > 97* 98  --  95* 95*  --  95*  CO2 22   < > 12* 16*  --  17* 19*  --  32  GLUCOSE 96   < > 81 113*  --  137* 150*  --  143*  BUN 10   < > 29* 33*  --  33* 31*  --  23*  CREATININE 0.59*   < > 2.25* 2.29*  --  2.38* 2.13*  --  1.32*  CALCIUM 8.5*   < > 8.5* 7.9*  --  8.0* 8.2*  --  8.1*  MG 1.4*  --  2.3  --   --   --  2.3  --  1.8  PHOS 2.6  --  7.0*  --   --   --  5.5*  --   --    < > = values in this  interval not displayed.    GFR: Estimated Creatinine Clearance: 75.6 mL/min (A) (by C-G formula based on SCr of 1.32 mg/dL (H)). Recent Labs  Lab 05/07/2021 1623 05/11/2021 1836 05/16/2021 0529 06/01/2021 1310 05/18/2021 1715 05/21/21 0320 05/21/21 2041 05/22/21 0309 05/23/21 0355  PROCALCITON  --   --  0.25  --   --  3.36  --  0.71  --   WBC  --  17.3* 17.8*   < >  --  21.6* 15.3* 12.1* 12.7*  LATICACIDVEN 5.3* 4.6* 10.0*  --  9.1*  --   --   --   --    < > = values in this interval not displayed.     Liver Function Tests: Recent Labs  Lab 05/21/2021 0529 05/21/21 0320 05/21/21 1419 05/22/21 0309 05/23/21 0355  AST 430* >10,000* 7,272* 4,361* 2,159*  ALT 228* 3,272* 2,724* 2,353* 1,493*  ALKPHOS 194* 396* 417* 415* 395*  BILITOT 15.3* 19.6* 20.0* 21.5* 22.7*  PROT 5.0* 4.9* 4.8* 5.2* 4.2*  ALBUMIN 1.9* 2.7* 2.9* 3.4* 2.7*    Recent Labs  Lab 05/13/2021 1653  LIPASE 70*    Recent Labs  Lab 05/17/21 0819 05/29/2021 1653 05/08/2021 0529  AMMONIA 37* 71* 52*     ABG    Component Value Date/Time   PHART 7.578 (H) 05/22/2021 0332   PCO2ART 23.2 (L) 05/22/2021 0332   PO2ART 104 05/22/2021 0332   HCO3 21.7 05/22/2021 0332   TCO2 22 05/22/2021 0332   ACIDBASEDEF 4.0 (H) 05/21/2021 2021   O2SAT 99.0 05/22/2021 0332      Coagulation Profile: Recent Labs  Lab 06/01/2021 0529 05/21/21 0320 05/21/21 1419 05/23/21 0355  INR 3.1* 8.1* 10.0* >10.0*     Cardiac Enzymes: Recent Labs  Lab 05/08/2021 1653 05/10/2021 0529 05/21/21 0320  CKTOTAL 403* 389 610*     HbA1C: Hgb A1c MFr Bld  Date/Time Value Ref Range Status  01/07/2013 12:00 PM 4.4 (L) 4.6 - 6.5 % Final    Comment:    Glycemic Control Guidelines for People with Diabetes:Non Diabetic:  <6%Goal of Therapy: <7%Additional Action Suggested:  >8%     CBG: Recent Labs  Lab 05/22/21 2006 05/23/21 0009 05/23/21 0322 05/23/21 0735 05/23/21 1134  GLUCAP 167* 148* 137* 117* 92     CRITICAL CARE Performed  by: Kipp Brood   Total critical care time: 40 minutes  Critical care time was exclusive of separately billable procedures and treating other patients.  Critical care was necessary to treat or prevent imminent or life-threatening deterioration.  Critical care was time spent personally by me on the following activities: development of treatment plan with patient and/or surrogate as well as nursing, discussions with consultants, evaluation of patient's response to treatment, examination of patient, obtaining history from patient or surrogate, ordering and performing treatments and interventions, ordering and review of laboratory studies, ordering and review of radiographic studies, pulse oximetry, re-evaluation of patient's condition and participation in multidisciplinary rounds.  Kipp Brood, MD [facility] ICU Physician [place]  Pager: [phone] Mobile: [phone] After hours: [phone].

## 2021-05-23 NOTE — Progress Notes (Addendum)
Daily Rounding Note  05/23/2021, 12:47 PM  LOS: 4 days   SUBJECTIVE:   Chief complaint: Blood per orogastric tube.  Remains encephalopathic.  Weaning off Precedex.  Remains on vent.  Still passing pinkish discharge from the OG tube.  Has not had any bowel movements.  CCM would like to start tube feeds if there are no plans for GI to pursue EGD.  OBJECTIVE:         Vital signs in last 24 hours:    Temp:  [94.5 F (34.7 C)-99.5 F (37.5 C)] 99.2 F (37.3 C) (09/19 1136) Pulse Rate:  [62-114] 97 (09/19 1215) Resp:  [19-20] 20 (09/19 1215) BP: (96-129)/(48-72) 104/54 (09/19 1200) SpO2:  [100 %] 100 % (09/19 1215) FiO2 (%):  [40 %] 40 % (09/19 1200) Weight:  [96.7 kg] 96.7 kg (09/19 0406) Last BM Date:  (PTA) Filed Weights   05/21/21 0500 05/22/21 0500 05/23/21 0406  Weight: 93.2 kg 99.2 kg 96.7 kg   General: Sedated on vent.  Looks critically ill.  Jaundiced. Heart: RRR. Chest: Unlabored breathing on vent. Abdomen: Soft.  Active bowel sounds.  No distention. GU: Scrotal swelling.  Pinkish-red, clear discharge in OG tubing and canister. Extremities: Minor pedal edema. Neuro/Psych: Unresponsive to exam.  Intake/Output from previous day: 09/18 0701 - 09/19 0700 In: 4871 [I.V.:4344; NG/GT:110; IV Piggyback:417] Out: 8070 [Urine:7470; Emesis/NG output:600]  Intake/Output this shift: Total I/O In: 791.5 [I.V.:355.7; NG/GT:135; IV Piggyback:300.8] Out: 670 [Urine:670]  Lab Results: Recent Labs    05/21/21 2041 05/22/21 0309 05/22/21 0332 05/23/21 0355  WBC 15.3* 12.1*  --  12.7*  HGB 8.1* 8.2* 7.8* 8.7*  HCT 24.4* 24.1* 23.0* 26.3*  PLT 39* 27*  --  22*   BMET Recent Labs    05/21/21 2041 05/22/21 0309 05/22/21 0332 05/23/21 0355  NA 129* 132* 132* 139  K 5.5* 4.6 4.5 2.6*  CL 95* 95*  --  95*  CO2 17* 19*  --  32  GLUCOSE 137* 150*  --  143*  BUN 33* 31*  --  23*  CREATININE 2.38* 2.13*  --   1.32*  CALCIUM 8.0* 8.2*  --  8.1*   LFT Recent Labs    05/21/21 1419 05/22/21 0309 05/23/21 0355  PROT 4.8* 5.2* 4.2*  ALBUMIN 2.9* 3.4* 2.7*  AST 7,272* 4,361* 2,159*  ALT 2,724* 2,353* 1,493*  ALKPHOS 417* 415* 395*  BILITOT 20.0* 21.5* 22.7*   PT/INR Recent Labs    05/21/21 1419 05/23/21 0355  LABPROT 79.9* >90.0*  INR 10.0* >10.0*   Hepatitis Panel No results for input(s): HEPBSAG, HCVAB, HEPAIGM, HEPBIGM in the last 72 hours.  Studies/Results: No results found.  Scheduled Meds:  chlorhexidine gluconate (MEDLINE KIT)  15 mL Mouth Rinse BID   Chlorhexidine Gluconate Cloth  6 each Topical Daily   docusate  100 mg Per Tube BID   fentaNYL (SUBLIMAZE) injection  50 mcg Intravenous Once   hydrocortisone sod succinate (SOLU-CORTEF) inj  100 mg Intravenous Q12H   lactulose  30 g Per Tube QID   levETIRAcetam  1,500 mg Per Tube BID   mouth rinse  15 mL Mouth Rinse 10 times per day   midodrine  10 mg Per Tube Q8H   pantoprazole (PROTONIX) IV  40 mg Intravenous Q12H   rifaximin  550 mg Per Tube BID   sodium chloride flush  10-40 mL Intracatheter Q12H   Continuous Infusions:  sodium chloride 999 mL/hr at  05/30/2021 0951   ampicillin-sulbactam (UNASYN) IV 200 mL/hr at 05/23/21 1100   fentaNYL infusion INTRAVENOUS 50 mcg/hr (05/23/21 1100)   norepinephrine (LEVOPHED) Adult infusion 5 mcg/min (05/22/21 0800)   octreotide  (SANDOSTATIN)    IV infusion 50 mcg/hr (05/23/21 1225)   PRN Meds:.docusate, fentaNYL, midazolam, polyethylene glycol, sodium chloride flush   ASSESMENT:   Multiorgan failure, streptococcal viridans and strep mitis bacteremia.   AKI improving.    Blood per OG tube.  Hx portal hypertension, banding of esophageal varices 2015 with mild varices on subsequent EGDs.  Latest EGD was 09/10/2020 when he had a grade 1, nonbleeding varix with scarring and normal stomach, duodenum.  Empiric octreotide, Protonix 40 IV bid in place.  Critical thrombocytopenia.   Platelets currently 22.    Likely DIC.  INR still > 10.  Vit K IV x 3 d 9/16 - 9/18.  Patient is Jehovah's Witness and does not want administration of blood products.  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  Now with acutely elevated LFTs from shock liver.  With the exception of T bili, LFTs improving.     Ablation of [facility] 2016 and 04/2021.  Followed closely by [facility] GI/hepatology.  Ascites.  Ascitic fluid suspicious for SBP on paracentesis 9/6.  Current fluid studies not suspicious for SBP.  Unasyn in place.     Encephalopathy.  Had been on lactulose, rifaximin added during recent hospitalization but was unable to afford this at discharge.  Now receiving rifaximin bid, lactulose 30 g qid.      Hyponatremia, resolved.[name]   PLAN   Given his coagulopathy and thrombocytopenia, no plans for EGD.   Likely will be fine to initiate tube feeds but will clear this with Dr. Annamary Rummage  05/23/2021, 12:47 PM Phone [phone]

## 2021-05-23 NOTE — Progress Notes (Signed)
[facility] ADULT ICU REPLACEMENT PROTOCOL   The patient does apply for the [facility] Adult ICU Electrolyte Replacment Protocol based on the criteria listed below:   1.Exclusion criteria: TCTS patients, ECMO patients and Hypothermia Protocol, and   Dialysis patients 2. Is GFR >/= 30 ml/min? Yes.    Patient's GFR today is >60 3. Is SCr </= 2? Yes.   Patient's SCr is 1.32 mg/dL 4. Did SCr increase >/= 0.5 in 24 hours? No. 5.Pt's weight >40kg  Yes.   6. Abnormal electrolyte(s): K 2.6  7. Electrolytes replaced per protocol 8.  Call MD STAT for K+ </= 2.5, Phos </= 1, or Mag </= 1 Physician:    Ronda Fairly A 05/23/2021 5:25 AM

## 2021-05-23 NOTE — Progress Notes (Signed)
This RN and MD at bedside. MD turned off precedex and bicarb drip. At this time patient's BP began to drop and A-line was reading 67/39 peripheral cuff reading 73/46. MD gave verbal order to increase levophed drip to 20 and continue to go up/down as needed. Patient was on 24 mcg/min for a couple minutes until BP came up. After a couple of minutes the patient's BP did stabilize and was able to return back down to the lower dose of levophed he had been on previously. Pump not communicating properly with the I/O tab in flow sheets.

## 2021-05-23 NOTE — Progress Notes (Signed)
Nutrition Follow-up  DOCUMENTATION CODES:   Non-severe (moderate) malnutrition in context of chronic illness  INTERVENTION:   Once medically appropriate, recommend initiation of enteral nutrition: - Start Vital 1.5 @ 20 ml/hr and advance by 10 ml q 8 hours to goal rate of 60 ml/hr (1440 ml/day) - ProSource TF 45 ml TID  Recommended tube feeding regimen at goal would provide 2280 kcal, 130 grams of protein, and 1100 ml of H2O.  NUTRITION DIAGNOSIS:   Moderate Malnutrition related to chronic illness (cirrhosis, hepatocellular carcinoma) as evidenced by moderate fat depletion, moderate muscle depletion. Ongoing.   GOAL:   Patient will meet greater than or equal to 90% of their needs Not met.   MONITOR:   Vent status, Labs, Weight trends, I & O's  REASON FOR ASSESSMENT:   Ventilator    ASSESSMENT:   55 year old male who presented to the ED on 9/15 after being found face-down in closet. PMH of cirrhosis 2/2 EtOH abuse, hepatic encephalopathy, esophageal varices, chronic hyponatremia, chronic thrombocytopenia, hepatocellular carcinoma s/p TACE, seizure disorder, bipolar disorder, Wilson's disease. Pt with recent discharge from the hospital after being treated for hepatic encephalopathy. Pt admitted with severe sepsis, AKI.  9/16 - intubated, s/p paracentesis  Spoke with RN at bedside. CCM ok to start TF today if ok with GI. Per GI PA note no plans for EGD currently with INR > 10. Reached out to GI PA who would like to defer ok to start TF to GI MD. Notified RN of TF recommendations in RD note.   Per CCM holding TEE for now due to INR. Pt is a DNR, MD continues to have discussion with family.  Weaning sedation, pressors weaning.  Patient is currently intubated on ventilator support MV: 11.3 L/min Temp (24hrs), Avg:97.6 F (36.4 C), Min:94.5 F (34.7 C), Max:99.5 F (37.5 C) BP (a-line): 99/58 MAP (a-line): >66, one episode of hypotension due to adjustments to sedation and  it has now recovered  Drips: Fentanyl Levophed @ 8 mcg  Octreotide  Medications reviewed and include: IV solu-cortef, lactulose, IV protonix  Labs reviewed: K: 2.6, BUN 23, creatinine 1.32, elevated LFTs, INR > 10 CBG's: 92-117 x 24 hours  UOP: 7470 ml x 12 hours OG: 600 ml  I&O: -2.4 L     Diet Order:   Diet Order             Diet NPO time specified  Diet effective now                   EDUCATION NEEDS:   Not appropriate for education at this time  Skin:  Skin Assessment: Reviewed RN Assessment  Last BM:  no documented BM  Height:   Ht Readings from Last 1 Encounters:  05/26/2021 6' 1"  (1.854 m)    Weight:   Wt Readings from Last 1 Encounters:  05/23/21 96.7 kg    BMI:  Body mass index is 28.13 kg/m.  Estimated Nutritional Needs:   Kcal:  2200-2400  Protein:  120-140 grams  Fluid:  >/= 2.0 L   Cherl Gorney P., RD, LDN, CNSC See AMiON for contact information

## 2021-05-24 DIAGNOSIS — K7460 Unspecified cirrhosis of liver: Secondary | ICD-10-CM | POA: Diagnosis not present

## 2021-05-24 DIAGNOSIS — K769 Liver disease, unspecified: Secondary | ICD-10-CM | POA: Diagnosis not present

## 2021-05-24 DIAGNOSIS — Z515 Encounter for palliative care: Secondary | ICD-10-CM

## 2021-05-24 DIAGNOSIS — Z7189 Other specified counseling: Secondary | ICD-10-CM

## 2021-05-24 DIAGNOSIS — R402443 Other coma, without documented Glasgow coma scale score, or with partial score reported, at hospital admission: Secondary | ICD-10-CM | POA: Diagnosis not present

## 2021-05-24 DIAGNOSIS — K7201 Acute and subacute hepatic failure with coma: Secondary | ICD-10-CM | POA: Diagnosis not present

## 2021-05-24 DIAGNOSIS — K7290 Hepatic failure, unspecified without coma: Secondary | ICD-10-CM | POA: Diagnosis not present

## 2021-05-24 DIAGNOSIS — Z66 Do not resuscitate: Secondary | ICD-10-CM

## 2021-05-24 DIAGNOSIS — J392 Other diseases of pharynx: Secondary | ICD-10-CM | POA: Diagnosis not present

## 2021-05-24 DIAGNOSIS — K920 Hematemesis: Secondary | ICD-10-CM | POA: Diagnosis not present

## 2021-05-24 LAB — CBC
HCT: 29 % — ABNORMAL LOW (ref 39.0–52.0)
Hemoglobin: 9.2 g/dL — ABNORMAL LOW (ref 13.0–17.0)
MCH: 36.2 pg — ABNORMAL HIGH (ref 26.0–34.0)
MCHC: 31.7 g/dL (ref 30.0–36.0)
MCV: 114.2 fL — ABNORMAL HIGH (ref 80.0–100.0)
Platelets: 15 10*3/uL — CL (ref 150–400)
RBC: 2.54 MIL/uL — ABNORMAL LOW (ref 4.22–5.81)
RDW: 20.6 % — ABNORMAL HIGH (ref 11.5–15.5)
WBC: 18.1 10*3/uL — ABNORMAL HIGH (ref 4.0–10.5)
nRBC: 0.4 % — ABNORMAL HIGH (ref 0.0–0.2)

## 2021-05-24 LAB — GLUCOSE, CAPILLARY
Glucose-Capillary: 98 mg/dL (ref 70–99)
Glucose-Capillary: 72 mg/dL (ref 70–99)
Glucose-Capillary: 97 mg/dL (ref 70–99)
Glucose-Capillary: 92 mg/dL (ref 70–99)

## 2021-05-24 LAB — BODY FLUID CULTURE W GRAM STAIN: Culture: NO GROWTH

## 2021-05-24 LAB — COMPREHENSIVE METABOLIC PANEL
ALT: 885 U/L — ABNORMAL HIGH (ref 0–44)
AST: 1015 U/L — ABNORMAL HIGH (ref 15–41)
Albumin: 2.7 g/dL — ABNORMAL LOW (ref 3.5–5.0)
Alkaline Phosphatase: 317 U/L — ABNORMAL HIGH (ref 38–126)
Anion gap: 14 (ref 5–15)
BUN: 31 mg/dL — ABNORMAL HIGH (ref 6–20)
CO2: 30 mmol/L (ref 22–32)
Calcium: 8.2 mg/dL — ABNORMAL LOW (ref 8.9–10.3)
Chloride: 97 mmol/L — ABNORMAL LOW (ref 98–111)
Creatinine, Ser: 1.77 mg/dL — ABNORMAL HIGH (ref 0.61–1.24)
GFR, Estimated: 44 mL/min — ABNORMAL LOW (ref >60)
Glucose, Bld: 98 mg/dL (ref 70–99)
Potassium: 3.5 mmol/L (ref 3.5–5.1)
Sodium: 141 mmol/L (ref 135–145)
Total Bilirubin: 26.7 mg/dL (ref 0.3–1.2)
Total Protein: 4.4 g/dL — ABNORMAL LOW (ref 6.5–8.1)

## 2021-05-24 LAB — CULTURE, BLOOD (ROUTINE X 2): Culture: NO GROWTH

## 2021-05-24 LAB — PATHOLOGIST SMEAR REVIEW

## 2021-05-24 MED ORDER — HALOPERIDOL LACTATE 5 MG/ML IJ SOLN: 5.0000 mg | INTRAMUSCULAR | Status: DC | PRN

## 2021-05-24 MED ORDER — LORAZEPAM 2 MG/ML IJ SOLN
1.0000 mg | INTRAMUSCULAR | Status: DC | PRN
  Administered 2021-05-24: 1 mg via INTRAVENOUS
  Filled 2021-05-24: qty 1

## 2021-05-24 MED ORDER — LORAZEPAM 2 MG/ML PO CONC: 1.0000 mg | ORAL | Status: DC | PRN

## 2021-05-24 MED ORDER — ONDANSETRON 4 MG PO TBDP: 4.0000 mg | ORAL_TABLET | Freq: Four times a day (QID) | ORAL | Status: DC | PRN

## 2021-05-24 MED ORDER — GLYCOPYRROLATE 0.2 MG/ML IJ SOLN: 0.2000 mg | INTRAMUSCULAR | Status: DC | PRN

## 2021-05-24 MED ORDER — POLYVINYL ALCOHOL 1.4 % OP SOLN
1.0000 [drp] | Freq: Four times a day (QID) | OPHTHALMIC | Status: DC | PRN
  Filled 2021-05-24: qty 15

## 2021-05-24 MED ORDER — LORAZEPAM 2 MG/ML IJ SOLN
2.0000 mg | INTRAMUSCULAR | Status: AC
  Administered 2021-05-24: 2 mg via INTRAVENOUS
  Filled 2021-05-24: qty 1

## 2021-05-24 MED ORDER — ONDANSETRON HCL 4 MG/2ML IJ SOLN: 4.0000 mg | Freq: Four times a day (QID) | INTRAMUSCULAR | Status: DC | PRN

## 2021-05-24 MED ORDER — LORAZEPAM 1 MG PO TABS: 1.0000 mg | ORAL_TABLET | ORAL | Status: DC | PRN

## 2021-05-24 MED ORDER — GLYCOPYRROLATE 1 MG PO TABS: 1.0000 mg | ORAL_TABLET | ORAL | Status: DC | PRN

## 2021-05-24 MED ORDER — BIOTENE DRY MOUTH MT LIQD: 15.0000 mL | OROMUCOSAL | Status: DC | PRN

## 2021-05-24 MED ORDER — FENTANYL BOLUS VIA INFUSION
50.0000 ug | INTRAVENOUS | Status: DC | PRN
  Administered 2021-05-24 (×8): 100 ug via INTRAVENOUS
  Administered 2021-05-24: 50 ug via INTRAVENOUS
  Administered 2021-05-24: 100 ug via INTRAVENOUS
  Filled 2021-05-24: qty 100

## 2021-05-24 MED ORDER — DEXTROSE-NACL 5-0.9 % IV SOLN: INTRAVENOUS | Status: DC

## 2021-05-24 MED ORDER — MIDAZOLAM HCL 2 MG/2ML IJ SOLN: 2.0000 mg | INTRAMUSCULAR | Status: DC | PRN

## 2021-05-25 LAB — CULTURE, BLOOD (ROUTINE X 2): Special Requests: ADEQUATE

## 2021-06-04 NOTE — Consult Note (Signed)
Consultation Note Date: 06/21/2021   Patient Name: [name]  DOB: 10-Jun-1966  MRN: [id number]  Age / Sex: 55 y.o., male  PCP: Cher Nakai, MD Referring Physician: Kipp Brood, MD  Reason for Consultation: Establishing goals of care  HPI/Patient Profile: 55 y.o. male  with past medical history of  cirrhosis secondary to alcohol abuse, hepatic encephalopathy, esophageal varices, chronic hyponatremia, chronic thrombocytopenia, hepatocellular carcinoma status post TACE, seizure disorder, and bipolar disorder admitted on 05/15/2021 with hepatic encephalopathy.  Patient continues to be critically ill due persistent distributive shock, encephalopathy, and respiratory failure due to decompensated end-stage liver disease. Patient also remains coagulopathic with INR >10. PMT consulted to discuss [place].  Clinical Assessment and Goals of Care: I have reviewed medical records including EPIC notes, labs and imaging, received report from RN, assessed the patient and then met with patient's 2 brothers [name] and Dominica Severin and SIL Claiborne Billings  to discuss diagnosis prognosis, GOC, EOL wishes, disposition and options.  Family share they are decision makers and next of kin. Patient does have children but they are estranged - have not seen patient in >20 years.   I introduced Palliative Medicine as specialized medical care for people living with serious illness. It focuses on providing relief from the symptoms and stress of a serious illness. The goal is to improve quality of life for both the patient and the family.  We discussed a brief life review of the patient. [name] shares patient has been living with friends. [name] shares that patient enjoys spending time with friends and seeing live music.  [name] shares about decline in patient's health - multiple hospital admissions recently. He tells me patient has been critically ill multiple times. [name] shares about recent treatment  for liver cancer and subsequent hospital admissions.    We discussed patient's current illness and what it means in the larger context of patient's on-going co-morbidities.  Natural disease trajectory and expectations at EOL were discussed. We discussed multiorgan failure. Discussed patient appears to be nearing end of life. Family expresses understanding.   I attempted to elicit values and goals of care important to the patient.  Family shares that patient had told them in the past he did not want to pursue liver transplant. They all agree that, given the circumstances, patient would want to focus on his comfort.   We discussing freeing patient from medical interventions including vasopressors and ventilator. We discussing using medications to ensure his comfort. Family agrees.  We discussed very poor prognosis. We discussed following extubation likely minutes to hours prior to passing away.   Questions and concerns were addressed. The family was encouraged to call with questions or concerns.    Primary Decision Maker NEXT OF KIN    SUMMARY OF RECOMMENDATIONS   - comfort measures only - extubate, dc vaso pressors - continue fentanyl infusion with PRN boluses, PRN ativan - anticipate hospital death  Code Status/Advance Care Planning: DNR  Prognosis:  Hours - Days  Discharge Planning: Anticipated Hospital Death      Primary Diagnoses: Present on Admission:  Acute hepatic encephalopathy  Chronic liver disease and cirrhosis ([place])  History of alcohol abuse  Shock ([place])   I have reviewed the medical record, interviewed the patient and family, and examined the patient. The following aspects are pertinent.  Past Medical History:  Diagnosis Date   Abuse, drug or alcohol ([place])    Anemia    Bipolar affective disorder ([place])    [place]  Cancer ([place])    liver cancer treated with micrablation   Chronic liver disease and cirrhosis    Depression    DJD  (degenerative joint disease)    right knee   Dyslipidemia    Encephalopathy    Esophageal varices ([facility])    Hernia    History of alcohol abuse    PONV (postoperative nausea and vomiting)    Seizures ([facility])    Dr Jannifer Franklin   Thrombocytopenia ([place])    Wilson disease    Wrist fracture    right   Social History   Socioeconomic History   Marital status: Divorced    Spouse name: Not on file   Number of children: 2   Years of education: HS   Highest education level: Not on file  Occupational History   Occupation: disabled    Employer: [employer]  Tobacco Use   Smoking status: Every Day    Packs/day: 0.50    Types: Cigarettes   Smokeless tobacco: Never  Vaping Use   Vaping Use: Never used  Substance and Sexual Activity   Alcohol use: No    Alcohol/week: 0.0 standard drinks    Comment: 3-4 bottles a week, quit drinking 2005   Drug use: No    Comment: last use 2005   Sexual activity: Yes  Other Topics Concern   Not on file  Social History Narrative   Patient is right handed.   Patient drinks some caffeine daily.   Lives in a one story home.   Social Determinants of Health   Financial Resource Strain: Not on file  Food Insecurity: Not on file  Transportation Needs: Not on file  Physical Activity: Not on file  Stress: Not on file  Social Connections: Not on file   Family History  Problem Relation Age of Onset   Heart disease Other    Diabetes Father    Dementia Mother    Glaucoma Brother    Scheduled Meds:  fentaNYL (SUBLIMAZE) injection  50 mcg Intravenous Once   mouth rinse  15 mL Mouth Rinse 10 times per day   Continuous Infusions:  sodium chloride 999 mL/hr at 06/02/2021 0951   fentaNYL infusion INTRAVENOUS 100 mcg/hr (May 25, 2021 1420)   PRN Meds:.antiseptic oral rinse, fentaNYL, glycopyrrolate **OR** glycopyrrolate **OR** glycopyrrolate, haloperidol lactate, LORazepam **OR** LORazepam **OR** LORazepam, midazolam, ondansetron **OR** ondansetron (ZOFRAN) IV,  polyvinyl alcohol Allergies  Allergen Reactions   Codeine Nausea And Vomiting and Other (See Comments)    Reports it makes me crazy.    Review of Systems  Unable to perform ROS: Intubated   Physical Exam Constitutional:      General: He is not in acute distress.    Comments: Unresponsive to deep sternal rub  Pulmonary:     Comments: Full vent support Abdominal:     General: There is distension.  Skin:    Coloration: Skin is jaundiced.    Vital Signs: BP (!) 101/52 (BP Location: Left Arm)   Pulse 94   Temp 98.8 F (37.1 C) (Axillary)   Resp 17   Ht 6' 1"  (1.854 m)   Wt 93.6 kg   SpO2 94%   BMI 27.22 kg/m  Pain Scale: CPOT   Pain Score: 10-Worst pain ever   SpO2: SpO2: 94 % O2 Device:SpO2: 94 % O2 Flow Rate: .   IO: Intake/output summary:  Intake/Output Summary (Last 24 hours) at 05-25-2021 1435 Last data filed at 05-25-21 1400 Gross per 24 hour  Intake 2240.71 ml  Output  1383 ml  Net 857.71 ml    LBM: Last BM Date:  (PTA) Baseline Weight: Weight: 88.5 kg Most recent weight: Weight: 93.6 kg     Palliative Assessment/Data: PPS 10%    Time Total: 60 minutes Greater than 50%  of this time was spent counseling and coordinating care related to the above assessment and plan.  Juel Burrow, DNP, AGNP-C Palliative Medicine Team [phone] Pager: [phone]

## 2021-06-04 NOTE — Progress Notes (Signed)
Patient extubated to room air and comfort measure per MD order.

## 2021-06-04 NOTE — Progress Notes (Addendum)
Nutrition Brief Note  Chart reviewed. Per RT note, pt extubated to room air and transitioning to comfort care.  No further nutrition interventions planned at this time.  Please re-consult as needed.    Gustavus Bryant, MS, RD, LDN Inpatient Clinical Dietitian Please see AMiON for contact information.

## 2021-06-04 NOTE — Progress Notes (Signed)
Critical care attending attestation note:  Patient seen and examined and relevant ancillary tests reviewed.  I agree with the assessment and plan of care as outlined by Gladstone Pih, NP .   Synopsis of assessment and plan:  55 year old man who continues to be critically ill due persistent distributive shock, encephalopathy and respiratory failure due to decompensated end-stage liver disease.  While transaminases are improving the patient remains coagulopathic and he is now becoming hypoglycemic.   There is no realistic exit strategy. Palliative care has spoken to the family and they have agreed to a transition to comfort care.   CRITICAL CARE Performed by: Kipp Brood   Total critical care time: 35 minutes  Critical care time was exclusive of separately billable procedures and treating other patients.  Critical care was necessary to treat or prevent imminent or life-threatening deterioration.  Critical care was time spent personally by me on the following activities: development of treatment plan with patient and/or surrogate as well as nursing, discussions with consultants, evaluation of patient's response to treatment, examination of patient, obtaining history from patient or surrogate, ordering and performing treatments and interventions, ordering and review of laboratory studies, ordering and review of radiographic studies, pulse oximetry, re-evaluation of patient's condition and participation in multidisciplinary rounds.  Kipp Brood, MD [facility] ICU Physician [place]  Pager: [phone] Mobile: [phone] After hours: [phone].  06/13/21, 1:46 PM

## 2021-06-04 NOTE — Discharge Summary (Signed)
DEATH SUMMARY   Patient Details  Name: [name] MRN: [id number] DOB: Jan 14, 1966  Admission/Discharge Information   Admit Date:  May 22, 2021  Date of Death: Date of Death: 2021/05/27  Time of Death: Time of Death: Nov 21, 2008  Length of Stay: 5  Referring Physician: Cher Nakai, MD   Reason(s) for Hospitalization  Sepsis.   Diagnoses  Preliminary cause of death: fulminant hepatic failure. Secondary Diagnoses (including complications and co-morbidities):  Principal Problem:   Severe sepsis ([place]) Active Problems:   Seizures ([facility])   Chronic liver disease and cirrhosis ([facility])   History of alcohol abuse   Acute hepatic encephalopathy   Shock ([place])   Endotracheally intubated   Malnutrition of moderate degree   Liver failure with hepatic coma [facility])   Brief Hospital Course (including significant findings, care, treatment, and services provided and events leading to death)  [name] is a 55 y.o. year old male who presented via EMS.  Was found minimally responsive and hypothermic in his home.  History of longstanding alcoholic cirrhosis complicated by hepatocellular carcinoma.  For hypotension and worsening mental status for which she was intubated.  Eventually grew strep mitis in the blood.  Source remains unclear.  Developed worsening LFTs and coagulopathy with no improvement over time.  Situation was discussed with family with the assistance of palliative care.  It became apparent that we were going beyond the patient's previously stated wishes with a minimal chance of recovery.  The patient was therefore transition to comfort care and passed away without pain.    Pertinent Labs and Studies  Significant Diagnostic Studies CT ABDOMEN PELVIS WO CONTRAST  Result Date: 05/21/2021 CLINICAL DATA:  Abdominal distension. EXAM: CT ABDOMEN AND PELVIS WITHOUT CONTRAST TECHNIQUE: Multidetector CT imaging of the abdomen and pelvis was performed following the standard protocol without  IV contrast. COMPARISON:  Chest radiograph, earlier same day. CT abdomen pelvis, 05/09/2021 and 12/24/2014. FINDINGS: Lower chest: Bibasilar and dependent pulmonary opacities, likely consistent with atelectasis. Central venous catheter with tip terminating at the superior cavoatrial junction. Hepatobiliary: Contacted, dense appearance of the liver with gross nodule contour consistent with cirrhosis. Nodule, exophytic RIGHT hepatic lobe mass consistent with known hepatoma is poorly evaluated. Radiodense gallstones within the nondistended gallbladder. No intra-or extrahepatic biliary dilation. Pancreas: Normal noncontrast appearance. Spleen: Splenomegaly, measuring up to 15 cm. Splenic artery calcifications. Massive splenorenal shunt Adrenals/Urinary Tract: RIGHT adrenal gland is unremarkable. LEFT adrenal gland is obscured, and best appreciated on recent contrasted CT comparison. Kidneys are normal, without renal calculi, focal lesion, or hydronephrosis. Bladder is unremarkable. Stomach/Bowel: *Enteric feeding tube with tip within stomach. Radiodense layering contrast within stomach. No extraluminal extravasation. *Punctate radiodensities scattered within bowel, likely ingested. *Mild distal small bowel and cecal/proximal colonic wall thickening, this is significantly improved since 05/09/2021 comparison *Nonobstructed small bowel.  Nondilated colon. Vascular/Lymphatic: No significant vascular findings are present. No enlarged abdominal or pelvic lymph nodes. Reproductive: Prostate is unremarkable.  LEFT ascitic hydrocele. Other: A small-to-moderate volume of intra-ascites is present. Musculoskeletal: Subcutaneous edema, likely secondary to fluid overload and/or resuscitation. LEFT hip arthroplasty. Multilevel degenerative changes spine, greatest within the lower lumbar spine. No interval osseous abnormality. IMPRESSION: 1. Nonobstructed bowel, with mild distal small bowel and proximal colonic wall thickening.  Findings are much improved since recent comparison 05/09/2021, and likely to represent mild portal enteropathy/colopathy in the setting of cirrhosis. 2. Small to moderate volume intra-abdominal ascites. 3. Cirrhosis with liver mass, consistent with known HCC, and evidence of portal hypertension. Additional chronic and  senescent changes, as above. Electronically Signed   By: Michaelle Birks M.D.   On: 05/21/2021 12:44   DG Abd 1 View  Result Date: 05/23/2021 CLINICAL DATA:  NG tube placement EXAM: ABDOMEN - 1 VIEW COMPARISON:  09/18/2010 FINDINGS: 1118 hours. NG tube tip is in the mid stomach with proximal side port at the level of the GE junction. Endotracheal tube tip is seen 2.2 cm above the base of the carina. Central line tip overlies the distal SVC level. Cluster of stones in the right upper quadrant are consistent with gallstones seen on the 05/09/2021 CT scan. Diffuse gaseous bowel distention noted in the upper abdomen. IMPRESSION: 1. NG tube tip is in the mid stomach with proximal side port at the level of the GE junction. Tube could be advanced 5-6 cm to place the side port below the GE junction, as clinically warranted. 2. Diffuse gaseous bowel distention. 3. Cholelithiasis. Electronically Signed   By: Misty Stanley M.D.   On: 05/26/2021 11:57   CT HEAD WO CONTRAST (5MM)  Result Date: 05/21/2021 CLINICAL DATA:  Delirium; Neck trauma, intoxicated or obtunded (Age >= 16y) EXAM: CT HEAD WITHOUT CONTRAST CT CERVICAL SPINE WITHOUT CONTRAST TECHNIQUE: Multidetector CT imaging of the head and cervical spine was performed following the standard protocol without intravenous contrast. Multiplanar CT image reconstructions of the cervical spine were also generated. COMPARISON:  July 21, 2019. FINDINGS: CT HEAD FINDINGS Brain: No evidence of acute infarction, hemorrhage, hydrocephalus, extra-axial collection or mass lesion/mass effect. Chronic similar brainstem atrophy. Mild patchy white matter  hypoattenuation, nonspecific but compatible with chronic microvascular ischemic disease. Vascular: No hyperdense vessel identified. Skull: No acute fracture. Sinuses/Orbits: Visualized sinuses are clear. No acute orbital findings. Other: No mastoid effusions. CT CERVICAL SPINE FINDINGS Alignment: Similar slight anterolisthesis of C4 on C5 and C7 on T1. Otherwise, no substantial sagittal subluxation. Rotation of C1 on C2. Skull base and vertebrae: Similar vertebral body heights. Evidence of acute fracture. Soft tissues and spinal canal: No prevertebral fluid or swelling. No visible canal hematoma. Disc levels: Severe facet arthropathy on the left at C2-C3 and C3-C4. Severe degenerative disease at C3-C4 and moderate to severe degenerative disc disease at C5-C6 and C6-C7. No evidence of high-grade bony canal stenosis. Upper chest: Clear visualized lung apices. IMPRESSION: CT Head: 1. No evidence of acute intracranial abnormality. 2. Similar chronic findings. CT Cervical Spine: 1. No evidence of acute fracture. 2. Rotation of C1 on C2, likely positional in the absence of a fixed torticollis. 3. Multilevel severe degenerative change, as detailed above. Electronically Signed   By: Margaretha Sheffield M.D.   On: 05/07/2021 17:58   CT HEAD WO CONTRAST (5MM)  Result Date: 05/09/2021 CLINICAL DATA:  Delirium, altered mental status EXAM: CT HEAD WITHOUT CONTRAST TECHNIQUE: Contiguous axial images were obtained from the base of the skull through the vertex without intravenous contrast. COMPARISON:  07/21/2019, 11/29/2008 FINDINGS: Brain: Stable mild atrophy pattern. No acute intracranial hemorrhage, mass lesion, acute infarction, midline shift, herniation, hydrocephalus, or extra-axial fluid collection. Cisterns are patent. Cerebellar atrophy as well. Vascular: No hyperdense vessel or unexpected calcification. Skull: Normal. Negative for fracture or focal lesion. Sinuses/Orbits: No acute finding. Other: None. IMPRESSION:  Stable atrophy pattern. No acute intracranial abnormality by noncontrast CT. Electronically Signed   By: Jerilynn Mages.  Shick M.D.   On: 05/09/2021 14:48   CT Cervical Spine Wo Contrast  Result Date: 05/18/2021 CLINICAL DATA:  Delirium; Neck trauma, intoxicated or obtunded (Age >= 16y) EXAM: CT HEAD WITHOUT CONTRAST  CT CERVICAL SPINE WITHOUT CONTRAST TECHNIQUE: Multidetector CT imaging of the head and cervical spine was performed following the standard protocol without intravenous contrast. Multiplanar CT image reconstructions of the cervical spine were also generated. COMPARISON:  July 21, 2019. FINDINGS: CT HEAD FINDINGS Brain: No evidence of acute infarction, hemorrhage, hydrocephalus, extra-axial collection or mass lesion/mass effect. Chronic similar brainstem atrophy. Mild patchy white matter hypoattenuation, nonspecific but compatible with chronic microvascular ischemic disease. Vascular: No hyperdense vessel identified. Skull: No acute fracture. Sinuses/Orbits: Visualized sinuses are clear. No acute orbital findings. Other: No mastoid effusions. CT CERVICAL SPINE FINDINGS Alignment: Similar slight anterolisthesis of C4 on C5 and C7 on T1. Otherwise, no substantial sagittal subluxation. Rotation of C1 on C2. Skull base and vertebrae: Similar vertebral body heights. Evidence of acute fracture. Soft tissues and spinal canal: No prevertebral fluid or swelling. No visible canal hematoma. Disc levels: Severe facet arthropathy on the left at C2-C3 and C3-C4. Severe degenerative disease at C3-C4 and moderate to severe degenerative disc disease at C5-C6 and C6-C7. No evidence of high-grade bony canal stenosis. Upper chest: Clear visualized lung apices. IMPRESSION: CT Head: 1. No evidence of acute intracranial abnormality. 2. Similar chronic findings. CT Cervical Spine: 1. No evidence of acute fracture. 2. Rotation of C1 on C2, likely positional in the absence of a fixed torticollis. 3. Multilevel severe degenerative  change, as detailed above. Electronically Signed   By: Margaretha Sheffield M.D.   On: 05/14/2021 17:58   CT ABDOMEN PELVIS W CONTRAST  Result Date: 05/09/2021 CLINICAL DATA:  Fever and abdominal pain, postoperative. History of [place] post ablation 1 week ago. Laparoscopy 1 week ago. EXAM: CT ABDOMEN AND PELVIS WITH CONTRAST TECHNIQUE: Multidetector CT imaging of the abdomen and pelvis was performed using the standard protocol following bolus administration of intravenous contrast. CONTRAST:  45mL OMNIPAQUE IOHEXOL 350 MG/ML SOLN COMPARISON:  Abdominal ultrasound 05/09/2021. MRI abdomen 01/21/2015. FINDINGS: Lower chest: There is atelectasis in the lung bases. Hepatobiliary: Gallstones are present. There is gallbladder wall thickening/edema diffusely. There is no definite biliary ductal dilatation. There is cirrhotic liver contour. Branching hypodensity throughout the liver may represent intrahepatic biliary ductal dilatation, periportal edema or abnormal portal or hepatic veins. There are 2 hypodense lobulated heterogeneous areas in the inferior right lobe of the liver which may represent recently treated hepatic masses. The more inferior mass measures 5.2 by 4.5 cm. The mass located more superiorly measures 4.8 by 4.2 cm. Pancreas: Unremarkable. No pancreatic ductal dilatation or surrounding inflammatory changes. Spleen: Mildly enlarged, similar to the prior study. Adrenals/Urinary Tract: The adrenal glands are within normal limits. There is no hydronephrosis or perinephric fluid. There is a rounded hypodensity in the right kidney which is too small to characterize likely cyst. The bladder is grossly within normal limits, but evaluation of the bladder is limited secondary to streak artifact in the pelvis. Stomach/Bowel: There is marked wall thickening of the ascending colon and transverse colon. The appendix appears within normal limits. The distal colon appears within normal limits. There is also some wall  thickening of small bowel loops throughout the mid abdomen which are mildly dilated measuring up to 3.2 cm. Questionable pneumatosis identified within left-sided small bowel loops image 3/57. There is a small amount of free air in the upper abdomen. Vascular/Lymphatic: Aorta and IVC are normal in size. Splenic varices and large splenorenal shunt are again noted. Superior mesenteric vein grossly patent. Portal vein is not visualized and portal vein thrombosis is not excluded. Reproductive: Prostate is unremarkable.  Other: There is a moderate amount of ascites. There is diffuse body wall edema. Large fluid collection visualized in the left scrotal region. There is a small umbilical hernia containing fat and air. Bilateral gynecomastia. Musculoskeletal: The bones are diffusely osteopenic. There is an acute or subacute fracture of the superior endplate of L4 with 50% loss vertebral body height. There is minimal retropulsion of fracture fragments. There is trace compression deformity of the superior endplate of V69 which appears chronic, but is age indeterminate left hip arthroplasty is present. IMPRESSION: 1. There is free air in the upper abdomen. This may be related to recent laparoscopy; however, given other abnormal bowel findings listed below, bowel perforation cannot be excluded. 2. Marked wall thickening of the ascending colon and transverse colon worrisome for nonspecific colitis including infectious, inflammatory and ischemic etiologies. 3. Wall thickening and mild dilatation of small-bowel loops in the central abdomen with questionable pneumatosis. Findings may represent ischemic enteritis. Other etiologies for enteritis not excluded. 4. Moderate amount of ascites. 5. Findings compatible with cirrhosis and portal hypertension. There are 2 lesions in the right lobe of the liver likely related to recent treated cancer. 6. Cholelithiasis. There is diffuse gallbladder wall edema which may be reactive secondary to  2 ascites/hepatocellular disease; however, cholecystitis cannot be excluded. 7. The portal vein is not opacified. Portal vein thrombosis is not excluded. 8. Splenomegaly and varices appear grossly unchanged compatible with portal systemic hypertension. 9. Acute or subacute compression fracture superior endplate of L4. 10. Large amount of left scrotal fluid. Recommend clinical correlation and follow-up. 11. These results were called by telephone at the time of interpretation on 05/09/2021 at 10:02 pm to provider Dr. Regenia Skeeter, who verbally acknowledged these results. Electronically Signed   By: Ronney Asters M.D.   On: 05/09/2021 22:06   DG Chest Port 1 View  Result Date: 05/21/2021 CLINICAL DATA:  Shortness of breath.  Altered mental status. EXAM: PORTABLE CHEST 1 VIEW COMPARISON:  May 20, 2021 FINDINGS: The ET tube, NG tube, right central line, and left central line are stable and in good position. A right PICC line continues to terminate in the right upper extremity, stable. No pneumothorax. Bibasilar opacities remain, platelike in appearance. No other interval changes. IMPRESSION: 1. The right PICC line terminates in the right upper extremity. 2. Other support apparatus is in good position. 3. Platelike opacities in the bases favored to represent atelectasis. Recommend attention on follow-up. These results will be called to the ordering clinician or representative by the Radiologist Assistant, and communication documented in the PACS or [employer]. Electronically Signed   By: Dorise Bullion III M.D.   On: 05/21/2021 08:20   DG CHEST PORT 1 VIEW  Result Date: 05/13/2021 CLINICAL DATA:  Central line placement EXAM: PORTABLE CHEST 1 VIEW COMPARISON:  05/10/2021, 11:03 a.m. FINDINGS: Interval placement of right upper extremity PICC, tip positioned over the lower superior vena cava. Interval placement of esophagogastric tube, tip and side port below the diaphragm. Otherwise unchanged examination,  support apparatus including endotracheal tube and left neck vascular catheter. Bandlike atelectasis or consolidation of the bilateral mid lungs. The heart and mediastinum are unremarkable. IMPRESSION: 1. Interval placement of right upper extremity PICC, tip positioned over the lower superior vena cava. 2. Interval placement of esophagogastric tube, tip and side port below the diaphragm. 3. Otherwise unchanged examination, support apparatus including endotracheal tube and left neck vascular catheter. Electronically Signed   By: Eddie Candle M.D.   On: 05/05/2021 16:35  DG CHEST PORT 1 VIEW  Result Date: 05/28/2021 CLINICAL DATA:  Intubation. EXAM: PORTABLE CHEST 1 VIEW COMPARISON:  Earlier same day FINDINGS: 1103 hours. Endotracheal tube tip is 2.1 cm above the base of the carina. Left IJ central line tip overlies the mid to distal SVC level. Patchy bilateral nodular and flame shaped airspace opacities are again noted. Low lung volumes. No edema or pleural effusion. The cardiopericardial silhouette is within normal limits for size. Telemetry leads overlie the chest. IMPRESSION: 1. Endotracheal tube tip is 2.1 cm above the base of the carina. 2. Patchy bilateral nodular and flame shaped airspace disease, likely multifocal pneumonia. Electronically Signed   By: Misty Stanley M.D.   On: 05/18/2021 12:22   DG CHEST PORT 1 VIEW  Result Date: 05/27/2021 CLINICAL DATA:  Central line placement EXAM: PORTABLE CHEST 1 VIEW COMPARISON:  05/09/2021 FINDINGS: Left central line tip in the SVC. No pneumothorax. Patchy bilateral lower lobe opacities, new since prior study. Heart is normal size. No effusions or acute bony abnormality. IMPRESSION: Left central line tip in the SVC.  No pneumothorax. Patchy bilateral lower lobe opacities could reflect atelectasis or infiltrates. Electronically Signed   By: Rolm Baptise M.D.   On: 05/23/2021 10:25   DG Chest Portable 1 View  Result Date: 05/09/2021 CLINICAL DATA:  Altered  mental status, history cirrhosis EXAM: PORTABLE CHEST 1 VIEW COMPARISON:  Portable exam 1345 hours compared to 07/21/2019 FINDINGS: Normal heart size, mediastinal contours, and pulmonary vascularity. Lungs clear. No infiltrate, pleural effusion, or pneumothorax. No acute osseous findings. IMPRESSION: No acute abnormalities. Electronically Signed   By: Lavonia Styles M.D.   On: 05/09/2021 14:03   EEG adult  Result Date: 05/17/2021 Lora Havens, MD     05/29/2021  9:09 AM Patient Name: [name] MRN: [id number] Epilepsy Attending: Lora Havens Referring Physician/Provider: Dr Derrick Ravel Date: 05/12/2021 Duration: 24.14 mins Patient history: 55 year old male with history of seizures, now with altered mental status.  EEG to evaluate for seizures. Level of alertness: Awake AEDs during EEG study: LEV Technical aspects: This EEG study was done with scalp electrodes positioned according to the 10-20 International system of electrode placement. Electrical activity was acquired at a sampling rate of 500Hz  and reviewed with a high frequency filter of 70Hz  and a low frequency filter of 1Hz . EEG data were recorded continuously and digitally stored. Description: No clear posterior dominant rhythm was seen.  EEG showed continuous generalized and lateralized right hemisphere 3 to 5 Hz theta-delta slowing with overriding 15 to 18 Hz beta activity in left hemisphere. Physiologic photic driving was not seen during photic stimulation.  Sharp transients were also noted in right temporal region.  Hyperventilation was not performed.   ABNORMALITY -Continued slow, generalized and lateralized right hemisphere IMPRESSION: This study is suggestive of cortical dysfunction in right hemisphere likely secondary to underlying structural abnormality, postictal state. Additionally there is moderate diffuse encephalopathy, nonspecific etiology. No seizures or definite epileptiform discharges were seen throughout the recording.  If concern for ictal-interictal activity persists, please consider long-term monitoring. Lora Havens   ECHOCARDIOGRAM COMPLETE  Result Date: 05/30/2021    ECHOCARDIOGRAM REPORT   Patient Name:   [name] Date of Exam: 05/30/2021 Medical Rec #:  [id number]          Height:       73.0 in Accession #:    [id number]         Weight:       195.0  lb Date of Birth:  Oct 23, 1965          BSA:          2.129 m Patient Age:    31 years           BP:           125/49 mmHg Patient Gender: M                  HR:           114 bpm. Exam Location:  Inpatient Procedure: 2D Echo, Cardiac Doppler and Color Doppler Indications:    R00.8  History:        Patient has no prior history of Echocardiogram examinations.                 Signs/Symptoms:Chest Pain.  Sonographer:    MH Referring Phys: [place]  1. Left ventricular ejection fraction, by estimation, is 70 to 75%. The left ventricle has hyperdynamic function. The left ventricle has no regional wall motion abnormalities. There is mild concentric left ventricular hypertrophy. Left ventricular diastolic parameters were normal.  2. Right ventricular systolic function is hyperdynamic. The right ventricular size is normal.  3. The mitral valve is normal in structure. No evidence of mitral valve regurgitation.  4. The aortic valve is tricuspid. Aortic valve regurgitation is not visualized. No aortic stenosis is present. Increased gradient related to hyperdynamic LV function. Comparison(s): No prior Echocardiogram. FINDINGS  Left Ventricle: Left ventricular ejection fraction, by estimation, is 70 to 75%. The left ventricle has hyperdynamic function. The left ventricle has no regional wall motion abnormalities. The left ventricular internal cavity size was small. There is mild concentric left ventricular hypertrophy. Left ventricular diastolic parameters were normal. Right Ventricle: The right ventricular size is normal. No increase in right ventricular  wall thickness. Right ventricular systolic function is hyperdynamic. Left Atrium: Left atrial size was normal in size. Right Atrium: Right atrial size was normal in size. Pericardium: There is no evidence of pericardial effusion. Mitral Valve: The mitral valve is normal in structure. No evidence of mitral valve regurgitation. Tricuspid Valve: The tricuspid valve is grossly normal. Tricuspid valve regurgitation is trivial. No evidence of tricuspid stenosis. Aortic Valve: The aortic valve is tricuspid. Aortic valve regurgitation is not visualized. No aortic stenosis is present. Aortic valve area, by VTI measures 4.50 cm. Pulmonic Valve: The pulmonic valve was not well visualized. Pulmonic valve regurgitation is not visualized. No evidence of pulmonic stenosis. Aorta: The aortic root and ascending aorta are structurally normal, with no evidence of dilitation. IAS/Shunts: The interatrial septum was not well visualized.  LEFT VENTRICLE PLAX 2D LVIDd:         2.40 cm     Diastology LVIDs:         1.70 cm     LV e' medial:    11.00 cm/s LV PW:         1.20 cm     LV E/e' medial:  7.3 LV IVS:        1.30 cm     LV e' lateral:   15.30 cm/s LVOT diam:     2.30 cm     LV E/e' lateral: 5.2 LV SV:         169 LV SV Index:   79 LVOT Area:     4.15 cm  LV Volumes (MOD) LV vol d, MOD A4C: 43.9 ml LV vol s, MOD A4C: 18.4 ml LV  SV MOD A4C:     43.9 ml RIGHT VENTRICLE RV S prime:     24.10 cm/s TAPSE (M-mode): 2.6 cm LEFT ATRIUM             Index       RIGHT ATRIUM           Index LA diam:        2.70 cm 1.27 cm/m  RA Area:     15.70 cm LA Vol (A2C):   62.7 ml 29.46 ml/m RA Volume:   40.20 ml  18.89 ml/m LA Vol (A4C):   51.8 ml 24.34 ml/m LA Biplane Vol: 61.5 ml 28.89 ml/m  AORTIC VALVE                    PULMONIC VALVE AV Area (Vmean):   3.43 cm     PV Vmax:       1.46 m/s AV Area (VTI):     4.50 cm     PV Peak grad:  8.5 mmHg AV Vmean:          190.000 cm/s AV VTI:            0.376 m LVOT Vmax:         238.00 cm/s LVOT  Vmean:        157.000 cm/s LVOT VTI:          0.407 m LVOT/AV VTI ratio: 1.08  AORTA Ao Root diam: 2.80 cm Ao Asc diam:  3.00 cm MITRAL VALVE               TRICUSPID VALVE MV Area (PHT): 4.40 cm    TR Peak grad:   17.0 mmHg MV E velocity: 79.80 cm/s  TR Vmax:        206.00 cm/s MV A velocity: 87.90 cm/s MV E/A ratio:  0.91        SHUNTS                            Systemic VTI:  0.41 m                            Systemic Diam: 2.30 cm Rudean Haskell MD Electronically signed by Rudean Haskell MD Signature Date/Time: 05/09/2021/1:18:03 PM    Final    US Abdomen Limited RUQ (LIVER/GB)  Result Date: 05/13/2021 CLINICAL DATA:  Elevated LFTs. EXAM: ULTRASOUND ABDOMEN LIMITED RIGHT UPPER QUADRANT COMPARISON:  CT abdomen and pelvis 05/09/2021. FINDINGS: Gallbladder: Multiple gallstones are present measuring up to 1.8 cm. There is gallbladder wall thickening measuring 8.6 mm. Sonographic Percell Miller sign is negative per sonographer. There is trace pericholecystic fluid. Common bile duct: Diameter: 4.2 mm. Liver: There is a nodular liver contour. 2 masses are identified in the right lobe of the liver as seen on recent CT. The more anterior mass measures 4.0 x 3.5 x 3.5 cm. The more posterior mass measures 5.8 x 4.3 x 4.7 cm. Portal vein is patent on color Doppler imaging with normal direction of blood flow towards the liver. Other: There is moderate ascites in the upper abdomen. There is some soft tissue thickening and nodularity within the ascites anterior to the liver. IMPRESSION: 1. Cholelithiasis with gallbladder wall thickening and pericholecystic fluid. Sonographic Percell Miller sign is negative and there is no biliary ductal dilatation. Acute or chronic cholecystitis cannot be excluded. 2. There are 2 liver masses, grossly unchanged. 3.  Moderate ascites. There is soft tissue nodularity within the ascites which may be related to peritoneal carcinomatosis. Electronically Signed   By: Ronney Asters M.D.   On: 05/13/2021  17:33   US Abdomen Limited RUQ (LIVER/GB)  Result Date: 05/09/2021 CLINICAL DATA:  Right upper quadrant pain. Altered mental status. History of [place] post ablation 1 week ago. EXAM: ULTRASOUND ABDOMEN LIMITED RIGHT UPPER QUADRANT COMPARISON:  No recent imaging. Most recent CT available 12/24/2014. FINDINGS: Gallbladder: Shadowing gallstones. Diffuse wall thickening up to 7 mm. No sonographic Murphy sign noted by sonographer. Common bile duct: Diameter: Not well visualized, but likely 3-4 mm. Liver: Cirrhotic hepatic morphology with nodular contours and diffusely increased parenchymal echogenicity. There are 2 hypoechoic lesions, 1 in the anterior right lobe measuring 3.8 x 3.7 x 2.8 cm. Additional lesion in the posterior right lobe measures 3.9 x 3.1 x 3.6 cm. Nonspecific rind of increased echogenicity in the periphery of the right lobe adjacent to 1 of these liver lesions. Portal vein is patent on color Doppler imaging with reversal of blood flow away from the liver. Other: Right upper quadrant ascites, with fluid appearing relatively simple. IMPRESSION: 1. Hepatic cirrhosis. Two hypodense liver lesions. One of these lesions has a nonspecific rind of increased echogenicity adjacent which is nonspecific by ultrasound. There is no interval comparison imaging since 2016, and findings are nonspecific. Given clinical concern for procedure complications such as bleeding, recommend abdominopelvic CT (with IV contrast in the absence of contraindication). 2. Right upper quadrant ascites, appears simple. 3. Gallstones. Diffuse gallbladder wall thickening, nonspecific but likely due to chronic liver disease. 4. Reversal of blood flow in the main portal vein. Electronically Signed   By: Keith Rake M.D.   On: 05/09/2021 18:20   IR Paracentesis  Result Date: 05/10/2021 INDICATION: Patient with a history of cirrhosis, hepatocellular carcinoma and recurrent ascites since today for diagnostic and therapeutic paracentesis.  EXAM: ULTRASOUND GUIDED PARACENTESIS MEDICATIONS: 1% lidocaine 10 mL COMPLICATIONS: None immediate. PROCEDURE: Informed written consent was obtained from the patient after a discussion of the risks, benefits and alternatives to treatment. A timeout was performed prior to the initiation of the procedure. Initial ultrasound scanning demonstrates a large amount of ascites within the left lower abdominal quadrant. The left lower abdomen was prepped and draped in the usual sterile fashion. 1% lidocaine was used for local anesthesia. Following this, a 19 gauge, 7-cm, Yueh catheter was introduced. An ultrasound image was saved for documentation purposes. The paracentesis was performed. The catheter was removed and a dressing was applied. The patient tolerated the procedure well without immediate post procedural complication. FINDINGS: A total of approximately 2.8 L of yellow fluid was removed. Samples were sent to the laboratory as requested by the clinical team. IMPRESSION: Successful ultrasound-guided paracentesis yielding 2.8 liters of peritoneal fluid. Read by: Soyla Dryer, NP Electronically Signed   By: Corrie Mckusick D.O.   On: 05/10/2021 16:35    Microbiology Recent Results (from the past 240 hour(s))  Resp Panel by RT-PCR (Flu A&B, Covid) Nasopharyngeal Swab     Status: None   Collection Time: 05/31/2021  4:24 PM   Specimen: Nasopharyngeal Swab; Nasopharyngeal(NP) swabs in vial transport medium  Result Value Ref Range Status   SARS Coronavirus 2 by RT PCR NEGATIVE NEGATIVE Final    Comment: (NOTE) SARS-CoV-2 target nucleic acids are NOT DETECTED.  The SARS-CoV-2 RNA is generally detectable in upper respiratory specimens during the acute phase of infection. The lowest concentration of SARS-CoV-2 viral copies this  assay can detect is 138 copies/mL. A negative result does not preclude SARS-Cov-2 infection and should not be used as the sole basis for treatment or other patient management decisions. A  negative result may occur with  improper specimen collection/handling, submission of specimen other than nasopharyngeal swab, presence of viral mutation(s) within the areas targeted by this assay, and inadequate number of viral copies(<138 copies/mL). A negative result must be combined with clinical observations, patient history, and epidemiological information. The expected result is Negative.  Fact Sheet for Patients:  EntrepreneurPulse.com.au  Fact Sheet for Healthcare Providers:  IncredibleEmployment.be  This test is no t yet approved or cleared by the [place] FDA and  has been authorized for detection and/or diagnosis of SARS-CoV-2 by FDA under an Emergency Use Authorization (EUA). This EUA will remain  in effect (meaning this test can be used) for the duration of the COVID-19 declaration under Section 564(b)(1) of the Act, 21 U.S.C.section 360bbb-3(b)(1), unless the authorization is terminated  or revoked sooner.       Influenza A by PCR NEGATIVE NEGATIVE Final   Influenza B by PCR NEGATIVE NEGATIVE Final    Comment: (NOTE) The Xpert Xpress SARS-CoV-2/FLU/RSV plus assay is intended as an aid in the diagnosis of influenza from Nasopharyngeal swab specimens and should not be used as a sole basis for treatment. Nasal washings and aspirates are unacceptable for Xpert Xpress SARS-CoV-2/FLU/RSV testing.  Fact Sheet for Patients: EntrepreneurPulse.com.au  Fact Sheet for Healthcare Providers: IncredibleEmployment.be  This test is not yet approved or cleared by the [place] FDA and has been authorized for detection and/or diagnosis of SARS-CoV-2 by FDA under an Emergency Use Authorization (EUA). This EUA will remain in effect (meaning this test can be used) for the duration of the COVID-19 declaration under Section 564(b)(1) of the Act, 21 U.S.C. section 360bbb-3(b)(1), unless the authorization  is terminated or revoked.  Performed at [facility], [place] [address], [place], [place] [address]   Blood culture (routine x 2)     Status: Abnormal   Collection Time: 05/25/2021  7:50 PM   Specimen: BLOOD  Result Value Ref Range Status   Specimen Description BLOOD BLOOD LEFT HAND  Final   Special Requests   Final    BOTTLES DRAWN AEROBIC AND ANAEROBIC Blood Culture adequate volume   Culture  Setup Time   Final    GRAM POSITIVE COCCI IN CLUSTERS IN CHAINS IN BOTH AEROBIC AND ANAEROBIC BOTTLES CRITICAL RESULT CALLED TO, READ BACK BY AND VERIFIED WITHLana Fish [facility] 1025 05/31/2021 A BROWNING Performed at [facility], [place] [address] [place], [place] [address]    Culture STREPTOCOCCUS MITIS/ORALIS (A)  Final   Report Status 05/25/2021 FINAL  Final   Organism ID, Bacteria STREPTOCOCCUS MITIS/ORALIS  Final      Susceptibility   Streptococcus mitis/oralis - MIC*    TETRACYCLINE 0.5 SENSITIVE Sensitive     VANCOMYCIN 0.5 SENSITIVE Sensitive     CLINDAMYCIN <=0.25 SENSITIVE Sensitive     PENICILLIN Value in next row Sensitive      SENSITIVEMIC =0.06S    * STREPTOCOCCUS MITIS/ORALIS  Blood Culture ID Panel (Reflexed)     Status: Abnormal   Collection Time: 05/29/2021  7:50 PM  Result Value Ref Range Status   Enterococcus faecalis NOT DETECTED NOT DETECTED Final   Enterococcus Faecium NOT DETECTED NOT DETECTED Final   Listeria monocytogenes NOT DETECTED NOT DETECTED Final   Staphylococcus species NOT DETECTED NOT DETECTED Final  Staphylococcus aureus (BCID) NOT DETECTED NOT DETECTED Final   Staphylococcus epidermidis NOT DETECTED NOT DETECTED Final   Staphylococcus lugdunensis NOT DETECTED NOT DETECTED Final   Streptococcus species DETECTED (A) NOT DETECTED Final    Comment: Not Enterococcus species, Streptococcus agalactiae, Streptococcus pyogenes, or Streptococcus pneumoniae. CRITICAL RESULT CALLED TO, READ BACK BY AND VERIFIED WITH: Lana Fish PHARMD 1610 06/01/2021 A  BROWNING    Streptococcus agalactiae NOT DETECTED NOT DETECTED Final   Streptococcus pneumoniae NOT DETECTED NOT DETECTED Final   Streptococcus pyogenes NOT DETECTED NOT DETECTED Final   A.calcoaceticus-baumannii NOT DETECTED NOT DETECTED Final   Bacteroides fragilis NOT DETECTED NOT DETECTED Final   Enterobacterales NOT DETECTED NOT DETECTED Final   Enterobacter cloacae complex NOT DETECTED NOT DETECTED Final   Escherichia coli NOT DETECTED NOT DETECTED Final   Klebsiella aerogenes NOT DETECTED NOT DETECTED Final   Klebsiella oxytoca NOT DETECTED NOT DETECTED Final   Klebsiella pneumoniae NOT DETECTED NOT DETECTED Final   Proteus species NOT DETECTED NOT DETECTED Final   Salmonella species NOT DETECTED NOT DETECTED Final   Serratia marcescens NOT DETECTED NOT DETECTED Final   Haemophilus influenzae NOT DETECTED NOT DETECTED Final   Neisseria meningitidis NOT DETECTED NOT DETECTED Final   Pseudomonas aeruginosa NOT DETECTED NOT DETECTED Final   Stenotrophomonas maltophilia NOT DETECTED NOT DETECTED Final   Candida albicans NOT DETECTED NOT DETECTED Final   Candida auris NOT DETECTED NOT DETECTED Final   Candida glabrata NOT DETECTED NOT DETECTED Final   Candida krusei NOT DETECTED NOT DETECTED Final   Candida parapsilosis NOT DETECTED NOT DETECTED Final   Candida tropicalis NOT DETECTED NOT DETECTED Final   Cryptococcus neoformans/gattii NOT DETECTED NOT DETECTED Final    Comment: Performed at [facility], [address], [place], [place] [address]  Blood culture (routine x 2)     Status: None   Collection Time: 05/14/2021  8:13 PM   Specimen: BLOOD LEFT HAND  Result Value Ref Range Status   Specimen Description BLOOD LEFT HAND  Final   Special Requests   Final    BOTTLES DRAWN AEROBIC AND ANAEROBIC Blood Culture results may not be optimal due to an inadequate volume of blood received in culture bottles   Culture   Final    NO GROWTH 5 DAYS Performed at [place], [place] [address] [place], [place] [address]    Report Status 06-11-21 FINAL  Final  MRSA Next Gen by PCR, Nasal     Status: None   Collection Time: 05/21/2021 11:45 AM   Specimen: Nasal Mucosa; Nasal Swab  Result Value Ref Range Status   MRSA by PCR Next Gen NOT DETECTED NOT DETECTED Final    Comment: (NOTE) The GeneXpert MRSA Assay (FDA approved for NASAL specimens only), is one component of a comprehensive MRSA colonization surveillance program. It is not intended to diagnose MRSA infection nor to guide or monitor treatment for MRSA infections. Test performance is not FDA approved in patients less than 67 years old. Performed at [facility], [place] [address] [place], [place] [address]   Body fluid culture w Gram Stain     Status: None   Collection Time: 05/25/2021 12:54 PM   Specimen: Peritoneal Fluid  Result Value Ref Range Status   Specimen Description FLUID PERITONEAL  Final   Special Requests NONE  Final   Gram Stain   Final    FEW WBC PRESENT, PREDOMINANTLY MONONUCLEAR NO ORGANISMS SEEN  Culture   Final    NO GROWTH 3 DAYS Performed at [facility], [place] [address] [place], [place] [address]    Report Status 06-13-21 FINAL  Final    Lab Basic Metabolic Panel: Recent Labs  Lab 05/21/21 0320 05/21/21 1419 05/21/21 2041 05/22/21 0309 05/22/21 0332 05/23/21 0355 05/23/21 1540 06-13-2021 0331  NA 127*   < > 129* 132* 132* 139 141 141  K 5.9*   < > 5.5* 4.6 4.5 2.6* 3.0* 3.5  CL 97*   < > 95* 95*  --  95* 97* 97*  CO2 12*   < > 17* 19*  --  32 31 30  GLUCOSE 81   < > 137* 150*  --  143* 116* 98  BUN 29*   < > 33* 31*  --  23* 26* 31*  CREATININE 2.25*   < > 2.38* 2.13*  --  1.32* 1.41* 1.77*  CALCIUM 8.5*   < > 8.0* 8.2*  --  8.1* 8.0* 8.2*  MG 2.3  --   --  2.3  --  1.8  --   --   PHOS 7.0*  --   --  5.5*  --   --   --   --    < > = values in this interval not displayed.   Liver Function Tests: Recent Labs  Lab 05/21/21 0320 05/21/21 1419  05/22/21 0309 05/23/21 0355 2021-06-13 0331  AST >10,000* 7,272* 4,361* 2,159* 1,015*  ALT 3,272* 2,724* 2,353* 1,493* 885*  ALKPHOS 396* 417* 415* 395* 317*  BILITOT 19.6* 20.0* 21.5* 22.7* 26.7*  PROT 4.9* 4.8* 5.2* 4.2* 4.4*  ALBUMIN 2.7* 2.9* 3.4* 2.7* 2.7*   Recent Labs  Lab 05/18/2021 1653  LIPASE 70*   Recent Labs  Lab 05/15/2021 1653 06/03/2021 0529  AMMONIA 71* 52*   CBC: Recent Labs  Lab 05/13/2021 1836 06/03/2021 0529 05/21/21 0320 05/21/21 2021 05/21/21 2041 05/22/21 0309 05/22/21 0332 05/23/21 0355 Jun 13, 2021 0331  WBC 17.3*   < > 21.6*  --  15.3* 12.1*  --  12.7* 18.1*  NEUTROABS 13.7*  --   --   --   --   --   --   --   --   HGB 11.4*   < > 9.2*   < > 8.1* 8.2* 7.8* 8.7* 9.2*  HCT 33.4*   < > 28.1*   < > 24.4* 24.1* 23.0* 26.3* 29.0*  MCV 105.4*   < > 110.2*  --  109.4* 108.1*  --  108.2* 114.2*  PLT PLATELET CLUMPS NOTED ON SMEAR, UNABLE TO ESTIMATE   < > PLATELET CLUMPS NOTED ON SMEAR, COUNT APPEARS DECREASED  --  39* 27*  --  22* 15*   < > = values in this interval not displayed.   Cardiac Enzymes: Recent Labs  Lab 05/28/2021 1653 05/18/2021 0529 05/21/21 0320  CKTOTAL 403* 389 610*   Sepsis Labs: Recent Labs  Lab 05/17/2021 1623 05/17/2021 1836 05/29/2021 1836 05/07/2021 0529 05/31/2021 1310 06/02/2021 1715 05/21/21 0320 05/21/21 2041 05/22/21 0309 05/23/21 0355 06-13-2021 0331  PROCALCITON  --   --   --  0.25  --   --  3.36  --  0.71  --   --   WBC  --  17.3*   < > 17.8*   < >  --  21.6* 15.3* 12.1* 12.7* 18.1*  LATICACIDVEN 5.3* 4.6*  --  10.0*  --  9.1*  --   --   --   --   --    < > =  values in this interval not displayed.    Procedures/Operations  Mechanical ventilation.   Tomisha Reppucci 05/25/2021, 4:13 PM

## 2021-06-04 NOTE — Progress Notes (Signed)
Patient expired at 2010. Death verified by Pieter Partridge, RN and Jerrilyn Cairo, RN. Notified Elink. Family called to notify- [place]. Wasted 225 of fentanyl with Josue Hector, RN in stericycle.   Milford Cage, RN

## 2021-06-04 NOTE — Progress Notes (Addendum)
NAME:  [name], MRN:  [id number], DOB:  05-03-1966, LOS: 5 ADMISSION DATE:  05/27/2021, CONSULTATION DATE:  06-06-2021  REFERRING MD:  Hal Hope, CHIEF COMPLAINT: Hypotension  History of Present Illness:  55 year old man with known history of alcoholic cirrhosis and varices and seizure disorder recently discharged from the hospital 2 days ago for hepatic encephalopathy.  He states that he had a seizure and fell down the stairs.  He was found facedown in his closet brought in by EMS with GCS of 8 which improved.  Initially hypothermic to 96, initial labs showed WBC of 17 K, sodium of 124 which is chronically low and increase in creatinine from baseline 0.7-1.3 lipase was 70 total bilirubin slightly increased. Initial lactate was 5.3 and decreased to 4.6 ammonia level was 71. He was initially admitted by [facility] but then developed hypotension and peripheral Levophed was started and PCCM called for admission  Significant Hospital Events: Including procedures, antibiotic start and stop dates in addition to other pertinent events   9/15 > Admitted to [facility].  9/16 > Hypotension requiring Levophed in the early AM. Transferred to ICU, intubated, started requiring vasopressors 9/18 > improving vasopressor requirements.  9/20>> Remains on Levophed at 16 mcg/ min>> had to increase overnight  Interim History / Subjective:  Remains unresponsive. Vasopressor requirements have doubled over night . Remains on octreotide at 50 mcg/hr  and fentanyl at 50 mcg/hr Labs reviewed AST down trending 1,015 ( 2159) ALT 885 ( 1493) Albumin 2.7 Alk Phos 317 ( 395) Total Bili 26.7 ( 22.7) K 3.5, Na 141, CO2  30, Cl 97, Glucose 98, Creatinine 1.77 ( 1.41) , Calcium 8.2  WBC 18.1, ( 12.7) HGB 9.2, platelets 15,000, INR > 10  Episodes of hypoglycemia, minimal urine output, worsening renal failure Increase in WBC, low grade fever  Large TF residual ( 500 cc;s) TF are on hold.  D5.45 has been started  to maintain blood sugar levels.      Objective   Blood pressure (!) 101/39, pulse 93, temperature 99 F (37.2 C), temperature source Axillary, resp. rate 20, height _0  (1.854 m), weight 93.6 kg, SpO2 100 %.    Vent Mode: PRVC FiO2 (%):  [40 %] 40 % Set Rate:  [20 bmp] 20 bmp Vt Set:  [560 mL] 560 mL PEEP:  [5 cmH20] 5 cmH20 Plateau Pressure:  [14 cmH20-16 cmH20] 14 cmH20   Intake/Output Summary (Last 24 hours) at 06-06-2021 0916 Last data filed at 06-Jun-2021 0900 Gross per 24 hour  Intake 2208.18 ml  Output 1550 ml  Net 658.18 ml   Filed Weights   05/22/21 0500 05/23/21 0406 06-Jun-2021 0245  Weight: 99.2 kg 96.7 kg 93.6 kg   Examination: General: Critically ill-appearing male who appears older than stated age.  Orally intubated , sedated HENT: Scleral icterus with dry mucus membranes.  Scleral jaundice , OGT and ETT in place and secure Lungs:  Bilateral chest excursions, Vesicular breath sounds throughout, no wheezes or crackles, diminished per bases Cardiovascular: Regular rate and rhythm. No murmurs, rub or gallop.  Abdomen: Soft, distended, bowel sounds present, Extremities: Pitting edema of the bilateral lower extremities. jaundiced.  Severe Scrotal edema.  Neuro: Sedated, not following commands, RASS -3  Resolved Hospital Problem list    Hyperkalemia Assessment & Plan:  Septic Shock due to Streptococcus mitis bacteremia Acute Hypoxic Respiratory Failure  Shock liver superimposed on advanced cirrhosis Acute Kidney Injury could be related to hepatorenal syndrome versus sepsis High anion gap metabolic  acidosis due to high lactic acid level Upper GI Bleed, likely due to portal hypertensive gastropathy Acute metabolic/hepatic/septic encephalopathy  Decompensated Cirrhosis with ascites, portal hypertension and coagulopathy Hepatocellular carcinoma s/p ablation recently at [facility] Acute blood loss anemia Thrombocytopenia due to liver disease and septic shock Chronic  Hyponatremia  Seizure disorder Hypoglycemia despite TF  Plan:  -Continue to wean vasopressors as tolerated.  Resolution of shock physiology will be slow in the context of vasodilated cirrhotic state. -No evidence of endocarditis on surface echo.  Hold on transesophageal while INR is elevated. -Continue Unasyn as prescribed for Streptococcus mitis no evidence of abscesses. -Complete 72 hours of octreotide, continue PPI indefinitely. -Stop all sedatives allow mentation to clear. -Increase lactulose continue rifaximin for hepatic encephalopathy -Initiate SBT if mental status allows. -Limited therapeutic options at present.  Needs time for septic physiology to clear. -Improving LFT profile.   INR remains > 10 as synthetic function recovers.  If it does not then options are limited and patient unlikely to survive at that point.  Currently a DNR.  Continue with family discussions. Will consult palliative care. Concern for increase in pressor needs and hypoglycemia, decreased UO , Increase in creatinine ,  new fever and elevated WBC.    Best Practice (right click and "Reselect all SmartList Selections" daily)   Diet/type: Tube Feeds DVT prophylaxis: SCDs GI prophylaxis: PPI Lines: Central line Foley: Yes still needed Code Status:  full code Last date of multidisciplinary goals of care discussion: 9/18, patient's brother spoke with his siblings and they decided to proceed with DNR. 9/20 I have spoken with patient's brother [name] and patient's sister in [employer]. I let them know that over night Denman's pressor needs have doubled, he has a low grade fever and renal function is worsening. I also explained that while his LFT's are improving his coag status is not. His INR remains > 10. They stated the patient did not want a liver transplant, and that he did not want to be in pain. I wanted to see if they wanted to be aggressive with the new fever and worsening renal function  vs considering  palliation at  this time . [name] and his wife are going to talk with other family and make a decision. They agrees that Palliation was a good idea. The referral has been placed.  Labs   CBC: Recent Labs  Lab 05/21/2021 1836 05/05/2021 0529 05/21/21 0320 05/21/21 2021 05/21/21 2041 05/22/21 0309 05/22/21 0332 05/23/21 0355 05/27/21 0331  WBC 17.3*   < > 21.6*  --  15.3* 12.1*  --  12.7* 18.1*  NEUTROABS 13.7*  --   --   --   --   --   --   --   --   HGB 11.4*   < > 9.2*   < > 8.1* 8.2* 7.8* 8.7* 9.2*  HCT 33.4*   < > 28.1*   < > 24.4* 24.1* 23.0* 26.3* 29.0*  MCV 105.4*   < > 110.2*  --  109.4* 108.1*  --  108.2* 114.2*  PLT PLATELET CLUMPS NOTED ON SMEAR, UNABLE TO ESTIMATE   < > PLATELET CLUMPS NOTED ON SMEAR, COUNT APPEARS DECREASED  --  39* 27*  --  22* 15*   < > = values in this interval not displayed.    Basic Metabolic Panel: Recent Labs  Lab 05/21/21 0320 05/21/21 1419 05/21/21 2041 05/22/21 0309 05/22/21 0332 05/23/21 0355 05/23/21 1540 2021/05/27 0331  NA 127*   < >  129* 132* 132* 139 141 141  K 5.9*   < > 5.5* 4.6 4.5 2.6* 3.0* 3.5  CL 97*   < > 95* 95*  --  95* 97* 97*  CO2 12*   < > 17* 19*  --  32 31 30  GLUCOSE 81   < > 137* 150*  --  143* 116* 98  BUN 29*   < > 33* 31*  --  23* 26* 31*  CREATININE 2.25*   < > 2.38* 2.13*  --  1.32* 1.41* 1.77*  CALCIUM 8.5*   < > 8.0* 8.2*  --  8.1* 8.0* 8.2*  MG 2.3  --   --  2.3  --  1.8  --   --   PHOS 7.0*  --   --  5.5*  --   --   --   --    < > = values in this interval not displayed.   GFR: Estimated Creatinine Clearance: 52 mL/min (A) (by C-G formula based on SCr of 1.77 mg/dL (H)). Recent Labs  Lab 05/11/2021 1623 05/31/2021 1836 05/17/2021 1836 05/13/2021 0529 05/21/2021 1310 05/15/2021 1715 05/21/21 0320 05/21/21 2041 05/22/21 0309 05/23/21 0355 05-27-21 0331  PROCALCITON  --   --   --  0.25  --   --  3.36  --  0.71  --   --   WBC  --  17.3*   < > 17.8*   < >  --  21.6* 15.3* 12.1* 12.7* 18.1*  LATICACIDVEN 5.3* 4.6*  --  10.0*   --  9.1*  --   --   --   --   --    < > = values in this interval not displayed.    Liver Function Tests: Recent Labs  Lab 05/21/21 0320 05/21/21 1419 05/22/21 0309 05/23/21 0355 May 27, 2021 0331  AST >10,000* 7,272* 4,361* 2,159* 1,015*  ALT 3,272* 2,724* 2,353* 1,493* 885*  ALKPHOS 396* 417* 415* 395* 317*  BILITOT 19.6* 20.0* 21.5* 22.7* 26.7*  PROT 4.9* 4.8* 5.2* 4.2* 4.4*  ALBUMIN 2.7* 2.9* 3.4* 2.7* 2.7*   Recent Labs  Lab 05/22/2021 1653  LIPASE 70*   Recent Labs  Lab 05/11/2021 1653 05/12/2021 0529  AMMONIA 71* 52*    ABG    Component Value Date/Time   PHART 7.578 (H) 05/22/2021 0332   PCO2ART 23.2 (L) 05/22/2021 0332   PO2ART 104 05/22/2021 0332   HCO3 21.7 05/22/2021 0332   TCO2 22 05/22/2021 0332   ACIDBASEDEF 4.0 (H) 05/21/2021 2021   O2SAT 99.0 05/22/2021 0332     Coagulation Profile: Recent Labs  Lab 05/29/2021 0529 05/21/21 0320 05/21/21 1419 05/23/21 0355  INR 3.1* 8.1* 10.0* >10.0*    Cardiac Enzymes: Recent Labs  Lab 05/26/2021 1653 05/25/2021 0529 05/21/21 0320  CKTOTAL 403* 389 610*    HbA1C: Hgb A1c MFr Bld  Date/Time Value Ref Range Status  01/07/2013 12:00 PM 4.4 (L) 4.6 - 6.5 % Final    Comment:    Glycemic Control Guidelines for People with Diabetes:Non Diabetic:  <6%Goal of Therapy: <7%Additional Action Suggested:  >8%     CBG: Recent Labs  Lab 05/23/21 2012 05/23/21 2047 05/23/21 2344 2021/05/27 0327 27-May-2021 0729  GLUCAP 61* 127* 30 98 72    CRITICAL CARE Magdalen Spatz, MSN, AGACNP-BC [place] See Amion for personal pager PCCM on call pager [phone]    Total critical care time: 45 minutes

## 2021-06-04 NOTE — Progress Notes (Addendum)
Daily Rounding Note  05-30-21, 10:12 AM  LOS: 5 days   SUBJECTIVE:   Chief complaint:   Blood per OG tube.  Decompensated cirrhosis.   Remains altered on vent.  Low-dose fentanyl in use but off Precedex.  RN reports he did open his eyes earlier today but he is not following commands at all.  Tolerating tube feeds at 40 mL/hour.  No GI bleeding.  No BMs reported. Remains on Levophed.    OBJECTIVE:         Vital signs in last 24 hours:    Temp:  [98.3 F (36.8 C)-99.2 F (37.3 C)] 99 F (37.2 C) (09/20 0733) Pulse Rate:  [78-114] 93 (09/20 0900) Resp:  [20-21] 20 (09/20 0900) BP: (93-112)/(39-66) 101/39 (09/20 0800) SpO2:  [100 %] 100 % (09/20 0900) FiO2 (%):  [40 %] 40 % (09/20 0800) Weight:  [93.6 kg] 93.6 kg (09/20 0245) Last BM Date:  (PTA) Filed Weights   05/22/21 0500 05/23/21 0406 05/30/21 0245  Weight: 99.2 kg 96.7 kg 93.6 kg   General: Looks critically ill, jaundiced, no agitation. Heart: RRR. Chest: No labored breathing or breathing above vent Abdomen: Soft.  No tenderness.  Bowel sounds quiet.  Scrotal edema. Extremities: Slight pedal edema. Neuro/Psych: Unresponsive to exam.  No spontaneous limb, facial movement.  Intake/Output from previous day: 09/19 0701 - 09/20 0700 In: 2416.7 [I.V.:1249.8; NG/GT:628.7; IV Piggyback:538.3] Out: 7681 [Urine:1595; Emesis/NG output:250]  Intake/Output this shift: Total I/O In: 163.5 [I.V.:94.8; NG/GT:68.7] Out: 25 [Urine:25]  Lab Results: Recent Labs    05/22/21 0309 05/22/21 0332 05/23/21 0355 30-May-2021 0331  WBC 12.1*  --  12.7* 18.1*  HGB 8.2* 7.8* 8.7* 9.2*  HCT 24.1* 23.0* 26.3* 29.0*  PLT 27*  --  22* 15*   BMET Recent Labs    05/23/21 0355 05/23/21 1540 05-30-21 0331  NA 139 141 141  K 2.6* 3.0* 3.5  CL 95* 97* 97*  CO2 32 31 30  GLUCOSE 143* 116* 98  BUN 23* 26* 31*  CREATININE 1.32* 1.41* 1.77*  CALCIUM 8.1* 8.0* 8.2*    LFT Recent Labs    05/22/21 0309 05/23/21 0355 May 30, 2021 0331  PROT 5.2* 4.2* 4.4*  ALBUMIN 3.4* 2.7* 2.7*  AST 4,361* 2,159* 1,015*  ALT 2,353* 1,493* 885*  ALKPHOS 415* 395* 317*  BILITOT 21.5* 22.7* 26.7*   PT/INR Recent Labs    05/21/21 1419 05/23/21 0355  LABPROT 79.9* >90.0*  INR 10.0* >10.0*       ASSESMENT:      Decompensated cirrhosis with suspected superimposed shock liver.  Transaminases, alkaline phosphatase improving.  T bili rising.  AKI.     AMS.  Ammonia level 52 5 days ago.  Lactulose, rifaximin in place.     Severe thrombocytopenia.  Platelets now 15 K.   Coagulopathy.  Progressive elevation INR now > 10.   Likely DIC.  Patient is JW, therefore no blood products allowed.  Bleeding per NG tube.  Has not had any emesis since initiation of tube feeds.  No bowel movements reported bloody or otherwise.    PNA.     PLAN   Prognosis for this patient diffuse extremely DM.  He is already a DNR.  Nurse tells me that CCM will be discussing situation with patient's family   Octreotide 72 hours stops today.      Azucena Freed  05-30-2021, 10:12 AM Phone [place] Attending  I have taken an interval history, reviewed the chart and examined the patient. I agree with the Advanced Practitioner's note, impression and recommendations.  Majority the medical decision-making in the formulation of the assessment and plan were performed by me.   I had spoken with the brother earlier and recommended comfort care and others have recommended the same.  The patient has been placed on comfort care only status.  We will sign off.  Gatha Mayer, MD, [facility] 06/03/21 3:15 PM

## 2021-06-04 DEATH — deceased
